# Patient Record
Sex: Male | Born: 1935
Health system: Southern US, Community
[De-identification: ages and names within clinical notes are randomized; demographics above are authoritative.]

## PROBLEM LIST (undated history)

## (undated) DIAGNOSIS — I519 Heart disease, unspecified: Secondary | ICD-10-CM

## (undated) DIAGNOSIS — R350 Frequency of micturition: Secondary | ICD-10-CM

## (undated) DIAGNOSIS — R251 Tremor, unspecified: Secondary | ICD-10-CM

## (undated) DIAGNOSIS — C801 Malignant (primary) neoplasm, unspecified: Secondary | ICD-10-CM

## (undated) DIAGNOSIS — M19011 Primary osteoarthritis, right shoulder: Secondary | ICD-10-CM

## (undated) DIAGNOSIS — N401 Enlarged prostate with lower urinary tract symptoms: Secondary | ICD-10-CM

## (undated) DIAGNOSIS — M25422 Effusion, left elbow: Secondary | ICD-10-CM

## (undated) DIAGNOSIS — I10 Essential (primary) hypertension: Secondary | ICD-10-CM

## (undated) DIAGNOSIS — K219 Gastro-esophageal reflux disease without esophagitis: Secondary | ICD-10-CM

## (undated) DIAGNOSIS — N400 Enlarged prostate without lower urinary tract symptoms: Secondary | ICD-10-CM

## (undated) DIAGNOSIS — E119 Type 2 diabetes mellitus without complications: Secondary | ICD-10-CM

## (undated) DIAGNOSIS — R338 Other retention of urine: Secondary | ICD-10-CM

## (undated) DIAGNOSIS — H269 Unspecified cataract: Secondary | ICD-10-CM

## (undated) DIAGNOSIS — I219 Acute myocardial infarction, unspecified: Secondary | ICD-10-CM

## (undated) HISTORY — PX: HERNIA REPAIR: SHX51

## (undated) HISTORY — DX: Effusion, left elbow: M25.422

## (undated) HISTORY — PX: MOHS SURGERY: SUR867

## (undated) HISTORY — PX: CARDIAC CATHETERIZATION: SHX172

## (undated) HISTORY — PX: OTHER SURGICAL HISTORY: SHX169

## (undated) HISTORY — PX: PROSTATE BIOPSY: SHX241

## (undated) HISTORY — DX: Heart disease, unspecified: I51.9

## (undated) HISTORY — DX: Acute myocardial infarction, unspecified: I21.9

## (undated) HISTORY — DX: Type 2 diabetes mellitus without complications: E11.9

## (undated) HISTORY — DX: Unspecified cataract: H26.9

---

## 1999-07-05 ENCOUNTER — Other Ambulatory Visit: Admission: RE | Admit: 1999-07-05 | Discharge: 1999-07-05 | Payer: Self-pay | Admitting: Podiatry

## 2001-10-19 ENCOUNTER — Ambulatory Visit (HOSPITAL_COMMUNITY): Admission: RE | Admit: 2001-10-19 | Discharge: 2001-10-19 | Payer: Self-pay | Admitting: Gastroenterology

## 2001-10-19 ENCOUNTER — Encounter (INDEPENDENT_AMBULATORY_CARE_PROVIDER_SITE_OTHER): Payer: Self-pay | Admitting: *Deleted

## 2005-10-17 ENCOUNTER — Encounter: Admission: RE | Admit: 2005-10-17 | Discharge: 2005-10-17 | Payer: Self-pay | Admitting: Cardiology

## 2005-10-18 ENCOUNTER — Encounter: Admission: RE | Admit: 2005-10-18 | Discharge: 2005-10-18 | Payer: Self-pay | Admitting: Cardiology

## 2005-10-24 ENCOUNTER — Ambulatory Visit (HOSPITAL_COMMUNITY): Admission: RE | Admit: 2005-10-24 | Discharge: 2005-10-24 | Payer: Self-pay | Admitting: Cardiology

## 2007-10-12 ENCOUNTER — Ambulatory Visit (HOSPITAL_COMMUNITY): Admission: RE | Admit: 2007-10-12 | Discharge: 2007-10-12 | Payer: Self-pay | Admitting: Cardiology

## 2008-08-16 ENCOUNTER — Encounter: Admission: RE | Admit: 2008-08-16 | Discharge: 2008-08-16 | Payer: Self-pay | Admitting: Internal Medicine

## 2011-04-02 NOTE — Cardiovascular Report (Signed)
NAME:  Barnett, George NO.:  [id number]   MEDICAL RECORD NO.:  [id number]          PATIENT TYPE:  OIB   LOCATION:  2852                         FACILITY:  [facility]   PHYSICIAN:  Ezzard Standing, M.D.DATE OF BIRTH:  01/12/1933   DATE OF PROCEDURE:  10/12/2007  DATE OF DISCHARGE:                            CARDIAC CATHETERIZATION   HISTORY:  The patient is a 77 year old diabetic male who has a previous  history of a previous anterior infarction following treadmill testing.  He had acute stent in 1999.  He presents with discomfort in his chest  similar to the previous discomfort that he had at the time of his  infarction.  It occurred in a crescendo fashion.  EKG was unremarkable.   PROCEDURE:  Left heart catheterization with coronary angiograms and left  ventriculogram.   COMMENTS ABOUT THE PROCEDURE:  The patient tolerated the procedure well  without complications.  Following the procedure, he was taken to the  holding area for sheath removal.  The right femoral artery was entered  using a single anterior needle wall stick.   HEMODYNAMIC DATA:  Review of data on pullback showed aortic pressure of  120/50.  The left ventricle was 129/3-9.   ANGIOGRAPHIC DATA:  Left ventriculogram:  Performed in the 30 degrees  RAO projection.  The aortic valve is normal.  The mitral valve is  normal.  The left ventricle appears hyperdynamic.  Estimated ejection  fraction is 70%.  Coronary arteries arise and distribute normally.  There is calcification noted involving the left coronary system.  The  left main coronary artery appears normal.  The left anterior descending  has an eccentric 40% proximal stenosis with an area of inferior  calcification.  This is followed by an area of calcification, and then  another area of 40% stenosis prior to the previously placed stent.  There is mild diffuse narrowing within the stent.  Two diagonal branches  arise.  The circumflex coronary  artery has a single marginal artery and  is free of disease.  The right coronary artery is a dominant vessel  supplying a posterior descending and posterolateral branch and contains  only scattered irregularities.   IMPRESSION:  1. Widely patent stent to the LAD and moderate proximal disease      involving the LAD which is not thought to be severely obstructive.      No significant disease identified in other vessels.  2. Hyperdynamic left ventricular function.  3. Very mild gradient across left ventricular outflow tract and aortic      valve   RECOMMENDATIONS:  Other causes of pain will be assessed.  He will also  have an echocardiogram to assess the LV function and aortic valve.      Ezzard Standing, M.D.  Electronically Signed     WST/MEDQ  D:  10/12/2007  T:  10/12/2007  Job:  [id number]   cc:   Arlyss Repress, MD

## 2011-04-05 NOTE — Procedures (Signed)
George Barnett. [facility]  Patient:    MOSHEH, George Barnett Visit Number: [id number] MRN: [id number]          Service Type: END Location: ENDO Attending Physician:  Orvis Brill Dictated by:   Jeryl Columbia, M.D. Proc. Date: 10/19/01 Admit Date:  10/19/2001   CC:         Theodoro Parma. Francina Ames., M.D.   Procedure Report  PROCEDURE:  Colonoscopy with biopsy.  INDICATION:  Patient with a history of colon polyps, although all hyperplastic.  Due for repeat screening.  Consent was signed after risks, benefits, methods, and options thoroughly discussed in the past on multiple occasions.  MEDICATIONS:  Demerol 75 mg, Versed 7.5 mg.  DESCRIPTION OF PROCEDURE:  Rectal inspection was pertinent for external hemorrhoids.  Digital exam was negative.  Video colonoscope was inserted, easily advanced around the colon to the cecum.  This did not require any abdominal pressure or any position changes.  The cecum was identified by the appendiceal orifice and the ileocecal valve.  The scope was slowly withdrawn. The cecum, ascending, and transverse were normal.  Then the scope was withdrawn down the left side of the colon.  There were some rare early left-sided diverticula and two tiny questionable polyps, one in the distal sigmoid, one in the mid-descending were seen and each cold biopsied x 1 or 2 and put in the same container.  No other abnormalities were seen as we slowly withdrew back to the rectum.  Once back in the rectum the scope was retroflexed, pertinent for some small internal hemorrhoids.  The scope was straightened and readvanced a short way up the sigmoid.  Air was suctioned and scope removed.  The patient tolerated the procedure well.  There was no obvious immediate complication.  ENDOSCOPIC DIAGNOSES: 1. Internal-external small hemorrhoids. 2. Early left-sided occasional diverticula. 3. Two tiny left-side questionable polyps. 4. Otherwise within  normal limits to the cecum.  PLAN:  Await pathology but probably recheck colon screening in five years. Happy to see back p.r.n.  Otherwise return care to Dr. Conley Canal for the customary health care maintenance, to include yearly rectals and guaiacs. Dictated by:   Jeryl Columbia, M.D. Attending Physician:  Orvis Brill DD:  10/19/01 TD:  10/19/01 Job: [id number]

## 2011-04-05 NOTE — Cardiovascular Report (Signed)
NAME:  George Barnett, George Barnett NO.:  [id number]   MEDICAL RECORD NO.:  [id number]          PATIENT TYPE:  OIB   LOCATION:  2899                         FACILITY:  [facility]   PHYSICIAN:  Ezzard Standing, M.D.DATE OF BIRTH:  06-May-1933   DATE OF PROCEDURE:  10/24/2005  DATE OF DISCHARGE:                              CARDIAC CATHETERIZATION   HISTORY OF PRESENT ILLNESS:  This 77 year old male with a known history of  coronary artery disease with previous stent of the LAD. In 1999 he presented  with some chest discomfort as well as a near syncopal episode and has an  abnormal Cardiolite stress test.   PROCEDURE:  Left heart catheterization with coronary angiograms and left  ventriculogram,.   DESCRIPTION OF PROCEDURE:  The right femoral artery was entered using a  single anterior needle wall stick.  The procedure was done using 6-French  catheters without complications.  Following the procedure he was taken to  the holding area for manual compression of the artery.  Hemodynamic data:  Aorta post contrast 120/52, LV postcontrast 120/ 2-11. Angiographic data:  Left ventriculogram:  Performed in the 30 degree RAO projection.  The aortic  valve was normal.  The mitral valve was normal.  The left ventricle appears  normal in size.  The estimated ejection fraction is 65%.  Coronary arteries  rise and distribute normally.  There is calcification noted involving the  proximal LAD.  The left main coronary artery appears normal.  There is mild  calcification noted.  The left anterior descending is calcified proximally  with an eccentric 30-40% proximal stenosis, a 30-40% stenosis prior to the  stent, mild diffuse in-stent restenosis that is nonobstructive.  There is no  severe focal obstructive stenoses noted.  Circumflex coronary artery:  Single marginal artery that has some branching portions to it distally  contains 30-40% irregularity but no significant focal obstructive stenoses  were noted.  Right coronary artery:  Dominant vessel.  There is mild to  moderate irregularity in the mid portion of vessel.  No significant focal  obstructive stenoses were noted.  The vessel is a dominant vessel supplying  several smaller posterior descending and posterolateral branches.   IMPRESSION:  1.  Widely patent stent involving the mid LAD.  2. Residual atherosclerosis      involving the LAD with calcification and moderate disease proximally,      minimal disease in other vessels.  2. Normal LV function.   RECOMMENDATIONS:  Reassurance at this time.  Continued medical therapy and  evaluation for other causes of symptoms.      Ezzard Standing, M.D.  Electronically Signed     WST/MEDQ  D:  10/24/2005  T:  10/24/2005  Job:  [id number]   cc:   Theodoro Parma. Francina Ames., M.D.  Fax: [phone]

## 2011-10-09 LAB — PSA: PSA: 1.09

## 2012-01-30 DIAGNOSIS — E7800 Pure hypercholesterolemia, unspecified: Secondary | ICD-10-CM | POA: Diagnosis not present

## 2012-01-30 DIAGNOSIS — I10 Essential (primary) hypertension: Secondary | ICD-10-CM | POA: Diagnosis not present

## 2012-01-30 DIAGNOSIS — E119 Type 2 diabetes mellitus without complications: Secondary | ICD-10-CM | POA: Diagnosis not present

## 2012-02-04 DIAGNOSIS — E785 Hyperlipidemia, unspecified: Secondary | ICD-10-CM | POA: Diagnosis not present

## 2012-02-04 DIAGNOSIS — R259 Unspecified abnormal involuntary movements: Secondary | ICD-10-CM | POA: Diagnosis not present

## 2012-02-04 DIAGNOSIS — R609 Edema, unspecified: Secondary | ICD-10-CM | POA: Diagnosis not present

## 2012-02-04 DIAGNOSIS — E119 Type 2 diabetes mellitus without complications: Secondary | ICD-10-CM | POA: Diagnosis not present

## 2012-02-04 DIAGNOSIS — I10 Essential (primary) hypertension: Secondary | ICD-10-CM | POA: Diagnosis not present

## 2012-03-09 DIAGNOSIS — L219 Seborrheic dermatitis, unspecified: Secondary | ICD-10-CM | POA: Diagnosis not present

## 2012-03-09 DIAGNOSIS — L820 Inflamed seborrheic keratosis: Secondary | ICD-10-CM | POA: Diagnosis not present

## 2012-03-09 DIAGNOSIS — L570 Actinic keratosis: Secondary | ICD-10-CM | POA: Diagnosis not present

## 2012-04-22 DIAGNOSIS — J329 Chronic sinusitis, unspecified: Secondary | ICD-10-CM | POA: Diagnosis not present

## 2012-05-12 DIAGNOSIS — I2510 Atherosclerotic heart disease of native coronary artery without angina pectoris: Secondary | ICD-10-CM | POA: Diagnosis not present

## 2012-05-14 ENCOUNTER — Other Ambulatory Visit: Payer: Self-pay | Admitting: Gastroenterology

## 2012-05-14 DIAGNOSIS — R109 Unspecified abdominal pain: Secondary | ICD-10-CM

## 2012-05-14 DIAGNOSIS — R634 Abnormal weight loss: Secondary | ICD-10-CM | POA: Diagnosis not present

## 2012-05-14 DIAGNOSIS — K5900 Constipation, unspecified: Secondary | ICD-10-CM | POA: Diagnosis not present

## 2012-05-14 DIAGNOSIS — R633 Feeding difficulties: Secondary | ICD-10-CM | POA: Diagnosis not present

## 2012-05-15 ENCOUNTER — Ambulatory Visit
Admission: RE | Admit: 2012-05-15 | Discharge: 2012-05-15 | Disposition: A | Discharge status: Home / Self Care | Payer: Medicare Other | Source: Ambulatory Visit | Attending: Gastroenterology | Admitting: Gastroenterology

## 2012-05-15 DIAGNOSIS — N281 Cyst of kidney, acquired: Secondary | ICD-10-CM | POA: Diagnosis not present

## 2012-05-15 DIAGNOSIS — I7090 Unspecified atherosclerosis: Secondary | ICD-10-CM | POA: Diagnosis not present

## 2012-05-15 DIAGNOSIS — R109 Unspecified abdominal pain: Secondary | ICD-10-CM

## 2012-05-15 DIAGNOSIS — K6389 Other specified diseases of intestine: Secondary | ICD-10-CM | POA: Diagnosis not present

## 2012-05-15 MED ORDER — IOHEXOL 300 MG/ML  SOLN
100.0000 mL | Freq: Once | INTRAMUSCULAR | Status: AC | PRN
  Administered 2012-05-15: 100 mL via INTRAVENOUS

## 2012-05-15 MED ORDER — IOHEXOL 300 MG/ML  SOLN: 75.0000 mL | Freq: Once | INTRAMUSCULAR | Status: DC | PRN

## 2012-06-01 DIAGNOSIS — R634 Abnormal weight loss: Secondary | ICD-10-CM | POA: Diagnosis not present

## 2012-06-01 DIAGNOSIS — Z125 Encounter for screening for malignant neoplasm of prostate: Secondary | ICD-10-CM | POA: Diagnosis not present

## 2012-06-03 DIAGNOSIS — H43819 Vitreous degeneration, unspecified eye: Secondary | ICD-10-CM | POA: Diagnosis not present

## 2012-06-03 DIAGNOSIS — H259 Unspecified age-related cataract: Secondary | ICD-10-CM | POA: Diagnosis not present

## 2012-06-03 DIAGNOSIS — H04219 Epiphora due to excess lacrimation, unspecified lacrimal gland: Secondary | ICD-10-CM | POA: Diagnosis not present

## 2012-06-03 DIAGNOSIS — E119 Type 2 diabetes mellitus without complications: Secondary | ICD-10-CM | POA: Diagnosis not present

## 2012-06-22 DIAGNOSIS — L905 Scar conditions and fibrosis of skin: Secondary | ICD-10-CM | POA: Diagnosis not present

## 2012-06-22 DIAGNOSIS — L851 Acquired keratosis [keratoderma] palmaris et plantaris: Secondary | ICD-10-CM | POA: Diagnosis not present

## 2012-06-22 DIAGNOSIS — L219 Seborrheic dermatitis, unspecified: Secondary | ICD-10-CM | POA: Diagnosis not present

## 2012-06-22 DIAGNOSIS — D485 Neoplasm of uncertain behavior of skin: Secondary | ICD-10-CM | POA: Diagnosis not present

## 2012-06-22 DIAGNOSIS — L738 Other specified follicular disorders: Secondary | ICD-10-CM | POA: Diagnosis not present

## 2012-06-22 DIAGNOSIS — L570 Actinic keratosis: Secondary | ICD-10-CM | POA: Diagnosis not present

## 2012-06-22 DIAGNOSIS — L821 Other seborrheic keratosis: Secondary | ICD-10-CM | POA: Diagnosis not present

## 2012-07-01 DIAGNOSIS — E119 Type 2 diabetes mellitus without complications: Secondary | ICD-10-CM | POA: Diagnosis not present

## 2012-07-01 DIAGNOSIS — R634 Abnormal weight loss: Secondary | ICD-10-CM | POA: Diagnosis not present

## 2012-08-05 DIAGNOSIS — L821 Other seborrheic keratosis: Secondary | ICD-10-CM | POA: Diagnosis not present

## 2012-08-05 DIAGNOSIS — L570 Actinic keratosis: Secondary | ICD-10-CM | POA: Diagnosis not present

## 2012-08-07 DIAGNOSIS — I10 Essential (primary) hypertension: Secondary | ICD-10-CM | POA: Diagnosis not present

## 2012-08-07 DIAGNOSIS — E119 Type 2 diabetes mellitus without complications: Secondary | ICD-10-CM | POA: Diagnosis not present

## 2012-08-11 DIAGNOSIS — R933 Abnormal findings on diagnostic imaging of other parts of digestive tract: Secondary | ICD-10-CM | POA: Diagnosis not present

## 2012-08-11 DIAGNOSIS — Z86010 Personal history of colonic polyps: Secondary | ICD-10-CM | POA: Diagnosis not present

## 2012-08-11 DIAGNOSIS — K5900 Constipation, unspecified: Secondary | ICD-10-CM | POA: Diagnosis not present

## 2012-08-11 DIAGNOSIS — R634 Abnormal weight loss: Secondary | ICD-10-CM | POA: Diagnosis not present

## 2012-08-13 DIAGNOSIS — I498 Other specified cardiac arrhythmias: Secondary | ICD-10-CM | POA: Diagnosis not present

## 2012-08-13 DIAGNOSIS — E119 Type 2 diabetes mellitus without complications: Secondary | ICD-10-CM | POA: Diagnosis not present

## 2012-08-13 DIAGNOSIS — E7800 Pure hypercholesterolemia, unspecified: Secondary | ICD-10-CM | POA: Diagnosis not present

## 2012-08-13 DIAGNOSIS — Z23 Encounter for immunization: Secondary | ICD-10-CM | POA: Diagnosis not present

## 2012-08-13 DIAGNOSIS — I10 Essential (primary) hypertension: Secondary | ICD-10-CM | POA: Diagnosis not present

## 2012-09-02 DIAGNOSIS — L259 Unspecified contact dermatitis, unspecified cause: Secondary | ICD-10-CM | POA: Diagnosis not present

## 2012-09-02 DIAGNOSIS — L570 Actinic keratosis: Secondary | ICD-10-CM | POA: Diagnosis not present

## 2012-09-09 DIAGNOSIS — R198 Other specified symptoms and signs involving the digestive system and abdomen: Secondary | ICD-10-CM | POA: Diagnosis not present

## 2012-09-09 DIAGNOSIS — K5730 Diverticulosis of large intestine without perforation or abscess without bleeding: Secondary | ICD-10-CM | POA: Diagnosis not present

## 2012-09-09 DIAGNOSIS — Z09 Encounter for follow-up examination after completed treatment for conditions other than malignant neoplasm: Secondary | ICD-10-CM | POA: Diagnosis not present

## 2012-09-09 DIAGNOSIS — R1084 Generalized abdominal pain: Secondary | ICD-10-CM | POA: Diagnosis not present

## 2012-09-09 DIAGNOSIS — Z86010 Personal history of colonic polyps: Secondary | ICD-10-CM | POA: Diagnosis not present

## 2012-10-28 DIAGNOSIS — N281 Cyst of kidney, acquired: Secondary | ICD-10-CM | POA: Diagnosis not present

## 2012-10-28 DIAGNOSIS — N400 Enlarged prostate without lower urinary tract symptoms: Secondary | ICD-10-CM | POA: Diagnosis not present

## 2012-10-28 DIAGNOSIS — N529 Male erectile dysfunction, unspecified: Secondary | ICD-10-CM | POA: Diagnosis not present

## 2012-11-30 DIAGNOSIS — L570 Actinic keratosis: Secondary | ICD-10-CM | POA: Diagnosis not present

## 2012-11-30 DIAGNOSIS — L218 Other seborrheic dermatitis: Secondary | ICD-10-CM | POA: Diagnosis not present

## 2013-02-11 DIAGNOSIS — Z0000 Encounter for general adult medical examination without abnormal findings: Secondary | ICD-10-CM | POA: Diagnosis not present

## 2013-02-11 DIAGNOSIS — I10 Essential (primary) hypertension: Secondary | ICD-10-CM | POA: Diagnosis not present

## 2013-02-11 DIAGNOSIS — Z125 Encounter for screening for malignant neoplasm of prostate: Secondary | ICD-10-CM | POA: Diagnosis not present

## 2013-02-11 DIAGNOSIS — E119 Type 2 diabetes mellitus without complications: Secondary | ICD-10-CM | POA: Diagnosis not present

## 2013-02-16 DIAGNOSIS — R259 Unspecified abnormal involuntary movements: Secondary | ICD-10-CM | POA: Diagnosis not present

## 2013-02-16 DIAGNOSIS — E119 Type 2 diabetes mellitus without complications: Secondary | ICD-10-CM | POA: Diagnosis not present

## 2013-02-16 DIAGNOSIS — Z23 Encounter for immunization: Secondary | ICD-10-CM | POA: Diagnosis not present

## 2013-02-16 DIAGNOSIS — I10 Essential (primary) hypertension: Secondary | ICD-10-CM | POA: Diagnosis not present

## 2013-02-16 DIAGNOSIS — E7800 Pure hypercholesterolemia, unspecified: Secondary | ICD-10-CM | POA: Diagnosis not present

## 2013-02-19 DIAGNOSIS — J310 Chronic rhinitis: Secondary | ICD-10-CM | POA: Diagnosis not present

## 2013-02-19 DIAGNOSIS — J342 Deviated nasal septum: Secondary | ICD-10-CM | POA: Diagnosis not present

## 2013-02-19 DIAGNOSIS — J330 Polyp of nasal cavity: Secondary | ICD-10-CM | POA: Diagnosis not present

## 2013-05-11 DIAGNOSIS — I2510 Atherosclerotic heart disease of native coronary artery without angina pectoris: Secondary | ICD-10-CM | POA: Diagnosis not present

## 2013-05-11 DIAGNOSIS — E119 Type 2 diabetes mellitus without complications: Secondary | ICD-10-CM | POA: Diagnosis not present

## 2013-05-11 DIAGNOSIS — E785 Hyperlipidemia, unspecified: Secondary | ICD-10-CM | POA: Diagnosis not present

## 2013-05-11 DIAGNOSIS — I252 Old myocardial infarction: Secondary | ICD-10-CM | POA: Diagnosis not present

## 2013-05-11 DIAGNOSIS — R0789 Other chest pain: Secondary | ICD-10-CM | POA: Diagnosis not present

## 2013-06-07 DIAGNOSIS — E119 Type 2 diabetes mellitus without complications: Secondary | ICD-10-CM | POA: Diagnosis not present

## 2013-06-07 DIAGNOSIS — H04129 Dry eye syndrome of unspecified lacrimal gland: Secondary | ICD-10-CM | POA: Diagnosis not present

## 2013-06-07 DIAGNOSIS — H259 Unspecified age-related cataract: Secondary | ICD-10-CM | POA: Diagnosis not present

## 2013-06-07 DIAGNOSIS — H16109 Unspecified superficial keratitis, unspecified eye: Secondary | ICD-10-CM | POA: Diagnosis not present

## 2013-06-11 DIAGNOSIS — I10 Essential (primary) hypertension: Secondary | ICD-10-CM | POA: Diagnosis not present

## 2013-06-11 DIAGNOSIS — E119 Type 2 diabetes mellitus without complications: Secondary | ICD-10-CM | POA: Diagnosis not present

## 2013-06-14 DIAGNOSIS — C44320 Squamous cell carcinoma of skin of unspecified parts of face: Secondary | ICD-10-CM | POA: Diagnosis not present

## 2013-06-14 DIAGNOSIS — L570 Actinic keratosis: Secondary | ICD-10-CM | POA: Diagnosis not present

## 2013-06-14 DIAGNOSIS — L821 Other seborrheic keratosis: Secondary | ICD-10-CM | POA: Diagnosis not present

## 2013-06-14 DIAGNOSIS — D485 Neoplasm of uncertain behavior of skin: Secondary | ICD-10-CM | POA: Diagnosis not present

## 2013-06-14 DIAGNOSIS — R238 Other skin changes: Secondary | ICD-10-CM | POA: Diagnosis not present

## 2013-06-18 DIAGNOSIS — I2510 Atherosclerotic heart disease of native coronary artery without angina pectoris: Secondary | ICD-10-CM | POA: Diagnosis not present

## 2013-06-18 DIAGNOSIS — I10 Essential (primary) hypertension: Secondary | ICD-10-CM | POA: Diagnosis not present

## 2013-06-18 DIAGNOSIS — E119 Type 2 diabetes mellitus without complications: Secondary | ICD-10-CM | POA: Diagnosis not present

## 2013-06-18 DIAGNOSIS — E7800 Pure hypercholesterolemia, unspecified: Secondary | ICD-10-CM | POA: Diagnosis not present

## 2013-07-02 DIAGNOSIS — C44320 Squamous cell carcinoma of skin of unspecified parts of face: Secondary | ICD-10-CM | POA: Diagnosis not present

## 2013-07-02 DIAGNOSIS — L570 Actinic keratosis: Secondary | ICD-10-CM | POA: Diagnosis not present

## 2013-07-20 DIAGNOSIS — L259 Unspecified contact dermatitis, unspecified cause: Secondary | ICD-10-CM | POA: Diagnosis not present

## 2013-08-04 DIAGNOSIS — Z85828 Personal history of other malignant neoplasm of skin: Secondary | ICD-10-CM | POA: Diagnosis not present

## 2013-08-04 DIAGNOSIS — Z23 Encounter for immunization: Secondary | ICD-10-CM | POA: Diagnosis not present

## 2013-08-11 DIAGNOSIS — N390 Urinary tract infection, site not specified: Secondary | ICD-10-CM | POA: Diagnosis not present

## 2013-08-18 DIAGNOSIS — N390 Urinary tract infection, site not specified: Secondary | ICD-10-CM | POA: Diagnosis not present

## 2013-09-20 DIAGNOSIS — E119 Type 2 diabetes mellitus without complications: Secondary | ICD-10-CM | POA: Diagnosis not present

## 2013-09-20 DIAGNOSIS — I10 Essential (primary) hypertension: Secondary | ICD-10-CM | POA: Diagnosis not present

## 2013-09-27 DIAGNOSIS — I10 Essential (primary) hypertension: Secondary | ICD-10-CM | POA: Diagnosis not present

## 2013-09-27 DIAGNOSIS — E7800 Pure hypercholesterolemia, unspecified: Secondary | ICD-10-CM | POA: Diagnosis not present

## 2013-09-27 DIAGNOSIS — R350 Frequency of micturition: Secondary | ICD-10-CM | POA: Diagnosis not present

## 2013-09-27 DIAGNOSIS — E119 Type 2 diabetes mellitus without complications: Secondary | ICD-10-CM | POA: Diagnosis not present

## 2013-10-29 DIAGNOSIS — R351 Nocturia: Secondary | ICD-10-CM | POA: Diagnosis not present

## 2013-10-29 DIAGNOSIS — N139 Obstructive and reflux uropathy, unspecified: Secondary | ICD-10-CM | POA: Diagnosis not present

## 2013-10-29 DIAGNOSIS — N401 Enlarged prostate with lower urinary tract symptoms: Secondary | ICD-10-CM | POA: Diagnosis not present

## 2013-10-29 DIAGNOSIS — Q6101 Congenital single renal cyst: Secondary | ICD-10-CM | POA: Diagnosis not present

## 2013-12-21 DIAGNOSIS — L570 Actinic keratosis: Secondary | ICD-10-CM | POA: Diagnosis not present

## 2013-12-21 DIAGNOSIS — D485 Neoplasm of uncertain behavior of skin: Secondary | ICD-10-CM | POA: Diagnosis not present

## 2013-12-31 DIAGNOSIS — E119 Type 2 diabetes mellitus without complications: Secondary | ICD-10-CM | POA: Diagnosis not present

## 2014-01-03 DIAGNOSIS — Z23 Encounter for immunization: Secondary | ICD-10-CM | POA: Diagnosis not present

## 2014-01-05 DIAGNOSIS — R351 Nocturia: Secondary | ICD-10-CM | POA: Diagnosis not present

## 2014-01-05 DIAGNOSIS — N401 Enlarged prostate with lower urinary tract symptoms: Secondary | ICD-10-CM | POA: Diagnosis not present

## 2014-01-05 DIAGNOSIS — Q6101 Congenital single renal cyst: Secondary | ICD-10-CM | POA: Diagnosis not present

## 2014-02-15 DIAGNOSIS — M25519 Pain in unspecified shoulder: Secondary | ICD-10-CM | POA: Diagnosis not present

## 2014-02-15 DIAGNOSIS — M19019 Primary osteoarthritis, unspecified shoulder: Secondary | ICD-10-CM | POA: Diagnosis not present

## 2014-03-07 ENCOUNTER — Ambulatory Visit (INDEPENDENT_AMBULATORY_CARE_PROVIDER_SITE_OTHER): Payer: Medicare Other | Admitting: Family Medicine

## 2014-03-07 VITALS — BP 100/62 | HR 63 | Temp 98.0°F | Resp 16 | Ht 71.0 in | Wt 168.4 lb

## 2014-03-07 DIAGNOSIS — R252 Cramp and spasm: Secondary | ICD-10-CM

## 2014-03-07 DIAGNOSIS — R110 Nausea: Secondary | ICD-10-CM

## 2014-03-07 LAB — POCT CBC
Granulocyte percent: 66.8 %G (ref 37–80)
HCT, POC: 38.1 % — AB (ref 43.5–53.7)
Hemoglobin: 12.2 g/dL — AB (ref 14.1–18.1)
Lymph, poc: 1.7 (ref 0.6–3.4)
MCH, POC: 33 pg — AB (ref 27–31.2)
MCHC: 32 g/dL (ref 31.8–35.4)
MCV: 102.9 fL — AB (ref 80–97)
MID (cbc): 0.5 (ref 0–0.9)
MPV: 11.8 fL (ref 0–99.8)
POC Granulocyte: 4.5 (ref 2–6.9)
POC LYMPH PERCENT: 25.2 %L (ref 10–50)
POC MID %: 8 %M (ref 0–12)
Platelet Count, POC: 186 10*3/uL (ref 142–424)
RBC: 3.7 M/uL — AB (ref 4.69–6.13)
RDW, POC: 12.9 %
WBC: 6.7 10*3/uL (ref 4.6–10.2)

## 2014-03-07 LAB — COMPREHENSIVE METABOLIC PANEL
ALT: 13 U/L (ref 0–53)
AST: 21 U/L (ref 0–37)
Albumin: 4 g/dL (ref 3.5–5.2)
Alkaline Phosphatase: 40 U/L (ref 39–117)
BUN: 23 mg/dL (ref 6–23)
CO2: 24 mEq/L (ref 19–32)
Calcium: 8.7 mg/dL (ref 8.4–10.5)
Chloride: 105 mEq/L (ref 96–112)
Creat: 1.23 mg/dL (ref 0.50–1.35)
Glucose, Bld: 94 mg/dL (ref 70–99)
Potassium: 4.1 mEq/L (ref 3.5–5.3)
Sodium: 140 mEq/L (ref 135–145)
Total Bilirubin: 0.3 mg/dL (ref 0.2–1.2)
Total Protein: 6.1 g/dL (ref 6.0–8.3)

## 2014-03-07 MED ORDER — ONDANSETRON 4 MG PO TBDP: 4.0000 mg | ORAL_TABLET | Freq: Three times a day (TID) | ORAL | Status: DC | PRN

## 2014-03-07 NOTE — Progress Notes (Addendum)
° °  Subjective:    Patient ID: George Barnett, male    DOB: 1933-04-22, 80 y.o.   MRN: [id number]  HPI This chart was scribed for Robyn Haber, MD by Ladene Artist, ED Scribe. The patient was seen in room 9. Patient's care was started at 2:43 PM.  HPI Comments: George Barnett is a 80 y.o. male who presents to the Emergency Department complaining of nausea. Pt reports a loss of appetite and decrease in activity onset 2 weeks ago. He also reports associated bloating, flatuence, fatigue and cramping in his extremeties. He denies diarrhea, emesis, abdominal pain and blood in stools.   Pt's Lasix dosages was increased from 20-40 mg a few weeks ago due to edema. He reports improvement in edema.   Pt has recently had a colonoscopy.   No past medical history on file. No past surgical history on file. Allergies  Allergen Reactions   Exenatide     nausea   Meperidine And Related    Nadolol     Numbness in fingers   Sitagliptin Phosphate [Sitagliptin]     Weight loss     Review of Systems  Constitutional: Positive for activity change, appetite change and fatigue.  Gastrointestinal: Positive for nausea. Negative for vomiting, abdominal pain, diarrhea and blood in stool.       Objective:   Physical Exam Is a thin elderly man in no acute distress who appears somewhat pale HEENT: Unremarkable Neck: Supple no adenopathy or thyromegaly, no JVD Chest: Clear to auscultation Heart: Regular no murmur Abdomen: Hyperactive bowel sounds with no HSM or tenderness, no guarding or rebound Skin: No jaundice, no rash or suspicious lesions Extremities: Trace edema in the right lower extremity  Results for orders placed in visit on 03/07/14  POCT CBC      Result Value Ref Range   WBC 6.7  4.6 - 10.2 K/uL   Lymph, poc 1.7  0.6 - 3.4   POC LYMPH PERCENT 25.2  10 - 50 %L   MID (cbc) 0.5  0 - 0.9   POC MID % 8.0  0 - 12 %M   POC Granulocyte 4.5  2 - 6.9   Granulocyte percent 66.8  37 - 80  %G   RBC 3.70 (*) 4.69 - 6.13 M/uL   Hemoglobin 12.2 (*) 14.1 - 18.1 g/dL   HCT, POC 38.1 (*) 43.5 - 53.7 %   MCV 102.9 (*) 80 - 97 fL   MCH, POC 33.0 (*) 27 - 31.2 pg   MCHC 32.0  31.8 - 35.4 g/dL   RDW, POC 12.9     Platelet Count, POC 186  142 - 424 K/uL   MPV 11.8  0 - 99.8 fL        Assessment & Plan:  I personally performed the services described in this documentation, which was scribed in my presence. The recorded information has been reviewed and is accurate.  I don't have a good explanation for this patient's nausea. Perhaps the increased dose of Lasix has resulted in some electrolyte imbalance which is causing the nausea.  I have asked the patient to decrease the Lasix to one half of what is currently taking and will obtain a compressive metabolic profile to followup on the potassium. Muscle cramps - Plan: Comprehensive metabolic panel, POCT CBC  Nausea alone - Plan: Comprehensive metabolic panel, POCT CBC, ondansetron (ZOFRAN ODT) 4 MG disintegrating tablet   Signed, Carola Frost.D.

## 2014-03-07 NOTE — Patient Instructions (Addendum)
I am worried about your being dehydrated which may be a function of too much Lasix.  So, we will check your electrolytes, kidney and liver function  Reduce lasix to one a day Hold the onlygza and metformin for next 24 hours.  We will call you with laboratory results tomorrow

## 2014-03-21 DIAGNOSIS — N400 Enlarged prostate without lower urinary tract symptoms: Secondary | ICD-10-CM | POA: Diagnosis not present

## 2014-03-21 DIAGNOSIS — I10 Essential (primary) hypertension: Secondary | ICD-10-CM | POA: Diagnosis not present

## 2014-03-21 DIAGNOSIS — E119 Type 2 diabetes mellitus without complications: Secondary | ICD-10-CM | POA: Diagnosis not present

## 2014-03-29 DIAGNOSIS — M19019 Primary osteoarthritis, unspecified shoulder: Secondary | ICD-10-CM | POA: Diagnosis not present

## 2014-04-05 DIAGNOSIS — M19019 Primary osteoarthritis, unspecified shoulder: Secondary | ICD-10-CM | POA: Diagnosis not present

## 2014-04-12 DIAGNOSIS — C44211 Basal cell carcinoma of skin of unspecified ear and external auricular canal: Secondary | ICD-10-CM | POA: Diagnosis not present

## 2014-04-12 DIAGNOSIS — L821 Other seborrheic keratosis: Secondary | ICD-10-CM | POA: Diagnosis not present

## 2014-04-12 DIAGNOSIS — D485 Neoplasm of uncertain behavior of skin: Secondary | ICD-10-CM | POA: Diagnosis not present

## 2014-04-15 DIAGNOSIS — I2510 Atherosclerotic heart disease of native coronary artery without angina pectoris: Secondary | ICD-10-CM | POA: Diagnosis not present

## 2014-04-15 DIAGNOSIS — E119 Type 2 diabetes mellitus without complications: Secondary | ICD-10-CM | POA: Diagnosis not present

## 2014-04-15 DIAGNOSIS — Z01810 Encounter for preprocedural cardiovascular examination: Secondary | ICD-10-CM | POA: Diagnosis not present

## 2014-04-15 DIAGNOSIS — I252 Old myocardial infarction: Secondary | ICD-10-CM | POA: Diagnosis not present

## 2014-04-15 DIAGNOSIS — E785 Hyperlipidemia, unspecified: Secondary | ICD-10-CM | POA: Diagnosis not present

## 2014-04-19 DIAGNOSIS — I10 Essential (primary) hypertension: Secondary | ICD-10-CM | POA: Diagnosis not present

## 2014-04-19 DIAGNOSIS — E7800 Pure hypercholesterolemia, unspecified: Secondary | ICD-10-CM | POA: Diagnosis not present

## 2014-04-19 DIAGNOSIS — R634 Abnormal weight loss: Secondary | ICD-10-CM | POA: Diagnosis not present

## 2014-04-19 DIAGNOSIS — E119 Type 2 diabetes mellitus without complications: Secondary | ICD-10-CM | POA: Diagnosis not present

## 2014-04-19 DIAGNOSIS — R259 Unspecified abnormal involuntary movements: Secondary | ICD-10-CM | POA: Diagnosis not present

## 2014-04-27 DIAGNOSIS — K219 Gastro-esophageal reflux disease without esophagitis: Secondary | ICD-10-CM | POA: Diagnosis not present

## 2014-04-27 DIAGNOSIS — R1310 Dysphagia, unspecified: Secondary | ICD-10-CM | POA: Diagnosis not present

## 2014-04-27 DIAGNOSIS — R634 Abnormal weight loss: Secondary | ICD-10-CM | POA: Diagnosis not present

## 2014-04-29 DIAGNOSIS — K449 Diaphragmatic hernia without obstruction or gangrene: Secondary | ICD-10-CM | POA: Diagnosis not present

## 2014-04-29 DIAGNOSIS — R634 Abnormal weight loss: Secondary | ICD-10-CM | POA: Diagnosis not present

## 2014-04-29 DIAGNOSIS — D131 Benign neoplasm of stomach: Secondary | ICD-10-CM | POA: Diagnosis not present

## 2014-04-29 DIAGNOSIS — R1310 Dysphagia, unspecified: Secondary | ICD-10-CM | POA: Diagnosis not present

## 2014-04-29 DIAGNOSIS — R110 Nausea: Secondary | ICD-10-CM | POA: Diagnosis not present

## 2014-04-29 DIAGNOSIS — R1084 Generalized abdominal pain: Secondary | ICD-10-CM | POA: Diagnosis not present

## 2014-05-17 DIAGNOSIS — C44211 Basal cell carcinoma of skin of unspecified ear and external auricular canal: Secondary | ICD-10-CM | POA: Diagnosis not present

## 2014-06-08 DIAGNOSIS — H259 Unspecified age-related cataract: Secondary | ICD-10-CM | POA: Diagnosis not present

## 2014-06-08 DIAGNOSIS — H04129 Dry eye syndrome of unspecified lacrimal gland: Secondary | ICD-10-CM | POA: Diagnosis not present

## 2014-06-08 DIAGNOSIS — H43819 Vitreous degeneration, unspecified eye: Secondary | ICD-10-CM | POA: Diagnosis not present

## 2014-06-08 DIAGNOSIS — E119 Type 2 diabetes mellitus without complications: Secondary | ICD-10-CM | POA: Diagnosis not present

## 2014-07-01 DIAGNOSIS — R141 Gas pain: Secondary | ICD-10-CM | POA: Diagnosis not present

## 2014-07-01 DIAGNOSIS — K5900 Constipation, unspecified: Secondary | ICD-10-CM | POA: Diagnosis not present

## 2014-07-01 DIAGNOSIS — R142 Eructation: Secondary | ICD-10-CM | POA: Diagnosis not present

## 2014-07-04 ENCOUNTER — Ambulatory Visit (INDEPENDENT_AMBULATORY_CARE_PROVIDER_SITE_OTHER): Payer: Medicare Other | Admitting: Family Medicine

## 2014-07-04 VITALS — BP 132/78 | HR 55 | Temp 97.6°F | Resp 18 | Ht 73.0 in | Wt 167.0 lb

## 2014-07-04 DIAGNOSIS — L259 Unspecified contact dermatitis, unspecified cause: Secondary | ICD-10-CM

## 2014-07-04 DIAGNOSIS — L299 Pruritus, unspecified: Secondary | ICD-10-CM

## 2014-07-04 MED ORDER — TRIAMCINOLONE ACETONIDE 0.1 % EX CREA: 1.0000 "application " | TOPICAL_CREAM | Freq: Two times a day (BID) | CUTANEOUS | Status: DC

## 2014-07-04 NOTE — Progress Notes (Signed)
[facility] [address] [place] [address] [phone]  Date:  07/04/2014   Name:  George Barnett   DOB:  Dec 16, 1932   MRN:  [id number]  PCP:  No primary provider on file.    Chief Complaint: Mass and removed tick   History of Present Illness:  George Barnett is a 80 y.o. very pleasant male patient who presents with the following:  He is here today with some itchy bumps on his skin which seemed to come up over the last couple of days. The bumps are present in his groin, on his back and a few on his abdomen and left arm. His wife applied some benadryl cream to help with the itching.  She also found what she thinks is a deer tick on his body and pulled it off. Otherwise Jaiyon is feeling well today.  He does not have a fever or other systemic sx.  He does have DM which is well controlled and followed by his PCP.  States that he checks his glucose daily and it is generally 110- 130.   No erythema migrans rash.   There are no active problems to display for this patient.   Past Medical History  Diagnosis Date  . Diabetes mellitus without complication   . Heart disease   . Heart attack   . Cataract     Past Surgical History  Procedure Laterality Date  . Hernia repair    . Prostate biopsy    . Cardio stint      History  Substance Use Topics  . Smoking status: Never Smoker   . Smokeless tobacco: Not on file  . Alcohol Use: Not on file    Family History  Problem Relation Age of Onset  . Hypertension Brother     Allergies  Allergen Reactions  . Exenatide     nausea  . Meperidine And Related   . Nadolol     Numbness in fingers  . Sitagliptin Phosphate [Sitagliptin]     Weight loss     Medication list has been reviewed and updated.  Current Outpatient Prescriptions on File Prior to Visit  Medication Sig Dispense Refill  . aspirin 81 MG tablet Take 81 mg by mouth daily.      Marland Kitchen atorvastatin (LIPITOR) 10 MG tablet Take 10 mg by mouth  daily.      . furosemide (LASIX) 20 MG tablet Take 20 mg by mouth.      Marland Kitchen glimepiride (AMARYL) 2 MG tablet Take 2 mg by mouth daily with breakfast.      . losartan (COZAAR) 50 MG tablet Take 50 mg by mouth daily.      . metoprolol (LOPRESSOR) 50 MG tablet Take 50 mg by mouth 2 (two) times daily.      . Multiple Vitamin (MULTIVITAMIN) tablet Take 1 tablet by mouth daily.      Marland Kitchen OVER THE COUNTER MEDICATION CoQ 10 100 mg capsule 1 po daily      . OVER THE COUNTER MEDICATION Cinnamon 500 mg cap 1 po daily      . OVER THE COUNTER MEDICATION Folic Acid Q000111Q mg tab 1 po daily      . OVER THE COUNTER MEDICATION Vitamin B 12 1000 mcg tab 1 po daily      . saxagliptin HCl (ONGLYZA) 2.5 MG TABS tablet Take 2.5 mg by mouth daily.      . ondansetron (ZOFRAN ODT) 4 MG disintegrating tablet Take 1  tablet (4 mg total) by mouth every 8 (eight) hours as needed for nausea or vomiting.  8 tablet  0  . tamsulosin (FLOMAX) 0.4 MG CAPS capsule Take 0.4 mg by mouth.       No current facility-administered medications on file prior to visit.    Review of Systems:  As per HPI- otherwise negative.   Physical Examination: Filed Vitals:   07/04/14 1044  BP: 132/78  Pulse: 55  Temp: 97.6 F (36.4 C)  Resp: 18   Filed Vitals:   07/04/14 1044  Height: 6\' 1"  (1.854 m)  Weight: 167 lb (75.751 kg)   Body mass index is 22.04 kg/(m^2). Ideal Body Weight: Weight in (lb) to have BMI = 25: 189.1  GEN: WDWN, NAD, Non-toxic, A & O x 3, slim build, looks well.  Here with his wife today HEENT: Atraumatic, Normocephalic. Neck supple. No masses, No LAD.  Marland Kitchenet Ears and Nose: No external deformity. CV: RRR, No M/G/R. No JVD. No thrill. No extra heart sounds. PULM: CTA B, no wheezes, crackles, rhonchi. No retractions. No resp. distress. No accessory muscle use. EXTR: No c/c/e NEURO Normal gait.  PSYCH: Normally interactive. Conversant. Not depressed or anxious appearing.  Calm demeanor.  There are small papules that  appear most consistent with tiny areas of contact derm or rhus dermatitis scattered on his left arm, back, and waistline, and a similar rash that is more excoriated in the groin area   Examined tick that they brought in- does appear to be a deer tick  Assessment and Plan: Itching - Plan: triamcinolone cream (KENALOG) 0.1 %  Contact dermatitis - Plan: triamcinolone cream (KENALOG) 0.1 %  Will treat for itching and contact derm with triamcinolone cream as above.  Try to avoid systemic steroids if possible due to his DM.  Cautioned regarding si/sx of RMSF and Lyme disease.   See patient instructions for more details.    Signed Lamar Blinks, MD

## 2014-07-04 NOTE — Patient Instructions (Signed)
Use the triamcinolone cream as needed for itching- it is also ok to use the benadryl cream.  We can consider oral steroids if you do not get better, but I would prefer to avoid these if possible as they will make your blood sugar go high.    Let me know if you are not feeling better in the next few days- Sooner if worse.

## 2014-07-15 ENCOUNTER — Telehealth: Payer: Self-pay

## 2014-07-15 DIAGNOSIS — I10 Essential (primary) hypertension: Secondary | ICD-10-CM | POA: Diagnosis not present

## 2014-07-15 DIAGNOSIS — E119 Type 2 diabetes mellitus without complications: Secondary | ICD-10-CM | POA: Diagnosis not present

## 2014-07-15 NOTE — Telephone Encounter (Signed)
Called him back but got his wife Blanch Media.  She states that he developed "30 plus bumps" over his body. They also noted a dark red area on his groin, but this now seems to be doing better.  He feels well overall.  They will come in if he gets worse or does not improve but now they are not overly concerned. He no longer has any itching

## 2014-07-15 NOTE — Telephone Encounter (Signed)
Pt of Dr. Lorelei Pont was told to call in if his rash did not get better, rash has gotten much worse. Please advise pt

## 2014-07-19 DIAGNOSIS — E7800 Pure hypercholesterolemia, unspecified: Secondary | ICD-10-CM | POA: Diagnosis not present

## 2014-07-19 DIAGNOSIS — R21 Rash and other nonspecific skin eruption: Secondary | ICD-10-CM | POA: Diagnosis not present

## 2014-07-19 DIAGNOSIS — E119 Type 2 diabetes mellitus without complications: Secondary | ICD-10-CM | POA: Diagnosis not present

## 2014-07-19 DIAGNOSIS — I10 Essential (primary) hypertension: Secondary | ICD-10-CM | POA: Diagnosis not present

## 2014-07-22 DIAGNOSIS — Z85828 Personal history of other malignant neoplasm of skin: Secondary | ICD-10-CM | POA: Diagnosis not present

## 2014-08-15 DIAGNOSIS — K5900 Constipation, unspecified: Secondary | ICD-10-CM | POA: Diagnosis not present

## 2014-08-18 DIAGNOSIS — L821 Other seborrheic keratosis: Secondary | ICD-10-CM | POA: Diagnosis not present

## 2014-08-18 DIAGNOSIS — Z85828 Personal history of other malignant neoplasm of skin: Secondary | ICD-10-CM | POA: Diagnosis not present

## 2014-08-18 DIAGNOSIS — L905 Scar conditions and fibrosis of skin: Secondary | ICD-10-CM | POA: Diagnosis not present

## 2014-08-18 DIAGNOSIS — L570 Actinic keratosis: Secondary | ICD-10-CM | POA: Diagnosis not present

## 2014-08-22 DIAGNOSIS — Z23 Encounter for immunization: Secondary | ICD-10-CM | POA: Diagnosis not present

## 2014-10-25 ENCOUNTER — Other Ambulatory Visit: Payer: Self-pay | Admitting: Orthopedic Surgery

## 2014-10-25 DIAGNOSIS — M19011 Primary osteoarthritis, right shoulder: Secondary | ICD-10-CM | POA: Diagnosis not present

## 2014-10-25 DIAGNOSIS — M25511 Pain in right shoulder: Secondary | ICD-10-CM

## 2014-11-01 ENCOUNTER — Ambulatory Visit
Admission: RE | Admit: 2014-11-01 | Discharge: 2014-11-01 | Disposition: A | Discharge status: Home / Self Care | Payer: Medicare Other | Source: Ambulatory Visit | Attending: Orthopedic Surgery | Admitting: Orthopedic Surgery

## 2014-11-01 DIAGNOSIS — M25511 Pain in right shoulder: Secondary | ICD-10-CM

## 2014-11-01 DIAGNOSIS — M19011 Primary osteoarthritis, right shoulder: Secondary | ICD-10-CM | POA: Diagnosis not present

## 2014-11-22 DIAGNOSIS — I10 Essential (primary) hypertension: Secondary | ICD-10-CM | POA: Insufficient documentation

## 2014-11-22 DIAGNOSIS — R609 Edema, unspecified: Secondary | ICD-10-CM | POA: Insufficient documentation

## 2014-11-22 DIAGNOSIS — E7800 Pure hypercholesterolemia, unspecified: Secondary | ICD-10-CM | POA: Insufficient documentation

## 2014-11-22 DIAGNOSIS — I2510 Atherosclerotic heart disease of native coronary artery without angina pectoris: Secondary | ICD-10-CM | POA: Diagnosis not present

## 2014-11-22 DIAGNOSIS — R600 Localized edema: Secondary | ICD-10-CM | POA: Insufficient documentation

## 2014-11-22 DIAGNOSIS — E119 Type 2 diabetes mellitus without complications: Secondary | ICD-10-CM | POA: Diagnosis not present

## 2014-11-22 HISTORY — DX: Localized edema: R60.0

## 2014-11-22 HISTORY — DX: Edema, unspecified: R60.9

## 2014-11-23 DIAGNOSIS — Z79899 Other long term (current) drug therapy: Secondary | ICD-10-CM | POA: Diagnosis not present

## 2014-11-23 DIAGNOSIS — E119 Type 2 diabetes mellitus without complications: Secondary | ICD-10-CM | POA: Diagnosis not present

## 2014-11-23 DIAGNOSIS — E780 Pure hypercholesterolemia: Secondary | ICD-10-CM | POA: Diagnosis not present

## 2014-12-19 ENCOUNTER — Other Ambulatory Visit: Payer: Self-pay | Admitting: Orthopedic Surgery

## 2015-01-06 DIAGNOSIS — R351 Nocturia: Secondary | ICD-10-CM | POA: Diagnosis not present

## 2015-01-06 DIAGNOSIS — N401 Enlarged prostate with lower urinary tract symptoms: Secondary | ICD-10-CM | POA: Diagnosis not present

## 2015-01-12 ENCOUNTER — Encounter (HOSPITAL_COMMUNITY)
Admission: RE | Admit: 2015-01-12 | Discharge: 2015-01-12 | Disposition: A | Discharge status: Home / Self Care | Payer: Medicare Other | Source: Ambulatory Visit | Attending: Orthopedic Surgery | Admitting: Orthopedic Surgery

## 2015-01-12 ENCOUNTER — Encounter (HOSPITAL_COMMUNITY): Payer: Self-pay

## 2015-01-12 DIAGNOSIS — I252 Old myocardial infarction: Secondary | ICD-10-CM | POA: Diagnosis not present

## 2015-01-12 DIAGNOSIS — Z7982 Long term (current) use of aspirin: Secondary | ICD-10-CM | POA: Insufficient documentation

## 2015-01-12 DIAGNOSIS — K219 Gastro-esophageal reflux disease without esophagitis: Secondary | ICD-10-CM | POA: Diagnosis not present

## 2015-01-12 DIAGNOSIS — Z79899 Other long term (current) drug therapy: Secondary | ICD-10-CM | POA: Diagnosis not present

## 2015-01-12 DIAGNOSIS — I10 Essential (primary) hypertension: Secondary | ICD-10-CM | POA: Insufficient documentation

## 2015-01-12 DIAGNOSIS — Z01818 Encounter for other preprocedural examination: Secondary | ICD-10-CM | POA: Insufficient documentation

## 2015-01-12 DIAGNOSIS — Z01812 Encounter for preprocedural laboratory examination: Secondary | ICD-10-CM | POA: Diagnosis not present

## 2015-01-12 DIAGNOSIS — E119 Type 2 diabetes mellitus without complications: Secondary | ICD-10-CM | POA: Diagnosis not present

## 2015-01-12 DIAGNOSIS — M19011 Primary osteoarthritis, right shoulder: Secondary | ICD-10-CM | POA: Diagnosis not present

## 2015-01-12 DIAGNOSIS — I2510 Atherosclerotic heart disease of native coronary artery without angina pectoris: Secondary | ICD-10-CM | POA: Diagnosis not present

## 2015-01-12 HISTORY — DX: Essential (primary) hypertension: I10

## 2015-01-12 HISTORY — DX: Malignant (primary) neoplasm, unspecified: C80.1

## 2015-01-12 HISTORY — DX: Tremor, unspecified: R25.1

## 2015-01-12 HISTORY — DX: Benign prostatic hyperplasia without lower urinary tract symptoms: N40.0

## 2015-01-12 HISTORY — DX: Gastro-esophageal reflux disease without esophagitis: K21.9

## 2015-01-12 HISTORY — DX: Frequency of micturition: R35.0

## 2015-01-12 LAB — CBC
HCT: 36.6 % — ABNORMAL LOW (ref 39.0–52.0)
Hemoglobin: 12.8 g/dL — ABNORMAL LOW (ref 13.0–17.0)
MCH: 34.7 pg — AB (ref 26.0–34.0)
MCHC: 35 g/dL (ref 30.0–36.0)
MCV: 99.2 fL (ref 78.0–100.0)
PLATELETS: 163 10*3/uL (ref 150–400)
RBC: 3.69 MIL/uL — AB (ref 4.22–5.81)
RDW: 12 % (ref 11.5–15.5)
WBC: 7.2 10*3/uL (ref 4.0–10.5)

## 2015-01-12 LAB — BASIC METABOLIC PANEL
Anion gap: 3 — ABNORMAL LOW (ref 5–15)
BUN: 33 mg/dL — AB (ref 6–23)
CALCIUM: 9 mg/dL (ref 8.4–10.5)
CO2: 29 mmol/L (ref 19–32)
Chloride: 105 mmol/L (ref 96–112)
Creatinine, Ser: 1.46 mg/dL — ABNORMAL HIGH (ref 0.50–1.35)
GFR calc Af Amer: 51 mL/min — ABNORMAL LOW (ref >90)
GFR, EST NON AFRICAN AMERICAN: 44 mL/min — AB (ref >90)
Glucose, Bld: 146 mg/dL — ABNORMAL HIGH (ref 70–99)
Potassium: 4.6 mmol/L (ref 3.5–5.1)
SODIUM: 137 mmol/L (ref 135–145)

## 2015-01-12 LAB — SURGICAL PCR SCREEN
MRSA, PCR: NEGATIVE
STAPHYLOCOCCUS AUREUS: NEGATIVE

## 2015-01-12 NOTE — Progress Notes (Signed)
   01/12/15 1010  OBSTRUCTIVE SLEEP APNEA  Have you ever been diagnosed with sleep apnea through a sleep study? No  Do you snore loudly (loud enough to be heard through closed doors)?  1  Do you often feel tired, fatigued, or sleepy during the daytime? 1  Has anyone observed you stop breathing during your sleep? 1  Do you have, or are you being treated for high blood pressure? 1  BMI more than 35 kg/m2? 0  Age over 79 years old? 1  Neck circumference greater than 40 cm/16 inches? 0  Gender: 1

## 2015-01-12 NOTE — Progress Notes (Signed)
REQUESTED CARDIAC CLEARANCE, STRESS TEST, EKG, ECHO IF DONE FROM DR. TILLEY'S OFFICE.

## 2015-01-13 NOTE — Progress Notes (Signed)
Anesthesia Chart Review:  Patient is a 81 year old male scheduled for right total shoulder arthroplasty on 01/24/15 by Dr. Mardelle Matte.  History includes non-smoker, DM2, CAD/anterior MI s/p LAD stent '99, HTN, BPH, GERD, tremors, skin cancer. PCP is listed as Dr. Derinda Late, last visit 11/22/14 (see Care Everywhere). (His previous PCP was Dr. Maudie Mercury.)  Meds include ASA, Lipitor, Amaryl, HCTZ, Cozaar, metformin, Lopressor, Zantac.  Cardiologist is Dr. Tollie Eth, last visit 04/15/14.  At that time, he felt patient was acceptable to proceed with shoulder surgery without further cardiac testing.   04/15/14 EKG (Dr. Wynonia Lawman): NSR.  12/24/12 One day stress Myoview (Dr. Wynonia Lawman): Normal stress Cardiolite study with no evidence of ischemia or infarction. EF 65% with normal wall motion and normal wall thickening. No evidence of ischemia with exercise testing and is low risk test.   His last cardiac cath was on 10/12/07 (Notes tab) and last echo was on 05/09/10 (Dr. Wynonia Lawman). Echo not received from Dr. Thurman Coyer office--since results were > 3 years, I did not re-request it.   Preoperative labs noted. Cr 1.46, previously 1.23 on 03/07/14 and 1.20 on 11/23/14 (Care Everywhere). Glucose 146 with A1C 6.2 on 11/23/14 (Care Everywhere). H/H 12.8/36.6.  If no acute changes then I anticipate that he can proceed as planned.  Brenn Hugh [facility] Short Stay Center/Anesthesiology Phone [phone] 01/13/2015 12:16 PM

## 2015-01-23 MED ORDER — CEFAZOLIN SODIUM-DEXTROSE 2-3 GM-% IV SOLR
2.0000 g | INTRAVENOUS | Status: AC
  Administered 2015-01-24: 2 g via INTRAVENOUS
  Filled 2015-01-23: qty 50

## 2015-01-23 NOTE — Anesthesia Preprocedure Evaluation (Addendum)
Anesthesia Evaluation  Patient identified by MRN, date of birth, ID band Patient awake    Reviewed: Allergy & Precautions, NPO status , Patient's Chart, lab work & pertinent test results  Airway        Dental   Pulmonary neg pulmonary ROS,          Cardiovascular hypertension, Pt. on medications  STENT 2009, STRESS 2014 normal, EF 65%, being followed by cardio, cleared   Neuro/Psych negative neurological ROS  negative psych ROS   GI/Hepatic Neg liver ROS, GERD-  Medicated,  Endo/Other  diabetes, Type 2, Oral Hypoglycemic Agents  Renal/GU negative Renal ROSCreat 1.46     Musculoskeletal   Abdominal   Peds  Hematology 13/37   Anesthesia Other Findings   Reproductive/Obstetrics                            Anesthesia Physical Anesthesia Plan  ASA: III  Anesthesia Plan: General   Post-op Pain Management: MAC Combined w/ Regional for Post-op pain   Induction: Intravenous  Airway Management Planned: Oral ETT  Additional Equipment:   Intra-op Plan:   Post-operative Plan: Extubation in OR  Informed Consent: I have reviewed the patients History and Physical, chart, labs and discussed the procedure including the risks, benefits and alternatives for the proposed anesthesia with the patient or authorized representative who has indicated his/her understanding and acceptance.     Plan Discussed with:   Anesthesia Plan Comments:         Anesthesia Quick Evaluation

## 2015-01-24 ENCOUNTER — Inpatient Hospital Stay (HOSPITAL_COMMUNITY): Payer: Medicare Other

## 2015-01-24 ENCOUNTER — Encounter (HOSPITAL_COMMUNITY)
Admission: RE | Disposition: A | Discharge status: Home / Self Care | Payer: Self-pay | Source: Ambulatory Visit | Attending: Orthopedic Surgery

## 2015-01-24 ENCOUNTER — Inpatient Hospital Stay (HOSPITAL_COMMUNITY)
Admission: RE | Admit: 2015-01-24 | Discharge: 2015-01-25 | DRG: 483 | Disposition: A | Discharge status: Home / Self Care | Payer: Medicare Other | Source: Ambulatory Visit | Attending: Orthopedic Surgery | Admitting: Orthopedic Surgery

## 2015-01-24 ENCOUNTER — Inpatient Hospital Stay (HOSPITAL_COMMUNITY): Payer: Medicare Other | Admitting: Vascular Surgery

## 2015-01-24 ENCOUNTER — Encounter (HOSPITAL_COMMUNITY): Payer: Self-pay | Admitting: Anesthesiology

## 2015-01-24 ENCOUNTER — Inpatient Hospital Stay (HOSPITAL_COMMUNITY): Payer: Medicare Other | Admitting: Anesthesiology

## 2015-01-24 DIAGNOSIS — Z885 Allergy status to narcotic agent status: Secondary | ICD-10-CM

## 2015-01-24 DIAGNOSIS — R251 Tremor, unspecified: Secondary | ICD-10-CM | POA: Diagnosis present

## 2015-01-24 DIAGNOSIS — R338 Other retention of urine: Secondary | ICD-10-CM | POA: Diagnosis not present

## 2015-01-24 DIAGNOSIS — Z7982 Long term (current) use of aspirin: Secondary | ICD-10-CM

## 2015-01-24 DIAGNOSIS — Z888 Allergy status to other drugs, medicaments and biological substances status: Secondary | ICD-10-CM | POA: Diagnosis not present

## 2015-01-24 DIAGNOSIS — Z85828 Personal history of other malignant neoplasm of skin: Secondary | ICD-10-CM | POA: Diagnosis not present

## 2015-01-24 DIAGNOSIS — M19011 Primary osteoarthritis, right shoulder: Principal | ICD-10-CM | POA: Diagnosis present

## 2015-01-24 DIAGNOSIS — E119 Type 2 diabetes mellitus without complications: Secondary | ICD-10-CM | POA: Diagnosis present

## 2015-01-24 DIAGNOSIS — I10 Essential (primary) hypertension: Secondary | ICD-10-CM | POA: Diagnosis present

## 2015-01-24 DIAGNOSIS — K219 Gastro-esophageal reflux disease without esophagitis: Secondary | ICD-10-CM | POA: Diagnosis present

## 2015-01-24 DIAGNOSIS — Z471 Aftercare following joint replacement surgery: Secondary | ICD-10-CM | POA: Diagnosis not present

## 2015-01-24 DIAGNOSIS — I252 Old myocardial infarction: Secondary | ICD-10-CM | POA: Diagnosis not present

## 2015-01-24 DIAGNOSIS — G8918 Other acute postprocedural pain: Secondary | ICD-10-CM | POA: Diagnosis not present

## 2015-01-24 DIAGNOSIS — Z79899 Other long term (current) drug therapy: Secondary | ICD-10-CM | POA: Diagnosis not present

## 2015-01-24 DIAGNOSIS — Z96611 Presence of right artificial shoulder joint: Secondary | ICD-10-CM | POA: Diagnosis not present

## 2015-01-24 DIAGNOSIS — I519 Heart disease, unspecified: Secondary | ICD-10-CM | POA: Diagnosis present

## 2015-01-24 DIAGNOSIS — R350 Frequency of micturition: Secondary | ICD-10-CM | POA: Diagnosis present

## 2015-01-24 DIAGNOSIS — N401 Enlarged prostate with lower urinary tract symptoms: Secondary | ICD-10-CM | POA: Diagnosis not present

## 2015-01-24 HISTORY — DX: Benign prostatic hyperplasia with lower urinary tract symptoms: N40.1

## 2015-01-24 HISTORY — DX: Other retention of urine: R33.8

## 2015-01-24 HISTORY — DX: Primary osteoarthritis, right shoulder: M19.011

## 2015-01-24 HISTORY — PX: TOTAL SHOULDER ARTHROPLASTY: SHX126

## 2015-01-24 LAB — GLUCOSE, CAPILLARY
Glucose-Capillary: 99 mg/dL (ref 70–99)
Glucose-Capillary: 106 mg/dL — ABNORMAL HIGH (ref 70–99)
Glucose-Capillary: 145 mg/dL — ABNORMAL HIGH (ref 70–99)
Glucose-Capillary: 249 mg/dL — ABNORMAL HIGH (ref 70–99)

## 2015-01-24 SURGERY — ARTHROPLASTY, SHOULDER, TOTAL
Anesthesia: Regional | Site: Shoulder | Laterality: Right

## 2015-01-24 MED ORDER — SUCCINYLCHOLINE CHLORIDE 20 MG/ML IJ SOLN
INTRAMUSCULAR | Status: AC
  Filled 2015-01-24: qty 1

## 2015-01-24 MED ORDER — ATROPINE SULFATE 0.1 MG/ML IJ SOLN
INTRAMUSCULAR | Status: AC
  Filled 2015-01-24: qty 10

## 2015-01-24 MED ORDER — ONDANSETRON HCL 4 MG/2ML IJ SOLN
INTRAMUSCULAR | Status: AC
  Filled 2015-01-24: qty 2

## 2015-01-24 MED ORDER — GLYCOPYRROLATE 0.2 MG/ML IJ SOLN
INTRAMUSCULAR | Status: DC | PRN
  Administered 2015-01-24: 0.2 mg via INTRAVENOUS
  Administered 2015-01-24: 0.4 mg via INTRAVENOUS

## 2015-01-24 MED ORDER — ONDANSETRON HCL 4 MG/2ML IJ SOLN: 4.0000 mg | Freq: Four times a day (QID) | INTRAMUSCULAR | Status: DC | PRN

## 2015-01-24 MED ORDER — HYDROCHLOROTHIAZIDE 25 MG PO TABS
25.0000 mg | ORAL_TABLET | Freq: Every day | ORAL | Status: DC
  Administered 2015-01-24 – 2015-01-25 (×2): 25 mg via ORAL
  Filled 2015-01-24 (×2): qty 1

## 2015-01-24 MED ORDER — ATORVASTATIN CALCIUM 10 MG PO TABS
10.0000 mg | ORAL_TABLET | ORAL | Status: DC
  Administered 2015-01-24: 10 mg via ORAL
  Filled 2015-01-24: qty 1

## 2015-01-24 MED ORDER — LACTATED RINGERS IV SOLN
INTRAVENOUS | Status: DC | PRN
  Administered 2015-01-24: 07:00:00 via INTRAVENOUS

## 2015-01-24 MED ORDER — EPHEDRINE SULFATE 50 MG/ML IJ SOLN
INTRAMUSCULAR | Status: DC | PRN
  Administered 2015-01-24 (×2): 10 mg via INTRAVENOUS
  Administered 2015-01-24 (×2): 5 mg via INTRAVENOUS
  Administered 2015-01-24: 10 mg via INTRAVENOUS
  Administered 2015-01-24: 5 mg via INTRAVENOUS

## 2015-01-24 MED ORDER — SENNA 8.6 MG PO TABS
1.0000 | ORAL_TABLET | Freq: Two times a day (BID) | ORAL | Status: DC
  Administered 2015-01-24 – 2015-01-25 (×3): 8.6 mg via ORAL
  Filled 2015-01-24 (×3): qty 1

## 2015-01-24 MED ORDER — LIDOCAINE HCL (CARDIAC) 20 MG/ML IV SOLN
INTRAVENOUS | Status: AC
  Filled 2015-01-24: qty 5

## 2015-01-24 MED ORDER — ATROPINE SULFATE 0.4 MG/ML IJ SOLN
INTRAMUSCULAR | Status: DC | PRN
  Administered 2015-01-24 (×2): .1 mg via INTRAVENOUS
  Administered 2015-01-24: 0.2 mg via INTRAVENOUS

## 2015-01-24 MED ORDER — PHENYLEPHRINE HCL 10 MG/ML IJ SOLN
10.0000 mg | INTRAMUSCULAR | Status: DC | PRN
  Administered 2015-01-24: 20 ug/min via INTRAVENOUS

## 2015-01-24 MED ORDER — STERILE WATER FOR INJECTION IJ SOLN
INTRAMUSCULAR | Status: AC
  Filled 2015-01-24: qty 10

## 2015-01-24 MED ORDER — FENTANYL CITRATE 0.05 MG/ML IJ SOLN
INTRAMUSCULAR | Status: DC | PRN
  Administered 2015-01-24 (×2): 50 ug via INTRAVENOUS

## 2015-01-24 MED ORDER — ONDANSETRON HCL 4 MG/2ML IJ SOLN
INTRAMUSCULAR | Status: DC | PRN
  Administered 2015-01-24: 4 mg via INTRAVENOUS

## 2015-01-24 MED ORDER — METOPROLOL TARTRATE 25 MG PO TABS
25.0000 mg | ORAL_TABLET | Freq: Two times a day (BID) | ORAL | Status: DC
  Administered 2015-01-25: 25 mg via ORAL
  Filled 2015-01-24: qty 1

## 2015-01-24 MED ORDER — METOCLOPRAMIDE HCL 10 MG PO TABS: 5.0000 mg | ORAL_TABLET | Freq: Three times a day (TID) | ORAL | Status: DC | PRN

## 2015-01-24 MED ORDER — METFORMIN HCL 500 MG PO TABS
1000.0000 mg | ORAL_TABLET | Freq: Every day | ORAL | Status: DC
  Administered 2015-01-24: 1000 mg via ORAL
  Filled 2015-01-24: qty 2

## 2015-01-24 MED ORDER — ACETAMINOPHEN 325 MG PO TABS: 650.0000 mg | ORAL_TABLET | Freq: Four times a day (QID) | ORAL | Status: DC | PRN

## 2015-01-24 MED ORDER — METFORMIN HCL 500 MG PO TABS
500.0000 mg | ORAL_TABLET | Freq: Every day | ORAL | Status: DC
  Administered 2015-01-25: 500 mg via ORAL
  Filled 2015-01-24: qty 1

## 2015-01-24 MED ORDER — LOSARTAN POTASSIUM 25 MG PO TABS
25.0000 mg | ORAL_TABLET | Freq: Every day | ORAL | Status: DC
  Administered 2015-01-24 – 2015-01-25 (×2): 25 mg via ORAL
  Filled 2015-01-24 (×2): qty 1

## 2015-01-24 MED ORDER — ONDANSETRON HCL 4 MG PO TABS: 4.0000 mg | ORAL_TABLET | Freq: Four times a day (QID) | ORAL | Status: DC | PRN

## 2015-01-24 MED ORDER — PHENOL 1.4 % MT LIQD: 1.0000 | OROMUCOSAL | Status: DC | PRN

## 2015-01-24 MED ORDER — SENNA-DOCUSATE SODIUM 8.6-50 MG PO TABS
2.0000 | ORAL_TABLET | Freq: Every day | ORAL | Status: DC
Start: 2015-01-24 — End: 2017-03-25

## 2015-01-24 MED ORDER — ARTIFICIAL TEARS OP OINT
TOPICAL_OINTMENT | OPHTHALMIC | Status: DC | PRN
  Administered 2015-01-24: 1 via OPHTHALMIC

## 2015-01-24 MED ORDER — PHENYLEPHRINE HCL 10 MG/ML IJ SOLN: INTRAMUSCULAR | Status: DC | PRN

## 2015-01-24 MED ORDER — PROPOFOL 10 MG/ML IV BOLUS
INTRAVENOUS | Status: DC | PRN
  Administered 2015-01-24: 140 mg via INTRAVENOUS

## 2015-01-24 MED ORDER — MAGNESIUM CITRATE PO SOLN: 1.0000 | Freq: Once | ORAL | Status: AC | PRN

## 2015-01-24 MED ORDER — POLYETHYLENE GLYCOL 3350 17 G PO PACK: 17.0000 g | PACK | Freq: Every day | ORAL | Status: DC | PRN

## 2015-01-24 MED ORDER — PROPOFOL 10 MG/ML IV BOLUS
INTRAVENOUS | Status: AC
  Filled 2015-01-24: qty 20

## 2015-01-24 MED ORDER — BUPIVACAINE-EPINEPHRINE (PF) 0.5% -1:200000 IJ SOLN
INTRAMUSCULAR | Status: DC | PRN
  Administered 2015-01-24: 20 mL via PERINEURAL

## 2015-01-24 MED ORDER — FENTANYL CITRATE 0.05 MG/ML IJ SOLN
INTRAMUSCULAR | Status: AC
Start: 2015-01-24 — End: 2015-01-24
  Filled 2015-01-24: qty 5

## 2015-01-24 MED ORDER — ATORVASTATIN CALCIUM 10 MG PO TABS: 10.0000 mg | ORAL_TABLET | Freq: Every day | ORAL | Status: DC

## 2015-01-24 MED ORDER — HYDROCODONE-ACETAMINOPHEN 10-325 MG PO TABS
1.0000 | ORAL_TABLET | ORAL | Status: DC | PRN
  Administered 2015-01-24: 1 via ORAL
  Administered 2015-01-25 (×3): 2 via ORAL
  Filled 2015-01-24: qty 2
  Filled 2015-01-24: qty 1
  Filled 2015-01-24 (×2): qty 2

## 2015-01-24 MED ORDER — PHENYLEPHRINE HCL 10 MG/ML IJ SOLN
INTRAMUSCULAR | Status: AC
  Filled 2015-01-24: qty 1

## 2015-01-24 MED ORDER — HYDROCODONE-ACETAMINOPHEN 10-325 MG PO TABS: 1.0000 | ORAL_TABLET | Freq: Four times a day (QID) | ORAL | Status: DC | PRN

## 2015-01-24 MED ORDER — EPHEDRINE SULFATE 50 MG/ML IJ SOLN
INTRAMUSCULAR | Status: AC
  Filled 2015-01-24: qty 1

## 2015-01-24 MED ORDER — METHOCARBAMOL 1000 MG/10ML IJ SOLN
500.0000 mg | Freq: Four times a day (QID) | INTRAMUSCULAR | Status: DC | PRN
  Filled 2015-01-24: qty 5

## 2015-01-24 MED ORDER — ONDANSETRON HCL 4 MG PO TABS: 4.0000 mg | ORAL_TABLET | Freq: Three times a day (TID) | ORAL | Status: DC | PRN

## 2015-01-24 MED ORDER — GLIMEPIRIDE 4 MG PO TABS
2.0000 mg | ORAL_TABLET | Freq: Every day | ORAL | Status: DC
  Administered 2015-01-25: 2 mg via ORAL
  Filled 2015-01-24: qty 1

## 2015-01-24 MED ORDER — ARTIFICIAL TEARS OP OINT
TOPICAL_OINTMENT | OPHTHALMIC | Status: AC
  Filled 2015-01-24: qty 3.5

## 2015-01-24 MED ORDER — NEOSTIGMINE METHYLSULFATE 10 MG/10ML IV SOLN
INTRAVENOUS | Status: AC
  Filled 2015-01-24: qty 1

## 2015-01-24 MED ORDER — LIDOCAINE HCL (CARDIAC) 20 MG/ML IV SOLN
INTRAVENOUS | Status: DC | PRN
  Administered 2015-01-24: 100 mg via INTRAVENOUS

## 2015-01-24 MED ORDER — FAMOTIDINE 20 MG PO TABS
20.0000 mg | ORAL_TABLET | Freq: Two times a day (BID) | ORAL | Status: DC
  Administered 2015-01-24 – 2015-01-25 (×2): 20 mg via ORAL
  Filled 2015-01-24 (×2): qty 1

## 2015-01-24 MED ORDER — DOCUSATE SODIUM 100 MG PO CAPS
100.0000 mg | ORAL_CAPSULE | Freq: Two times a day (BID) | ORAL | Status: DC
  Administered 2015-01-24 – 2015-01-25 (×3): 100 mg via ORAL
  Filled 2015-01-24 (×2): qty 1

## 2015-01-24 MED ORDER — SODIUM CHLORIDE 0.9 % IR SOLN
Status: DC | PRN
  Administered 2015-01-24 (×2): 1000 mL

## 2015-01-24 MED ORDER — METHOCARBAMOL 500 MG PO TABS
500.0000 mg | ORAL_TABLET | Freq: Four times a day (QID) | ORAL | Status: DC | PRN
  Administered 2015-01-25: 500 mg via ORAL
  Filled 2015-01-24: qty 1

## 2015-01-24 MED ORDER — ASPIRIN 81 MG PO CHEW
81.0000 mg | CHEWABLE_TABLET | Freq: Every day | ORAL | Status: DC
  Administered 2015-01-24 – 2015-01-25 (×2): 81 mg via ORAL
  Filled 2015-01-24 (×2): qty 1

## 2015-01-24 MED ORDER — CEFAZOLIN SODIUM 1-5 GM-% IV SOLN
1.0000 g | Freq: Four times a day (QID) | INTRAVENOUS | Status: AC
  Administered 2015-01-24 – 2015-01-25 (×3): 1 g via INTRAVENOUS
  Filled 2015-01-24 (×3): qty 50

## 2015-01-24 MED ORDER — NEOSTIGMINE METHYLSULFATE 10 MG/10ML IV SOLN
INTRAVENOUS | Status: DC | PRN
Start: 2015-01-24 — End: 2015-01-24
  Administered 2015-01-24: 3 mg via INTRAVENOUS

## 2015-01-24 MED ORDER — INSULIN ASPART 100 UNIT/ML ~~LOC~~ SOLN
0.0000 [IU] | Freq: Three times a day (TID) | SUBCUTANEOUS | Status: DC
  Administered 2015-01-25: 5 [IU] via SUBCUTANEOUS
  Administered 2015-01-25: 3 [IU] via SUBCUTANEOUS

## 2015-01-24 MED ORDER — BACLOFEN 10 MG PO TABS: 10.0000 mg | ORAL_TABLET | Freq: Three times a day (TID) | ORAL | Status: DC

## 2015-01-24 MED ORDER — ROCURONIUM BROMIDE 50 MG/5ML IV SOLN
INTRAVENOUS | Status: AC
  Filled 2015-01-24: qty 1

## 2015-01-24 MED ORDER — MENTHOL 3 MG MT LOZG: 1.0000 | LOZENGE | OROMUCOSAL | Status: DC | PRN

## 2015-01-24 MED ORDER — POTASSIUM CHLORIDE IN NACL 20-0.45 MEQ/L-% IV SOLN
INTRAVENOUS | Status: DC
Start: 2015-01-24 — End: 2015-01-25
  Administered 2015-01-24: 15:00:00 via INTRAVENOUS
  Filled 2015-01-24 (×3): qty 1000

## 2015-01-24 MED ORDER — ALUM & MAG HYDROXIDE-SIMETH 200-200-20 MG/5ML PO SUSP: 30.0000 mL | ORAL | Status: DC | PRN

## 2015-01-24 MED ORDER — METOCLOPRAMIDE HCL 5 MG/ML IJ SOLN: 5.0000 mg | Freq: Three times a day (TID) | INTRAMUSCULAR | Status: DC | PRN

## 2015-01-24 MED ORDER — BISACODYL 10 MG RE SUPP: 10.0000 mg | Freq: Every day | RECTAL | Status: DC | PRN

## 2015-01-24 MED ORDER — ZOLPIDEM TARTRATE 5 MG PO TABS: 5.0000 mg | ORAL_TABLET | Freq: Every evening | ORAL | Status: DC | PRN

## 2015-01-24 MED ORDER — ACETAMINOPHEN 650 MG RE SUPP: 650.0000 mg | Freq: Four times a day (QID) | RECTAL | Status: DC | PRN

## 2015-01-24 MED ORDER — DIPHENHYDRAMINE HCL 12.5 MG/5ML PO ELIX: 12.5000 mg | ORAL_SOLUTION | ORAL | Status: DC | PRN

## 2015-01-24 MED ORDER — ROCURONIUM BROMIDE 100 MG/10ML IV SOLN
INTRAVENOUS | Status: DC | PRN
  Administered 2015-01-24: 35 mg via INTRAVENOUS

## 2015-01-24 MED ORDER — PHENYLEPHRINE 40 MCG/ML (10ML) SYRINGE FOR IV PUSH (FOR BLOOD PRESSURE SUPPORT)
PREFILLED_SYRINGE | INTRAVENOUS | Status: AC
  Filled 2015-01-24: qty 10

## 2015-01-24 MED ORDER — MORPHINE SULFATE 2 MG/ML IJ SOLN
1.0000 mg | INTRAMUSCULAR | Status: DC | PRN
  Administered 2015-01-24 (×2): 1 mg via INTRAVENOUS
  Administered 2015-01-25 (×2): 2 mg via INTRAVENOUS
  Filled 2015-01-24 (×4): qty 1

## 2015-01-24 MED ORDER — ATORVASTATIN CALCIUM 10 MG PO TABS: 15.0000 mg | ORAL_TABLET | ORAL | Status: DC

## 2015-01-24 MED ORDER — PHENAZOPYRIDINE HCL 200 MG PO TABS
200.0000 mg | ORAL_TABLET | Freq: Three times a day (TID) | ORAL | Status: DC
  Administered 2015-01-24 – 2015-01-25 (×3): 200 mg via ORAL
  Filled 2015-01-24 (×5): qty 1

## 2015-01-24 MED ORDER — GLYCOPYRROLATE 0.2 MG/ML IJ SOLN
INTRAMUSCULAR | Status: AC
  Filled 2015-01-24: qty 4

## 2015-01-24 SURGICAL SUPPLY — 66 items
BENZOIN TINCTURE PRP APPL 2/3 (GAUZE/BANDAGES/DRESSINGS) ×2 IMPLANT
BIT DRILL 5/64X5 DISP (BIT) ×2 IMPLANT
BIT DRILL QUICK REL 1/8 2PK SL (DRILL) ×1 IMPLANT
BLADE SAW SGTL MED 73X18.5 STR (BLADE) ×2 IMPLANT
BOWL SMART MIX CTS (DISPOSABLE) IMPLANT
BRUSH FEMORAL CANAL (MISCELLANEOUS) IMPLANT
CAPT SHLDR TOTAL 2 ×2 IMPLANT
CEMENT BONE DEPUY (Cement) ×2 IMPLANT
CLSR STERI-STRIP ANTIMIC 1/2X4 (GAUZE/BANDAGES/DRESSINGS) ×2 IMPLANT
COVER SURGICAL LIGHT HANDLE (MISCELLANEOUS) ×2 IMPLANT
COVER TABLE BACK 60X90 (DRAPES) IMPLANT
DRAPE C-ARM 42X72 X-RAY (DRAPES) IMPLANT
DRAPE IMP U-DRAPE 54X76 (DRAPES) ×2 IMPLANT
DRAPE U-SHAPE 47X51 STRL (DRAPES) ×2 IMPLANT
DRILL QUICK RELEASE 1/8 INCH (DRILL) ×1
DRSG MEPILEX BORDER 4X8 (GAUZE/BANDAGES/DRESSINGS) ×2 IMPLANT
DURAPREP 26ML APPLICATOR (WOUND CARE) ×2 IMPLANT
ELECT REM PT RETURN 9FT ADLT (ELECTROSURGICAL) ×2
ELECTRODE REM PT RTRN 9FT ADLT (ELECTROSURGICAL) ×1 IMPLANT
EVACUATOR 1/8 PVC DRAIN (DRAIN) IMPLANT
FACESHIELD WRAPAROUND (MASK) ×2 IMPLANT
GAUZE SPONGE 4X4 12PLY STRL (GAUZE/BANDAGES/DRESSINGS) ×2 IMPLANT
GLOVE BIOGEL PI IND STRL 8 (GLOVE) ×1 IMPLANT
GLOVE BIOGEL PI INDICATOR 8 (GLOVE) ×1
GLOVE BIOGEL PI ORTHO PRO SZ8 (GLOVE) ×1
GLOVE ORTHO TXT STRL SZ7.5 (GLOVE) ×4 IMPLANT
GLOVE PI ORTHO PRO STRL SZ8 (GLOVE) ×1 IMPLANT
GLOVE SURG ORTHO 8.0 STRL STRW (GLOVE) ×4 IMPLANT
GOWN STRL REUS W/ TWL LRG LVL3 (GOWN DISPOSABLE) ×2 IMPLANT
GOWN STRL REUS W/ TWL XL LVL3 (GOWN DISPOSABLE) IMPLANT
GOWN STRL REUS W/TWL 2XL LVL3 (GOWN DISPOSABLE) ×2 IMPLANT
GOWN STRL REUS W/TWL LRG LVL3 (GOWN DISPOSABLE) ×2
GOWN STRL REUS W/TWL XL LVL3 (GOWN DISPOSABLE)
HANDPIECE INTERPULSE COAX TIP (DISPOSABLE) ×1
HOOD PEEL AWAY FACE SHEILD DIS (HOOD) ×2 IMPLANT
KIT BASIN OR (CUSTOM PROCEDURE TRAY) ×2 IMPLANT
KIT ROOM TURNOVER OR (KITS) ×2 IMPLANT
MANIFOLD NEPTUNE II (INSTRUMENTS) ×2 IMPLANT
NEEDLE 1/2 CIR CATGUT .05X1.09 (NEEDLE) IMPLANT
NEEDLE HYPO 25GX1X1/2 BEV (NEEDLE) IMPLANT
NS IRRIG 1000ML POUR BTL (IV SOLUTION) ×2 IMPLANT
PACK SHOULDER (CUSTOM PROCEDURE TRAY) ×2 IMPLANT
PACK UNIVERSAL I (CUSTOM PROCEDURE TRAY) ×2 IMPLANT
PAD ARMBOARD 7.5X6 YLW CONV (MISCELLANEOUS) ×4 IMPLANT
PIN HUMERAL STMN 3.2MMX9IN (INSTRUMENTS) ×2 IMPLANT
SET HNDPC FAN SPRY TIP SCT (DISPOSABLE) ×1 IMPLANT
SLING ARM IMMOBILIZER LRG (SOFTGOODS) IMPLANT
SLING ARM IMMOBILIZER MED (SOFTGOODS) IMPLANT
SMARTMIX MINI TOWER (MISCELLANEOUS)
SPONGE LAP 18X18 X RAY DECT (DISPOSABLE) ×2 IMPLANT
SUCTION FRAZIER TIP 10 FR DISP (SUCTIONS) ×2 IMPLANT
SUPPORT WRAP ARM LG (MISCELLANEOUS) ×2 IMPLANT
SUT FIBERWIRE #2 38 REV NDL BL (SUTURE) ×12
SUT MAXBRAID (SUTURE) IMPLANT
SUT MNCRL AB 4-0 PS2 18 (SUTURE) IMPLANT
SUT VIC AB 0 CT1 27 (SUTURE) ×1
SUT VIC AB 0 CT1 27XBRD ANBCTR (SUTURE) ×1 IMPLANT
SUT VIC AB 2-0 CT1 27 (SUTURE)
SUT VIC AB 2-0 CT1 TAPERPNT 27 (SUTURE) IMPLANT
SUT VIC AB 3-0 SH 8-18 (SUTURE) ×2 IMPLANT
SUTURE FIBERWR#2 38 REV NDL BL (SUTURE) ×6 IMPLANT
SYR CONTROL 10ML LL (SYRINGE) IMPLANT
TOWEL OR 17X24 6PK STRL BLUE (TOWEL DISPOSABLE) ×2 IMPLANT
TOWEL OR 17X26 10 PK STRL BLUE (TOWEL DISPOSABLE) ×2 IMPLANT
TOWER SMARTMIX MINI (MISCELLANEOUS) IMPLANT
WATER STERILE IRR 1000ML POUR (IV SOLUTION) ×2 IMPLANT

## 2015-01-24 NOTE — Anesthesia Procedure Notes (Addendum)
Anesthesia Regional Block:  Interscalene brachial plexus block  Pre-Anesthetic Checklist: ,, timeout performed, Correct Patient, Correct Site, Correct Laterality, Correct Procedure, Correct Position, site marked, Risks and benefits discussed,  Surgical consent,  Pre-op evaluation,  At surgeon's request and post-op pain management  Laterality: Right and Upper  Prep: Maximum Sterile Barrier Precautions used and chloraprep       Needles:  Injection technique: Single-shot  Needle Type: Echogenic Stimulator Needle     Needle Length: 10cm 10 cm Needle Gauge: 21 and 21 G    Additional Needles:  Procedures: ultrasound guided (picture in chart) and nerve stimulator Interscalene brachial plexus block  Nerve Stimulator or Paresthesia:  Response: 0.4 mA,   Additional Responses:   Narrative:  Injection made incrementally with aspirations every 5 mL.  Performed by: Personally  Anesthesiologist: Alexis Frock  Additional Notes: R IS block, 68ml .5% marcaine with epi, [place] and stim, multiple asp, talked to patient throughout, no complications   Procedure Name: Intubation Date/Time: 01/24/2015 7:39 AM Performed by: Scheryl Darter Pre-anesthesia Checklist: Patient identified, Emergency Drugs available, Suction available and Patient being monitored Patient Re-evaluated:Patient Re-evaluated prior to inductionOxygen Delivery Method: Circle system utilized Preoxygenation: Pre-oxygenation with 100% oxygen Intubation Type: IV induction Ventilation: Mask ventilation without difficulty Laryngoscope Size: Mac and 3 Grade View: Grade III Tube type: Oral Tube size: 8.0 mm Number of attempts: 2 Airway Equipment and Method: Stylet Placement Confirmation: ETT inserted through vocal cords under direct vision,  positive ETCO2 and breath sounds checked- equal and bilateral Secured at: 23 cm Tube secured with: Tape Dental Injury: Teeth and Oropharynx as per pre-operative assessment  Difficulty Due  To: Difficulty was anticipated, Difficult Airway- due to reduced neck mobility, Difficult Airway- due to anterior larynx and Difficult Airway- due to limited oral opening

## 2015-01-24 NOTE — Transfer of Care (Signed)
Immediate Anesthesia Transfer of Care Note  Patient: George Barnett  Procedure(s) Performed: Procedure(s): RIGHT TOTAL SHOULDER ARTHROPLASTY (Right)  Patient Location: PACU  Anesthesia Type:General and Regional  Level of Consciousness: awake, alert , oriented and sedated  Airway & Oxygen Therapy: Patient Spontanous Breathing and Patient connected to nasal cannula oxygen  Post-op Assessment: Report given to RN, Post -op Vital signs reviewed and stable and Patient moving all extremities  Post vital signs: Reviewed and stable  Last Vitals:  Filed Vitals:   01/24/15 0607  BP: 161/65  Pulse: 55  Temp: 36.5 C  Resp: 20    Complications: No apparent anesthesia complications

## 2015-01-24 NOTE — Op Note (Signed)
01/24/2015  10:09 AM  PATIENT:  George Barnett    PRE-OPERATIVE DIAGNOSIS:  DJD RIGHT SHOULDER  POST-OPERATIVE DIAGNOSIS:  Same  PROCEDURE:  RIGHT TOTAL SHOULDER ARTHROPLASTY  SURGEON:  Johnny Bridge, MD  PHYSICIAN ASSISTANT: Joya Gaskins, OPA-C, present and scrubbed throughout the case, critical for completion in a timely fashion, and for retraction, instrumentation, and closure.  ANESTHESIA:   General  PREOPERATIVE INDICATIONS:  George Barnett is a  81 y.o. male with a diagnosis of DJD RIGHT SHOULDER who failed conservative measures and elected for surgical management.    The risks benefits and alternatives were discussed with the patient preoperatively including but not limited to the risks of infection, bleeding, nerve injury, cardiopulmonary complications, the need for revision surgery, dislocation, loosening, incomplete relief of pain, among others, and the patient was willing to proceed.   OPERATIVE IMPLANTS: Biomet size 10 mini press-fit humeral stem, size 46+21 Versa-dial humeral head, set in the E position with increased coverage posteriorly, with a medium cemented glenoid polyethylene 3 peg implant with a central regenerex noncemented post.   OPERATIVE FINDINGS: Advanced glenohumeral osteoarthritis involving the glenoid and the humeral head with substantial osteophyte formation inferiorly. He had a biconcave glenoid, B2 configuration, with substantial osteophyte inferior humeral head.  Unique aspects of the procedure: There was a very large anterior osteophyte on the humeral neck, such that the subscapularis tenotomy was taken more off of bone, and therefore repaired through drill holes in the proximal humerus. The shoulder was extremely stiff before the operation, and after the operation I was able to get him to above 90, but even though I could put a finger between the glenoid and the humeral head, such that the soft tissue tension was overall relaxed, in the overhead  motion he still had a fair amount of stiffness. I did place the humeral stem in 20 of retroversion, rather than my normal 30, because he was articulating on the posterior third of the glenoid chronically, and I wanted to reorient the force to be more neutral, and prevent the posterior forces being applied to the polyethylene glenoid component.   OPERATIVE PROCEDURE: The patient was brought to the operating room and placed in the supine position. General anesthesia was administered. IV antibiotics were given.  The upper extremity was prepped and draped in usual sterile fashion. The patient was in a beachchair position with all bony prominences padded.   Time out was performed and a deltopectoral approach was carried out. The biceps tendon was tenodesed to the pectoralis tendon. The subscapularis was released, tagging it with a #2 Fiberwiere, but after the release, there was not a lot of tendon laterally, and so I planned for drill hole repair. There was a very large osteophyte anteriorly which I think was taking up some of the tension on the subscapularis.   The inferior osteophyte was removed, and release of the capsule off of the humeral side was completed. The head was dislocated, and I reamed sequentially. I placed the humeral cutting guide at 20 of retroversion, and then pinned this into place, and made my humeral neck cut. This was at the appropriate level.   I then placed deep retractors and exposed the glenoid. I excised the labrum circumferentially, taking care to protect the axillary nerve inferiorly. There was a very tight anterior band of the inferior glenohumeral ligament, which I released off of the glenoid.  I then placed a guidewire into the center position, controlling appropriate version and inclination. I then reamed over the  guidewire with the small reamer, and was satisfied with the preparation. I preserved the subchondral bone in order to maximize the strength and minimize the risk  for subsequent subsidence. I did take enough bone in order to flatten the biconcave surface.  I then drilled the central hole for the regenerex peg, and then placed the guide, and then drilled the 3 peripheral peg holes. I had excellent bony circumferential contact. All 3 pegs had a good endpoint, and the central hole was within the glenoid vault.  I then cleaned the glenoid, irrigated it copiously, and then dried it and cemented the prosthesis into place. Excellent seating was achieved. I had full exposure. The cement cured, and then I turned my attention to the humeral side.   I sequentially broached, up to the selected size, with the broach set at 20 of retroversion. I then placed the real stem. I trialed with multiple heads, and the above-named component was selected. Increased posterior coverage improved the coverage. The soft tissue tension was appropriate. I placed a total of 3 #2 FiberWire through the proximal humerus through drill holes before placing the real prosthesis.  I then impacted the real humeral head into place, reduced the head, and irrigated copiously. Excellent stability and range of motion was achieved. I repaired the subscapularis with a total of 5 #2 FiberWire, one through the rotator interval, and 1 through inferior tissue, and 3 central sutures through drill holes in the bone as indicated above.  I irrigated copiously once more. The subcutaneous tissue was closed with Vicryl including the deltopectoral fascia.   The skin was closed with Steri-Strips and sterile gauze was applied. He had a preoperative nerve block. He tolerated the procedure well and there were no complications.

## 2015-01-24 NOTE — Progress Notes (Signed)
Utilization review completed.  

## 2015-01-24 NOTE — Evaluation (Addendum)
Occupational Therapy Evaluation Patient Details Name: George Barnett MRN: [id number] DOB: Jan 06, 1933 Today's Date: 01/24/2015    History of Present Illness 81 y.o. s/p right TSA.   Clinical Impression   Pt s/p above. Pt independent with ADLs, PTA. Feel pt will benefit from acute OT to increase independence and address RUE prior to d/c. Plan to review ADL information and perform UE exercises tomorrow.    Follow Up Recommendations  No OT follow up;Supervision - Intermittent    Equipment Recommendations  None recommended by OT    Recommendations for Other Services       Precautions / Restrictions Precautions Precautions: Fall;Shoulder Type of Shoulder Precautions: no shoulder movement; elbow, wrist, hand AROM, no pushing/pulling/lifting with right UE (can use to hold light items) Shoulder Interventions: Shoulder sling/immobilizer;Off for dressing/bathing/exercises Precaution Booklet Issued: Yes (comment) Precaution Comments: educated on shoulder precautions Required Braces or Orthoses: Sling Restrictions Weight Bearing Restrictions: Yes RUE Weight Bearing: Non weight bearing      Mobility Bed Mobility Overal bed mobility: Needs Assistance Bed Mobility: Supine to Sit;Sit to Supine     Supine to sit: Min assist Sit to supine: Supervision   General bed mobility comments: assist to scoot HOB and [place] position used. Cues for technique. Suggested getting in/out of bed on left side to help maintain shoulder precautions. Assist with trunk when going from supine to sitting position.  Transfers Overall transfer level: Needs assistance   Transfers: Sit to/from Stand Sit to Stand: Min guard;Min assist         General transfer comment: cues for technique.    Balance  Min guard for ambulation. Min A to steady for first stand from bed.                                          ADL Overall ADL's : Needs assistance/impaired                         Toilet Transfer: Min guard;Minimal assistance;Ambulation (sit <> stand to/from bed; Min guard for ambulation)           Functional mobility during ADLs: Min guard General ADL Comments: Educated on information on shoulder handout. Explained benefit of ice and applied at end of session to right shoulder.     Vision     Perception     Praxis      Pertinent Vitals/Pain Pain Assessment: 0-10 Pain Score:  (4-5) Pain Location: Rt shoulder Pain Descriptors / Indicators: Burning (stinging) Pain Intervention(s): Monitored during session;Repositioned;Ice applied     Hand Dominance Right   Extremity/Trunk Assessment Upper Extremity Assessment Upper Extremity Assessment: RUE deficits/detail RUE Deficits / Details: Rt TSA; still numb from surgery RUE Sensation: decreased light touch   Lower Extremity Assessment Lower Extremity Assessment: Defer to PT evaluation       Communication Communication Communication: No difficulties   Cognition Arousal/Alertness: Awake/alert Behavior During Therapy: WFL for tasks assessed/performed Overall Cognitive Status: Within Functional Limits for tasks assessed                     General Comments       Exercises Exercises: Other exercises;Shoulder Other Exercises Other Exercises: educated/demonstrated retrograde massage on right digits.  Other Exercises: performed PROM of right digits and also right wrist flexion/extension-pt still numb. also talked about neck ROM. Explained importance of elbow,  wrist, hand ROM.   Shoulder Instructions Shoulder Instructions Donning/doffing shirt without moving shoulder:  (educated and wife able to verbalize technique) Method for sponge bathing under operated UE:  (educated and reviewed) Donning/doffing sling/immobilizer:  (educated and wife assisted) Correct positioning of sling/immobilizer: Supervision/safety (educated) ROM for elbow, wrist and digits of operated UE:  (pt unable to  perform AROM due to numbness) Proper positioning of operated UE when showering: Patient able to independently direct caregiver (pt able to verbalize) Positioning of UE while sleeping: Patient able to independently direct caregiver (pt able to verbalize)    Home Living Family/patient expects to be discharged to:: Private residence Living Arrangements: Spouse/significant other Available Help at Discharge: Family;Available 24 hours/day Type of Home: House Home Access: Stairs to enter [employer] of Steps: 1-2 Entrance Stairs-Rails: None Home Layout: One level     Bathroom Shower/Tub: Tub/shower unit;Walk-in shower         Home Equipment: Shower seat;Bedside commode;Cane - single point;Walker - 4 wheels          Prior Functioning/Environment Level of Independence: Independent             OT Diagnosis: Acute pain   OT Problem List: Decreased strength;Impaired sensation;Impaired UE functional use;Pain;Decreased knowledge of precautions;Decreased knowledge of use of DME or AE;Impaired balance (sitting and/or standing);Decreased range of motion;Increased edema   OT Treatment/Interventions: Self-care/ADL training;Therapeutic exercise;DME and/or AE instruction;Therapeutic activities;Patient/family education;Balance training    OT Goals(Current goals can be found in the care plan section) Acute Rehab OT Goals Patient Stated Goal: not stated OT Goal Formulation: With patient/family Time For Goal Achievement: 01/31/15 Potential to Achieve Goals: Good ADL Goals Additional ADL Goal #1: Pt will be independent with HEP (elbow, wrist, hand, and neck ROM). Additional ADL Goal #2: Pt/caregiver will be independent with ADLs while maintaining shoulder precautions.  OT Frequency: Min 2X/week   Barriers to D/C:            Co-evaluation              End of Session Equipment Utilized During Treatment: Gait belt;Other (comment) (sling) Nurse Communication: Mobility  status  Activity Tolerance: Patient tolerated treatment well Patient left: in bed;with call bell/phone within reach;with family/visitor present;with SCD's reapplied   Time: [id number] OT Time Calculation (min): 30 min Charges:  OT General Charges $OT Visit: 1 Procedure OT Evaluation $Initial OT Evaluation Tier I: 1 Procedure OT Treatments $Self Care/Home Management : 8-22 mins G-CodesBenito Barnett OTR/L [id number] 01/24/2015, 4:51 PM

## 2015-01-24 NOTE — Progress Notes (Signed)
Patient still unable to void. Was in and out cath once at 330pm. Complained of burning when attempting to void which per patient stops him from voiding. Request for pyridium for burning sensation. MD on call was notified and orders was given for medication and if patient was unable to void still insert indwelling catheter. Medication was given and patient still was unable to void. Bladder scan showed >563ml in bladder. Indwelling foley inserted at 2350. Clear yellow urine observed in catheter tubing. Will continue to monitor patient.

## 2015-01-24 NOTE — Anesthesia Postprocedure Evaluation (Signed)
  Anesthesia Post-op Note  Patient: George Barnett  Procedure(s) Performed: Procedure(s): RIGHT TOTAL SHOULDER ARTHROPLASTY (Right)  Patient Location: PACU  Anesthesia Type:General  Level of Consciousness: awake  Airway and Oxygen Therapy: Patient Spontanous Breathing and Patient connected to nasal cannula oxygen  Post-op Pain: mild  Post-op Assessment: Respiratory Function Stable and Patent Airway  Post-op Vital Signs: Reviewed and stable  Last Vitals:  Filed Vitals:   01/24/15 1200  BP: 111/60  Pulse: 59  Temp:   Resp: 19    Complications: No apparent anesthesia complications

## 2015-01-24 NOTE — Discharge Instructions (Signed)
Diet: As you were doing prior to hospitalization   Shower:  May shower but keep the wounds dry, use an occlusive plastic wrap, NO SOAKING IN TUB.  If the bandage gets wet, change with a clean dry gauze.  Dressing:  You may change your dressing 3-5 days after surgery.  Then change the dressing daily with sterile gauze dressing.    There are sticky tapes (steri-strips) on your wounds and all the stitches are absorbable.  Leave the steri-strips in place when changing your dressings, they will peel off with time, usually 2-3 weeks.  Activity:  Increase activity slowly as tolerated, but follow the weight bearing instructions below.  No lifting or driving for 6 weeks.  Weight Bearing:   Sling at all times..    To prevent constipation: you may use a stool softener such as -  Colace (over the counter) 100 mg by mouth twice a day  Drink plenty of fluids (prune juice may be helpful) and high fiber foods Miralax (over the counter) for constipation as needed.    Itching:  If you experience itching with your medications, try taking only a single pain pill, or even half a pain pill at a time.  You may take up to 10 pain pills per day, and you can also use benadryl over the counter for itching or also to help with sleep.   Precautions:  If you experience chest pain or shortness of breath - call 911 immediately for transfer to the hospital emergency department!!  If you develop a fever greater that 101 F, purulent drainage from wound, increased redness or drainage from wound, or calf pain -- Call the office at [phone]                                                Follow- Up Appointment:  Please call for an appointment to be seen in 2 weeks [place] - [phone]

## 2015-01-24 NOTE — Progress Notes (Signed)
Orthopedic Tech Progress Note Patient Details:  George Barnett 1933-09-30 [id number] Pt. unable to use OHF with trapeze due to shoulder injury. Patient ID: George Barnett, male   DOB: 07-16-1933, 81 y.o.   MRN: [id number]   [name] 01/24/2015, 1:55 PM

## 2015-01-24 NOTE — H&P (Signed)
PREOPERATIVE H&P  Chief Complaint: DJD RIGHT SHOULDER  HPI: George Barnett is a 81 y.o. male who presents for preoperative history and physical with a diagnosis of DJD RIGHT SHOULDER. Symptoms are rated as moderate to severe, and have been worsening.  This is significantly impairing activities of daily living.  He has elected for surgical management.   He has failed injections, activity modification, anti-inflammatories, and assistive devices. He could not take anti-inflammatories because of cardiac history.  Preoperative X-rays demonstrate end stage degenerative changes with osteophyte formation, loss of joint space, subchondral sclerosis.   Past Medical History  Diagnosis Date  . Diabetes mellitus without complication   . Heart disease   . Cataract   . Heart attack   . Hypertension   . Frequency of urination   . Prostate enlargement   . GERD (gastroesophageal reflux disease)   . Cancer     SKIN CANCER   . Tremors of nervous system    Past Surgical History  Procedure Laterality Date  . Prostate biopsy    . Diplomatic Services operational officer    . Cardiac catheterization      1999  . Mohs surgery      BILAT EARS   . Hernia repair      BILAT    History   Social History  . Marital Status: Married    Spouse Name: N/A  . Number of Children: N/A  . Years of Education: N/A   Social History Main Topics  . Smoking status: Never Smoker   . Smokeless tobacco: Not on file  . Alcohol Use: No  . Drug Use: No  . Sexual Activity: Not on file   Other Topics Concern  . None   Social History Narrative   Family History  Problem Relation Age of Onset  . Hypertension Brother    Allergies  Allergen Reactions  . Exenatide     nausea  . Meperidine And Related   . Nadolol     Numbness in fingers  . Sitagliptin Phosphate [Sitagliptin]     Weight loss    Prior to Admission medications   Medication Sig Start Date End Date Taking? Authorizing Provider  aspirin 81 MG tablet Take 81 mg by mouth  daily.   Yes Historical Provider, MD  atorvastatin (LIPITOR) 10 MG tablet Take 10-15 mg by mouth daily. Mon Wed Fri 15 mg Sun Tu Th Sa 10 mg   Yes Historical Provider, MD  glimepiride (AMARYL) 2 MG tablet Take 2 mg by mouth daily with breakfast.   Yes Historical Provider, MD  hydrochlorothiazide (HYDRODIURIL) 25 MG tablet Take 25 mg by mouth daily.   Yes Historical Provider, MD  losartan (COZAAR) 50 MG tablet Take 25 mg by mouth daily.    Yes Historical Provider, MD  metFORMIN (GLUCOPHAGE) 500 MG tablet Take 500-1,000 mg by mouth 2 (two) times daily with a meal. 1 tablet in the morning, 2 tablets in the evening   Yes Historical Provider, MD  metoprolol (LOPRESSOR) 50 MG tablet Take 25 mg by mouth 2 (two) times daily.    Yes Historical Provider, MD  Multiple Vitamin (MULTIVITAMIN) tablet Take 1 tablet by mouth daily.   Yes Historical Provider, MD  ondansetron (ZOFRAN ODT) 4 MG disintegrating tablet Take 1 tablet (4 mg total) by mouth every 8 (eight) hours as needed for nausea or vomiting. Patient not taking: Reported on 01/10/2015 03/07/14   Robyn Haber, MD  OVER THE COUNTER MEDICATION CoQ 10 100 mg capsule 1 po daily  Yes Historical Provider, MD  OVER THE COUNTER MEDICATION Folic Acid Q000111Q mg tab 1 po daily   Yes Historical Provider, MD  OVER THE COUNTER MEDICATION Vitamin B 12 1000 mcg tab 1 po daily   Yes Historical Provider, MD  Polyethyl Glycol-Propyl Glycol (SYSTANE OP) Apply 1 drop to eye 3 (three) times daily as needed (Dry eyes).   Yes Historical Provider, MD  Probiotic Product (ALIGN) 4 MG CAPS Take 1 capsule by mouth daily.   Yes Historical Provider, MD  ranitidine (ZANTAC) 150 MG capsule Take 150 mg by mouth daily.   Yes Historical Provider, MD  triamcinolone cream (KENALOG) 0.1 % Apply 1 application topically 2 (two) times daily. Patient not taking: Reported on 01/10/2015 07/04/14   Darreld Mclean, MD     Positive ROS: All other systems have been reviewed and were otherwise  negative with the exception of those mentioned in the HPI and as above.  Physical Exam: General: Alert, no acute distress Cardiovascular: No pedal edema Respiratory: No cyanosis, no use of accessory musculature GI: No organomegaly, abdomen is soft and non-tender Skin: No lesions in the area of chief complaint Neurologic: Sensation intact distally Psychiatric: Patient is competent for consent with normal mood and affect Lymphatic: No axillary or cervical lymphadenopathy  MUSCULOSKELETAL: Right shoulder active motion is 0-90 with external rotation to neutral with positive crepitance and intact rotator cuff strength.  Assessment: DJD RIGHT SHOULDER  Plan: Plan for Procedure(s): RIGHT TOTAL SHOULDER ARTHROPLASTY  The risks benefits and alternatives were discussed with the patient including but not limited to the risks of nonoperative treatment, versus surgical intervention including infection, bleeding, nerve injury,  blood clots, cardiopulmonary complications, morbidity, mortality, among others, and they were willing to proceed.   Johnny Bridge, MD Cell [phone]   01/24/2015 7:16 AM

## 2015-01-24 NOTE — Plan of Care (Signed)
Problem: Consults Goal: Diagnosis - Shoulder Surgery Total Shoulder Arthroplasty: Right

## 2015-01-25 ENCOUNTER — Encounter (HOSPITAL_COMMUNITY): Payer: Self-pay | Admitting: Orthopedic Surgery

## 2015-01-25 DIAGNOSIS — R338 Other retention of urine: Secondary | ICD-10-CM

## 2015-01-25 DIAGNOSIS — N401 Enlarged prostate with lower urinary tract symptoms: Secondary | ICD-10-CM

## 2015-01-25 HISTORY — DX: Benign prostatic hyperplasia with lower urinary tract symptoms: N40.1

## 2015-01-25 LAB — URINALYSIS, ROUTINE W REFLEX MICROSCOPIC
Bilirubin Urine: NEGATIVE
Glucose, UA: 500 mg/dL — AB
Hgb urine dipstick: NEGATIVE
Ketones, ur: 15 mg/dL — AB
Leukocytes, UA: NEGATIVE
Nitrite: NEGATIVE
PH: 5.5 (ref 5.0–8.0)
Protein, ur: NEGATIVE mg/dL
Specific Gravity, Urine: 1.017 (ref 1.005–1.030)
Urobilinogen, UA: 0.2 mg/dL (ref 0.0–1.0)

## 2015-01-25 LAB — CBC
HCT: 29.7 % — ABNORMAL LOW (ref 39.0–52.0)
Hemoglobin: 10.1 g/dL — ABNORMAL LOW (ref 13.0–17.0)
MCH: 33.8 pg (ref 26.0–34.0)
MCHC: 34 g/dL (ref 30.0–36.0)
MCV: 99.3 fL (ref 78.0–100.0)
Platelets: 147 10*3/uL — ABNORMAL LOW (ref 150–400)
RBC: 2.99 MIL/uL — AB (ref 4.22–5.81)
RDW: 12.1 % (ref 11.5–15.5)
WBC: 7.5 10*3/uL (ref 4.0–10.5)

## 2015-01-25 LAB — BASIC METABOLIC PANEL
ANION GAP: 6 (ref 5–15)
BUN: 27 mg/dL — AB (ref 6–23)
CALCIUM: 8.2 mg/dL — AB (ref 8.4–10.5)
CHLORIDE: 100 mmol/L (ref 96–112)
CO2: 28 mmol/L (ref 19–32)
Creatinine, Ser: 1.36 mg/dL — ABNORMAL HIGH (ref 0.50–1.35)
GFR calc Af Amer: 55 mL/min — ABNORMAL LOW (ref >90)
GFR calc non Af Amer: 48 mL/min — ABNORMAL LOW (ref >90)
GLUCOSE: 182 mg/dL — AB (ref 70–99)
Potassium: 4.4 mmol/L (ref 3.5–5.1)
Sodium: 134 mmol/L — ABNORMAL LOW (ref 135–145)

## 2015-01-25 LAB — GLUCOSE, CAPILLARY
Glucose-Capillary: 164 mg/dL — ABNORMAL HIGH (ref 70–99)
Glucose-Capillary: 239 mg/dL — ABNORMAL HIGH (ref 70–99)

## 2015-01-25 NOTE — Progress Notes (Signed)
Patient provided with discharge instructions and follow up information. He is going home with no further follow up needed. Foley in place and follow up scheduled with urologist. Leg strap provided. Patient going home at this time with wife and daughter.

## 2015-01-25 NOTE — Progress Notes (Signed)
Patient ID: George Barnett, male   DOB: Mar 16, 1933, 81 y.o.   MRN: [id number]     Subjective:  Patient reports pain as mild to moderate.  Patient had trouble with pain and voiding last night.    Objective:   VITALS:   Filed Vitals:   01/24/15 1245 01/24/15 1508 01/24/15 2104 01/25/15 0452  BP: 128/76 125/55 126/58 124/59  Pulse: 72 71 68 65  Temp: 98.2 F (36.8 C) 97.9 F (36.6 C) 98 F (36.7 C) 98.4 F (36.9 C)  TempSrc:      Resp: 20 16 16 16   Height:      Weight:      SpO2: 98% 100% 99% 95%    ABD soft Sensation intact distally Dorsiflexion/Plantar flexion intact Incision: dressing C/D/I and no drainage Good hand and wrist motion sensation intact to the fingers  Lab Results  Component Value Date   WBC 7.5 01/25/2015   HGB 10.1* 01/25/2015   HCT 29.7* 01/25/2015   MCV 99.3 01/25/2015   PLT 147* 01/25/2015   BMET    Component Value Date/Time   NA 137 01/12/2015 1000   K 4.6 01/12/2015 1000   CL 105 01/12/2015 1000   CO2 29 01/12/2015 1000   GLUCOSE 146* 01/12/2015 1000   BUN 33* 01/12/2015 1000   CREATININE 1.46* 01/12/2015 1000   CREATININE 1.23 03/07/2014 1454   CALCIUM 9.0 01/12/2015 1000   GFRNONAA 44* 01/12/2015 1000   GFRAA 51* 01/12/2015 1000     Assessment/Plan: 1 Day Post-Op   Principal Problem:   Primary osteoarthritis of right shoulder   Advance diet Up with therapy Plan for discharge tomorrow Sling at all times NWB right upper ext Dry dressing PRN   DOUGLAS PARRY, BRANDON 01/25/2015, 7:26 AM  Seen and agree.  DC home.  F/u with Dr. Mar Daring in 1 week for foley removal.  Dc home with foley.  Marchia Bond, MD Cell [phone]

## 2015-01-25 NOTE — Progress Notes (Signed)
Occupational Therapy Treatment Patient Details Name: George Barnett MRN: [id number] DOB: 1933-04-26 Today's Date: 01/25/2015    History of present illness 81 y.o. s/p right TSA.   OT comments  Reviewed shoulder information with pt and spouse and spouse practiced in assisting pt with ADLs. Feel pt is safe to d/c home, with wife available to assist.   Follow Up Recommendations  No OT follow up;Supervision - Intermittent    Equipment Recommendations  None recommended by OT    Recommendations for Other Services      Precautions / Restrictions Precautions Precautions: Fall;Shoulder Type of Shoulder Precautions: no shoulder movement; elbow, wrist, hand AROM, no pushing/pulling/lifting with right UE (can use to hold light items) Shoulder Interventions: Shoulder sling/immobilizer;Off for dressing/bathing/exercises Precaution Booklet Issued:  (gave to pt in session yesterday) Precaution Comments: educated/reviewed shoulder precautions Required Braces or Orthoses: Sling Restrictions Weight Bearing Restrictions: Yes RUE Weight Bearing: Non weight bearing       Mobility Bed Mobility Overal bed mobility: Needs Assistance Bed Mobility: Supine to Sit;Sit to Supine     Supine to sit: Min assist Sit to supine: Supervision   General bed mobility comments: educated wife how to assist pt out of bed.  Transfers Overall transfer level: Needs assistance   Transfers: Sit to/from Stand Sit to Stand: Min guard         General transfer comment: cues for technique. Min guard for safety.    Balance  Min guard for ambulation.                                 ADL Overall ADL's : Needs assistance/impaired                 Upper Body Dressing :  (see shoulder section below)   Lower Body Dressing: Sit to/from stand (wife donned pt's pants/underwear and OT assisted with threading catheter)   Toilet Transfer: Min guard (chair)           Functional mobility during  ADLs: Min guard General ADL Comments: Recommended spouse be with him for shower transfer and educated on safety in session such as sitting for LB bathing. Discussed use of ice with spouse.         Vision                     Perception     Praxis      Cognition  Awake/Alert Behavior During Therapy: WFL for tasks assessed/performed Overall Cognitive Status: Within Functional Limits for tasks assessed                       Extremity/Trunk Assessment               Exercises Other Exercises Other Exercises: Pt performed approximately 10 reps each of right AROM elbow flexion/extension, wrist flexion/extension, and digit composite flexion/extension and explained important to do these. Also, reminded him of neck ROM. Donning/doffing shirt without moving shoulder: Supervision/safety Method for sponge bathing under operated UE:  (pt able to verbalize) Donning/doffing sling/immobilizer: Minimal assistance (spouse practiced) Correct positioning of sling/immobilizer: Minimal assistance (spouse practiced) ROM for elbow, wrist and digits of operated UE: Min-guard Sling wearing schedule (on at all times/off for ADL's): Caregiver independent with task (spouse able to verbalize) Proper positioning of operated UE when showering: Caregiver independent with task (spouse able to verbalize) Positioning of UE while sleeping: Patient able to independently  direct caregiver (pt able to verbalize)   Shoulder Instructions Shoulder Instructions Donning/doffing shirt without moving shoulder: Supervision/safety Method for sponge bathing under operated UE:  (pt able to verbalize) Donning/doffing sling/immobilizer: Minimal assistance (spouse practiced) Correct positioning of sling/immobilizer: Minimal assistance (spouse practiced) ROM for elbow, wrist and digits of operated UE: Min-guard Sling wearing schedule (on at all times/off for ADL's): Caregiver independent with task (spouse able to  verbalize) Proper positioning of operated UE when showering: Caregiver independent with task (spouse able to verbalize) Positioning of UE while sleeping: Patient able to independently direct caregiver (pt able to verbalize)     General Comments      Pertinent Vitals/ Pain       Pain Assessment: 0-10 Pain Score: 6  Pain Location: RUE Pain Intervention(s): Monitored during session  Home Living                                          Prior Functioning/Environment              Frequency Min 2X/week     Progress Toward Goals  OT Goals(current goals can now be found in the care plan section)  Progress towards OT goals: Progressing toward goals  Acute Rehab OT Goals Patient Stated Goal: not stated OT Goal Formulation: With patient/family Time For Goal Achievement: 01/31/15 Potential to Achieve Goals: Good ADL Goals Additional ADL Goal #1: Pt will be independent with HEP (elbow, wrist, hand, and neck ROM). Additional ADL Goal #2: Pt/caregiver will be independent with ADLs while maintaining shoulder precautions.  Plan Discharge plan remains appropriate    Co-evaluation                 End of Session Equipment Utilized During Treatment: Other (comment) (sling)   Activity Tolerance Patient tolerated treatment well   Patient Left in chair;with call bell/phone within reach;with family/visitor present   Nurse Communication Mobility status        Time: [id number] OT Time Calculation (min): 31 min  Charges: OT General Charges $OT Visit: 1 Procedure OT Treatments $Self Care/Home Management : 23-37 mins  Benito Mccreedy OTR/L [id number] 01/25/2015, 10:24 AM

## 2015-01-25 NOTE — Progress Notes (Signed)
PT Cancellation Note  Patient Details Name: Muzammil Hettich MRN: [id number] DOB: 12/22/1932   Cancelled Treatment:    Reason Eval/Treat Not Completed: PT screened, no needs identified, will sign off. Spoke with OT, no acute PT needs warranted at this time.    Elie Confer Cando, [place] 01/25/2015, 11:08 AM

## 2015-01-25 NOTE — Progress Notes (Signed)
CARE MANAGEMENT NOTE 01/25/2015  Patient:  George Barnett,George Barnett   Account Number:  [id number]  Date Initiated:  01/25/2015  Documentation initiated by:  [facility]  Subjective/Objective Assessment:   s/p rt TSA     Action/Plan:   PT eval-no discharge needs identified   Anticipated DC Date:  01/25/2015   Anticipated DC Plan:  [place]  CM consult               Status of service:  Completed, signed off  Discharge Disposition:  HOME/SELF CARE  Per UR Regulation:  Reviewed for med. necessity/level of care/duration of stay

## 2015-01-25 NOTE — Discharge Summary (Signed)
Physician Discharge Summary  Patient ID: George Barnett MRN: [id number] DOB/AGE: [birth year] 79 y.o.  Admit date: 01/24/2015 Discharge date: 01/25/2015  Admission Diagnoses:  Primary osteoarthritis of right shoulder  Discharge Diagnoses:  Principal Problem:   Primary osteoarthritis of right shoulder postop urinary retention, required foley placement with 933ml out, left in place to f/u with Dr,. Dalstead in 1 week for foley removal.  Past Medical History  Diagnosis Date  . Diabetes mellitus without complication   . Heart disease   . Cataract   . Heart attack   . Hypertension   . Frequency of urination   . Prostate enlargement   . GERD (gastroesophageal reflux disease)   . Cancer     SKIN CANCER   . Tremors of nervous system   . Primary osteoarthritis of right shoulder 01/24/2015    Surgeries: Procedure(s): RIGHT TOTAL SHOULDER ARTHROPLASTY on 01/24/2015   Consultants (if any):    Discharged Condition: Improved  Hospital Course: George Barnett is an 81 y.o. male who was admitted 01/24/2015 with a diagnosis of Primary osteoarthritis of right shoulder and went to the operating room on 01/24/2015 and underwent the above named procedures.    He was given perioperative antibiotics:  Anti-infectives    Start     Dose/Rate Route Frequency Ordered Stop   01/24/15 1400  ceFAZolin (ANCEF) IVPB 1 g/50 mL premix     1 g 100 mL/hr over 30 Minutes Intravenous Every 6 hours 01/24/15 1322 01/25/15 0151   01/24/15 0600  ceFAZolin (ANCEF) IVPB 2 g/50 mL premix     2 g 100 mL/hr over 30 Minutes Intravenous On call to O.R. 01/23/15 1324 01/24/15 0752    .  He was given sequential compression devices, early ambulation for DVT prophylaxis.  He benefited maximally from the hospital stay and there were no complications.    Recent vital signs:  Filed Vitals:   01/25/15 0452  BP: 124/59  Pulse: 65  Temp: 98.4 F (36.9 C)  Resp: 16    Recent laboratory studies:  Lab Results  Component  Value Date   HGB 10.1* 01/25/2015   HGB 12.8* 01/12/2015   HGB 12.2* 03/07/2014   Lab Results  Component Value Date   WBC 7.5 01/25/2015   PLT 147* 01/25/2015   No results found for: INR Lab Results  Component Value Date   NA 137 01/12/2015   K 4.6 01/12/2015   CL 105 01/12/2015   CO2 29 01/12/2015   BUN 33* 01/12/2015   CREATININE 1.46* 01/12/2015   GLUCOSE 146* 01/12/2015    Discharge Medications:     Medication List    TAKE these medications        ALIGN 4 MG Caps  Take 1 capsule by mouth daily.     aspirin 81 MG tablet  Take 81 mg by mouth daily.     atorvastatin 10 MG tablet  Commonly known as:  LIPITOR  - Take 10-15 mg by mouth daily. Mon Wed Fri 15 mg  - Sun Tu Th Sa 10 mg     baclofen 10 MG tablet  Commonly known as:  LIORESAL  Take 1 tablet (10 mg total) by mouth 3 (three) times daily. As needed for muscle spasm     glimepiride 2 MG tablet  Commonly known as:  AMARYL  Take 2 mg by mouth daily with breakfast.     hydrochlorothiazide 25 MG tablet  Commonly known as:  HYDRODIURIL  Take 25 mg by mouth daily.  HYDROcodone-acetaminophen 10-325 MG per tablet  Commonly known as:  NORCO  Take 1-2 tablets by mouth every 6 (six) hours as needed.     losartan 50 MG tablet  Commonly known as:  COZAAR  Take 25 mg by mouth daily.     metFORMIN 500 MG tablet  Commonly known as:  GLUCOPHAGE  Take 500-1,000 mg by mouth 2 (two) times daily with a meal. 1 tablet in the morning, 2 tablets in the evening     metoprolol 50 MG tablet  Commonly known as:  LOPRESSOR  Take 25 mg by mouth 2 (two) times daily.     multivitamin tablet  Take 1 tablet by mouth daily.     ondansetron 4 MG disintegrating tablet  Commonly known as:  ZOFRAN ODT  Take 1 tablet (4 mg total) by mouth every 8 (eight) hours as needed for nausea or vomiting.     ondansetron 4 MG tablet  Commonly known as:  ZOFRAN  Take 1 tablet (4 mg total) by mouth every 8 (eight) hours as needed for  nausea or vomiting.     OVER THE COUNTER MEDICATION  CoQ 10 100 mg capsule 1 po daily     OVER THE COUNTER MEDICATION  Folic Acid Q000111Q mg tab 1 po daily     OVER THE COUNTER MEDICATION  Vitamin B 12 1000 mcg tab 1 po daily     ranitidine 150 MG capsule  Commonly known as:  ZANTAC  Take 150 mg by mouth daily.     sennosides-docusate sodium 8.6-50 MG tablet  Commonly known as:  SENOKOT-S  Take 2 tablets by mouth daily.     SYSTANE OP  Apply 1 drop to eye 3 (three) times daily as needed (Dry eyes).     triamcinolone cream 0.1 %  Commonly known as:  KENALOG  Apply 1 application topically 2 (two) times daily.        Diagnostic Studies: Dg Shoulder Right Port  02-15-2015   CLINICAL DATA:  Patient status post reverse total shoulder arthroplasty.  EXAM: PORTABLE RIGHT SHOULDER - 2+ VIEW  COMPARISON:  CT 11/01/2014  FINDINGS: Patient status post reverse right shoulder arthroplasty. Soft tissue gas compatible with postoperative state. Single portable view demonstrates no evidence for acute osseous abnormality.  IMPRESSION: Postoperative changes compatible with reverse right shoulder arthroplasty.   Electronically Signed   By: Lovey Newcomer M.D.   On: February 15, 2015 12:42    Disposition:         Follow-up Information    Follow up with Johnny Bridge, MD. Schedule an appointment as soon as possible for a visit in 2 weeks.   Specialty:  Orthopedic Surgery   Contact information:   [place] [address] [phone]        Signed: Johnny Bridge 01/25/2015, 8:00 AM

## 2015-01-26 ENCOUNTER — Encounter (HOSPITAL_COMMUNITY): Payer: Self-pay | Admitting: Orthopedic Surgery

## 2015-01-27 DIAGNOSIS — N4889 Other specified disorders of penis: Secondary | ICD-10-CM | POA: Diagnosis not present

## 2015-02-01 DIAGNOSIS — R339 Retention of urine, unspecified: Secondary | ICD-10-CM | POA: Diagnosis not present

## 2015-02-02 DIAGNOSIS — R339 Retention of urine, unspecified: Secondary | ICD-10-CM | POA: Diagnosis not present

## 2015-02-08 DIAGNOSIS — Z96611 Presence of right artificial shoulder joint: Secondary | ICD-10-CM | POA: Diagnosis not present

## 2015-02-13 DIAGNOSIS — E780 Pure hypercholesterolemia: Secondary | ICD-10-CM | POA: Diagnosis not present

## 2015-02-13 DIAGNOSIS — Z79899 Other long term (current) drug therapy: Secondary | ICD-10-CM | POA: Diagnosis not present

## 2015-02-13 DIAGNOSIS — E119 Type 2 diabetes mellitus without complications: Secondary | ICD-10-CM | POA: Diagnosis not present

## 2015-02-20 DIAGNOSIS — I2510 Atherosclerotic heart disease of native coronary artery without angina pectoris: Secondary | ICD-10-CM | POA: Diagnosis not present

## 2015-02-20 DIAGNOSIS — E780 Pure hypercholesterolemia: Secondary | ICD-10-CM | POA: Diagnosis not present

## 2015-02-20 DIAGNOSIS — E119 Type 2 diabetes mellitus without complications: Secondary | ICD-10-CM | POA: Diagnosis not present

## 2015-02-20 DIAGNOSIS — I10 Essential (primary) hypertension: Secondary | ICD-10-CM | POA: Diagnosis not present

## 2015-02-24 DIAGNOSIS — L570 Actinic keratosis: Secondary | ICD-10-CM | POA: Diagnosis not present

## 2015-02-24 DIAGNOSIS — Z85828 Personal history of other malignant neoplasm of skin: Secondary | ICD-10-CM | POA: Diagnosis not present

## 2015-02-24 DIAGNOSIS — L821 Other seborrheic keratosis: Secondary | ICD-10-CM | POA: Diagnosis not present

## 2015-02-27 DIAGNOSIS — H01001 Unspecified blepharitis right upper eyelid: Secondary | ICD-10-CM | POA: Diagnosis not present

## 2015-02-27 DIAGNOSIS — H26103 Unspecified traumatic cataract, bilateral: Secondary | ICD-10-CM | POA: Diagnosis not present

## 2015-02-27 DIAGNOSIS — H04123 Dry eye syndrome of bilateral lacrimal glands: Secondary | ICD-10-CM | POA: Diagnosis not present

## 2015-02-27 DIAGNOSIS — E119 Type 2 diabetes mellitus without complications: Secondary | ICD-10-CM | POA: Diagnosis not present

## 2015-03-13 DIAGNOSIS — Z96611 Presence of right artificial shoulder joint: Secondary | ICD-10-CM | POA: Diagnosis not present

## 2015-03-14 DIAGNOSIS — M6281 Muscle weakness (generalized): Secondary | ICD-10-CM | POA: Diagnosis not present

## 2015-03-14 DIAGNOSIS — M25611 Stiffness of right shoulder, not elsewhere classified: Secondary | ICD-10-CM | POA: Diagnosis not present

## 2015-03-14 DIAGNOSIS — M25511 Pain in right shoulder: Secondary | ICD-10-CM | POA: Diagnosis not present

## 2015-03-14 DIAGNOSIS — Z96611 Presence of right artificial shoulder joint: Secondary | ICD-10-CM | POA: Diagnosis not present

## 2015-03-15 DIAGNOSIS — M6281 Muscle weakness (generalized): Secondary | ICD-10-CM | POA: Diagnosis not present

## 2015-03-15 DIAGNOSIS — M25511 Pain in right shoulder: Secondary | ICD-10-CM | POA: Diagnosis not present

## 2015-03-15 DIAGNOSIS — Z96611 Presence of right artificial shoulder joint: Secondary | ICD-10-CM | POA: Diagnosis not present

## 2015-03-15 DIAGNOSIS — M25611 Stiffness of right shoulder, not elsewhere classified: Secondary | ICD-10-CM | POA: Diagnosis not present

## 2015-03-17 DIAGNOSIS — M6281 Muscle weakness (generalized): Secondary | ICD-10-CM | POA: Diagnosis not present

## 2015-03-17 DIAGNOSIS — M25611 Stiffness of right shoulder, not elsewhere classified: Secondary | ICD-10-CM | POA: Diagnosis not present

## 2015-03-17 DIAGNOSIS — M25511 Pain in right shoulder: Secondary | ICD-10-CM | POA: Diagnosis not present

## 2015-03-17 DIAGNOSIS — Z96611 Presence of right artificial shoulder joint: Secondary | ICD-10-CM | POA: Diagnosis not present

## 2015-03-20 DIAGNOSIS — M6281 Muscle weakness (generalized): Secondary | ICD-10-CM | POA: Diagnosis not present

## 2015-03-20 DIAGNOSIS — M25611 Stiffness of right shoulder, not elsewhere classified: Secondary | ICD-10-CM | POA: Diagnosis not present

## 2015-03-20 DIAGNOSIS — Z96611 Presence of right artificial shoulder joint: Secondary | ICD-10-CM | POA: Diagnosis not present

## 2015-03-20 DIAGNOSIS — M25511 Pain in right shoulder: Secondary | ICD-10-CM | POA: Diagnosis not present

## 2015-03-20 DIAGNOSIS — R262 Difficulty in walking, not elsewhere classified: Secondary | ICD-10-CM | POA: Diagnosis not present

## 2015-03-22 DIAGNOSIS — Z96611 Presence of right artificial shoulder joint: Secondary | ICD-10-CM | POA: Diagnosis not present

## 2015-03-22 DIAGNOSIS — R262 Difficulty in walking, not elsewhere classified: Secondary | ICD-10-CM | POA: Diagnosis not present

## 2015-03-22 DIAGNOSIS — M6281 Muscle weakness (generalized): Secondary | ICD-10-CM | POA: Diagnosis not present

## 2015-03-22 DIAGNOSIS — M25611 Stiffness of right shoulder, not elsewhere classified: Secondary | ICD-10-CM | POA: Diagnosis not present

## 2015-03-22 DIAGNOSIS — M25511 Pain in right shoulder: Secondary | ICD-10-CM | POA: Diagnosis not present

## 2015-03-24 DIAGNOSIS — M6281 Muscle weakness (generalized): Secondary | ICD-10-CM | POA: Diagnosis not present

## 2015-03-24 DIAGNOSIS — M25611 Stiffness of right shoulder, not elsewhere classified: Secondary | ICD-10-CM | POA: Diagnosis not present

## 2015-03-24 DIAGNOSIS — Z96611 Presence of right artificial shoulder joint: Secondary | ICD-10-CM | POA: Diagnosis not present

## 2015-03-24 DIAGNOSIS — R262 Difficulty in walking, not elsewhere classified: Secondary | ICD-10-CM | POA: Diagnosis not present

## 2015-03-24 DIAGNOSIS — M25511 Pain in right shoulder: Secondary | ICD-10-CM | POA: Diagnosis not present

## 2015-03-28 DIAGNOSIS — M25611 Stiffness of right shoulder, not elsewhere classified: Secondary | ICD-10-CM | POA: Diagnosis not present

## 2015-03-28 DIAGNOSIS — M25511 Pain in right shoulder: Secondary | ICD-10-CM | POA: Diagnosis not present

## 2015-03-28 DIAGNOSIS — R262 Difficulty in walking, not elsewhere classified: Secondary | ICD-10-CM | POA: Diagnosis not present

## 2015-03-28 DIAGNOSIS — M6281 Muscle weakness (generalized): Secondary | ICD-10-CM | POA: Diagnosis not present

## 2015-03-28 DIAGNOSIS — Z96611 Presence of right artificial shoulder joint: Secondary | ICD-10-CM | POA: Diagnosis not present

## 2015-03-30 DIAGNOSIS — Z96611 Presence of right artificial shoulder joint: Secondary | ICD-10-CM | POA: Diagnosis not present

## 2015-03-30 DIAGNOSIS — M6281 Muscle weakness (generalized): Secondary | ICD-10-CM | POA: Diagnosis not present

## 2015-03-30 DIAGNOSIS — M25611 Stiffness of right shoulder, not elsewhere classified: Secondary | ICD-10-CM | POA: Diagnosis not present

## 2015-03-30 DIAGNOSIS — R262 Difficulty in walking, not elsewhere classified: Secondary | ICD-10-CM | POA: Diagnosis not present

## 2015-03-30 DIAGNOSIS — M25511 Pain in right shoulder: Secondary | ICD-10-CM | POA: Diagnosis not present

## 2015-03-31 DIAGNOSIS — Z96611 Presence of right artificial shoulder joint: Secondary | ICD-10-CM | POA: Diagnosis not present

## 2015-03-31 DIAGNOSIS — M25611 Stiffness of right shoulder, not elsewhere classified: Secondary | ICD-10-CM | POA: Diagnosis not present

## 2015-03-31 DIAGNOSIS — M25511 Pain in right shoulder: Secondary | ICD-10-CM | POA: Diagnosis not present

## 2015-03-31 DIAGNOSIS — R262 Difficulty in walking, not elsewhere classified: Secondary | ICD-10-CM | POA: Diagnosis not present

## 2015-03-31 DIAGNOSIS — M6281 Muscle weakness (generalized): Secondary | ICD-10-CM | POA: Diagnosis not present

## 2015-04-03 DIAGNOSIS — M25511 Pain in right shoulder: Secondary | ICD-10-CM | POA: Diagnosis not present

## 2015-04-03 DIAGNOSIS — R262 Difficulty in walking, not elsewhere classified: Secondary | ICD-10-CM | POA: Diagnosis not present

## 2015-04-03 DIAGNOSIS — Z96611 Presence of right artificial shoulder joint: Secondary | ICD-10-CM | POA: Diagnosis not present

## 2015-04-03 DIAGNOSIS — M25611 Stiffness of right shoulder, not elsewhere classified: Secondary | ICD-10-CM | POA: Diagnosis not present

## 2015-04-03 DIAGNOSIS — M6281 Muscle weakness (generalized): Secondary | ICD-10-CM | POA: Diagnosis not present

## 2015-04-05 DIAGNOSIS — M6281 Muscle weakness (generalized): Secondary | ICD-10-CM | POA: Diagnosis not present

## 2015-04-05 DIAGNOSIS — Z96611 Presence of right artificial shoulder joint: Secondary | ICD-10-CM | POA: Diagnosis not present

## 2015-04-05 DIAGNOSIS — M25511 Pain in right shoulder: Secondary | ICD-10-CM | POA: Diagnosis not present

## 2015-04-05 DIAGNOSIS — M25611 Stiffness of right shoulder, not elsewhere classified: Secondary | ICD-10-CM | POA: Diagnosis not present

## 2015-04-07 DIAGNOSIS — M6281 Muscle weakness (generalized): Secondary | ICD-10-CM | POA: Diagnosis not present

## 2015-04-07 DIAGNOSIS — M25511 Pain in right shoulder: Secondary | ICD-10-CM | POA: Diagnosis not present

## 2015-04-07 DIAGNOSIS — M25611 Stiffness of right shoulder, not elsewhere classified: Secondary | ICD-10-CM | POA: Diagnosis not present

## 2015-04-07 DIAGNOSIS — Z96611 Presence of right artificial shoulder joint: Secondary | ICD-10-CM | POA: Diagnosis not present

## 2015-04-10 DIAGNOSIS — Z96611 Presence of right artificial shoulder joint: Secondary | ICD-10-CM | POA: Diagnosis not present

## 2015-04-11 DIAGNOSIS — M25611 Stiffness of right shoulder, not elsewhere classified: Secondary | ICD-10-CM | POA: Diagnosis not present

## 2015-04-11 DIAGNOSIS — Z96611 Presence of right artificial shoulder joint: Secondary | ICD-10-CM | POA: Diagnosis not present

## 2015-04-11 DIAGNOSIS — M25511 Pain in right shoulder: Secondary | ICD-10-CM | POA: Diagnosis not present

## 2015-04-11 DIAGNOSIS — M6281 Muscle weakness (generalized): Secondary | ICD-10-CM | POA: Diagnosis not present

## 2015-04-13 DIAGNOSIS — M25511 Pain in right shoulder: Secondary | ICD-10-CM | POA: Diagnosis not present

## 2015-04-13 DIAGNOSIS — Z96611 Presence of right artificial shoulder joint: Secondary | ICD-10-CM | POA: Diagnosis not present

## 2015-04-13 DIAGNOSIS — M6281 Muscle weakness (generalized): Secondary | ICD-10-CM | POA: Diagnosis not present

## 2015-04-13 DIAGNOSIS — M25611 Stiffness of right shoulder, not elsewhere classified: Secondary | ICD-10-CM | POA: Diagnosis not present

## 2015-04-14 DIAGNOSIS — L570 Actinic keratosis: Secondary | ICD-10-CM | POA: Diagnosis not present

## 2015-04-14 DIAGNOSIS — L821 Other seborrheic keratosis: Secondary | ICD-10-CM | POA: Diagnosis not present

## 2015-04-14 DIAGNOSIS — Z85828 Personal history of other malignant neoplasm of skin: Secondary | ICD-10-CM | POA: Diagnosis not present

## 2015-04-18 DIAGNOSIS — M6281 Muscle weakness (generalized): Secondary | ICD-10-CM | POA: Diagnosis not present

## 2015-04-18 DIAGNOSIS — Z96611 Presence of right artificial shoulder joint: Secondary | ICD-10-CM | POA: Diagnosis not present

## 2015-04-18 DIAGNOSIS — M25511 Pain in right shoulder: Secondary | ICD-10-CM | POA: Diagnosis not present

## 2015-04-18 DIAGNOSIS — M25611 Stiffness of right shoulder, not elsewhere classified: Secondary | ICD-10-CM | POA: Diagnosis not present

## 2015-04-20 DIAGNOSIS — M25511 Pain in right shoulder: Secondary | ICD-10-CM | POA: Diagnosis not present

## 2015-04-20 DIAGNOSIS — Z96611 Presence of right artificial shoulder joint: Secondary | ICD-10-CM | POA: Diagnosis not present

## 2015-04-20 DIAGNOSIS — M6281 Muscle weakness (generalized): Secondary | ICD-10-CM | POA: Diagnosis not present

## 2015-04-20 DIAGNOSIS — M25611 Stiffness of right shoulder, not elsewhere classified: Secondary | ICD-10-CM | POA: Diagnosis not present

## 2015-04-21 DIAGNOSIS — I252 Old myocardial infarction: Secondary | ICD-10-CM | POA: Diagnosis not present

## 2015-04-21 DIAGNOSIS — I2510 Atherosclerotic heart disease of native coronary artery without angina pectoris: Secondary | ICD-10-CM | POA: Diagnosis not present

## 2015-04-21 DIAGNOSIS — E119 Type 2 diabetes mellitus without complications: Secondary | ICD-10-CM | POA: Diagnosis not present

## 2015-04-21 DIAGNOSIS — Z01810 Encounter for preprocedural cardiovascular examination: Secondary | ICD-10-CM | POA: Diagnosis not present

## 2015-04-21 DIAGNOSIS — E785 Hyperlipidemia, unspecified: Secondary | ICD-10-CM | POA: Diagnosis not present

## 2015-04-24 DIAGNOSIS — M25511 Pain in right shoulder: Secondary | ICD-10-CM | POA: Diagnosis not present

## 2015-04-24 DIAGNOSIS — M25611 Stiffness of right shoulder, not elsewhere classified: Secondary | ICD-10-CM | POA: Diagnosis not present

## 2015-04-24 DIAGNOSIS — Z96611 Presence of right artificial shoulder joint: Secondary | ICD-10-CM | POA: Diagnosis not present

## 2015-04-24 DIAGNOSIS — M6281 Muscle weakness (generalized): Secondary | ICD-10-CM | POA: Diagnosis not present

## 2015-04-28 DIAGNOSIS — M6281 Muscle weakness (generalized): Secondary | ICD-10-CM | POA: Diagnosis not present

## 2015-04-28 DIAGNOSIS — M25611 Stiffness of right shoulder, not elsewhere classified: Secondary | ICD-10-CM | POA: Diagnosis not present

## 2015-04-28 DIAGNOSIS — Z96611 Presence of right artificial shoulder joint: Secondary | ICD-10-CM | POA: Diagnosis not present

## 2015-04-28 DIAGNOSIS — M25511 Pain in right shoulder: Secondary | ICD-10-CM | POA: Diagnosis not present

## 2015-05-01 DIAGNOSIS — M25511 Pain in right shoulder: Secondary | ICD-10-CM | POA: Diagnosis not present

## 2015-05-01 DIAGNOSIS — M6281 Muscle weakness (generalized): Secondary | ICD-10-CM | POA: Diagnosis not present

## 2015-05-01 DIAGNOSIS — M25611 Stiffness of right shoulder, not elsewhere classified: Secondary | ICD-10-CM | POA: Diagnosis not present

## 2015-05-01 DIAGNOSIS — Z96611 Presence of right artificial shoulder joint: Secondary | ICD-10-CM | POA: Diagnosis not present

## 2015-05-04 DIAGNOSIS — M6281 Muscle weakness (generalized): Secondary | ICD-10-CM | POA: Diagnosis not present

## 2015-05-04 DIAGNOSIS — Z96611 Presence of right artificial shoulder joint: Secondary | ICD-10-CM | POA: Diagnosis not present

## 2015-05-04 DIAGNOSIS — M25611 Stiffness of right shoulder, not elsewhere classified: Secondary | ICD-10-CM | POA: Diagnosis not present

## 2015-05-04 DIAGNOSIS — M25511 Pain in right shoulder: Secondary | ICD-10-CM | POA: Diagnosis not present

## 2015-05-09 DIAGNOSIS — Z96611 Presence of right artificial shoulder joint: Secondary | ICD-10-CM | POA: Diagnosis not present

## 2015-06-02 DIAGNOSIS — L720 Epidermal cyst: Secondary | ICD-10-CM | POA: Diagnosis not present

## 2015-06-02 DIAGNOSIS — Z85828 Personal history of other malignant neoplasm of skin: Secondary | ICD-10-CM | POA: Diagnosis not present

## 2015-06-20 DIAGNOSIS — Z96611 Presence of right artificial shoulder joint: Secondary | ICD-10-CM | POA: Diagnosis not present

## 2015-07-13 ENCOUNTER — Ambulatory Visit
Admission: RE | Admit: 2015-07-13 | Discharge: 2015-07-13 | Disposition: A | Discharge status: Home / Self Care | Payer: Medicare Other | Source: Ambulatory Visit | Attending: Gastroenterology | Admitting: Gastroenterology

## 2015-07-13 ENCOUNTER — Other Ambulatory Visit: Payer: Self-pay | Admitting: Gastroenterology

## 2015-07-13 DIAGNOSIS — K5909 Other constipation: Secondary | ICD-10-CM

## 2015-07-13 DIAGNOSIS — R112 Nausea with vomiting, unspecified: Secondary | ICD-10-CM

## 2015-07-13 DIAGNOSIS — R195 Other fecal abnormalities: Secondary | ICD-10-CM | POA: Diagnosis not present

## 2015-07-13 DIAGNOSIS — R110 Nausea: Secondary | ICD-10-CM | POA: Diagnosis not present

## 2015-07-31 DIAGNOSIS — L723 Sebaceous cyst: Secondary | ICD-10-CM | POA: Diagnosis not present

## 2015-07-31 DIAGNOSIS — L570 Actinic keratosis: Secondary | ICD-10-CM | POA: Diagnosis not present

## 2015-07-31 DIAGNOSIS — L821 Other seborrheic keratosis: Secondary | ICD-10-CM | POA: Diagnosis not present

## 2015-07-31 DIAGNOSIS — Z85828 Personal history of other malignant neoplasm of skin: Secondary | ICD-10-CM | POA: Diagnosis not present

## 2015-08-10 DIAGNOSIS — L720 Epidermal cyst: Secondary | ICD-10-CM | POA: Diagnosis not present

## 2015-08-10 DIAGNOSIS — Z85828 Personal history of other malignant neoplasm of skin: Secondary | ICD-10-CM | POA: Diagnosis not present

## 2015-08-15 DIAGNOSIS — Z79899 Other long term (current) drug therapy: Secondary | ICD-10-CM | POA: Diagnosis not present

## 2015-08-15 DIAGNOSIS — E119 Type 2 diabetes mellitus without complications: Secondary | ICD-10-CM | POA: Diagnosis not present

## 2015-08-15 DIAGNOSIS — E780 Pure hypercholesterolemia: Secondary | ICD-10-CM | POA: Diagnosis not present

## 2015-08-16 DIAGNOSIS — Z4802 Encounter for removal of sutures: Secondary | ICD-10-CM | POA: Diagnosis not present

## 2015-08-22 DIAGNOSIS — Z0000 Encounter for general adult medical examination without abnormal findings: Secondary | ICD-10-CM | POA: Diagnosis not present

## 2015-08-22 DIAGNOSIS — E119 Type 2 diabetes mellitus without complications: Secondary | ICD-10-CM | POA: Diagnosis not present

## 2015-08-22 DIAGNOSIS — E7800 Pure hypercholesterolemia, unspecified: Secondary | ICD-10-CM | POA: Diagnosis not present

## 2015-08-22 DIAGNOSIS — Z79899 Other long term (current) drug therapy: Secondary | ICD-10-CM | POA: Diagnosis not present

## 2015-08-29 DIAGNOSIS — M79609 Pain in unspecified limb: Secondary | ICD-10-CM | POA: Diagnosis not present

## 2015-08-29 DIAGNOSIS — L03031 Cellulitis of right toe: Secondary | ICD-10-CM | POA: Diagnosis not present

## 2015-09-08 ENCOUNTER — Other Ambulatory Visit: Payer: Self-pay | Admitting: Gastroenterology

## 2015-09-08 ENCOUNTER — Ambulatory Visit
Admission: RE | Admit: 2015-09-08 | Discharge: 2015-09-08 | Disposition: A | Discharge status: Home / Self Care | Payer: Medicare Other | Source: Ambulatory Visit | Attending: Gastroenterology | Admitting: Gastroenterology

## 2015-09-08 DIAGNOSIS — K5900 Constipation, unspecified: Secondary | ICD-10-CM | POA: Diagnosis not present

## 2015-09-11 ENCOUNTER — Ambulatory Visit
Admission: RE | Admit: 2015-09-11 | Discharge: 2015-09-11 | Disposition: A | Discharge status: Home / Self Care | Payer: Medicare Other | Source: Ambulatory Visit | Attending: Gastroenterology | Admitting: Gastroenterology

## 2015-09-11 ENCOUNTER — Other Ambulatory Visit: Payer: Self-pay | Admitting: Gastroenterology

## 2015-09-11 DIAGNOSIS — K5900 Constipation, unspecified: Secondary | ICD-10-CM

## 2015-09-12 DIAGNOSIS — T783XXA Angioneurotic edema, initial encounter: Secondary | ICD-10-CM | POA: Diagnosis not present

## 2015-09-13 ENCOUNTER — Other Ambulatory Visit: Payer: Self-pay | Admitting: Gastroenterology

## 2015-09-13 ENCOUNTER — Ambulatory Visit
Admission: RE | Admit: 2015-09-13 | Discharge: 2015-09-13 | Disposition: A | Discharge status: Home / Self Care | Payer: Medicare Other | Source: Ambulatory Visit | Attending: Gastroenterology | Admitting: Gastroenterology

## 2015-09-13 DIAGNOSIS — K5900 Constipation, unspecified: Secondary | ICD-10-CM

## 2015-09-13 DIAGNOSIS — R198 Other specified symptoms and signs involving the digestive system and abdomen: Secondary | ICD-10-CM | POA: Diagnosis not present

## 2015-09-17 DIAGNOSIS — Z23 Encounter for immunization: Secondary | ICD-10-CM | POA: Diagnosis not present

## 2015-09-20 DIAGNOSIS — Z96611 Presence of right artificial shoulder joint: Secondary | ICD-10-CM | POA: Diagnosis not present

## 2015-10-05 DIAGNOSIS — R140 Abdominal distension (gaseous): Secondary | ICD-10-CM | POA: Diagnosis not present

## 2015-10-05 DIAGNOSIS — R112 Nausea with vomiting, unspecified: Secondary | ICD-10-CM | POA: Diagnosis not present

## 2015-10-05 DIAGNOSIS — R630 Anorexia: Secondary | ICD-10-CM | POA: Diagnosis not present

## 2015-10-05 DIAGNOSIS — R197 Diarrhea, unspecified: Secondary | ICD-10-CM | POA: Diagnosis not present

## 2015-12-14 DIAGNOSIS — L820 Inflamed seborrheic keratosis: Secondary | ICD-10-CM | POA: Diagnosis not present

## 2015-12-14 DIAGNOSIS — Z85828 Personal history of other malignant neoplasm of skin: Secondary | ICD-10-CM | POA: Diagnosis not present

## 2015-12-14 DIAGNOSIS — L821 Other seborrheic keratosis: Secondary | ICD-10-CM | POA: Diagnosis not present

## 2015-12-14 DIAGNOSIS — D485 Neoplasm of uncertain behavior of skin: Secondary | ICD-10-CM | POA: Diagnosis not present

## 2015-12-14 DIAGNOSIS — D1801 Hemangioma of skin and subcutaneous tissue: Secondary | ICD-10-CM | POA: Diagnosis not present

## 2015-12-14 DIAGNOSIS — C44622 Squamous cell carcinoma of skin of right upper limb, including shoulder: Secondary | ICD-10-CM | POA: Diagnosis not present

## 2015-12-14 DIAGNOSIS — L570 Actinic keratosis: Secondary | ICD-10-CM | POA: Diagnosis not present

## 2015-12-21 DIAGNOSIS — Z96611 Presence of right artificial shoulder joint: Secondary | ICD-10-CM | POA: Diagnosis not present

## 2016-01-01 DIAGNOSIS — K5900 Constipation, unspecified: Secondary | ICD-10-CM | POA: Diagnosis not present

## 2016-01-04 DIAGNOSIS — H25813 Combined forms of age-related cataract, bilateral: Secondary | ICD-10-CM | POA: Diagnosis not present

## 2016-01-04 DIAGNOSIS — H52203 Unspecified astigmatism, bilateral: Secondary | ICD-10-CM | POA: Diagnosis not present

## 2016-01-04 DIAGNOSIS — H04123 Dry eye syndrome of bilateral lacrimal glands: Secondary | ICD-10-CM | POA: Diagnosis not present

## 2016-01-04 DIAGNOSIS — E119 Type 2 diabetes mellitus without complications: Secondary | ICD-10-CM | POA: Diagnosis not present

## 2016-01-10 DIAGNOSIS — N138 Other obstructive and reflux uropathy: Secondary | ICD-10-CM | POA: Diagnosis not present

## 2016-01-10 DIAGNOSIS — Z0000 Encounter for general adult medical examination without abnormal findings: Secondary | ICD-10-CM | POA: Diagnosis not present

## 2016-01-10 DIAGNOSIS — Q6101 Congenital single renal cyst: Secondary | ICD-10-CM | POA: Diagnosis not present

## 2016-01-10 DIAGNOSIS — N5201 Erectile dysfunction due to arterial insufficiency: Secondary | ICD-10-CM | POA: Diagnosis not present

## 2016-01-10 DIAGNOSIS — N401 Enlarged prostate with lower urinary tract symptoms: Secondary | ICD-10-CM | POA: Diagnosis not present

## 2016-02-02 DIAGNOSIS — E7800 Pure hypercholesterolemia, unspecified: Secondary | ICD-10-CM | POA: Diagnosis not present

## 2016-02-02 DIAGNOSIS — E119 Type 2 diabetes mellitus without complications: Secondary | ICD-10-CM | POA: Diagnosis not present

## 2016-02-02 DIAGNOSIS — Z79899 Other long term (current) drug therapy: Secondary | ICD-10-CM | POA: Diagnosis not present

## 2016-02-06 DIAGNOSIS — H2181 Floppy iris syndrome: Secondary | ICD-10-CM | POA: Diagnosis not present

## 2016-02-06 DIAGNOSIS — H25811 Combined forms of age-related cataract, right eye: Secondary | ICD-10-CM | POA: Diagnosis not present

## 2016-02-06 DIAGNOSIS — H2511 Age-related nuclear cataract, right eye: Secondary | ICD-10-CM | POA: Diagnosis not present

## 2016-02-06 DIAGNOSIS — H25041 Posterior subcapsular polar age-related cataract, right eye: Secondary | ICD-10-CM | POA: Diagnosis not present

## 2016-02-06 DIAGNOSIS — H25011 Cortical age-related cataract, right eye: Secondary | ICD-10-CM | POA: Diagnosis not present

## 2016-02-20 DIAGNOSIS — Z79899 Other long term (current) drug therapy: Secondary | ICD-10-CM | POA: Diagnosis not present

## 2016-02-20 DIAGNOSIS — I2510 Atherosclerotic heart disease of native coronary artery without angina pectoris: Secondary | ICD-10-CM | POA: Diagnosis not present

## 2016-02-20 DIAGNOSIS — I10 Essential (primary) hypertension: Secondary | ICD-10-CM | POA: Diagnosis not present

## 2016-02-20 DIAGNOSIS — E119 Type 2 diabetes mellitus without complications: Secondary | ICD-10-CM | POA: Diagnosis not present

## 2016-02-20 DIAGNOSIS — E7800 Pure hypercholesterolemia, unspecified: Secondary | ICD-10-CM | POA: Diagnosis not present

## 2016-02-27 DIAGNOSIS — H2512 Age-related nuclear cataract, left eye: Secondary | ICD-10-CM | POA: Diagnosis not present

## 2016-02-27 DIAGNOSIS — H25812 Combined forms of age-related cataract, left eye: Secondary | ICD-10-CM | POA: Diagnosis not present

## 2016-02-27 DIAGNOSIS — H2181 Floppy iris syndrome: Secondary | ICD-10-CM | POA: Diagnosis not present

## 2016-02-27 DIAGNOSIS — H25012 Cortical age-related cataract, left eye: Secondary | ICD-10-CM | POA: Diagnosis not present

## 2016-03-12 DIAGNOSIS — T783XXA Angioneurotic edema, initial encounter: Secondary | ICD-10-CM | POA: Diagnosis not present

## 2016-03-16 IMAGING — CR DG ABDOMEN 1V
1 series · 1 of 1 positions shown · non-contrast
Comparison: None.

CLINICAL DATA: Sitz markers

EXAM:
ABDOMEN - 1 VIEW

[t abdomen supine]
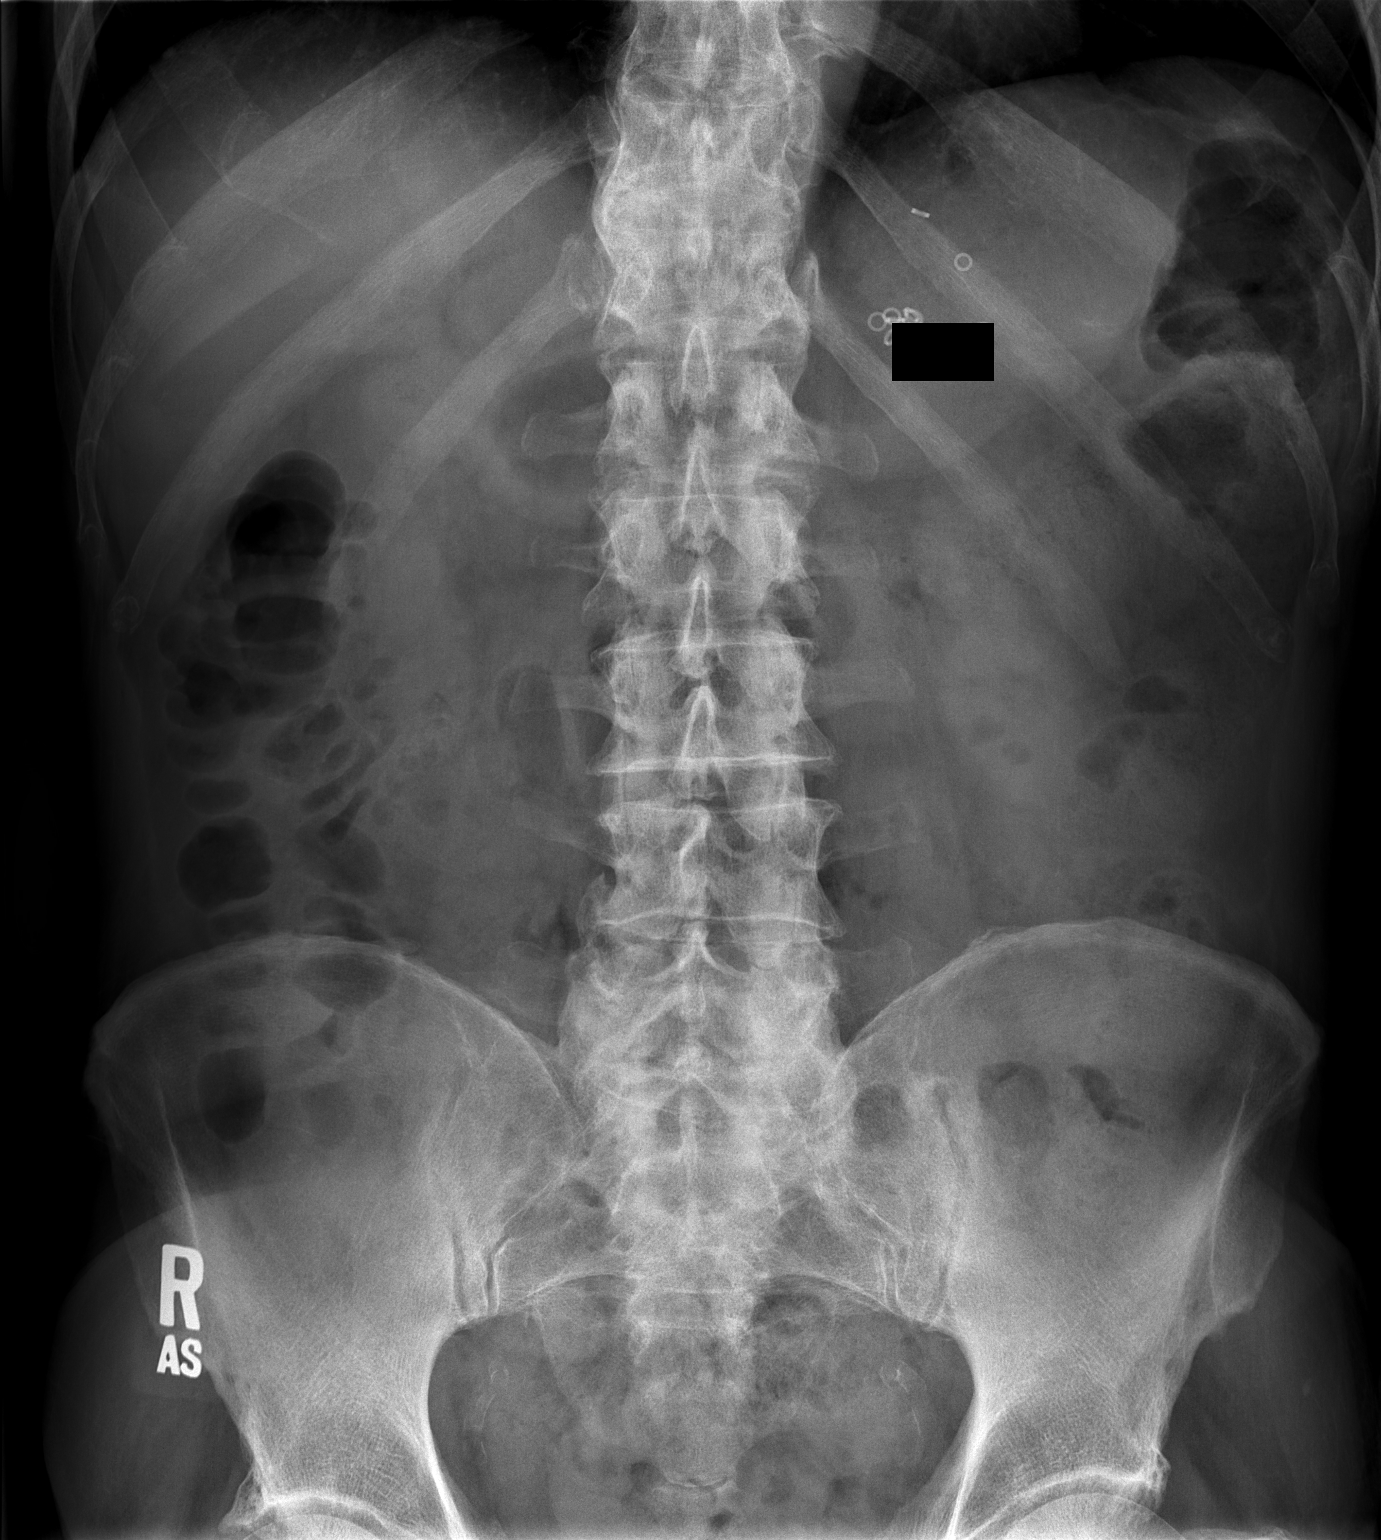

[1 of 1 positions shown; findings below may reference images not displayed]

FINDINGS: Multiple markers project over the left upper quadrant likely in the
stomach. No disproportionate dilatation of bowel. Vascular
calcifications are noted.
IMPRESSION: Sitz markers are in the stomach.

## 2016-03-19 IMAGING — CR DG ABDOMEN 1V
1 series · 1 of 1 positions shown · non-contrast
Comparison: 09/08/2015

CLINICAL DATA: Sitz marker study.  Day 3.

EXAM:
ABDOMEN - 1 VIEW

[t abdomen supine]
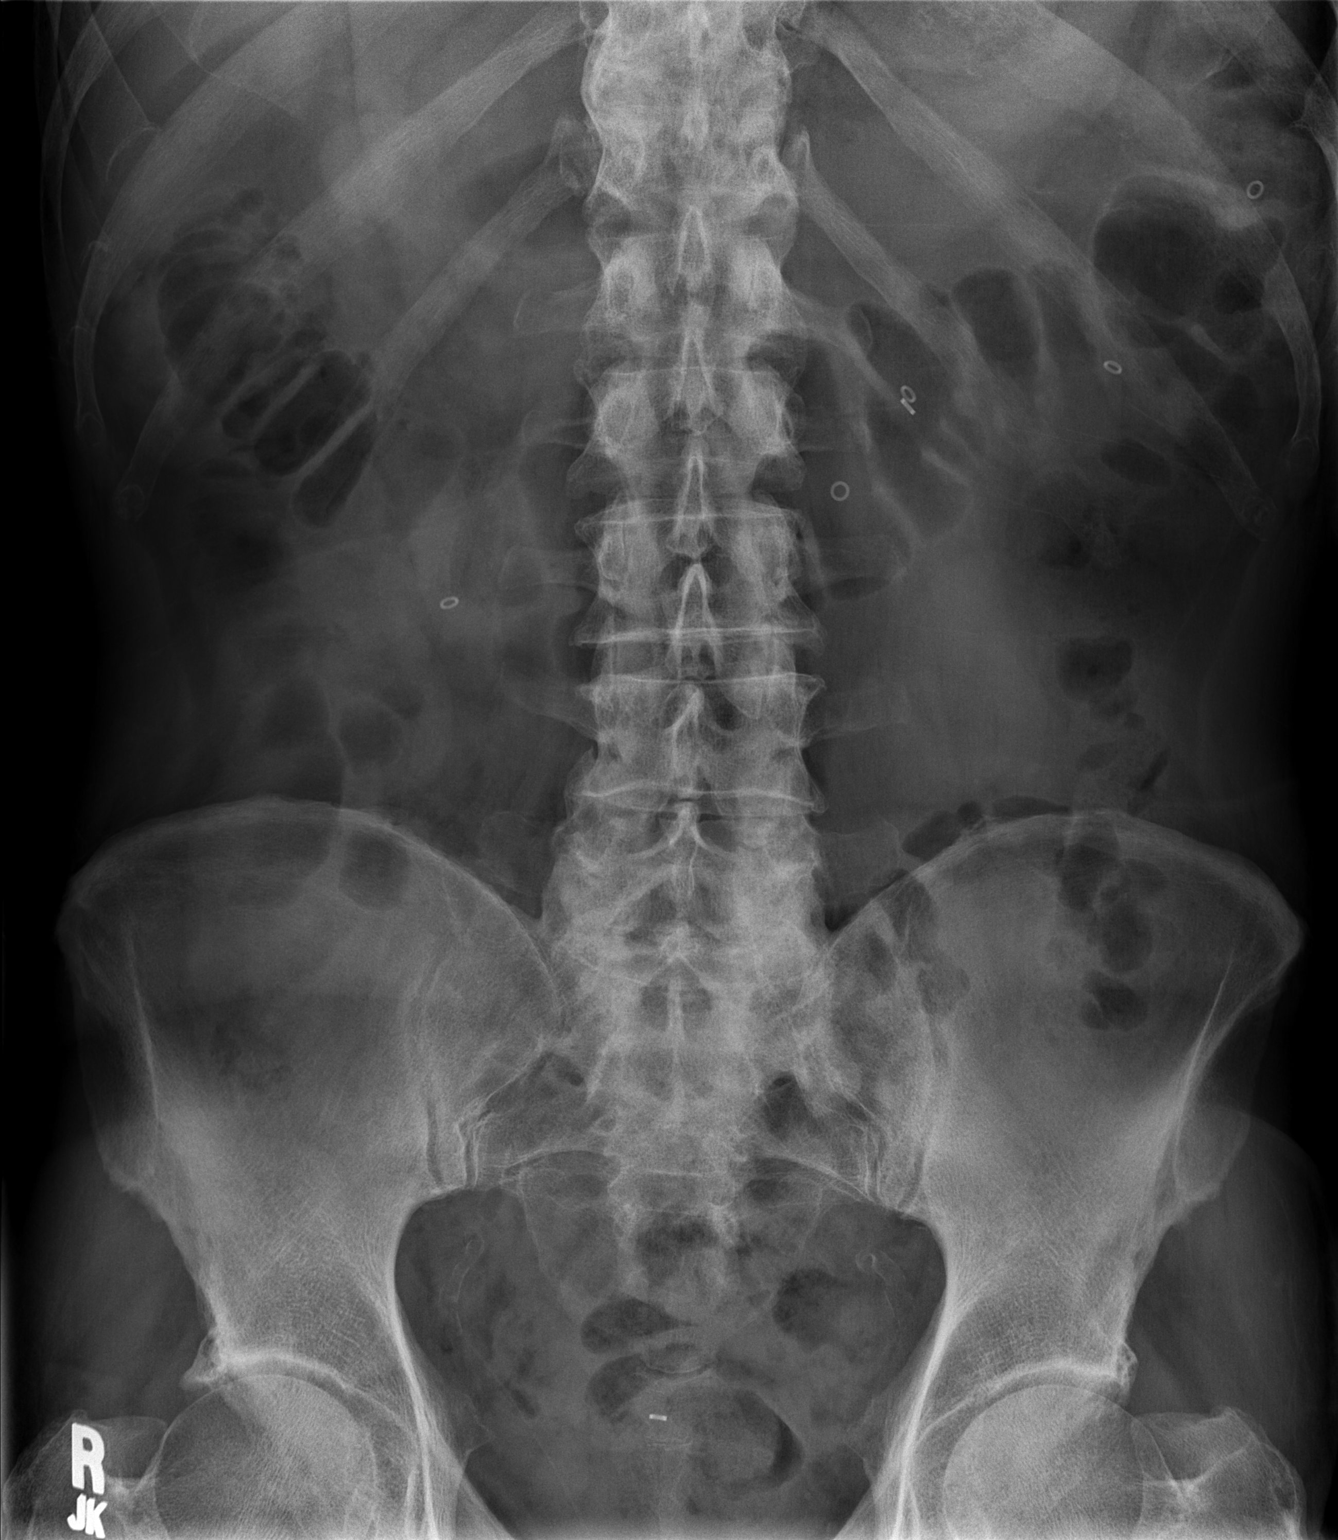

[1 of 1 positions shown; findings below may reference images not displayed]

FINDINGS: There are 6 markers identified in the transverse colon. One marker
is identified in the rectum. The bowel gas pattern is unremarkable.
IMPRESSION: 1. A total of 7 Sitz markers remain.

## 2016-03-21 IMAGING — CR DG ABDOMEN 1V
1 series · 1 of 1 positions shown · non-contrast
Comparison: None.

CLINICAL DATA: Followup Sitz marker day 5

EXAM:
ABDOMEN - 1 VIEW

[view not recorded]
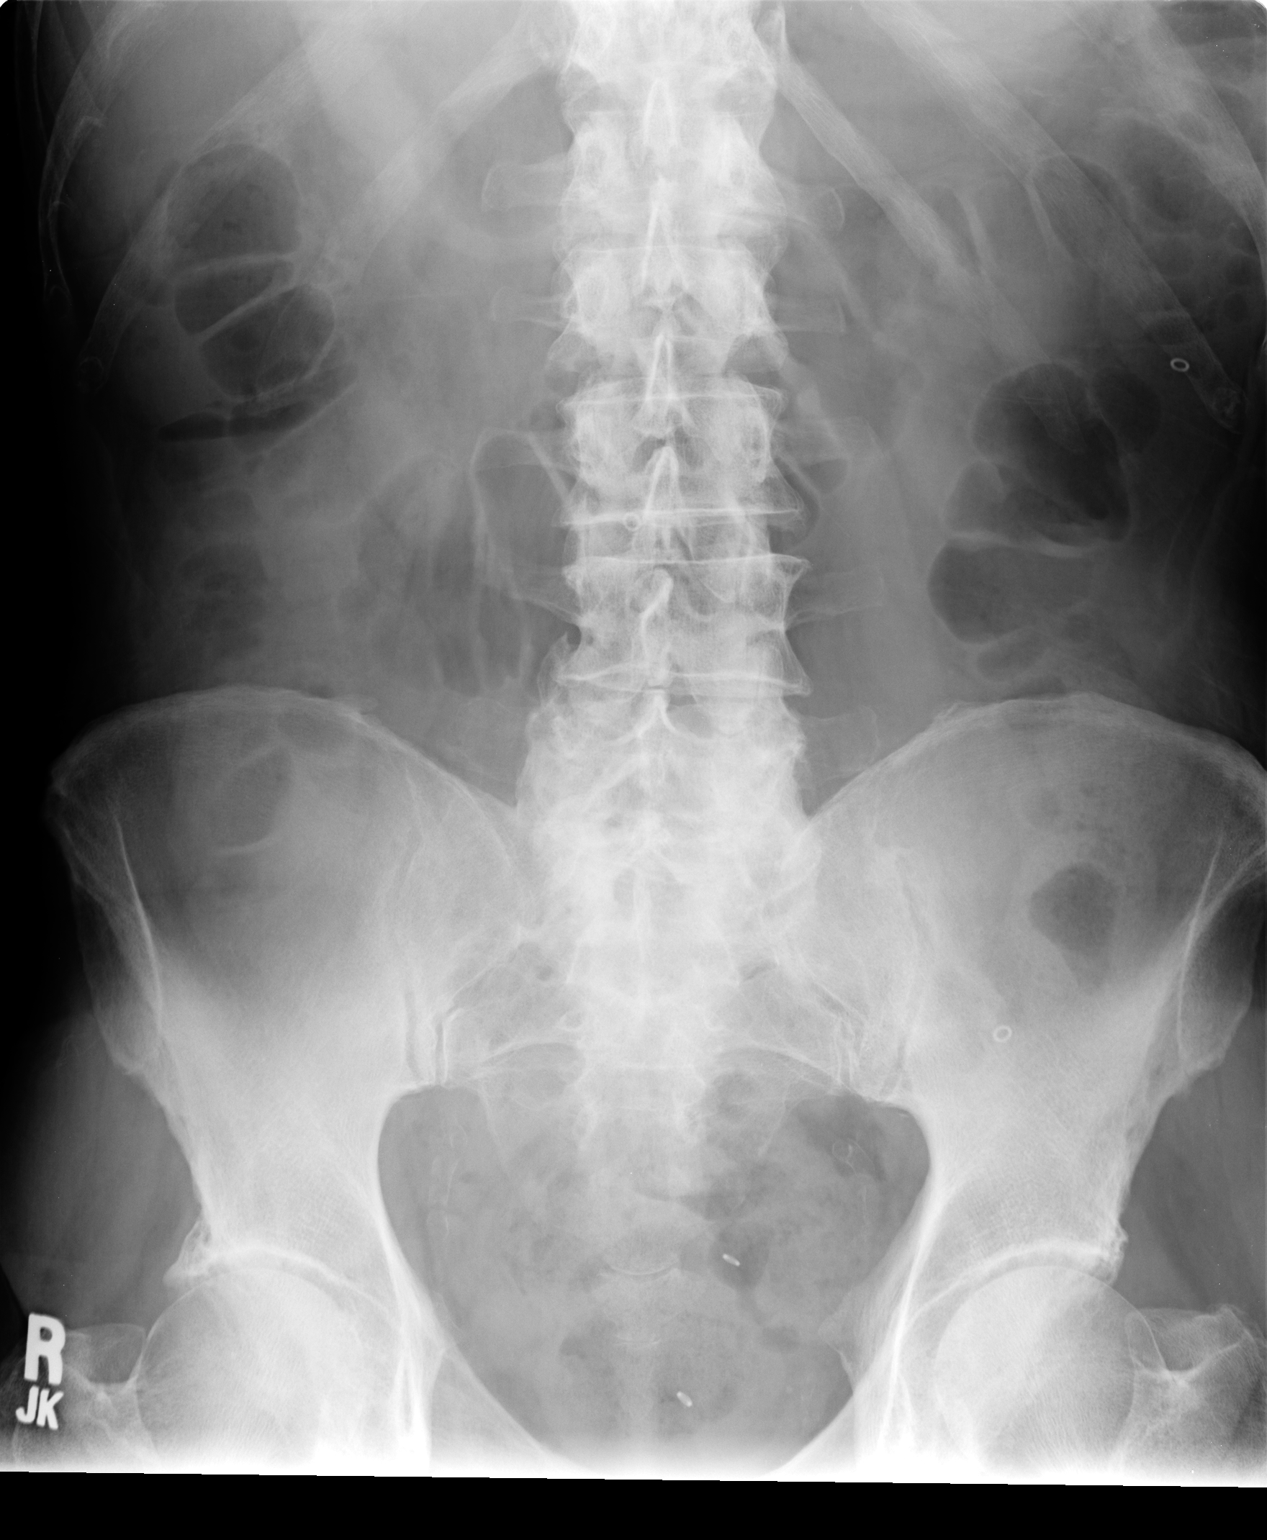

[1 of 1 positions shown; findings below may reference images not displayed]

FINDINGS: There is normal bowel gas pattern. Some colonic gas is noted. Two
Sitz markers are noted in descending colon. Two Sitz markers are
noted in distal sigmoid colon.
IMPRESSION: Some colonic gas is noted. Two Sitz markers are noted in descending
colon. Two Sitz markers are noted in distal sigmoid colon.

## 2016-03-28 DIAGNOSIS — J305 Allergic rhinitis due to food: Secondary | ICD-10-CM | POA: Diagnosis not present

## 2016-03-28 DIAGNOSIS — T783XXA Angioneurotic edema, initial encounter: Secondary | ICD-10-CM | POA: Diagnosis not present

## 2016-04-26 DIAGNOSIS — E119 Type 2 diabetes mellitus without complications: Secondary | ICD-10-CM | POA: Diagnosis not present

## 2016-04-26 DIAGNOSIS — E785 Hyperlipidemia, unspecified: Secondary | ICD-10-CM | POA: Diagnosis not present

## 2016-04-26 DIAGNOSIS — I2510 Atherosclerotic heart disease of native coronary artery without angina pectoris: Secondary | ICD-10-CM | POA: Diagnosis not present

## 2016-04-26 DIAGNOSIS — I252 Old myocardial infarction: Secondary | ICD-10-CM | POA: Diagnosis not present

## 2016-05-31 DIAGNOSIS — K146 Glossodynia: Secondary | ICD-10-CM | POA: Diagnosis not present

## 2016-06-13 DIAGNOSIS — Z85828 Personal history of other malignant neoplasm of skin: Secondary | ICD-10-CM | POA: Diagnosis not present

## 2016-06-13 DIAGNOSIS — L218 Other seborrheic dermatitis: Secondary | ICD-10-CM | POA: Diagnosis not present

## 2016-06-13 DIAGNOSIS — L821 Other seborrheic keratosis: Secondary | ICD-10-CM | POA: Diagnosis not present

## 2016-06-13 DIAGNOSIS — L570 Actinic keratosis: Secondary | ICD-10-CM | POA: Diagnosis not present

## 2016-07-12 ENCOUNTER — Other Ambulatory Visit: Payer: Self-pay

## 2016-07-19 DIAGNOSIS — D101 Benign neoplasm of tongue: Secondary | ICD-10-CM | POA: Diagnosis not present

## 2016-08-16 DIAGNOSIS — K1329 Other disturbances of oral epithelium, including tongue: Secondary | ICD-10-CM | POA: Diagnosis not present

## 2016-08-16 DIAGNOSIS — J301 Allergic rhinitis due to pollen: Secondary | ICD-10-CM | POA: Diagnosis not present

## 2016-08-16 DIAGNOSIS — D101 Benign neoplasm of tongue: Secondary | ICD-10-CM | POA: Diagnosis not present

## 2016-08-19 DIAGNOSIS — E7800 Pure hypercholesterolemia, unspecified: Secondary | ICD-10-CM | POA: Diagnosis not present

## 2016-08-19 DIAGNOSIS — E119 Type 2 diabetes mellitus without complications: Secondary | ICD-10-CM | POA: Diagnosis not present

## 2016-08-19 DIAGNOSIS — Z79899 Other long term (current) drug therapy: Secondary | ICD-10-CM | POA: Diagnosis not present

## 2016-08-26 DIAGNOSIS — Z23 Encounter for immunization: Secondary | ICD-10-CM | POA: Diagnosis not present

## 2016-08-26 DIAGNOSIS — Z79899 Other long term (current) drug therapy: Secondary | ICD-10-CM | POA: Diagnosis not present

## 2016-08-26 DIAGNOSIS — E7800 Pure hypercholesterolemia, unspecified: Secondary | ICD-10-CM | POA: Diagnosis not present

## 2016-08-26 DIAGNOSIS — Z0000 Encounter for general adult medical examination without abnormal findings: Secondary | ICD-10-CM | POA: Diagnosis not present

## 2016-08-26 DIAGNOSIS — E1122 Type 2 diabetes mellitus with diabetic chronic kidney disease: Secondary | ICD-10-CM | POA: Diagnosis not present

## 2016-08-26 DIAGNOSIS — N183 Chronic kidney disease, stage 3 (moderate): Secondary | ICD-10-CM | POA: Diagnosis not present

## 2016-10-08 DIAGNOSIS — Z85828 Personal history of other malignant neoplasm of skin: Secondary | ICD-10-CM | POA: Diagnosis not present

## 2016-10-08 DIAGNOSIS — D485 Neoplasm of uncertain behavior of skin: Secondary | ICD-10-CM | POA: Diagnosis not present

## 2016-10-08 DIAGNOSIS — L570 Actinic keratosis: Secondary | ICD-10-CM | POA: Diagnosis not present

## 2016-10-08 DIAGNOSIS — L820 Inflamed seborrheic keratosis: Secondary | ICD-10-CM | POA: Diagnosis not present

## 2016-10-08 DIAGNOSIS — L821 Other seborrheic keratosis: Secondary | ICD-10-CM | POA: Diagnosis not present

## 2016-10-08 DIAGNOSIS — C44622 Squamous cell carcinoma of skin of right upper limb, including shoulder: Secondary | ICD-10-CM | POA: Diagnosis not present

## 2016-10-25 DIAGNOSIS — K146 Glossodynia: Secondary | ICD-10-CM | POA: Diagnosis not present

## 2016-11-19 DIAGNOSIS — Z85828 Personal history of other malignant neoplasm of skin: Secondary | ICD-10-CM | POA: Diagnosis not present

## 2016-11-19 DIAGNOSIS — L821 Other seborrheic keratosis: Secondary | ICD-10-CM | POA: Diagnosis not present

## 2016-11-19 DIAGNOSIS — D0439 Carcinoma in situ of skin of other parts of face: Secondary | ICD-10-CM | POA: Diagnosis not present

## 2016-11-19 DIAGNOSIS — L570 Actinic keratosis: Secondary | ICD-10-CM | POA: Diagnosis not present

## 2016-11-19 DIAGNOSIS — D485 Neoplasm of uncertain behavior of skin: Secondary | ICD-10-CM | POA: Diagnosis not present

## 2016-11-22 DIAGNOSIS — K146 Glossodynia: Secondary | ICD-10-CM | POA: Diagnosis not present

## 2017-02-18 DIAGNOSIS — N183 Chronic kidney disease, stage 3 (moderate): Secondary | ICD-10-CM | POA: Diagnosis not present

## 2017-02-18 DIAGNOSIS — Z79899 Other long term (current) drug therapy: Secondary | ICD-10-CM | POA: Diagnosis not present

## 2017-02-18 DIAGNOSIS — E1122 Type 2 diabetes mellitus with diabetic chronic kidney disease: Secondary | ICD-10-CM | POA: Diagnosis not present

## 2017-02-18 DIAGNOSIS — E7800 Pure hypercholesterolemia, unspecified: Secondary | ICD-10-CM | POA: Diagnosis not present

## 2017-02-18 LAB — HEMOGLOBIN A1C: Hemoglobin A1C: 7.1

## 2017-02-18 LAB — HEPATIC FUNCTION PANEL
ALK PHOS: 49 U/L (ref 25–125)
ALT: 9 U/L — AB (ref 10–40)
AST: 18 U/L (ref 14–40)
Bilirubin, Total: 0.6 mg/dL

## 2017-02-18 LAB — LIPID PANEL
Cholesterol: 101 mg/dL (ref 0–200)
HDL: 47 mg/dL (ref 35–70)
LDL Cholesterol: 42 mg/dL
Triglycerides: 63 mg/dL (ref 40–160)

## 2017-02-18 LAB — BASIC METABOLIC PANEL
BUN: 31 mg/dL — AB (ref 4–21)
Creatinine: 1.3 mg/dL (ref <=1.3)
Glucose: 133 mg/dL
Potassium: 4.6 mmol/L (ref 3.4–5.3)
Sodium: 142 mmol/L (ref 137–147)

## 2017-02-18 LAB — CBC AND DIFFERENTIAL
HCT: 38 % — AB (ref 41–53)
HEMOGLOBIN: 13.2 g/dL — AB (ref 13.5–17.5)
Platelets: 142 10*3/uL — AB (ref 150–399)
WBC: 8.6 10*3/mL

## 2017-02-24 DIAGNOSIS — E7800 Pure hypercholesterolemia, unspecified: Secondary | ICD-10-CM | POA: Diagnosis not present

## 2017-02-24 DIAGNOSIS — E1122 Type 2 diabetes mellitus with diabetic chronic kidney disease: Secondary | ICD-10-CM | POA: Diagnosis not present

## 2017-02-24 DIAGNOSIS — I2510 Atherosclerotic heart disease of native coronary artery without angina pectoris: Secondary | ICD-10-CM | POA: Diagnosis not present

## 2017-02-24 DIAGNOSIS — N183 Chronic kidney disease, stage 3 (moderate): Secondary | ICD-10-CM | POA: Diagnosis not present

## 2017-02-24 DIAGNOSIS — I10 Essential (primary) hypertension: Secondary | ICD-10-CM | POA: Diagnosis not present

## 2017-02-24 DIAGNOSIS — Z79899 Other long term (current) drug therapy: Secondary | ICD-10-CM | POA: Diagnosis not present

## 2017-02-25 DIAGNOSIS — D485 Neoplasm of uncertain behavior of skin: Secondary | ICD-10-CM | POA: Diagnosis not present

## 2017-02-25 DIAGNOSIS — C4441 Basal cell carcinoma of skin of scalp and neck: Secondary | ICD-10-CM | POA: Diagnosis not present

## 2017-02-25 DIAGNOSIS — C44219 Basal cell carcinoma of skin of left ear and external auricular canal: Secondary | ICD-10-CM | POA: Diagnosis not present

## 2017-02-25 DIAGNOSIS — L570 Actinic keratosis: Secondary | ICD-10-CM | POA: Diagnosis not present

## 2017-02-25 DIAGNOSIS — Z85828 Personal history of other malignant neoplasm of skin: Secondary | ICD-10-CM | POA: Diagnosis not present

## 2017-03-03 ENCOUNTER — Other Ambulatory Visit: Payer: Self-pay | Admitting: Cardiology

## 2017-03-03 ENCOUNTER — Ambulatory Visit
Admission: RE | Admit: 2017-03-03 | Discharge: 2017-03-03 | Disposition: A | Discharge status: Home / Self Care | Payer: Medicare Other | Source: Ambulatory Visit | Attending: Cardiology | Admitting: Cardiology

## 2017-03-03 DIAGNOSIS — R0789 Other chest pain: Secondary | ICD-10-CM | POA: Diagnosis not present

## 2017-03-03 DIAGNOSIS — E119 Type 2 diabetes mellitus without complications: Secondary | ICD-10-CM | POA: Diagnosis not present

## 2017-03-03 DIAGNOSIS — R079 Chest pain, unspecified: Secondary | ICD-10-CM | POA: Diagnosis not present

## 2017-03-03 DIAGNOSIS — I2510 Atherosclerotic heart disease of native coronary artery without angina pectoris: Secondary | ICD-10-CM | POA: Diagnosis not present

## 2017-03-03 DIAGNOSIS — E785 Hyperlipidemia, unspecified: Secondary | ICD-10-CM | POA: Diagnosis not present

## 2017-03-03 DIAGNOSIS — I252 Old myocardial infarction: Secondary | ICD-10-CM | POA: Diagnosis not present

## 2017-03-06 ENCOUNTER — Other Ambulatory Visit: Payer: Self-pay | Admitting: Cardiology

## 2017-03-06 DIAGNOSIS — R079 Chest pain, unspecified: Secondary | ICD-10-CM

## 2017-03-07 ENCOUNTER — Ambulatory Visit
Admission: RE | Admit: 2017-03-07 | Discharge: 2017-03-07 | Disposition: A | Discharge status: Home / Self Care | Payer: Medicare Other | Source: Ambulatory Visit | Attending: Cardiology | Admitting: Cardiology

## 2017-03-07 DIAGNOSIS — R079 Chest pain, unspecified: Secondary | ICD-10-CM

## 2017-03-07 MED ORDER — IOPAMIDOL (ISOVUE-370) INJECTION 76%
80.0000 mL | Freq: Once | INTRAVENOUS | Status: AC | PRN
  Administered 2017-03-07: 80 mL via INTRAVENOUS

## 2017-03-14 DIAGNOSIS — N401 Enlarged prostate with lower urinary tract symptoms: Secondary | ICD-10-CM | POA: Diagnosis not present

## 2017-03-14 DIAGNOSIS — R351 Nocturia: Secondary | ICD-10-CM | POA: Diagnosis not present

## 2017-03-14 DIAGNOSIS — N5201 Erectile dysfunction due to arterial insufficiency: Secondary | ICD-10-CM | POA: Diagnosis not present

## 2017-03-14 DIAGNOSIS — N281 Cyst of kidney, acquired: Secondary | ICD-10-CM | POA: Diagnosis not present

## 2017-03-17 DIAGNOSIS — R079 Chest pain, unspecified: Secondary | ICD-10-CM | POA: Diagnosis not present

## 2017-03-18 DIAGNOSIS — R079 Chest pain, unspecified: Secondary | ICD-10-CM | POA: Diagnosis not present

## 2017-03-25 ENCOUNTER — Ambulatory Visit (INDEPENDENT_AMBULATORY_CARE_PROVIDER_SITE_OTHER): Payer: Medicare Other | Admitting: Adult Health

## 2017-03-25 ENCOUNTER — Encounter: Payer: Self-pay | Admitting: Adult Health

## 2017-03-25 ENCOUNTER — Other Ambulatory Visit: Payer: Self-pay | Admitting: Adult Health

## 2017-03-25 VITALS — BP 169/82 | HR 50 | Temp 97.4°F | Ht 73.0 in | Wt 168.2 lb

## 2017-03-25 DIAGNOSIS — R5383 Other fatigue: Secondary | ICD-10-CM | POA: Diagnosis not present

## 2017-03-25 DIAGNOSIS — R1011 Right upper quadrant pain: Secondary | ICD-10-CM | POA: Diagnosis not present

## 2017-03-25 DIAGNOSIS — N183 Chronic kidney disease, stage 3 unspecified: Secondary | ICD-10-CM

## 2017-03-25 DIAGNOSIS — I10 Essential (primary) hypertension: Secondary | ICD-10-CM | POA: Diagnosis not present

## 2017-03-25 DIAGNOSIS — I2510 Atherosclerotic heart disease of native coronary artery without angina pectoris: Secondary | ICD-10-CM | POA: Insufficient documentation

## 2017-03-25 DIAGNOSIS — R0683 Snoring: Secondary | ICD-10-CM

## 2017-03-25 DIAGNOSIS — Z23 Encounter for immunization: Secondary | ICD-10-CM

## 2017-03-25 DIAGNOSIS — E119 Type 2 diabetes mellitus without complications: Secondary | ICD-10-CM

## 2017-03-25 DIAGNOSIS — R109 Unspecified abdominal pain: Secondary | ICD-10-CM | POA: Insufficient documentation

## 2017-03-25 HISTORY — DX: Unspecified abdominal pain: R10.9

## 2017-03-25 HISTORY — DX: Atherosclerotic heart disease of native coronary artery without angina pectoris: I25.10

## 2017-03-25 HISTORY — DX: Snoring: R06.83

## 2017-03-25 MED ORDER — GLIMEPIRIDE 1 MG PO TABS: 1.0000 mg | ORAL_TABLET | Freq: Every day | ORAL | 0 refills | Status: DC

## 2017-03-25 NOTE — Assessment & Plan Note (Signed)
Loud snoring > 20 years. Sleep Study referral placed.

## 2017-03-25 NOTE — Assessment & Plan Note (Addendum)
RUQ pain developed one month ago, no obvious trauma. Pain increased with increased thoracic pressure (deep breathing, bending over). Had extensive cardiac work -up Apr 2018.   April 2018 CT Scan IMPRESSION: 1. No pulmonary embolus. 2. No acute or focal lesion to explain right-sided chest pain. 3. Coronary artery disease. 4. DISH Will monitor.

## 2017-03-25 NOTE — Assessment & Plan Note (Signed)
Continue HCTZ 25mg , Losartan 50mg , Toprol 50mg . Extensive cardiac work-up last month-negative for acute disorder. BP elevated today-169/82

## 2017-03-25 NOTE — Assessment & Plan Note (Signed)
1999 MI- one stent placed. 81 mg ASA and atorvastatin 10 daily Followed by Cards

## 2017-03-25 NOTE — Progress Notes (Addendum)
Subjective:    Patient ID: George Barnett, male    DOB: 1933-04-13, 83 y.o.   MRN: [id number]  HPI:  George Barnett presents to establish as a new pt. He is a pleasant 83 year old male.  PMH:  CAD,CKD, MI (1999 with one stent), OA, T2D, HL, RUQ abdominal pain, snoring, and fatigue  RUQ pain developed 1 month ago, denies acute trauma/injury to onset of pain.  Pain rated 8/10 that is constant and worsens when leaning forward and deep breathing/coughing.  He had full cardiac work-up end of April-all negative findings.  He denies eating affecting pain.  He denies this ever occurring before.   Wife reports that he snores significantly and has am fatigue (wife is retired Therapist, sports). Diabetes well controlled,  02/18/17 A1c - 7.1 Lipid panel tot 101, TG 63, HDL 47, LDL 42 Reports medication compliance, denies SE.  Patient Care Team    Relationship Specialty Notifications Start End  Esaw Grandchild, NP PCP - General Family Medicine  03/25/17     Patient Active Problem List   Diagnosis Date Noted  . CKD (chronic kidney disease) 04/22/2017  . Abdominal pain 03/25/2017  . Hypertension 03/25/2017  . CAD (coronary artery disease) 03/25/2017  . Diabetes mellitus without complication ([place]) 63/11/6008  . Snoring 03/25/2017  . Urinary retention due to benign prostatic hyperplasia 01/25/2015  . Primary osteoarthritis of right shoulder 01/24/2015     Past Medical History:  Diagnosis Date  . Cancer [facility])    SKIN CANCER   . Cataract   . Diabetes mellitus without complication ([place])   . Frequency of urination   . GERD (gastroesophageal reflux disease)   . Heart attack ([place])   . Heart disease   . Hypertension   . Primary osteoarthritis of right shoulder 01/24/2015  . Prostate enlargement   . Tremors of nervous system   . Urinary retention due to benign prostatic hyperplasia 01/25/2015     Past Surgical History:  Procedure Laterality Date  . CARDIAC CATHETERIZATION     1999  . cardio stint    . HERNIA  REPAIR     BILAT   . MOHS SURGERY     BILAT EARS   . PROSTATE BIOPSY    . TOTAL SHOULDER ARTHROPLASTY Right 01/24/2015   Procedure: RIGHT TOTAL SHOULDER ARTHROPLASTY;  Surgeon: Marchia Bond, MD;  Location: [facility];  Service: Orthopedics;  Laterality: Right;     Family History  Problem Relation Age of Onset  . Hypertension Brother      History  Drug Use No     History  Alcohol Use No     History  Smoking Status  . Never Smoker  Smokeless Tobacco  . Never Used     Outpatient Encounter Prescriptions as of 03/25/2017  Medication Sig  . aspirin 81 MG tablet Take 81 mg by mouth daily.  Marland Kitchen atorvastatin (LIPITOR) 10 MG tablet Take 10-15 mg by mouth daily. Mon Wed Fri 15 mg Sun Tu Th Sa 10 mg  . hydrochlorothiazide (HYDRODIURIL) 25 MG tablet Take 25 mg by mouth daily.  . metFORMIN (GLUCOPHAGE) 500 MG tablet Take 500-1,000 mg by mouth 2 (two) times daily with a meal. 1 tablet in the morning, 2 tablets in the evening  . metoprolol (LOPRESSOR) 50 MG tablet Take 25 mg by mouth 2 (two) times daily.   Marland Kitchen OVER THE COUNTER MEDICATION Folic Acid 932 mg tab 1 po daily  . OVER THE COUNTER MEDICATION Vitamin B 12 1000  mcg tab 1 po daily  . ranitidine (ZANTAC) 150 MG capsule Take 150 mg by mouth daily.  . [DISCONTINUED] glimepiride (AMARYL) 2 MG tablet Take 2 mg by mouth daily with breakfast.  . losartan (COZAAR) 50 MG tablet Take 25 mg by mouth daily.   Vladimir Faster Glycol-Propyl Glycol (SYSTANE OP) Apply 1 drop to eye 3 (three) times daily as needed (Dry eyes).  . Probiotic Product (ALIGN) 4 MG CAPS Take 1 capsule by mouth daily.  . [DISCONTINUED] baclofen (LIORESAL) 10 MG tablet Take 1 tablet (10 mg total) by mouth 3 (three) times daily. As needed for muscle spasm  . [DISCONTINUED] HYDROcodone-acetaminophen (NORCO) 10-325 MG per tablet Take 1-2 tablets by mouth every 6 (six) hours as needed.  . [DISCONTINUED] Multiple Vitamin (MULTIVITAMIN) tablet Take 1 tablet by mouth daily.  .  [DISCONTINUED] ondansetron (ZOFRAN ODT) 4 MG disintegrating tablet Take 1 tablet (4 mg total) by mouth every 8 (eight) hours as needed for nausea or vomiting. (Patient not taking: Reported on 01/10/2015)  . [DISCONTINUED] ondansetron (ZOFRAN) 4 MG tablet Take 1 tablet (4 mg total) by mouth every 8 (eight) hours as needed for nausea or vomiting.  . [DISCONTINUED] OVER THE COUNTER MEDICATION CoQ 10 100 mg capsule 1 po daily  . [DISCONTINUED] sennosides-docusate sodium (SENOKOT-S) 8.6-50 MG tablet Take 2 tablets by mouth daily.  . [DISCONTINUED] triamcinolone cream (KENALOG) 0.1 % Apply 1 application topically 2 (two) times daily. (Patient not taking: Reported on 01/10/2015)  . [DISCONTINUED] vitamin B-12 (CYANOCOBALAMIN) 1000 MCG tablet Take by mouth.   No facility-administered encounter medications on file as of 03/25/2017.     Allergies: Exenatide; Meperidine and related; Nadolol; and Sitagliptin phosphate [sitagliptin]  Body mass index is 22.19 kg/m.  Blood pressure (!) 169/82, pulse (!) 50, temperature 97.4 F (36.3 C), height 6\' 1"  (1.854 m), weight 168 lb 3.2 oz (76.3 kg), SpO2 97 %.    Review of Systems  Constitutional: Positive for fatigue. Negative for activity change, appetite change, chills, diaphoresis, fever and unexpected weight change.  Eyes: Negative for visual disturbance.  Respiratory: Negative for cough, chest tightness, shortness of breath and stridor.   Cardiovascular: Negative for chest pain, palpitations and leg swelling.  Gastrointestinal: Positive for abdominal pain. Negative for abdominal distention, blood in stool, constipation, diarrhea, nausea and vomiting.  Endocrine: Negative for cold intolerance, heat intolerance, polydipsia, polyphagia and polyuria.  Genitourinary: Negative for difficulty urinating, flank pain and hematuria.  Musculoskeletal: Positive for neck stiffness. Negative for arthralgias, back pain, gait problem, joint swelling and myalgias.  Skin:  Negative for color change, pallor, rash and wound.  Allergic/Immunologic: Negative for immunocompromised state.  Neurological: Positive for tremors. Negative for dizziness, speech difficulty, weakness, light-headedness and headaches.       Bil upper ext tremor that began > 10 years ago. Has been to neurologist, findings: "familial idiopathic tremor". Has difficulty writing and eating soup (R hand dominant).  Hematological: Does not bruise/bleed easily.  Psychiatric/Behavioral: Positive for sleep disturbance. Negative for agitation, behavioral problems, confusion, decreased concentration, dysphoric mood, hallucinations, self-injury and suicidal ideas. The patient is not nervous/anxious and is not hyperactive.        Objective:   Physical Exam  Constitutional: He is oriented to person, place, and time. He appears well-developed and well-nourished. No distress.  HENT:  Head: Normocephalic and atraumatic.  Right Ear: Tympanic membrane, external ear and ear canal normal. Decreased hearing is noted.  Left Ear: Tympanic membrane, external ear and ear canal normal. Decreased hearing is  noted.  Nose: Nose normal. Right sinus exhibits no maxillary sinus tenderness and no frontal sinus tenderness. Left sinus exhibits no maxillary sinus tenderness and no frontal sinus tenderness.  Mouth/Throat: Uvula is midline.  Eyes: Conjunctivae are normal. Pupils are equal, round, and reactive to light.  Neck: Normal range of motion. Neck supple.  Cardiovascular: Normal rate, regular rhythm, normal heart sounds and intact distal pulses.   No murmur heard. Pulmonary/Chest: Effort normal. No respiratory distress. He has no wheezes. He has no rales. He exhibits no tenderness.  Deep breathing increases RUQ pain.  Abdominal: Soft. Bowel sounds are normal. He exhibits no distension and no mass. There is no hepatosplenomegaly. There is no tenderness. There is no rebound, no guarding and no CVA tenderness. No hernia.     Abdominal pain begins at RUQ and radiates to mid chest. Palpation does not increase pain.  Musculoskeletal: Normal range of motion. He exhibits edema.       Right hand: He exhibits swelling.       Left hand: He exhibits swelling.  All fingers reddened, swollen.  Nails clubbed.  Lymphadenopathy:    He has no cervical adenopathy.  Neurological: He is alert and oriented to person, place, and time. He displays tremor. Coordination abnormal.  Tremor noted in upper extremities, R> L  Skin: Skin is warm and dry. No rash noted. He is not diaphoretic. There is erythema. No pallor. Nails show clubbing.  Erythema of all fingers.  Psychiatric: He has a normal mood and affect. His behavior is normal. Judgment and thought content normal.  Nursing note and vitals reviewed.         Assessment & Plan:   1. Snoring   2. Fatigue, unspecified type   3. Right upper quadrant abdominal pain   4. Essential hypertension   5. Coronary artery disease involving native heart without angina pectoris, unspecified vessel or lesion type   6. Diabetes mellitus without complication ([place])   7. Need for vaccination with 13-polyvalent pneumococcal conjugate vaccine   8. Stage 3 chronic kidney disease     Abdominal pain RUQ pain developed one month ago, no obvious trauma. Pain increased with increased thoracic pressure (deep breathing, bending over). Had extensive cardiac work -up Apr 2018.   April 2018 CT Scan IMPRESSION: 1. No pulmonary embolus. 2. No acute or focal lesion to explain right-sided chest pain. 3. Coronary artery disease. 4. DISH Will monitor.   Hypertension Continue HCTZ 25mg , Losartan 50mg , Toprol 50mg . Extensive cardiac work-up last month-negative for acute disorder. BP elevated today-169/82  CAD (coronary artery disease) 1999 MI- one stent placed. 81 mg ASA and atorvastatin 10 daily Followed by [facility]   Diabetes mellitus without complication ([place]) 0/12/6376 A1c- 7.1 Compliant  on meds, denies hypoglycemia.  Snoring Loud snoring > 20 years. Sleep Study referral placed.  CKD (chronic kidney disease) Stable On Toprol 50mg , Losartan 50mg , HCTZ 25mg     Ref Range & Units 103mo ago (02/18/17) 39yr ago (01/25/15) 61yr ago (01/12/15) 68yr ago (03/07/14)   Glucose mg/dL 133  182R   146R   94R    BUN 4 - 21 mg/dL 31   27R   33R   23R    Creatinine 0.6 - 1.3 mg/dL 1.3  1.36R   1.46R   1.23R    Potassium 3.4 - 5.3 mmol/L 4.6  4.4R  4.6R  4.1R    Sodium 137 - 147 mmol/L 142  134R   137R  140R  FOLLOW-UP:  Return in about 6 months (around 09/25/2017) for Regular Follow Up.

## 2017-03-25 NOTE — Patient Instructions (Signed)
Sleep Apnea Sleep apnea is a condition in which breathing pauses or becomes shallow during sleep. Episodes of sleep apnea usually last 10 seconds or longer, and they may occur as many as 20 times an hour. Sleep apnea disrupts your sleep and keeps your body from getting the rest that it needs. This condition can increase your risk of certain health problems, including:  Heart attack.  Stroke.  Obesity.  Diabetes.  Heart failure.  Irregular heartbeat. There are three kinds of sleep apnea:  Obstructive sleep apnea. This kind is caused by a blocked or collapsed airway.  Central sleep apnea. This kind happens when the part of the brain that controls breathing does not send the correct signals to the muscles that control breathing.  Mixed sleep apnea. This is a combination of obstructive and central sleep apnea. What are the causes? The most common cause of this condition is a collapsed or blocked airway. An airway can collapse or become blocked if:  Your throat muscles are abnormally relaxed.  Your tongue and tonsils are larger than normal.  You are overweight.  Your airway is smaller than normal. What increases the risk? This condition is more likely to develop in people who:  Are overweight.  Smoke.  Have a smaller than normal airway.  Are elderly.  Are male.  Drink alcohol.  Take sedatives or tranquilizers.  Have a family history of sleep apnea. What are the signs or symptoms? Symptoms of this condition include:  Trouble staying asleep.  Daytime sleepiness and tiredness.  Irritability.  Loud snoring.  Morning headaches.  Trouble concentrating.  Forgetfulness.  Decreased interest in sex.  Unexplained sleepiness.  Mood swings.  Personality changes.  Feelings of depression.  Waking up often during the night to urinate.  Dry mouth.  Sore throat. How is this diagnosed? This condition may be diagnosed with:  A medical history.  A physical  exam.  A series of tests that are done while you are sleeping (sleep study). These tests are usually done in a sleep lab, but they may also be done at home. How is this treated? Treatment for this condition aims to restore normal breathing and to ease symptoms during sleep. It may involve managing health issues that can affect breathing, such as high blood pressure or obesity. Treatment may include:  Sleeping on your side.  Using a decongestant if you have nasal congestion.  Avoiding the use of depressants, including alcohol, sedatives, and narcotics.  Losing weight if you are overweight.  Making changes to your diet.  Quitting smoking.  Using a device to open your airway while you sleep, such as:  An oral appliance. This is a custom-made mouthpiece that shifts your lower jaw forward.  A continuous positive airway pressure (CPAP) device. This device delivers oxygen to your airway through a mask.  A nasal expiratory positive airway pressure (EPAP) device. This device has valves that you put into each nostril.  A bi-level positive airway pressure (BPAP) device. This device delivers oxygen to your airway through a mask.  Surgery if other treatments do not work. During surgery, excess tissue is removed to create a wider airway. It is important to get treatment for sleep apnea. Without treatment, this condition can lead to:  High blood pressure.  Coronary artery disease.  (Men) An inability to achieve or maintain an erection (impotence).  Reduced thinking abilities. Follow these instructions at home:  Make any lifestyle changes that your health care provider recommends.  Eat a healthy, well-balanced   diet.  Take over-the-counter and prescription medicines only as told by your health care provider.  Avoid using depressants, including alcohol, sedatives, and narcotics.  Take steps to lose weight if you are overweight.  If you were given a device to open your airway while you  sleep, use it only as told by your health care provider.  Do not use any tobacco products, such as cigarettes, chewing tobacco, and e-cigarettes. If you need help quitting, ask your health care provider.  Keep all follow-up visits as told by your health care provider. This is important. Contact a health care provider if:  The device that you received to open your airway during sleep is uncomfortable or does not seem to be working.  Your symptoms do not improve.  Your symptoms get worse. Get help right away if:  You develop chest pain.  You develop shortness of breath.  You develop discomfort in your back, arms, or stomach.  You have trouble speaking.  You have weakness on one side of your body.  You have drooping in your face. These symptoms may represent a serious problem that is an emergency. Do not wait to see if the symptoms will go away. Get medical help right away. Call your local emergency services (911 in the [place] Do not drive yourself to the hospital. This information is not intended to replace advice given to you by your health care provider. Make sure you discuss any questions you have with your health care provider. Document Released: 10/25/2002 Document Revised: 06/30/2016 Document Reviewed: 08/14/2015 Elsevier Interactive Patient Education  2017 [employer] Inc[employer]   Abdominal Pain, Adult Abdominal pain can be caused by many things. Often, abdominal pain is not serious and it gets better with no treatment or by being treated at home. However, sometimes abdominal pain is serious. Your health care provider will do a medical history and a physical exam to try to determine the cause of your abdominal pain. Follow these instructions at home:  Take over-the-counter and prescription medicines only as told by your health care provider. Do not take a laxative unless told by your health care provider.  Drink enough fluid to keep your urine clear or pale yellow.  Watch your  condition for any changes.  Keep all follow-up visits as told by your health care provider. This is important. Contact a health care provider if:  Your abdominal pain changes or gets worse.  You are not hungry or you lose weight without trying.  You are constipated or have diarrhea for more than 2-3 days.  You have pain when you urinate or have a bowel movement.  Your abdominal pain wakes you up at night.  Your pain gets worse with meals, after eating, or with certain foods.  You are throwing up and cannot keep anything down.  You have a fever. Get help right away if:  Your pain does not go away as soon as your health care provider told you to expect.  You cannot stop throwing up.  Your pain is only in areas of the abdomen, such as the right side or the left lower portion of the abdomen.  You have bloody or black stools, or stools that look like tar.  You have severe pain, cramping, or bloating in your abdomen.  You have signs of dehydration, such as:  Dark urine, very little urine, or no urine.  Cracked lips.  Dry mouth.  Sunken eyes.  Sleepiness.  Weakness. This information is not intended to replace advice  given to you by your health care provider. Make sure you discuss any questions you have with your health care provider. Document Released: 08/14/2005 Document Revised: 05/24/2016 Document Reviewed: 04/17/2016 Elsevier Interactive Patient Education  2017 [employer].  Please continue all medications as directed. Sleep Study referral placed. If abdominal pain worsens please call clinic. Follow-up every 6 months, sooner if needed.

## 2017-03-25 NOTE — Assessment & Plan Note (Signed)
02/18/2017 A1c- 7.1 Compliant on meds, denies hypoglycemia.

## 2017-04-03 ENCOUNTER — Telehealth: Payer: Self-pay | Admitting: Adult Health

## 2017-04-03 ENCOUNTER — Other Ambulatory Visit: Payer: Self-pay

## 2017-04-03 NOTE — Telephone Encounter (Signed)
We have not prescribed these medications for the patient previously.  Please review and refill if appropriate.  T. Nelson, CMA  

## 2017-04-03 NOTE — Telephone Encounter (Signed)
Per Katy's last OV note per is followed by [facility] and will need to get CV meds from them- esp since recently seen and had "a cardiac w-up" per [facility] notes.

## 2017-04-03 NOTE — Telephone Encounter (Signed)
Pt came by with request for Rx refill for Metoprolol Tartrate 50 mg (taken 25 mg 2 xs daily. Please call into [facility] mail order. --glh

## 2017-04-03 NOTE — Telephone Encounter (Signed)
Already addressed and responded to via another method to [place] earlier today.

## 2017-04-04 NOTE — Telephone Encounter (Signed)
MyChart message sent to pt informing him of this information.  Charyl Bigger, CMA

## 2017-04-10 DIAGNOSIS — H524 Presbyopia: Secondary | ICD-10-CM | POA: Diagnosis not present

## 2017-04-10 DIAGNOSIS — H04123 Dry eye syndrome of bilateral lacrimal glands: Secondary | ICD-10-CM | POA: Diagnosis not present

## 2017-04-10 DIAGNOSIS — E119 Type 2 diabetes mellitus without complications: Secondary | ICD-10-CM | POA: Diagnosis not present

## 2017-04-10 DIAGNOSIS — H16103 Unspecified superficial keratitis, bilateral: Secondary | ICD-10-CM | POA: Diagnosis not present

## 2017-04-10 LAB — HM DIABETES EYE EXAM

## 2017-04-16 ENCOUNTER — Other Ambulatory Visit: Payer: Self-pay | Admitting: Gastroenterology

## 2017-04-16 DIAGNOSIS — K5900 Constipation, unspecified: Secondary | ICD-10-CM | POA: Diagnosis not present

## 2017-04-16 DIAGNOSIS — R109 Unspecified abdominal pain: Secondary | ICD-10-CM | POA: Diagnosis not present

## 2017-04-16 DIAGNOSIS — R1011 Right upper quadrant pain: Secondary | ICD-10-CM

## 2017-04-16 LAB — BASIC METABOLIC PANEL
BUN: 26 mg/dL — AB (ref 4–21)
CREATININE: 1.3 mg/dL (ref <=1.3)
Glucose: 151 mg/dL
Potassium: 4.4 mmol/L (ref 3.4–5.3)
Sodium: 140 mmol/L (ref 137–147)

## 2017-04-16 LAB — CBC AND DIFFERENTIAL
HCT: 40 % — AB (ref 41–53)
Hemoglobin: 13.6 g/dL (ref 13.5–17.5)
PLATELETS: 167 10*3/uL (ref 150–399)
WBC: 7.6 10*3/mL

## 2017-04-16 LAB — HEPATIC FUNCTION PANEL
ALT: 12 U/L (ref 10–40)
AST: 18 U/L (ref 14–40)
Alkaline Phosphatase: 49 U/L (ref 25–125)
Bilirubin, Total: 0.4 mg/dL

## 2017-04-22 ENCOUNTER — Ambulatory Visit
Admission: RE | Admit: 2017-04-22 | Discharge: 2017-04-22 | Disposition: A | Discharge status: Home / Self Care | Payer: Medicare Other | Source: Ambulatory Visit | Attending: Gastroenterology | Admitting: Gastroenterology

## 2017-04-22 DIAGNOSIS — R1011 Right upper quadrant pain: Secondary | ICD-10-CM

## 2017-04-22 DIAGNOSIS — N183 Chronic kidney disease, stage 3 unspecified: Secondary | ICD-10-CM | POA: Insufficient documentation

## 2017-04-22 HISTORY — DX: Chronic kidney disease, stage 3 unspecified: N18.30

## 2017-04-22 NOTE — Assessment & Plan Note (Signed)
Stable On Toprol 50mg , Losartan 50mg , HCTZ 25mg     Ref Range & Units 19mo ago (02/18/17) 42yr ago (01/25/15) 69yr ago (01/12/15) 39yr ago (03/07/14)   Glucose mg/dL 133  182R   146R   94R    BUN 4 - 21 mg/dL 31   27R   33R   23R    Creatinine 0.6 - 1.3 mg/dL 1.3  1.36R   1.46R   1.23R    Potassium 3.4 - 5.3 mmol/L 4.6  4.4R  4.6R  4.1R    Sodium 137 - 147 mmol/L 142  134R   137R  140R

## 2017-04-23 ENCOUNTER — Other Ambulatory Visit: Payer: Self-pay | Admitting: Gastroenterology

## 2017-04-23 DIAGNOSIS — R109 Unspecified abdominal pain: Secondary | ICD-10-CM

## 2017-04-23 DIAGNOSIS — K869 Disease of pancreas, unspecified: Secondary | ICD-10-CM

## 2017-04-24 ENCOUNTER — Ambulatory Visit
Admission: RE | Admit: 2017-04-24 | Discharge: 2017-04-24 | Disposition: A | Discharge status: Home / Self Care | Payer: Medicare Other | Source: Ambulatory Visit | Attending: Gastroenterology | Admitting: Gastroenterology

## 2017-04-24 DIAGNOSIS — R109 Unspecified abdominal pain: Secondary | ICD-10-CM

## 2017-04-24 DIAGNOSIS — K869 Disease of pancreas, unspecified: Secondary | ICD-10-CM

## 2017-04-24 MED ORDER — IOPAMIDOL (ISOVUE-300) INJECTION 61%
100.0000 mL | Freq: Once | INTRAVENOUS | Status: AC | PRN
  Administered 2017-04-24: 100 mL via INTRAVENOUS

## 2017-04-25 ENCOUNTER — Other Ambulatory Visit: Payer: Self-pay

## 2017-04-25 NOTE — Telephone Encounter (Signed)
We have not prescribed this medication for the patient previously.  Please review and refill if appropriate.  T. Deshayla Empson, CMA  

## 2017-04-26 ENCOUNTER — Encounter: Payer: Self-pay | Admitting: Adult Health

## 2017-04-28 MED ORDER — HYDROCHLOROTHIAZIDE 25 MG PO TABS: 25.0000 mg | ORAL_TABLET | Freq: Every day | ORAL | 2 refills | Status: DC

## 2017-05-02 DIAGNOSIS — R1010 Upper abdominal pain, unspecified: Secondary | ICD-10-CM | POA: Diagnosis not present

## 2017-05-02 DIAGNOSIS — R634 Abnormal weight loss: Secondary | ICD-10-CM | POA: Diagnosis not present

## 2017-05-05 ENCOUNTER — Encounter: Payer: Self-pay | Admitting: Family Medicine

## 2017-05-05 ENCOUNTER — Ambulatory Visit (INDEPENDENT_AMBULATORY_CARE_PROVIDER_SITE_OTHER): Payer: Medicare Other | Admitting: Family Medicine

## 2017-05-05 VITALS — BP 115/77 | HR 52 | Ht 72.0 in | Wt 165.4 lb

## 2017-05-05 DIAGNOSIS — K146 Glossodynia: Secondary | ICD-10-CM

## 2017-05-05 DIAGNOSIS — R1011 Right upper quadrant pain: Secondary | ICD-10-CM | POA: Diagnosis not present

## 2017-05-05 DIAGNOSIS — M940 Chondrocostal junction syndrome [Tietze]: Secondary | ICD-10-CM

## 2017-05-05 DIAGNOSIS — N183 Chronic kidney disease, stage 3 unspecified: Secondary | ICD-10-CM | POA: Insufficient documentation

## 2017-05-05 DIAGNOSIS — E1122 Type 2 diabetes mellitus with diabetic chronic kidney disease: Secondary | ICD-10-CM | POA: Insufficient documentation

## 2017-05-05 DIAGNOSIS — K219 Gastro-esophageal reflux disease without esophagitis: Secondary | ICD-10-CM | POA: Insufficient documentation

## 2017-05-05 DIAGNOSIS — I2510 Atherosclerotic heart disease of native coronary artery without angina pectoris: Secondary | ICD-10-CM | POA: Diagnosis not present

## 2017-05-05 HISTORY — DX: Chronic kidney disease, stage 3 unspecified: N18.30

## 2017-05-05 HISTORY — DX: Type 2 diabetes mellitus with diabetic chronic kidney disease: E11.22

## 2017-05-05 HISTORY — DX: Glossodynia: K14.6

## 2017-05-05 HISTORY — DX: Chondrocostal junction syndrome (tietze): M94.0

## 2017-05-05 NOTE — Patient Instructions (Addendum)
Information from [facility] Costochondritis: What You Need to Know     Am Fam Physician. 2009 Sep 15;75(10):1.   What is costochondritis? Costochondritis (koss-toe-con-DRY-tiss) is an inflammation in your rib cage where cartilage connects the ribs to the breastbone (sternum). It is also called chest wall pain or costosternal (koss-toe-STIR-null) syndrome. The main symptom is pain in the chest wall. The pain is often sharp, aching, or pressure-like. The pain gets worse with movement, deep breathing, or exercise. Pressing on the affected area of the rib can also cause pain.  What causes it? It usually has no known cause, but it can happen after a severe coughing episode or during physical activity that involves the upper body.  When should I see a doctor? Any patient with chest pain should see a doctor. Evaluation is especially important for persons older than 35 years and those with risk factors for heart disease, such as high cholesterol, high blood pressure, or diabetes.  How is it treated? Costochondritis is not life threatening and will not cause any other conditions. Medicine can help with the pain. These include acetaminophen (one brand: Tylenol), nonsteroidal anti-inflammatory drugs (such as aspirin or ibuprofen [one brand: Advil]), or other pain relievers, as appropriate. Use of a heating pad may also help. Consider decreasing any activities that make the pain worse.  How long does it take to get better? The pain can last from weeks to months, but it will heal on its own.  Where can I get more information? Your family doctor  American Academy of Family Physicians  Web site: https://familydoctor.org     Costochondritis Costochondritis is swelling and irritation (inflammation) of the tissue (cartilage) that connects your ribs to your breastbone (sternum). This causes pain in the front of your chest. The pain usually starts gradually and involves more than one rib. What  are the causes? The exact cause of this condition is not always known. It results from stress on the cartilage where your ribs attach to your sternum. The cause of this stress could be:  Chest injury (trauma).  Exercise or activity, such as lifting.  Severe coughing.  What increases the risk? You may be at higher risk for this condition if you:  Are male.  Are 42?81 years old.  Recently started a new exercise or work activity.  Have low levels of vitamin D.  Have a condition that makes you cough frequently.  What are the signs or symptoms? The main symptom of this condition is chest pain. The pain:  Usually starts gradually and can be sharp or dull.  Gets worse with deep breathing, coughing, or exercise.  Gets better with rest.  May be worse when you press on the sternum-rib connection (tenderness).  How is this diagnosed? This condition is diagnosed based on your symptoms, medical history, and a physical exam. Your health care provider will check for tenderness when pressing on your sternum. This is the most important finding. You may also have tests to rule out other causes of chest pain. These may include:  A chest X-ray to check for lung problems.  An electrocardiogram (ECG) to see if you have a heart problem that could be causing the pain.  An imaging scan to rule out a chest or rib fracture.  How is this treated? This condition usually goes away on its own over time. Your health care provider may prescribe an NSAID to reduce pain and inflammation. Your health care provider may also suggest that you:  Rest  and avoid activities that make pain worse.  Apply heat or cold to the area to reduce pain and inflammation.  Do exercises to stretch your chest muscles.  If these treatments do not help, your health care provider may inject a numbing medicine at the sternum-rib connection to help relieve the pain. Follow these instructions at home:  Avoid activities that  make pain worse. This includes any activities that use chest, abdominal, and side muscles.  If directed, put ice on the painful area: ? Put ice in a plastic bag. ? Place a towel between your skin and the bag. ? Leave the ice on for 20 minutes, 2-3 times a day.  If directed, apply heat to the affected area as often as told by your health care provider. Use the heat source that your health care provider recommends, such as a moist heat pack or a heating pad. ? Place a towel between your skin and the heat source. ? Leave the heat on for 20-30 minutes. ? Remove the heat if your skin turns bright red. This is especially important if you are unable to feel pain, heat, or cold. You may have a greater risk of getting burned.  Take over-the-counter and prescription medicines only as told by your health care provider.  Return to your normal activities as told by your health care provider. Ask your health care provider what activities are safe for you.  Keep all follow-up visits as told by your health care provider. This is important. Contact a health care provider if:  You have chills or a fever.  Your pain does not go away or it gets worse.  You have a cough that does not go away (is persistent). Get help right away if:  You have shortness of breath. This information is not intended to replace advice given to you by your health care provider. Make sure you discuss any questions you have with your health care provider. Document Released: 08/14/2005 Document Revised: 05/24/2016 Document Reviewed: 02/28/2016 Elsevier Interactive Patient Education  [employer].

## 2017-05-05 NOTE — Assessment & Plan Note (Signed)
I see no evidence of right upper quadrant abdominal pain today on history or physical

## 2017-05-05 NOTE — Progress Notes (Signed)
Pt here for an acute care OV today   Impression and Recommendations:    1. Costochondritis   2. Right upper quadrant abdominal pain-  now resolved      Costochondritis Extensive counseling done to patient and his wife in the office today.    Educational handouts provided  We discussed red flag symptoms which would be indicative of a cardiac etiology or gastrointestinal etiology for his pain- which he does not have.  - Counseled patient on pathophysiology of disease and discussed various treatment options, in addition to discussing the risks and benefits of various medications.  - Anticipatory guidance given.  Advised use of acetaminophen when necessary pain.     - Patient declined neuropathic pain modulating medications today and strongly desires to go for an interventional pain procedure to help alleviate his pain  - Encouraged to return to clinic or call the office with any further questions or concerns before scheduled follow-up office visit as needed.  Abdominal pain I see no evidence of right upper quadrant abdominal pain today on history or physical  Pt was in the office today for 40+ minutes, with over 50% time spent in face to face counseling of patients various medical conditions, treatment plans of those medical conditions including medicine management and lifestyle modification, strategies to improve health and well being; and in coordination of care. SEE ABOVE FOR DETAILS  The patient was counseled, risk factors were discussed, anticipatory guidance given.  Orders Placed This Encounter  Procedures  . Ambulatory referral to Pain Clinic    Gross side effects, risk and benefits, and alternatives of medications and treatment plan in general discussed with patient.  Patient is aware that all medications have potential side effects and we are unable to predict every side effect or drug-drug interaction that may occur.   Patient will call with any questions prior to  using medication if they have concerns.  Expresses verbal understanding and consents to current therapy and treatment regimen.  No barriers to understanding were identified.  Red flag symptoms and signs discussed in detail.  Patient expressed understanding regarding what to do in case of emergency\urgent symptoms  Please see AVS handed out to patient at the end of our visit for further patient instructions/ counseling done pertaining to today's office visit.   Return for  follow up with your PCP, in 2-3 mo, or as Ms George Barnett last indicated.     Note: This document was prepared using Dragon voice recognition software and may include unintentional dictation errors.  Mellody Dance 6:58 PM --------------------------------------------------------------------------------------------------------------------------------------------------------------------------------------------------------------------------------------------    Subjective:    CC:   HPI: George Barnett is a 83 y.o. male who presents to [place] [facility] today for issues as discussed below.   Patient is new to me today.  He is a patient of George Barnett's came in today to further discuss his chronic pain in his right anterior thorax region.  I'm the fifth provider he is seen for this condition in the past couple of months.  Including his former PCP Dr George Barnett, his current PCP George Barnett, his cardiologist, his gastroenterologist and then me.   He tells me today he wants another opinion.  **  Pain for 2 months- stays in same place.  Really no radiation now but used to radiate a little to the sternum.    -In R anterior inferior aspect chest wall/ costochondral jts,  -worse with mvmnt, cough, sneezing and deep inhalation.   -  Better with-nothing.  Patient does not like to take any medicines that he doesn't absolutely need to. -Pain is about 8-9/10 with mvmnt/ dep breaths- lasts 1-2 seconds- stabbing pain.    Intensive review of patient's chart was done today while in the room with him.  He had many questions about the overall workup of what was done for this pain thus far.   Saw prior PCP Dr George Barnett, current PCP George Books, NP, as well as cards for this Dr George Barnett and GI- Dr George Barnett.    Within the past couple of months Dr. Wynonia Barnett had obtained negative chest x-ray and a negative CT chest for PE.     Then saw Dr. Paulita Barnett ad had a ultrasound abdomen which showed hypoechogenic abnormality of the head of his pancreas.  A follow-up CT abdomen and pelvis was done April 24, 2017.  It showed stable cystic lesions of the head of the pancreas which were unchanged from [phone].   Also patient's most current lab results were reviewed and at the bottom  -->  Patient also recently saw a otolaryngologist for tongue pain, Dr. Philis Barnett at [facility] George Barnett in January 2018.    Wt Readings from Last 3 Encounters:  05/05/17 165 lb 6.4 oz (75 kg)  03/25/17 168 lb 3.2 oz (76.3 kg)  01/24/15 170 lb (77.1 kg)   BP Readings from Last 3 Encounters:  05/05/17 115/77  03/25/17 (!) 169/82  01/25/15 (!) 124/59   BMI Readings from Last 3 Encounters:  05/05/17 22.43 kg/m  03/25/17 22.19 kg/m  01/24/15 23.06 kg/m     Patient Care Team    Relationship Specialty Notifications Start End  George Grandchild, NP PCP - General Family Medicine  03/25/17      Patient Active Problem List   Diagnosis Date Noted  . GERD (gastroesophageal reflux disease) 05/05/2017  . Glossodynia 05/05/2017  . Costochondritis 05/05/2017  . Type 2 diabetes mellitus with stage 3 chronic kidney disease, without long-term current use of insulin ([place]) 05/05/2017  . CKD (chronic kidney disease) 04/22/2017  . Abdominal pain 03/25/2017  . Hypertension 03/25/2017  . CAD (coronary artery disease) 03/25/2017  . Diabetes mellitus without complication ([place]) 73/53/2992  . Snoring 03/25/2017  . Urinary retention due to benign  prostatic hyperplasia 01/25/2015  . Primary osteoarthritis of right shoulder 01/24/2015    Past Medical history, Surgical history, Family history, Social history, Allergies and Medications have been entered into the medical record, reviewed and changed as needed.    Current Meds  Medication Sig  . aspirin 81 MG tablet Take 81 mg by mouth daily.  Marland Kitchen atorvastatin (LIPITOR) 10 MG tablet Take 10-15 mg by mouth daily. Mon Wed Fri 15 mg Sun Tu Th Sa 10 mg  . glimepiride (AMARYL) 1 MG tablet Take 1 tablet (1 mg total) by mouth daily with breakfast.  . hydrochlorothiazide (HYDRODIURIL) 25 MG tablet Take 1 tablet (25 mg total) by mouth daily.  Marland Kitchen losartan (COZAAR) 50 MG tablet Take 25 mg by mouth daily.   . metFORMIN (GLUCOPHAGE) 500 MG tablet Take 500-1,000 mg by mouth 2 (two) times daily with a meal. 1 tablet in the morning, 2 tablets in the evening  . metoprolol (LOPRESSOR) 50 MG tablet Take 25 mg by mouth 2 (two) times daily.   Marland Kitchen OVER THE COUNTER MEDICATION Folic Acid 426 mg tab 1 po daily  . OVER THE COUNTER MEDICATION Vitamin B 12 1000 mcg tab 1 po daily  . Polyethyl Glycol-Propyl  Glycol (SYSTANE OP) Apply 1 drop to eye 3 (three) times daily as needed (Dry eyes).  . Probiotic Product (ALIGN) 4 MG CAPS Take 1 capsule by mouth daily.  . ranitidine (ZANTAC) 150 MG capsule Take 150 mg by mouth daily.    Allergies:  Allergies  Allergen Reactions  . Exenatide     nausea  . Meperidine And Related   . Nadolol     Numbness in fingers  . Sitagliptin Phosphate [Sitagliptin]     Weight loss      Review of Systems: General:   Denies fever, chills, unexplained weight loss.  Optho/Auditory:   Denies visual changes, blurred vision/LOV Respiratory:   Denies wheeze, DOE more than baseline levels.  Cardiovascular:   Denies chest pain, palpitations, new onset peripheral edema  Gastrointestinal:   Denies nausea, vomiting, diarrhea, abd pain.  Genitourinary: Denies dysuria, freq/ urgency, flank  pain or discharge from genitals.  Endocrine:     Denies hot or cold intolerance, polyuria, polydipsia. Musculoskeletal:   Denies unexplained myalgias, joint swelling, unexplained arthralgias, gait problems.  Skin:  Denies new onset rash, suspicious lesions Neurological:     Denies dizziness, unexplained weakness, numbness  Psychiatric/Behavioral:   Denies mood changes, suicidal or homicidal ideations, hallucinations    Objective:   Blood pressure 115/77, pulse (!) 52, height 6' (1.829 m), weight 165 lb 6.4 oz (75 kg). Body mass index is 22.43 kg/m. General:  Well Developed, well nourished, appropriate for stated age.  Neuro:  Alert and oriented * 3,  extra-ocular muscles intact  HEENT:  Normocephalic, atraumatic, neck supple Skin:  no gross rash, warm, pink. Cardiac:  RRR, S1 S2 Respiratory:  ECTA B/L and A/P, Not using accessory muscles, speaking in full sentences- unlabored. Cardio-Thoracic: Patient is point-tender over the costochondral joints of presumably the 8th and 9th ribs on R anteriorly, no swelling, ecchymosis, rash, erythema otherwise noted. Abdomen:  No guarding, rigidity or rebound, positive bowel sounds 4Q,  Scars present from past surgery, no organomegaly, no tenderness to deep palpation Vascular:  Ext warm, no cyanosis apprec.; cap RF less 2 sec. Psych:  No HI/SI, judgement and insight fair, Affect- anxious.  Recent Results (from the past 2160 hour(s))  CBC and differential     Status: Abnormal   Collection Time: 02/18/17 12:00 AM  Result Value Ref Range   Hemoglobin 13.2 (A) 13.5 - 17.5 g/dL   HCT 38 (A) 41 - 53 %   Platelets 142 (A) 150 - 399 K/L   WBC 8.6 63^8/GY  Basic metabolic panel     Status: Abnormal   Collection Time: 02/18/17 12:00 AM  Result Value Ref Range   Glucose 133 mg/dL   BUN 31 (A) 4 - 21 mg/dL   Creatinine 1.3 0.6 - 1.3 mg/dL   Potassium 4.6 3.4 - 5.3 mmol/L   Sodium 142 137 - 147 mmol/L  Lipid panel     Status: None   Collection Time:  02/18/17 12:00 AM  Result Value Ref Range   Triglycerides 63 40 - 160 mg/dL   Cholesterol 101 0 - 200 mg/dL   HDL 47 35 - 70 mg/dL   LDL Cholesterol 42 mg/dL  Hepatic function panel     Status: Abnormal   Collection Time: 02/18/17 12:00 AM  Result Value Ref Range   Alkaline Phosphatase 49 25 - 125 U/L   ALT 9 (A) 10 - 40 U/L   AST 18 14 - 40 U/L   Bilirubin, Total 0.6 mg/dL  Hemoglobin  A1c     Status: None   Collection Time: 02/18/17 12:00 AM  Result Value Ref Range   Hemoglobin A1C 7.1   HM DIABETES EYE EXAM     Status: None   Collection Time: 04/10/17 12:00 AM  Result Value Ref Range   HM Diabetic Eye Exam No Retinopathy No Retinopathy    Comment: [facility]  CBC and differential     Status: Abnormal   Collection Time: 04/16/17 12:00 AM  Result Value Ref Range   Hemoglobin 13.6 13.5 - 17.5 g/dL   HCT 40 (A) 41 - 53 %   Platelets 167 150 - 399 K/L   WBC 7.6 12^8/NO  Basic metabolic panel     Status: Abnormal   Collection Time: 04/16/17 12:00 AM  Result Value Ref Range   Glucose 151 mg/dL   BUN 26 (A) 4 - 21 mg/dL   Creatinine 1.3 0.6 - 1.3 mg/dL   Potassium 4.4 3.4 - 5.3 mmol/L   Sodium 140 137 - 147 mmol/L  Hepatic function panel     Status: None   Collection Time: 04/16/17 12:00 AM  Result Value Ref Range   Alkaline Phosphatase 49 25 - 125 U/L   ALT 12 10 - 40 U/L   AST 18 14 - 40 U/L   Bilirubin, Total 0.4 mg/dL

## 2017-05-05 NOTE — Assessment & Plan Note (Addendum)
Extensive counseling done to patient and his wife in the office today.    Educational handouts provided  We discussed red flag symptoms which would be indicative of a cardiac etiology or gastrointestinal etiology for his pain- which he does not have.  - Counseled patient on pathophysiology of disease and discussed various treatment options, in addition to discussing the risks and benefits of various medications.  - Anticipatory guidance given.  Advised use of acetaminophen when necessary pain.     - Patient declined neuropathic pain modulating medications today and strongly desires to go for an interventional pain procedure to help alleviate his pain  - Encouraged to return to clinic or call the office with any further questions or concerns before scheduled follow-up office visit as needed.

## 2017-05-09 DIAGNOSIS — E119 Type 2 diabetes mellitus without complications: Secondary | ICD-10-CM | POA: Diagnosis not present

## 2017-05-09 DIAGNOSIS — I252 Old myocardial infarction: Secondary | ICD-10-CM | POA: Diagnosis not present

## 2017-05-09 DIAGNOSIS — I2510 Atherosclerotic heart disease of native coronary artery without angina pectoris: Secondary | ICD-10-CM | POA: Diagnosis not present

## 2017-05-09 DIAGNOSIS — R079 Chest pain, unspecified: Secondary | ICD-10-CM | POA: Diagnosis not present

## 2017-05-09 DIAGNOSIS — E785 Hyperlipidemia, unspecified: Secondary | ICD-10-CM | POA: Diagnosis not present

## 2017-05-09 DIAGNOSIS — R0789 Other chest pain: Secondary | ICD-10-CM | POA: Diagnosis not present

## 2017-05-16 ENCOUNTER — Encounter: Payer: Self-pay | Admitting: Physical Medicine & Rehabilitation

## 2017-05-26 DIAGNOSIS — L570 Actinic keratosis: Secondary | ICD-10-CM | POA: Diagnosis not present

## 2017-05-26 DIAGNOSIS — L218 Other seborrheic dermatitis: Secondary | ICD-10-CM | POA: Diagnosis not present

## 2017-05-26 DIAGNOSIS — Z85828 Personal history of other malignant neoplasm of skin: Secondary | ICD-10-CM | POA: Diagnosis not present

## 2017-05-29 ENCOUNTER — Ambulatory Visit (HOSPITAL_BASED_OUTPATIENT_CLINIC_OR_DEPARTMENT_OTHER): Payer: Medicare Other | Attending: Adult Health | Admitting: Internal Medicine

## 2017-05-29 DIAGNOSIS — H26491 Other secondary cataract, right eye: Secondary | ICD-10-CM | POA: Diagnosis not present

## 2017-05-29 DIAGNOSIS — R0683 Snoring: Secondary | ICD-10-CM | POA: Insufficient documentation

## 2017-05-29 DIAGNOSIS — R5383 Other fatigue: Secondary | ICD-10-CM | POA: Insufficient documentation

## 2017-05-29 DIAGNOSIS — G4761 Periodic limb movement disorder: Secondary | ICD-10-CM | POA: Diagnosis not present

## 2017-05-31 DIAGNOSIS — R0683 Snoring: Secondary | ICD-10-CM

## 2017-05-31 NOTE — Procedures (Signed)
  Patient Name: George, Barnett Date: 05/29/2017 Gender: Male D.O.B: [birth year] Age (years): 51 Referring Provider: Odella Aquas NP Height (inches): 71 Interpreting Physician: Baird Lyons MD, ABSM Weight (lbs): 165 RPSGT: Baxter Flattery BMI: 23 MRN: [id number] Neck Size: 14.00 CLINICAL INFORMATION Sleep Study Type: NPSG   Indication for sleep study: Diabetes, Fatigue, Snoring, Witnesses Apnea / Gasping During Sleep  Epworth Sleepiness Score: 4/24  SLEEP STUDY TECHNIQUE As per the AASM Manual for the Scoring of Sleep and Associated Events v2.3 (April 2016) with a hypopnea requiring 4% desaturations.  The channels recorded and monitored were frontal, central and occipital EEG, electrooculogram (EOG), submentalis EMG (chin), nasal and oral airflow, thoracic and abdominal wall motion, anterior tibialis EMG, snore microphone, electrocardiogram, and pulse oximetry.  MEDICATIONS Medications self-administered by patient taken the night of the study : none reported  SLEEP ARCHITECTURE The study was initiated at 10:05:18 PM and ended at 4:58:25 AM.  Sleep onset time was 43.3 minutes and the sleep efficiency was 58.9%. The total sleep time was 243.5 minutes.  Stage REM latency was 191.0 minutes.  The patient spent 4.93% of the night in stage N1 sleep, 83.16% in stage N2 sleep, 0.00% in stage N3 and 11.91% in REM.  Alpha intrusion was absent.  Supine sleep was 44.04%.  RESPIRATORY PARAMETERS The overall apnea/hypopnea index (AHI) was 3.7 per hour. There were 15 total apneas, including 15 obstructive, 0 central and 0 mixed apneas. There were 0 hypopneas and 0 RERAs.  The AHI during Stage REM sleep was 0.0 per hour.  AHI while supine was 3.9 per hour.  The mean oxygen saturation was 94.93%. The minimum SpO2 during sleep was 91.00%.  Moderate snoring was noted during this study.  CARDIAC DATA The 2 lead EKG demonstrated sinus rhythm. The mean heart rate was 50.81 beats  per minute. Other EKG findings include: PVCs.  LEG MOVEMENT DATA The total PLMS were 427 with a resulting PLMS index of 105.22. Associated arousal with leg movement index was 1.2 .  IMPRESSIONS - No significant obstructive sleep apnea occurred during this study (AHI = 3.7/h). - No significant central sleep apnea occurred during this study (CAI = 0.0/h). - Insignificant oxygen desaturation was noted during this study (Min O2 = 91.00%). - The patient snored with Moderate snoring volume. - EKG findings include PVCs. - Severe periodic limb movements of sleep occurred during the study.Few associated arousals. - Difficulty initiating and maintaining sleep.  DIAGNOSIS - Periodic Limb Movement Syndrome (327.51 [G47.61 ICD-10]) - Primary Snoring (786.09 [R06.83 ICD-10])  RECOMMENDATIONS - Consider trial of specific therapy for limb movement disturbance, such as Requip or Mirapex. - Sleep hygiene should be reviewed to assess factors that may improve sleep quality. - Weight management and regular exercise should be initiated or continued if appropriate.  [Electronically signed] 05/31/2017 04:01 PM  Baird Lyons MD, [place], American Board of Sleep Medicine   NPI: [id number] [place], [place] of Sleep Medicine  ELECTRONICALLY SIGNED ON:  05/31/2017, 3:55 PM [place] PH: [phone]   FX: [phone] [place]

## 2017-06-04 ENCOUNTER — Other Ambulatory Visit: Payer: Self-pay | Admitting: Adult Health

## 2017-06-04 ENCOUNTER — Telehealth: Payer: Self-pay | Admitting: Adult Health

## 2017-06-04 ENCOUNTER — Telehealth: Payer: Self-pay

## 2017-06-04 NOTE — Telephone Encounter (Signed)
Patient called back during lunch to return Shannon's call. He said that you can call him at home when you become available.

## 2017-06-04 NOTE — Telephone Encounter (Signed)
Left message for patient to call office regarding sleep study and recommendations.

## 2017-06-04 NOTE — Telephone Encounter (Signed)
Spoke with patient regarding sleep study results.  Appointment made for patient to discuss results and recommendations.

## 2017-06-04 NOTE — Telephone Encounter (Signed)
-----   Message from Esaw Grandchild, NP sent at 06/04/2017  8:25 AM EDT ----- Good Morning, Can you please call Mr. Blancett and share that his Sleep Study did not reveal OSA.  However significant limb movements did occur during the study with arousals-which is producing poor sleep. Please have him make an appt to discuss medication to help reduce these sx's. Thanks! Valetta Fuller

## 2017-06-05 DIAGNOSIS — M25522 Pain in left elbow: Secondary | ICD-10-CM | POA: Diagnosis not present

## 2017-06-05 DIAGNOSIS — M71122 Other infective bursitis, left elbow: Secondary | ICD-10-CM | POA: Diagnosis not present

## 2017-06-12 DIAGNOSIS — H26492 Other secondary cataract, left eye: Secondary | ICD-10-CM | POA: Diagnosis not present

## 2017-06-13 ENCOUNTER — Encounter: Payer: Self-pay | Admitting: Physical Medicine & Rehabilitation

## 2017-06-13 ENCOUNTER — Encounter: Payer: Medicare Other | Attending: Physical Medicine & Rehabilitation | Admitting: Physical Medicine & Rehabilitation

## 2017-06-13 VITALS — BP 143/60 | HR 57

## 2017-06-13 DIAGNOSIS — E119 Type 2 diabetes mellitus without complications: Secondary | ICD-10-CM | POA: Insufficient documentation

## 2017-06-13 DIAGNOSIS — I252 Old myocardial infarction: Secondary | ICD-10-CM | POA: Insufficient documentation

## 2017-06-13 DIAGNOSIS — K219 Gastro-esophageal reflux disease without esophagitis: Secondary | ICD-10-CM | POA: Diagnosis not present

## 2017-06-13 DIAGNOSIS — I2510 Atherosclerotic heart disease of native coronary artery without angina pectoris: Secondary | ICD-10-CM | POA: Diagnosis not present

## 2017-06-13 DIAGNOSIS — R109 Unspecified abdominal pain: Secondary | ICD-10-CM | POA: Diagnosis not present

## 2017-06-13 DIAGNOSIS — R0781 Pleurodynia: Secondary | ICD-10-CM | POA: Diagnosis not present

## 2017-06-13 DIAGNOSIS — I10 Essential (primary) hypertension: Secondary | ICD-10-CM | POA: Diagnosis not present

## 2017-06-13 DIAGNOSIS — M19011 Primary osteoarthritis, right shoulder: Secondary | ICD-10-CM | POA: Insufficient documentation

## 2017-06-13 DIAGNOSIS — M940 Chondrocostal junction syndrome [Tietze]: Secondary | ICD-10-CM | POA: Diagnosis not present

## 2017-06-13 MED ORDER — DICLOFENAC SODIUM 1 % TD GEL: 2.0000 g | Freq: Four times a day (QID) | TRANSDERMAL | 1 refills | Status: DC

## 2017-06-13 NOTE — Progress Notes (Signed)
Subjective:    Patient ID: George Barnett, male    DOB: 1933-07-13, 83 y.o.   MRN: [id number]  HPI  83 y/o male with pmh of right shoulder OA, HTN, GERD, DM, right total shoulder arthroplasty2016 present for right rib pain.  Started 02/2017.  Denies inciting event.  Stable.  Rest improves the pain. Deep inhalation, cough, bending exacerbates the pain. Sharp.  Non-radiating.  Intermittent with positional changes.  Denies associated numbness/tingling.  He saw GI and Cards and was cleared. Ibu without benefit.  Denies falls.  Pain limits certain movement and activities.   Pain Inventory Average Pain 8 Pain Right Now 0 My pain is sharp and stabbing  In the last 24 hours, has pain interfered with the following? General activity 1 Relation with others 0 Enjoyment of life 4 What TIME of day is your pain at its worst? daytime Sleep (in general) Good  Pain is worse with: bending, some activites and with certain types of movement Pain improves with: rest Relief from Meds: 0  Mobility walk without assistance ability to climb steps?  yes do you drive?  yes  Function retired  Neuro/Psych No problems in this area  Prior Studies Any changes since last visit?  no  Physicians involved in your care Primary care Lina Sayre GI MD, Wynonia Lawman MD Cardiology, [place] [facility]   Family History  Problem Relation Age of Onset  . Hypertension Brother    Social History   Social History  . Marital status: Married    Spouse name: N/A  . Number of children: N/A  . Years of education: N/A   Social History Main Topics  . Smoking status: Never Smoker  . Smokeless tobacco: Never Used  . Alcohol use No  . Drug use: No  . Sexual activity: Not Asked   Other Topics Concern  . None   Social History Narrative  . None   Past Surgical History:  Procedure Laterality Date  . CARDIAC CATHETERIZATION     1999  . cardio stint    . HERNIA REPAIR     BILAT   . MOHS SURGERY     BILAT EARS   . PROSTATE BIOPSY    . TOTAL SHOULDER ARTHROPLASTY Right 01/24/2015   Procedure: RIGHT TOTAL SHOULDER ARTHROPLASTY;  Surgeon: Marchia Bond, MD;  Location: [place];  Service: Orthopedics;  Laterality: Right;   Past Medical History:  Diagnosis Date  . Cancer [facility])    SKIN CANCER   . Cataract   . Diabetes mellitus without complication ([place])   . Frequency of urination   . GERD (gastroesophageal reflux disease)   . Heart attack ([place])   . Heart disease   . Hypertension   . Primary osteoarthritis of right shoulder 01/24/2015  . Prostate enlargement   . Tremors of nervous system   . Urinary retention due to benign prostatic hyperplasia 01/25/2015   BP (!) 143/60   Pulse (!) 57   SpO2 98%   Opioid Risk Score:   Fall Risk Score:  `1  Depression screen PHQ 2/9  Depression screen [facility] 2/9 06/13/2017 05/05/2017 03/25/2017  Decreased Interest 0 0 0  Down, Depressed, Hopeless 0 0 0  PHQ - 2 Score 0 0 0  Altered sleeping 1 - -  Tired, decreased energy 0 - -  Change in appetite 2 - -  Feeling bad or failure about yourself  0 - -  Trouble concentrating 0 - -  Moving slowly or fidgety/restless 0 - -  Suicidal thoughts 0 - -  PHQ-9 Score 3 - -    Review of Systems  Constitutional: Positive for appetite change.       Poor  HENT: Negative.   Eyes: Negative.   Respiratory: Negative.   Cardiovascular: Positive for leg swelling.  Gastrointestinal: Positive for constipation.  Endocrine: Negative.   Genitourinary: Negative.   Musculoskeletal:       Rib pain  Skin: Negative.   Allergic/Immunologic: Negative.   Neurological: Negative.   Hematological: Negative.   Psychiatric/Behavioral: Negative.   All other systems reviewed and are negative.      Objective:   Physical Exam Gen: NAD. Vital signs reviewed HENT: Normocephalic, Atraumatic Eyes: EOMI. No discharge.  Cardio: RRR. No JVD. Pulm: B/l clear to auscultation.  Effort normal Abd: Soft, BS+. Birth deformity MSK:  Gait  WNL.   No TTP.   Neuro:  Sensation intact to light touch in all UE dermatomes  Strength  4+/5 in all UE myotomes  SLR neg Skin: Warm and Dry. Intact    Assessment & Plan:  83 y/o male with pmh of right shoulder OA, HTN, GERD, DM, right total shoulder arthroplasty2016 present for right rib pain.   1. Chronic right costochondral rib pain  Films reviewed, neg for PE  Labs reviewed  Referral information reviewed  Cont Heat/Cold  Will order Voltaren gel  Will consider Lidoderm patch  Will consider Cymbalta  Will consider TENS unit  Will consider PT in future  Will consider injection in future

## 2017-06-17 ENCOUNTER — Encounter: Payer: Self-pay | Admitting: Adult Health

## 2017-06-17 ENCOUNTER — Ambulatory Visit (INDEPENDENT_AMBULATORY_CARE_PROVIDER_SITE_OTHER): Payer: Medicare Other | Admitting: Adult Health

## 2017-06-17 DIAGNOSIS — N183 Chronic kidney disease, stage 3 unspecified: Secondary | ICD-10-CM

## 2017-06-17 DIAGNOSIS — I2510 Atherosclerotic heart disease of native coronary artery without angina pectoris: Secondary | ICD-10-CM | POA: Diagnosis not present

## 2017-06-17 DIAGNOSIS — I10 Essential (primary) hypertension: Secondary | ICD-10-CM

## 2017-06-17 DIAGNOSIS — G2581 Restless legs syndrome: Secondary | ICD-10-CM

## 2017-06-17 DIAGNOSIS — E119 Type 2 diabetes mellitus without complications: Secondary | ICD-10-CM | POA: Diagnosis not present

## 2017-06-17 HISTORY — DX: Restless legs syndrome: G25.81

## 2017-06-17 MED ORDER — ROPINIROLE HCL 0.25 MG PO TABS: 0.2500 mg | ORAL_TABLET | Freq: Every day | ORAL | 0 refills | Status: DC

## 2017-06-17 NOTE — Assessment & Plan Note (Signed)
A1c 03/13/17 7.1 Currently taking Metformin 500mg  BID and glimepiride 1mg  each am. He denies episodes of hypoglycemia. Will re-check A1c 08/18

## 2017-06-17 NOTE — Patient Instructions (Signed)
Restless Legs Syndrome Restless legs syndrome is a condition that causes uncomfortable feelings or sensations in the legs, especially while sitting or lying down. The sensations usually cause an overwhelming urge to move the legs. The arms can also sometimes be affected. The condition can range from mild to severe. The symptoms often interfere with a person's ability to sleep. What are the causes? The cause of this condition is not known. What increases the risk? This condition is more likely to develop in:  People who are older than age 51.  Pregnant women. In general, restless legs syndrome is more common in women than in men.  People who have a family history of the condition.  People who have certain medical conditions, such as iron deficiency, kidney disease, Parkinson disease, or nerve damage.  People who take certain medicines, such as medicines for high blood pressure, nausea, colds, allergies, depression, and some heart conditions.  What are the signs or symptoms? The main symptom of this condition is uncomfortable sensations in the legs. These sensations may be:  Described as pulling, tingling, prickling, throbbing, crawling, or burning.  Worse while you are sitting or lying down.  Worse during periods of rest or inactivity.  Worse at night, often interfering with your sleep.  Accompanied by a very strong urge to move your legs.  Temporarily relieved by movement of your legs.  The sensations usually affect both sides of the body. The arms can also be affected, but this is rare. People who have this condition often have tiredness during the day because of their lack of sleep at night. How is this diagnosed? This condition may be diagnosed based on your description of the symptoms. You may also have tests, including blood tests, to check for other conditions that may lead to your symptoms. In some cases, you may be asked to spend some time in a sleep lab so your sleeping  can be monitored. How is this treated? Treatment for this condition is focused on managing the symptoms. Treatment may include:  Self-help and lifestyle changes.  Medicines.  Follow these instructions at home:  Take medicines only as directed by your health care provider.  Try these methods to get temporary relief from the uncomfortable sensations: ? Massage your legs. ? Walk or stretch. ? Take a cold or hot bath.  Practice good sleep habits. For example, go to bed and get up at the same time every day.  Exercise regularly.  Practice ways of relaxing, such as yoga or meditation.  Avoid caffeine and alcohol.  Do not use any tobacco products, including cigarettes, chewing tobacco, or electronic cigarettes. If you need help quitting, ask your health care provider.  Keep all follow-up visits as directed by your health care provider. This is important. Contact a health care provider if: Your symptoms do not improve with treatment, or they get worse. This information is not intended to replace advice given to you by your health care provider. Make sure you discuss any questions you have with your health care provider. Document Released: 10/25/2002 Document Revised: 04/11/2016 Document Reviewed: 10/31/2014 Elsevier Interactive Patient Education  2018 [employer].  Increase water intake. Please start Requip 0.25mg  at bedtime. Follow-up in 4 weeks.

## 2017-06-17 NOTE — Assessment & Plan Note (Addendum)
Reviewed Sleep Study results- No sig OSA, no sig central sleep apnea during study. Severe periodic limb movements during the study.  Increase water intake Start Ropinirole 0.25mg  [facility] Practice Sleep Hygiene F/u in 4 weeks to evaluate medication effectiveness.

## 2017-06-17 NOTE — Progress Notes (Signed)
Subjective:    Patient ID: George Barnett, male    DOB: 12-25-1932, 83 y.o.   MRN: [id number]  HPI:  George Barnett presents to review Sleep Study Results- Reviewed Sleep Study results- No sig OSA, no sig central sleep apnea during study. Severe periodic limb movements during the study. He continues to feel fatigued in mornings.  He denies CP/dyspnea/HA/dizziness/palpitations.   Wife at [facility] during [place].  Patient Care Team    Relationship Specialty Notifications Start End  Esaw Grandchild, NP PCP - General Family Medicine  03/25/17     Patient Active Problem List   Diagnosis Date Noted  . RLS (restless legs syndrome) 06/17/2017  . GERD (gastroesophageal reflux disease) 05/05/2017  . Glossodynia 05/05/2017  . Costochondritis 05/05/2017  . Type 2 diabetes mellitus with stage 3 chronic kidney disease, without long-term current use of insulin ([place]) 05/05/2017  . CKD (chronic kidney disease) 04/22/2017  . Abdominal pain 03/25/2017  . Hypertension 03/25/2017  . CAD (coronary artery disease) 03/25/2017  . Diabetes mellitus without complication ([place]) 62/83/1517  . Snoring 03/25/2017  . Urinary retention due to benign prostatic hyperplasia 01/25/2015  . Primary osteoarthritis of right shoulder 01/24/2015     Past Medical History:  Diagnosis Date  . Cancer [facility])    SKIN CANCER   . Cataract   . Diabetes mellitus without complication ([place])   . Elbow effusion, left   . Frequency of urination   . GERD (gastroesophageal reflux disease)   . Heart attack ([place])   . Heart disease   . Hypertension   . Primary osteoarthritis of right shoulder 01/24/2015  . Prostate enlargement   . Tremors of nervous system   . Urinary retention due to benign prostatic hyperplasia 01/25/2015     Past Surgical History:  Procedure Laterality Date  . CARDIAC CATHETERIZATION     1999  . cardio stint    . HERNIA REPAIR     BILAT   . MOHS SURGERY     BILAT EARS   . PROSTATE BIOPSY    . TOTAL SHOULDER  ARTHROPLASTY Right 01/24/2015   Procedure: RIGHT TOTAL SHOULDER ARTHROPLASTY;  Surgeon: Marchia Bond, MD;  Location: [place];  Service: Orthopedics;  Laterality: Right;     Family History  Problem Relation Age of Onset  . Hypertension Brother      History  Drug Use No     History  Alcohol Use No     History  Smoking Status  . Never Smoker  Smokeless Tobacco  . Never Used     Outpatient Encounter Prescriptions as of 06/17/2017  Medication Sig  . aspirin 81 MG tablet Take 81 mg by mouth daily.  Marland Kitchen atorvastatin (LIPITOR) 10 MG tablet Take 10-15 mg by mouth daily. Mon Wed Fri 15 mg Sun Tu Th Sa 10 mg  . glimepiride (AMARYL) 1 MG tablet Take 1 tablet (1 mg total) by mouth daily with breakfast.  . hydrochlorothiazide (HYDRODIURIL) 25 MG tablet Take 1 tablet (25 mg total) by mouth daily.  Marland Kitchen losartan (COZAAR) 50 MG tablet Take 25 mg by mouth daily.   . metFORMIN (GLUCOPHAGE) 500 MG tablet Take 500-1,000 mg by mouth 2 (two) times daily with a meal. 1 tablet in the morning, 2 tablets in the evening  . metoprolol (LOPRESSOR) 50 MG tablet Take 25 mg by mouth 2 (two) times daily.   Marland Kitchen OVER THE COUNTER MEDICATION Folic Acid 616 mg tab 1 po daily  . OVER THE COUNTER MEDICATION Vitamin  B 12 1000 mcg tab 1 po daily  . Polyethyl Glycol-Propyl Glycol (SYSTANE OP) Apply 1 drop to eye 3 (three) times daily as needed (Dry eyes).  . sucralfate (CARAFATE) 1 g tablet Take 1 tablet by mouth 2 (two) times daily.  . diclofenac sodium (VOLTAREN) 1 % GEL Apply 2 g topically 4 (four) times daily. (Patient not taking: Reported on 06/17/2017)  . rOPINIRole (REQUIP) 0.25 MG tablet Take 1 tablet (0.25 mg total) by mouth at bedtime.  . [DISCONTINUED] ranitidine (ZANTAC) 150 MG capsule Take 150 mg by mouth daily.   No facility-administered encounter medications on file as of 06/17/2017.     Allergies: Exenatide; Meperidine and related; Nadolol; and Sitagliptin phosphate [sitagliptin]  Body mass index is  21.84 kg/m.  Blood pressure 132/71, pulse (!) 54, height 6' (1.829 m), weight 161 lb (73 kg).      Review of Systems  Constitutional: Positive for fatigue. Negative for activity change, appetite change, chills, diaphoresis, fever and unexpected weight change.  Eyes: Negative for visual disturbance.  Respiratory: Negative for cough, chest tightness, shortness of breath and stridor.   Cardiovascular: Negative for chest pain, palpitations and leg swelling.  Gastrointestinal: Negative for abdominal distention, abdominal pain, blood in stool, constipation, diarrhea, nausea and vomiting.  Endocrine: Negative for cold intolerance, heat intolerance, polydipsia, polyphagia and polyuria.  Genitourinary: Negative for difficulty urinating.  Musculoskeletal: Positive for arthralgias, back pain, joint swelling and myalgias. Negative for gait problem.  Neurological: Negative for dizziness, tremors, weakness and headaches.  Hematological: Does not bruise/bleed easily.  Psychiatric/Behavioral: Positive for sleep disturbance. Negative for agitation, behavioral problems, confusion, decreased concentration, dysphoric mood, self-injury and suicidal ideas. The patient is not nervous/anxious and is not hyperactive.        Objective:   Physical Exam  Constitutional: He is oriented to person, place, and time. He appears well-developed and well-nourished. No distress.  HENT:  Head: Normocephalic and atraumatic.  Right Ear: External ear normal.  Left Ear: External ear normal.  Eyes: Pupils are equal, round, and reactive to light. Conjunctivae are normal.  Cardiovascular: Normal rate, regular rhythm, normal heart sounds and intact distal pulses.   No murmur heard. Pulmonary/Chest: Effort normal and breath sounds normal. No respiratory distress. He has no wheezes. He has no rales. He exhibits no tenderness.  Musculoskeletal: He exhibits edema and tenderness.       Left elbow: He exhibits swelling. Tenderness  found.  Neurological: He is alert and oriented to person, place, and time.  Skin: Skin is warm and dry. No rash noted. He is not diaphoretic. No erythema. No pallor.  Psychiatric: He has a normal mood and affect. His behavior is normal. Judgment and thought content normal.  Nursing note and vitals reviewed.         Assessment & Plan:   1. RLS (restless legs syndrome)   2. Diabetes mellitus without complication ([place])   3. Essential hypertension   4. Stage 3 chronic kidney disease     RLS (restless legs syndrome) Reviewed Sleep Study results- No sig OSA, no sig central sleep apnea during study. Severe periodic limb movements during the study.  Increase water intake Start Ropinirole 0.25mg  [facility] Practice Sleep Hygiene F/u in 4 weeks to evaluate medication effectiveness.  Diabetes mellitus without complication ([place]) V4U 9/81/19 7.1 Currently taking Metformin 500mg  BID and glimepiride 1mg  each am. He denies episodes of hypoglycemia. Will re-check A1c 08/18   Hypertension BP at goal 132/71 Continue Toprol 50mg  daily  CKD (chronic kidney  disease) Has f/u with Nephrologist early 2019.     FOLLOW-UP:  Return in about 4 weeks (around 07/15/2017) for RLS.

## 2017-06-17 NOTE — Assessment & Plan Note (Signed)
BP at goal 132/71 Continue Toprol 50mg  daily

## 2017-06-17 NOTE — Assessment & Plan Note (Signed)
Has f/u with Nephrologist early 2019.

## 2017-06-19 DIAGNOSIS — M71122 Other infective bursitis, left elbow: Secondary | ICD-10-CM | POA: Diagnosis not present

## 2017-07-03 DIAGNOSIS — M71122 Other infective bursitis, left elbow: Secondary | ICD-10-CM | POA: Diagnosis not present

## 2017-07-04 ENCOUNTER — Encounter: Payer: Medicare Other | Attending: Physical Medicine & Rehabilitation | Admitting: Physical Medicine & Rehabilitation

## 2017-07-04 ENCOUNTER — Telehealth: Payer: Self-pay

## 2017-07-04 ENCOUNTER — Encounter: Payer: Self-pay | Admitting: Physical Medicine & Rehabilitation

## 2017-07-04 VITALS — BP 182/78 | HR 54 | Resp 16

## 2017-07-04 DIAGNOSIS — I2510 Atherosclerotic heart disease of native coronary artery without angina pectoris: Secondary | ICD-10-CM

## 2017-07-04 DIAGNOSIS — I169 Hypertensive crisis, unspecified: Secondary | ICD-10-CM | POA: Diagnosis not present

## 2017-07-04 DIAGNOSIS — M19011 Primary osteoarthritis, right shoulder: Secondary | ICD-10-CM | POA: Insufficient documentation

## 2017-07-04 DIAGNOSIS — E119 Type 2 diabetes mellitus without complications: Secondary | ICD-10-CM | POA: Insufficient documentation

## 2017-07-04 DIAGNOSIS — I252 Old myocardial infarction: Secondary | ICD-10-CM | POA: Diagnosis not present

## 2017-07-04 DIAGNOSIS — M940 Chondrocostal junction syndrome [Tietze]: Secondary | ICD-10-CM | POA: Diagnosis not present

## 2017-07-04 DIAGNOSIS — K219 Gastro-esophageal reflux disease without esophagitis: Secondary | ICD-10-CM | POA: Insufficient documentation

## 2017-07-04 DIAGNOSIS — R0781 Pleurodynia: Secondary | ICD-10-CM | POA: Diagnosis not present

## 2017-07-04 DIAGNOSIS — I10 Essential (primary) hypertension: Secondary | ICD-10-CM | POA: Diagnosis not present

## 2017-07-04 MED ORDER — LIDOCAINE 5 % EX PTCH: 1.0000 | MEDICATED_PATCH | CUTANEOUS | 0 refills | Status: DC

## 2017-07-04 NOTE — Patient Instructions (Signed)
Please follow up with PCP regarding blood pressure: [phone]

## 2017-07-04 NOTE — Progress Notes (Signed)
Subjective:    Patient ID: George Barnett, male    DOB: 1933/04/01, 83 y.o.   MRN: [id number]  HPI  83 y/o male with pmh of right shoulder OA, HTN, GERD, DM, right total shoulder arthroplasty2016 present for right rib pain.   Initially stated: Started 02/2017.  Denies inciting event.  Stable.  Rest improves the pain. Deep inhalation, cough, bending exacerbates the pain. Sharp.  Non-radiating.  Intermittent with positional changes.  Denies associated numbness/tingling.  He saw GI and Cards and was cleared. Ibu without benefit.  Denies falls.  Pain limits certain movement and activities.   Last clinic visit 06/13/17.  Since that time, he has used Voltaren gel without benefit.  His BP is severely elevated, slightly improved on recheck.     Pain Inventory Average Pain 5 Pain Right Now 1 My pain is intermittent and sharp  In the last 24 hours, has pain interfered with the following? General activity 2 Relation with others 0 Enjoyment of life 3 What TIME of day is your pain at its worst? daytime Sleep (in general) Good  Pain is worse with: bending, some activites and with certain types of movement Pain improves with: rest Relief from Meds: 0  Mobility walk without assistance ability to climb steps?  yes do you drive?  yes  Function retired  Neuro/Psych tremor  Prior Studies Any changes since last visit?  no  Physicians involved in your care Primary care Lina Sayre GI MD, Wynonia Lawman MD Cardiology, [place] [facility]   Family History  Problem Relation Age of Onset  . Hypertension Brother    Social History   Social History  . Marital status: Married    Spouse name: N/A  . Number of children: N/A  . Years of education: N/A   Social History Main Topics  . Smoking status: Never Smoker  . Smokeless tobacco: Never Used  . Alcohol use No  . Drug use: No  . Sexual activity: Not on file   Other Topics Concern  . Not on file   Social History Narrative  .  No narrative on file   Past Surgical History:  Procedure Laterality Date  . CARDIAC CATHETERIZATION     1999  . cardio stint    . HERNIA REPAIR     BILAT   . MOHS SURGERY     BILAT EARS   . PROSTATE BIOPSY    . TOTAL SHOULDER ARTHROPLASTY Right 01/24/2015   Procedure: RIGHT TOTAL SHOULDER ARTHROPLASTY;  Surgeon: Marchia Bond, MD;  Location: [place];  Service: Orthopedics;  Laterality: Right;   Past Medical History:  Diagnosis Date  . Cancer [facility])    SKIN CANCER   . Cataract   . Diabetes mellitus without complication ([place])   . Elbow effusion, left   . Frequency of urination   . GERD (gastroesophageal reflux disease)   . Heart attack ([place])   . Heart disease   . Hypertension   . Primary osteoarthritis of right shoulder 01/24/2015  . Prostate enlargement   . Tremors of nervous system   . Urinary retention due to benign prostatic hyperplasia 01/25/2015   There were no vitals taken for this visit.  Opioid Risk Score:   Fall Risk Score:  `1  Depression screen PHQ 2/9  Depression screen [facility] 2/9 06/13/2017 05/05/2017 03/25/2017  Decreased Interest 0 0 0  Down, Depressed, Hopeless 0 0 0  PHQ - 2 Score 0 0 0  Altered sleeping 1 - -  Tired, decreased  energy 0 - -  Change in appetite 2 - -  Feeling bad or failure about yourself  0 - -  Trouble concentrating 0 - -  Moving slowly or fidgety/restless 0 - -  Suicidal thoughts 0 - -  PHQ-9 Score 3 - -    Review of Systems  Constitutional: Positive for appetite change.       Poor  HENT: Negative.   Eyes: Negative.   Respiratory: Negative.   Cardiovascular: Positive for leg swelling.  Gastrointestinal: Positive for constipation.  Endocrine: Negative.   Genitourinary: Negative.   Musculoskeletal:       Rib pain  Skin: Negative.   Allergic/Immunologic: Negative.   Neurological: Positive for tremors.  Hematological: Negative.   Psychiatric/Behavioral: Negative.   All other systems reviewed and are negative.     Objective:    Physical Exam Gen: NAD. Vital signs reviewed HENT: Normocephalic, Atraumatic Eyes: EOMI. No discharge.  Cardio: RRR. No JVD. Pulm: B/l clear to auscultation.  Effort normal Abd: Soft, BS+. Birth deformity MSK:  Gait WNL.   No TTP. Neuro:    Strength  4+/5 in all UE myotomes  Intention tremor  No cognwheel rigidits Skin: Warm and Dry. Intact    Assessment & Plan:  83 y/o male with pmh of right shoulder OA, HTN, GERD, DM, right total shoulder arthroplasty2016 present for right rib pain.   1. Chronic right costochondral rib pain  Films reviewed, neg for PE  Cont Heat/Cold  Voltaren gel ineffective  Will order Lidoderm patch  Will consider Cymbalta  Will consider TENS unit  Will consider PT in future  Will consider injection in future  2. Hypertensive crisis  SBP >210, improved to >180  Encouraged to call PCP ASAP and if unavoidable to go to urgent care

## 2017-07-04 NOTE — Telephone Encounter (Signed)
Patient was seen at Dr. Abigail Miyamoto office today and blood pressure was elevated - 187/78 patient was advised to contact his PCP.  Providers at our location where not in the office. Patient had left Dr Serita Grit office and went home. I advised the patient to wait 20 minutes and take blood pressure at home with his monitor. Patient denies any chest pains, headaches, dizziness or any other symptoms. I called the patient back and spoke with him, he states that the top number is still elevated and was >190.  I advised the patient to go to the Urgent Care or the ER and if he states to have any symptoms; chest pains, headache, or dizziness that the ER would be the best option.  I told the patient that I will call him Monday morning to check in with him to see if blood pressure has regulated and to make him an appointment to follow up or to work him in on Monday if need.  Patient expressed understanding and states that he is going to go to the ER today.  MPulliam, CMA/RT(R)

## 2017-07-07 ENCOUNTER — Telehealth: Payer: Self-pay

## 2017-07-07 DIAGNOSIS — I2510 Atherosclerotic heart disease of native coronary artery without angina pectoris: Secondary | ICD-10-CM | POA: Diagnosis not present

## 2017-07-07 DIAGNOSIS — E785 Hyperlipidemia, unspecified: Secondary | ICD-10-CM | POA: Diagnosis not present

## 2017-07-07 DIAGNOSIS — E119 Type 2 diabetes mellitus without complications: Secondary | ICD-10-CM | POA: Diagnosis not present

## 2017-07-07 DIAGNOSIS — I119 Hypertensive heart disease without heart failure: Secondary | ICD-10-CM | POA: Diagnosis not present

## 2017-07-07 DIAGNOSIS — I252 Old myocardial infarction: Secondary | ICD-10-CM | POA: Diagnosis not present

## 2017-07-07 DIAGNOSIS — R0789 Other chest pain: Secondary | ICD-10-CM | POA: Diagnosis not present

## 2017-07-07 NOTE — Telephone Encounter (Signed)
Called the patient today to follow on discussion with the patient on Friday 07/03/2017 in regards to elevated blood pressure reading.  Patient states that blood pressure readings have went down to 130s/70s.  Patient was seen by his Cardiologist this morning.  Patient was recently given a steroid injection in the shoulder and per patient was told by Cardiologist that this may have increased the blood pressure on Friday.  Patient will follow up if needed.  MPulliam, CMA/RT(R)

## 2017-07-15 ENCOUNTER — Ambulatory Visit (INDEPENDENT_AMBULATORY_CARE_PROVIDER_SITE_OTHER): Payer: Medicare Other | Admitting: Adult Health

## 2017-07-15 ENCOUNTER — Encounter: Payer: Self-pay | Admitting: Adult Health

## 2017-07-15 VITALS — BP 181/84 | HR 57 | Ht 72.0 in | Wt 161.0 lb

## 2017-07-15 DIAGNOSIS — I10 Essential (primary) hypertension: Secondary | ICD-10-CM

## 2017-07-15 DIAGNOSIS — N183 Chronic kidney disease, stage 3 unspecified: Secondary | ICD-10-CM

## 2017-07-15 DIAGNOSIS — E1122 Type 2 diabetes mellitus with diabetic chronic kidney disease: Secondary | ICD-10-CM

## 2017-07-15 DIAGNOSIS — G2581 Restless legs syndrome: Secondary | ICD-10-CM

## 2017-07-15 DIAGNOSIS — I2510 Atherosclerotic heart disease of native coronary artery without angina pectoris: Secondary | ICD-10-CM | POA: Diagnosis not present

## 2017-07-15 LAB — POCT GLYCOSYLATED HEMOGLOBIN (HGB A1C): Hemoglobin A1C: 6.7

## 2017-07-15 LAB — POCT UA - MICROALBUMIN
Creatinine, POC: 50 mg/dL
Microalbumin Ur, POC: 10 mg/L

## 2017-07-15 MED ORDER — LOSARTAN POTASSIUM 25 MG PO TABS: 50.0000 mg | ORAL_TABLET | Freq: Every day | ORAL | 0 refills | Status: DC

## 2017-07-15 NOTE — Assessment & Plan Note (Signed)
He never started ropinirole 0.25mg , he asked that it be d/c'd-done. We will address at a later time.

## 2017-07-15 NOTE — Progress Notes (Signed)
Subjective:    Patient ID: George Barnett, male    DOB: [birth year], 83 y.o.   MRN: [id number]  HPI:  George Barnett is here fore f/u: RLS and started on Ropinirole 0.25mg .  He states that th RLS does not bother him much and he didn't start medication.  He isn't interested in using medication and asks that it be d/c'd-done. He was seen 07/04/17 at [facility] and hus BP was elevated- 182/78.  The provider instructed him to be seen by either his PCP or Cards.  He was seen by Dr. Emmie Niemann on 07/07/17-BP was 130/80 per pt/pt's wife and only medication change was increase in HCTZ from 12.5mg  to 25mg  daily. His wife has been recording his pressure/HR: SBP 120-180s, mean 150s  DBP: 60-80s, mean 70s HR 50-70 He denies current cardiac sx's, but is very concerned that his BP remains elevated. Wife at [facility] during [name].  Patient Care Team    Relationship Specialty Notifications Start End  Esaw Grandchild, NP PCP - General Family Medicine  03/25/17     Patient Active Problem List   Diagnosis Date Noted  . RLS (restless legs syndrome) 06/17/2017  . GERD (gastroesophageal reflux disease) 05/05/2017  . Glossodynia 05/05/2017  . Costochondritis 05/05/2017  . Type 2 diabetes mellitus with stage 3 chronic kidney disease, without long-term current use of insulin ([place]) 05/05/2017  . CKD (chronic kidney disease) 04/22/2017  . Abdominal pain 03/25/2017  . Hypertension 03/25/2017  . CAD (coronary artery disease) 03/25/2017  . Diabetes mellitus without complication ([place]) 98/33/8250  . Snoring 03/25/2017  . Urinary retention due to benign prostatic hyperplasia 01/25/2015  . Primary osteoarthritis of right shoulder 01/24/2015     Past Medical History:  Diagnosis Date  . Cancer [facility])    SKIN CANCER   . Cataract   . Diabetes mellitus without complication ([place])   . Elbow effusion, left   . Frequency of urination   . GERD (gastroesophageal reflux disease)   . Heart attack ([place])   . Heart disease   .  Hypertension   . Primary osteoarthritis of right shoulder 01/24/2015  . Prostate enlargement   . Tremors of nervous system   . Urinary retention due to benign prostatic hyperplasia 01/25/2015     Past Surgical History:  Procedure Laterality Date  . CARDIAC CATHETERIZATION     1999  . cardio stint    . HERNIA REPAIR     BILAT   . MOHS SURGERY     BILAT EARS   . PROSTATE BIOPSY    . TOTAL SHOULDER ARTHROPLASTY Right 01/24/2015   Procedure: RIGHT TOTAL SHOULDER ARTHROPLASTY;  Surgeon: Marchia Bond, MD;  Location: [facility];  Service: Orthopedics;  Laterality: Right;     Family History  Problem Relation Age of Onset  . Hypertension Brother      History  Drug Use No     History  Alcohol Use No     History  Smoking Status  . Never Smoker  Smokeless Tobacco  . Never Used     Outpatient Encounter Prescriptions as of 07/15/2017  Medication Sig  . aspirin 81 MG tablet Take 81 mg by mouth daily.  Marland Kitchen atorvastatin (LIPITOR) 10 MG tablet Take 10-15 mg by mouth daily. Mon Wed Fri 15 mg Sun Tu Th Sa 10 mg  . glimepiride (AMARYL) 1 MG tablet Take 1 tablet (1 mg total) by mouth daily with breakfast.  . hydrochlorothiazide (HYDRODIURIL) 25 MG tablet Take 1 tablet (25 mg  total) by mouth daily.  Marland Kitchen lidocaine (LIDODERM) 5 % Place 1 patch onto the skin daily. Remove & Discard patch within 12 hours or as directed by MD  . metFORMIN (GLUCOPHAGE) 500 MG tablet Take 500-1,000 mg by mouth 2 (two) times daily with a meal. 1 tablet in the morning, 2 tablets in the evening  . metoprolol (LOPRESSOR) 50 MG tablet Take 25 mg by mouth 2 (two) times daily.   Marland Kitchen OVER THE COUNTER MEDICATION Folic Acid 016 mg tab 1 po daily  . OVER THE COUNTER MEDICATION Vitamin B 12 1000 mcg tab 1 po daily  . Polyethyl Glycol-Propyl Glycol (SYSTANE OP) Apply 1 drop to eye 3 (three) times daily as needed (Dry eyes).  . polyethylene glycol (MIRALAX / GLYCOLAX) packet Take 17 g by mouth daily as needed.  . sucralfate  (CARAFATE) 1 g tablet Take 1 tablet by mouth 2 (two) times daily.  . [DISCONTINUED] diclofenac sodium (VOLTAREN) 1 % GEL Apply 2 g topically 4 (four) times daily.  Marland Kitchen losartan (COZAAR) 25 MG tablet Take 2 tablets (50 mg total) by mouth daily.  . [DISCONTINUED] rOPINIRole (REQUIP) 0.25 MG tablet Take 1 tablet (0.25 mg total) by mouth at bedtime. (Patient not taking: Reported on 07/15/2017)   No facility-administered encounter medications on file as of 07/15/2017.     Allergies: Exenatide; Meperidine and related; Nadolol; and Sitagliptin phosphate [sitagliptin]  Body mass index is 21.84 kg/m.  Blood pressure (!) 181/84, pulse (!) 57, height 6' (1.829 m), weight 161 lb (73 kg).  BP re-checked after laying on L side for >10 mins-remained well above goal.    Review of Systems  Constitutional: Positive for fatigue. Negative for activity change, appetite change, chills, diaphoresis, fever and unexpected weight change.  HENT: Negative for congestion.   Eyes: Negative for visual disturbance.  Respiratory: Negative for cough, chest tightness, shortness of breath, wheezing and stridor.   Cardiovascular: Negative for chest pain, palpitations and leg swelling.  Neurological: Negative for dizziness and light-headedness.       Objective:   Physical Exam  Constitutional: He is oriented to person, place, and time. He appears well-developed and well-nourished. No distress.  Cardiovascular: Normal rate, regular rhythm, normal heart sounds and intact distal pulses.   Pulmonary/Chest: Effort normal and breath sounds normal. No respiratory distress. He has no wheezes. He has no rales. He exhibits no tenderness.  Neurological: He is alert and oriented to person, place, and time.  Skin: Skin is warm and dry. No rash noted. He is not diaphoretic. No erythema. No pallor.  Psychiatric: He has a normal mood and affect. His behavior is normal. Judgment and thought content normal. Cognition and memory are normal.   Not anxious, however appeared worried/concerned.          Assessment & Plan:   1. Type 2 diabetes mellitus with stage 3 chronic kidney disease, without long-term current use of insulin ([place])   2. Essential hypertension   3. RLS (restless legs syndrome)   4. Stage 3 chronic kidney disease     Hypertension BP consistently above goal since 07/04/17, his phys med provider advised him to seek care with either PCP or cards. He was seen 07/07/17 by Cards/Dr. Wynonia Lawman, who increased HCTZ from 12.5mg  to 25mg  (despite med rec showing him always taking 25mg )- per pt. When he established care in May with this practice- his med rec listed his anti-hypertensives as: HCTZ 25mg , Losartan 50mg , Toprol 50mg . His last OV on 06/17/17 with our practice,  reflects Losartan listed as an active medication. OV on 8/171/8 at another practice reflects that the losartan 50mg  was d/c'd. He, nor hie wife can explain what happened to the medication. If he has been off his ARB then that may be cause of elevated BP.  CMP drawn today and he was re-started on Losartan 50mg  daily. Advised to call us on Thursday with BP/HR numbers and to f/u with [place] in 4 weeks-sooner if needed.    RLS (restless legs syndrome) He never started ropinirole 0.25mg , he asked that it be d/c'd-done. We will address at a later time.   CKD (chronic kidney disease) Pt denies ever being told that he has CKD. Referral for Nephrology placed.     FOLLOW-UP:  Return in about 4 weeks (around 08/12/2017) for HTN.

## 2017-07-15 NOTE — Assessment & Plan Note (Signed)
Pt denies ever being told that he has CKD. Referral for Nephrology placed.

## 2017-07-15 NOTE — Assessment & Plan Note (Addendum)
BP consistently above goal since 07/04/17, his phys med provider advised him to seek care with either PCP or cards. He was seen 07/07/17 by [facility]/Dr. Wynonia Lawman, who increased HCTZ from 12.5mg  to 25mg  (despite med rec showing him always taking 25mg )- per pt. When he established care in May with this practice- his med rec listed his anti-hypertensives as: HCTZ 25mg , Losartan 50mg , Toprol 50mg . His last OV on 06/17/17 with our practice, reflects Losartan listed as an active medication. OV on 8/171/8 at another practice reflects that the losartan 50mg  was d/c'd. He, nor hie wife can explain what happened to the medication. If he has been off his ARB then that may be cause of elevated BP.  CMP drawn today and he was re-started on Losartan 50mg  daily. Advised to call us on Thursday with BP/HR numbers and to f/u with [place] in 4 weeks-sooner if needed.

## 2017-07-15 NOTE — Patient Instructions (Addendum)

## 2017-07-16 LAB — COMPREHENSIVE METABOLIC PANEL
ALBUMIN: 4.3 g/dL (ref 3.5–4.7)
ALT: 11 IU/L (ref 0–44)
AST: 20 IU/L (ref 0–40)
Albumin/Globulin Ratio: 2 (ref 1.2–2.2)
Alkaline Phosphatase: 58 IU/L (ref 39–117)
BILIRUBIN TOTAL: 0.4 mg/dL (ref 0.0–1.2)
BUN / CREAT RATIO: 19 (ref 10–24)
BUN: 26 mg/dL (ref 8–27)
CALCIUM: 9.3 mg/dL (ref 8.6–10.2)
CHLORIDE: 95 mmol/L — AB (ref 96–106)
CO2: 20 mmol/L (ref 20–29)
CREATININE: 1.34 mg/dL — AB (ref 0.76–1.27)
GFR, EST AFRICAN AMERICAN: 57 mL/min/{1.73_m2} — AB (ref >59)
GFR, EST NON AFRICAN AMERICAN: 49 mL/min/{1.73_m2} — AB (ref >59)
GLUCOSE: 151 mg/dL — AB (ref 65–99)
Globulin, Total: 2.2 g/dL (ref 1.5–4.5)
POTASSIUM: 4.2 mmol/L (ref 3.5–5.2)
Sodium: 137 mmol/L (ref 134–144)
TOTAL PROTEIN: 6.5 g/dL (ref 6.0–8.5)

## 2017-07-23 ENCOUNTER — Encounter: Payer: Self-pay | Admitting: Adult Health

## 2017-07-23 ENCOUNTER — Other Ambulatory Visit: Payer: Self-pay | Admitting: Adult Health

## 2017-07-23 ENCOUNTER — Other Ambulatory Visit: Payer: Self-pay

## 2017-07-23 DIAGNOSIS — I10 Essential (primary) hypertension: Secondary | ICD-10-CM

## 2017-07-23 MED ORDER — LOSARTAN POTASSIUM 25 MG PO TABS: 50.0000 mg | ORAL_TABLET | Freq: Every day | ORAL | 1 refills | Status: DC

## 2017-07-23 NOTE — Telephone Encounter (Signed)
RX for losartan sent earlier to [facility] was sent incorrectly.  RX was supposed to be sent to [facility].  RX resent to correct pharmacy and [facility] contacted to cancel RX.  Charyl Bigger, CMA

## 2017-07-25 DIAGNOSIS — Z85828 Personal history of other malignant neoplasm of skin: Secondary | ICD-10-CM | POA: Diagnosis not present

## 2017-07-25 DIAGNOSIS — L821 Other seborrheic keratosis: Secondary | ICD-10-CM | POA: Diagnosis not present

## 2017-07-25 DIAGNOSIS — L309 Dermatitis, unspecified: Secondary | ICD-10-CM | POA: Diagnosis not present

## 2017-07-25 DIAGNOSIS — D485 Neoplasm of uncertain behavior of skin: Secondary | ICD-10-CM | POA: Diagnosis not present

## 2017-07-25 DIAGNOSIS — L570 Actinic keratosis: Secondary | ICD-10-CM | POA: Diagnosis not present

## 2017-07-31 ENCOUNTER — Other Ambulatory Visit: Payer: Self-pay | Admitting: Adult Health

## 2017-07-31 DIAGNOSIS — E119 Type 2 diabetes mellitus without complications: Secondary | ICD-10-CM

## 2017-07-31 MED ORDER — METFORMIN HCL 500 MG PO TABS: ORAL_TABLET | ORAL | 1 refills | Status: DC

## 2017-08-01 ENCOUNTER — Encounter: Payer: Medicare Other | Admitting: Physical Medicine & Rehabilitation

## 2017-08-05 ENCOUNTER — Encounter: Payer: Self-pay | Admitting: Family Medicine

## 2017-08-05 ENCOUNTER — Ambulatory Visit (INDEPENDENT_AMBULATORY_CARE_PROVIDER_SITE_OTHER): Payer: Medicare Other | Admitting: Family Medicine

## 2017-08-05 VITALS — BP 154/81 | HR 56 | Ht 72.0 in | Wt 162.6 lb

## 2017-08-05 DIAGNOSIS — I152 Hypertension secondary to endocrine disorders: Secondary | ICD-10-CM

## 2017-08-05 DIAGNOSIS — N183 Chronic kidney disease, stage 3 unspecified: Secondary | ICD-10-CM

## 2017-08-05 DIAGNOSIS — E782 Mixed hyperlipidemia: Secondary | ICD-10-CM

## 2017-08-05 DIAGNOSIS — E1159 Type 2 diabetes mellitus with other circulatory complications: Secondary | ICD-10-CM

## 2017-08-05 DIAGNOSIS — E1169 Type 2 diabetes mellitus with other specified complication: Secondary | ICD-10-CM

## 2017-08-05 DIAGNOSIS — I10 Essential (primary) hypertension: Secondary | ICD-10-CM

## 2017-08-05 DIAGNOSIS — R809 Proteinuria, unspecified: Secondary | ICD-10-CM

## 2017-08-05 DIAGNOSIS — E1129 Type 2 diabetes mellitus with other diabetic kidney complication: Secondary | ICD-10-CM | POA: Insufficient documentation

## 2017-08-05 DIAGNOSIS — E1122 Type 2 diabetes mellitus with diabetic chronic kidney disease: Secondary | ICD-10-CM

## 2017-08-05 HISTORY — DX: Type 2 diabetes mellitus with other specified complication: E11.69

## 2017-08-05 HISTORY — DX: Type 2 diabetes mellitus with other circulatory complications: E11.59

## 2017-08-05 HISTORY — DX: Type 2 diabetes mellitus with other diabetic kidney complication: E11.29

## 2017-08-05 HISTORY — DX: Hypertension secondary to endocrine disorders: I15.2

## 2017-08-05 MED ORDER — LOSARTAN POTASSIUM 25 MG PO TABS: 25.0000 mg | ORAL_TABLET | Freq: Every day | ORAL | 1 refills | Status: DC

## 2017-08-05 MED ORDER — METOPROLOL SUCCINATE ER 25 MG PO TB24: 25.0000 mg | ORAL_TABLET | Freq: Every day | ORAL | 0 refills | Status: DC

## 2017-08-05 NOTE — Patient Instructions (Addendum)
When you follow up with your pain doctor please see if he is comfortable performing an intercostal block on you for your costochondritis pain.  If he is not, just asked him to send you to someone else.    - We are decreasing losartan to one half tablet daily or from 50 mg per day down to 25 mg per day.  - Continue to monitor blood pressure at home as well as heart rate.- Keep your log and bring in next office visit like he did today.  - We also went from 25mg  twice a day metoprolol  (12 hour formula) to the 24 hour formula 25mg  tab- in hopes it will inc HR and Bp slightly and cause less dizziness.   - Please try to drink at least 4 bottles of water per day.  This will help the swelling in her legs.  Also try to walk more as this will improve circulation as well.    Dehydration Dehydration is a condition in which there is not enough fluid or water in the body. This happens when you lose more fluids than you take in. Important organs, such as the kidneys, brain, and heart, cannot function without a proper amount of fluids. Any loss of fluids from the body can lead to dehydration. People age 38 or older have a higher risk of dehydration than younger adults because in older age, the body:  Is less able to conserve water.  Does not respond to temperature changes as well.  Does not get thirsty as easily or quickly.  Dehydration can range from mild to severe. This condition should be treated right away to prevent it from becoming severe. What are the causes? Dehydration may be caused by:  Vomiting.  Diarrhea.  Excessive sweating, such as from heat exposure or exercise.  Not drinking enough fluid, especially: ? When ill. ? While doing activity that requires a lot of energy.  Excessive urination.  Fever.  Infection.  Certain medicines, such as medicines that cause the body to lose excess fluid (diuretics).  Inability to access safe drinking water.  Poorly controlled blood  sugars.  Reduced physical ability to get adequate water and food.  What increases the risk? This condition is more likely to develop in people who:  Have a long-term (chronic) illness, such as: ? An illness that may increase urination, such as diabetes. ? Kidney, heart, or lung disease. ? Neurological or psychological disorders, such as dementia.  Are age 59 or older.  Are disabled.  Live in a place with high altitude.  What are the signs or symptoms? Symptoms of mild dehydration may include:  Thirst.  Dry lips.  Slightly dry mouth.  Dry, warm skin.  Dizziness. Symptoms of moderate dehydration may include:  Very dry mouth.  Muscle cramps.  Dark urine. Urine may be the color of tea.  Decreased urine production.  Decreased tear production.  Heartbeat that is irregular or faster than normal (palpitations).  Headache.  Light-headedness, especially when you stand up from a sitting position.  Fainting (syncope). Symptoms of severe dehydration may include:  Changes in skin, such as: ? Cold and clammy skin. ? Blotchy (mottled) or pale skin. ? Skin that does not quickly return to normal after being lightly pinched and released (poor skin turgor).  Changes in body fluids, such as: ? Extreme thirst. ? No tear production. ? Inability to sweat when body temperature is high, such as in hot weather. ? Very little urine production.  Changes in  vital signs, such as: ? Weak pulse. ? Pulse that is more than 100 beats a minute when sitting still. ? Rapid breathing. ? Low blood pressure.  Other changes, such as: ? Sunken eyes. ? Cold hands and feet. ? Confusion. ? Lack of energy (lethargy). ? Difficulty waking up from sleep. ? Short-term weight loss. ? Unconsciousness. How is this diagnosed? This condition is diagnosed based on your symptoms and a physical exam. Blood and urine tests may be done to help confirm the diagnosis. How is this treated? Treatment  for this condition depends on the severity. Mild or moderate dehydration can often be treated at home. Treatment should be started right away. Do not wait until dehydration becomes severe. Severe dehydration is an emergency and it needs to be treated in a hospital. Treatment for mild dehydration may include:  Drinking more fluids.  Replacing salts and minerals in your blood (electrolytes). Treatment for moderate dehydration may include:  Drinking an oral rehydration solution (ORS). This is a drink that helps you replace fluids and electrolytes (rehydrate). It is found at pharmacies and retail stores. Treatment for severe dehydration may include:  Receiving fluids through an IV tube.  Receiving an electrolyte solution through a tube passed through your nose and into your stomach (nasogastric tube or NG tube).  Correcting abnormalities in electrolytes.  Treating the underlying cause of dehydration. Follow these instructions at home:  If directed by your health care provider, drink an ORS: ? Make an ORS by following instructions on the package. ? Start by drinking small amounts, about  cup (120 mL) every 5-10 minutes. ? Slowly increase how much you drink until you have taken the amount recommended by your health care provider.  Drink enough clear fluid to keep your urine clear or pale yellow. If you were told to drink an ORS, finish the ORS first, then start slowly drinking other clear fluids. Drink fluids such as: ? Water. Do not drink only water. Doing that can lead to having too little salt (sodium) in the body (hyponatremia). ? Ice chips. ? Fruit juice that you have added water to (diluted fruit juice). ? Low-calorie sports drinks.  Avoid: ? Alcohol. ? Drinks that contain a lot of sugar. These include high-calorie sports drinks, fruit juice that is not diluted, and soda. ? Caffeine. ? Foods that are greasy or contain a lot of fat or sugar.  Take over-the-counter and  prescription medicines only as told by your health care provider.  Do not take sodium tablets. This can lead to having too much sodium in the body (hypernatremia).  Eat foods that contain a healthy balance of electrolytes, such as bananas, oranges, potatoes, tomatoes, and spinach.  Keep all follow-up visits as told by your health care provider. This is important. Contact a health care provider if:  You have abdominal pain that: ? Gets worse. ? Stays in one area (localizes).  You have a rash.  You have a stiff neck.  You are sleepier, more irritable, or more difficult to wake up than usual.  You feel weak, dizzy, or very thirsty. Get help right away if:  You have: ? Symptoms of severe dehydration. ? A fever. ? A severe headache. ? Vomiting or diarrhea that gets worse or does not go away. ? Diarrhea for more than 24 hours. ? Blood or green matter (bile) in your vomit. ? Blood in your stool. This may cause stool to look black and tarry. ? Trouble breathing.  You cannot drink fluids  without vomiting.  Your symptoms get worse with treatment.  You have not urinated in 6-8 hours.  You have urinated only a small amount of very dark urine over 6-8 hours.  You faint.  Your heart rate while sitting still is over 100 beats a minute. This information is not intended to replace advice given to you by your health care provider. Make sure you discuss any questions you have with your health care provider. Document Released: 01/25/2004 Document Revised: 05/24/2016 Document Reviewed: 12/29/2015 Elsevier Interactive Patient Education  2017 [employer].

## 2017-08-05 NOTE — Progress Notes (Signed)
Impression and Recommendations:    1. Type 2 diabetes mellitus with stage 3 chronic kidney disease, without long-term current use of insulin ([place])   2. Hypertension associated with diabetes ([place])   3. Mixed diabetic hyperlipidemia associated with type 2 diabetes mellitus ([place])   4. Microalbuminuria due to type 2 diabetes mellitus (HCC)    - We are decreasing losartan to one half tablet or from 50 mg per day down to 25 mg per day. - Continue to monitor blood pressure at home as well as heart rate. - We also went from 25mg   twice a day metoprolol  (12 hour formula) to the 24 hour formula 25mg  tab- in hopes it will inc HR and Bp slightly and cause less dizziness - If we do not see significant improvement in blood pressure and heart rate as well as his dizziness resolving some, we will then cut the Toprol XL from 25 mg  Next OV down to 12.5 next ov.  - A1c well controlled and under 7 when checked end of August - Chronic kidney disease stage III-serum creatinine has been stable especially since over 2 years ago his serum creatinine was higher than it is today.  Encouraged patient to drink more water as he has a very elevated BUN and doesn't really drink anything all day. - Encouraged to ambulate to help with any fluid retention in his bilateral lower extremities as well as help control blood sugar - Continue other medicines as prescribed - Follow-up 4 weeks for recheck blood pressure   Type 2 diabetes mellitus with stage 3 chronic kidney disease, without long-term current use of insulin ([place]) - Plan: HM Diabetes Foot Exam, losartan (COZAAR) 25 MG tablet  Hypertension associated with diabetes ([place]) - Plan: losartan (COZAAR) 25 MG tablet, metoprolol succinate (TOPROL XL) 25 MG 24 hr tablet  Mixed diabetic hyperlipidemia associated with type 2 diabetes mellitus ([place])  Microalbuminuria due to type 2 diabetes mellitus ([place]) - Plan: losartan (COZAAR) 25 MG tablet  No problem-specific  Assessment & Plan notes found for this encounter.    Education and routine counseling performed. Handouts provided.   New Prescriptions   METOPROLOL SUCCINATE (TOPROL XL) 25 MG 24 HR TABLET    Take 1 tablet (25 mg total) by mouth daily.    Discontinued Medications   METOPROLOL (LOPRESSOR) 50 MG TABLET    Take 25 mg by mouth 2 (two) times daily.     Modified Medications   Modified Medication Previous Medication   LOSARTAN (COZAAR) 25 MG TABLET losartan (COZAAR) 25 MG tablet      Take 1 tablet (25 mg total) by mouth daily.    Take 2 tablets (50 mg total) by mouth daily.    Orders Placed This Encounter  Procedures  . HM Diabetes Foot Exam     Return for Hypertension follow up 4 wks- bring log.  Decrease losartan from 50 to 25.  The patient was counseled, risk factors were discussed, anticipatory guidance given.  Gross side effects, risk and benefits, and alternatives of medications discussed with patient.  Patient is aware that all medications have potential side effects and we are unable to predict every side effect or drug-drug interaction that may occur.  Expresses verbal understanding and consents to current therapy plan and treatment regimen.  Please see AVS handed out to patient at the end of our visit for further patient instructions/ counseling done pertaining to today's office visit.    Note: This document was prepared  using Systems analyst and may include unintentional dictation errors.     Subjective:    Chief Complaint  Patient presents with  . Follow-up    HPI: George Barnett is a 83 y.o. male who presents to [place] [facility] today for follow up for HTN.     Problem  Type 2 Diabetes Mellitus With Stage 3 Chronic Kidney Disease, Without Long-Term Current Use of Insulin (Hcc)  Hypertension Associated With Diabetes (Hcc)  Mixed Diabetic Hyperlipidemia Associated With Type 2 Diabetes Mellitus (Hcc)    Microalbuminuria Due to Type 2 Diabetes Mellitus (Hcc)   -  This is second time I've seen patient.  The first time was for an acute office visit for costochondritis in which we send to pain management.  He has been on Voltaren gel which did not help much with the pain and then subsequently Lidoderm patches.  This is helping minimally.  He did not have an intercostal block yet.  Per patient, his pain is essentially just as bad now as it was prior.  - Patient has seen my colleague Mina Marble 2 times prior to this office visit in which blood pressure medicines were slightly increased.  This was in response to patient's elevated blood pressure when he was in Dr. Serita Grit office, his pain management doctors office.  They added back his Cozaar which had been discontinued a couple months prior.  Patient not sure why it was.    - Per patient since this medication change last office visit, he's been having some increased dizziness with getting up too quickly.  His blood pressures at home run 131/65, 119/62, 110/64, 101/61, 102/60, 113/59, 111/66, 117/59, 120/71, 131/67,.  His heart rates have ranged from 49-63. -  he denies any chest pain, heart palpitations, difficulty in breathing, increased swelling in his legs and shortness of breath.  Besides orthostatic hypotension resulting in some dizziness, he has no other complaints.  HTN:  -  His blood pressure has been controlled at home.    - Patient reports good compliance with blood pressure medications  - Denies medication S-E   - Smoking Status noted   - He denies new onset of: chest pain, exercise intolerance, shortness of breath, dizziness, visual changes, headache, lower extremity swelling or claudication.   Today their BP is BP: (!) 154/81   Last 3 blood pressure readings in our office are as follows: BP Readings from Last 3 Encounters:  08/05/17 (!) 154/81  07/15/17 (!) 181/84  07/04/17 (!) 182/78    Pulse Readings from Last 3  Encounters:  08/05/17 (!) 56  07/15/17 (!) 57  07/04/17 (!) 54    Filed Weights   08/05/17 1322  Weight: 162 lb 9.6 oz (73.8 kg)      Patient Care Team    Relationship Specialty Notifications Start End  Mina Marble D, NP PCP - General Family Medicine  03/25/17      Lab Results  Component Value Date   CREATININE 1.34 (H) 07/15/2017   BUN 26 07/15/2017   NA 137 07/15/2017   K 4.2 07/15/2017   CL 95 (L) 07/15/2017   CO2 20 07/15/2017    Lab Results  Component Value Date   CHOL 101 02/18/2017    Lab Results  Component Value Date   HDL 47 02/18/2017    Lab Results  Component Value Date   LDLCALC 42 02/18/2017    Lab Results  Component Value Date   TRIG 63  02/18/2017    No results found for: CHOLHDL  No results found for: LDLDIRECT ===================================================================   Patient Active Problem List   Diagnosis Date Noted  . Type 2 diabetes mellitus with stage 3 chronic kidney disease, without long-term current use of insulin ([place]) 05/05/2017    Priority: High  . Hypertension associated with diabetes ([place]) 08/05/2017  . Mixed diabetic hyperlipidemia associated with type 2 diabetes mellitus ([place]) 08/05/2017  . Microalbuminuria due to type 2 diabetes mellitus ([place]) 08/05/2017  . RLS (restless legs syndrome) 06/17/2017  . GERD (gastroesophageal reflux disease) 05/05/2017  . Glossodynia 05/05/2017  . Costochondritis 05/05/2017  . CKD (chronic kidney disease) 04/22/2017  . Abdominal pain 03/25/2017  . Hypertension 03/25/2017  . CAD (coronary artery disease) 03/25/2017  . Diabetes mellitus without complication ([place]) 72/53/6644  . Snoring 03/25/2017  . Urinary retention due to benign prostatic hyperplasia 01/25/2015  . Primary osteoarthritis of right shoulder 01/24/2015  . Edema, peripheral 11/22/2014  . Benign essential hypertension 11/22/2014  . Pure hypercholesterolemia 11/22/2014     Past Medical History:  Diagnosis  Date  . Cancer [facility])    SKIN CANCER   . Cataract   . Diabetes mellitus without complication ([place])   . Elbow effusion, left   . Frequency of urination   . GERD (gastroesophageal reflux disease)   . Heart attack ([place])   . Heart disease   . Hypertension   . Primary osteoarthritis of right shoulder 01/24/2015  . Prostate enlargement   . Tremors of nervous system   . Urinary retention due to benign prostatic hyperplasia 01/25/2015     Past Surgical History:  Procedure Laterality Date  . CARDIAC CATHETERIZATION     1999  . cardio stint    . HERNIA REPAIR     BILAT   . MOHS SURGERY     BILAT EARS   . PROSTATE BIOPSY    . TOTAL SHOULDER ARTHROPLASTY Right 01/24/2015   Procedure: RIGHT TOTAL SHOULDER ARTHROPLASTY;  Surgeon: Marchia Bond, MD;  Location: [place];  Service: Orthopedics;  Laterality: Right;     Family History  Problem Relation Age of Onset  . Hypertension Brother      History  Drug Use No  ,  History  Alcohol Use No  ,  History  Smoking Status  . Never Smoker  Smokeless Tobacco  . Never Used  ,    Current Outpatient Prescriptions on File Prior to Visit  Medication Sig Dispense Refill  . aspirin 81 MG tablet Take 81 mg by mouth daily.    Marland Kitchen atorvastatin (LIPITOR) 10 MG tablet Take 10-15 mg by mouth daily. Mon Wed Fri 15 mg Sun Tu Th Sa 10 mg    . glimepiride (AMARYL) 1 MG tablet Take 1 tablet (1 mg total) by mouth daily with breakfast. 90 tablet 0  . hydrochlorothiazide (HYDRODIURIL) 25 MG tablet Take 1 tablet (25 mg total) by mouth daily. 90 tablet 2  . lidocaine (LIDODERM) 5 % Place 1 patch onto the skin daily. Remove & Discard patch within 12 hours or as directed by MD 30 patch 0  . metFORMIN (GLUCOPHAGE) 500 MG tablet 1 tablet in the morning, 1 tablet in the evening 180 tablet 1  . OVER THE COUNTER MEDICATION Folic Acid 034 mg tab 1 po daily    . OVER THE COUNTER MEDICATION Vitamin B 12 1000 mcg tab 1 po daily    . Polyethyl Glycol-Propyl Glycol (SYSTANE  OP) Apply 1 drop to eye 3 (three)  times daily as needed (Dry eyes).    . polyethylene glycol (MIRALAX / GLYCOLAX) packet Take 17 g by mouth daily as needed.    . sucralfate (CARAFATE) 1 g tablet Take 1 tablet by mouth 2 (two) times daily.  2   No current facility-administered medications on file prior to visit.      Allergies  Allergen Reactions  . Exenatide     nausea  . Meperidine And Related   . Nadolol     Numbness in fingers  . Sitagliptin Phosphate [Sitagliptin]     Weight loss      Review of Systems:   General:  Denies fever, chills Optho/Auditory:   Denies visual changes, blurred vision Respiratory:   Denies SOB, cough, wheeze, DIB  Cardiovascular:   Denies chest pain, palpitations, painful respirations Gastrointestinal:   Denies nausea, vomiting, diarrhea.  Endocrine:     Denies new hot or cold intolerance Musculoskeletal:  Denies joint swelling, gait issues, or new unexplained myalgias/ arthralgias Skin:  Denies rash, suspicious lesions  Neurological:    Denies dizziness, unexplained weakness, numbness  Psychiatric/Behavioral:   Denies mood changes  Objective:    Blood pressure (!) 154/81, pulse (!) 56, height 6' (1.829 m), weight 162 lb 9.6 oz (73.8 kg).  Body mass index is 22.05 kg/m.  General: Well Developed, well nourished, and in no acute distress.  HEENT: Normocephalic, atraumatic, pupils equal round reactive to light, neck supple, No carotid bruits, no JVD Skin: Warm and dry, cap RF less 2 sec Cardiac: Regular rate and rhythm, S1, S2 WNL's, no murmurs rubs or gallops Respiratory: ECTA B/L, Not using accessory muscles, speaking in full sentences. NeuroM-Sk: Ambulates w/o assistance, moves ext * 4 w/o difficulty, sensation grossly intact.  Ext: scant edema b/l lower ext Psych: No HI/SI, judgement and insight good, Euthymic mood. Full Affect.

## 2017-08-12 DIAGNOSIS — K5900 Constipation, unspecified: Secondary | ICD-10-CM | POA: Diagnosis not present

## 2017-08-12 DIAGNOSIS — R109 Unspecified abdominal pain: Secondary | ICD-10-CM | POA: Diagnosis not present

## 2017-08-17 ENCOUNTER — Other Ambulatory Visit: Payer: Self-pay | Admitting: Adult Health

## 2017-08-18 MED ORDER — GLIMEPIRIDE 1 MG PO TABS: 1.0000 mg | ORAL_TABLET | Freq: Every day | ORAL | 0 refills | Status: DC

## 2017-08-22 ENCOUNTER — Encounter: Payer: Medicare Other | Attending: Physical Medicine & Rehabilitation | Admitting: Physical Medicine & Rehabilitation

## 2017-08-22 ENCOUNTER — Encounter: Payer: Self-pay | Admitting: Physical Medicine & Rehabilitation

## 2017-08-22 DIAGNOSIS — E119 Type 2 diabetes mellitus without complications: Secondary | ICD-10-CM | POA: Diagnosis not present

## 2017-08-22 DIAGNOSIS — M940 Chondrocostal junction syndrome [Tietze]: Secondary | ICD-10-CM

## 2017-08-22 DIAGNOSIS — I252 Old myocardial infarction: Secondary | ICD-10-CM | POA: Diagnosis not present

## 2017-08-22 DIAGNOSIS — I10 Essential (primary) hypertension: Secondary | ICD-10-CM | POA: Diagnosis not present

## 2017-08-22 DIAGNOSIS — K219 Gastro-esophageal reflux disease without esophagitis: Secondary | ICD-10-CM | POA: Diagnosis not present

## 2017-08-22 DIAGNOSIS — I2510 Atherosclerotic heart disease of native coronary artery without angina pectoris: Secondary | ICD-10-CM | POA: Diagnosis not present

## 2017-08-22 DIAGNOSIS — R0781 Pleurodynia: Secondary | ICD-10-CM | POA: Insufficient documentation

## 2017-08-22 DIAGNOSIS — M19011 Primary osteoarthritis, right shoulder: Secondary | ICD-10-CM | POA: Insufficient documentation

## 2017-08-22 MED ORDER — LIDOCAINE 5 % EX PTCH: 1.0000 | MEDICATED_PATCH | CUTANEOUS | 1 refills | Status: DC

## 2017-08-22 NOTE — Progress Notes (Signed)
Subjective:    Patient ID: George Barnett, male    DOB: 12/05/1932, 83 y.o.   MRN: [id number]  HPI  83 y/o male with pmh of right shoulder OA, HTN, GERD, DM, right total shoulder arthroplasty2016 present for right rib pain.   Initially stated: Started 02/2017.  Denies inciting event.  Stable.  Rest improves the pain. Deep inhalation, cough, bending exacerbates the pain. Sharp.  Non-radiating.  Intermittent with positional changes.  Denies associated numbness/tingling.  He saw GI and Cards and was cleared. Ibu without benefit.  Denies falls.  Pain limits certain movement and activities.   Last clinic visit 07/04/17. At that time, his BP was was extremely elevated.  It is still elevated today, but he states he takes it 2/day at home and SBP is around 130.  He believes the Lidoderm patches are helping.  He notes significantly relief for the past 2 days.   Pain Inventory Average Pain 6 Pain Right Now 1 My pain is intermittent and sharp  In the last 24 hours, has pain interfered with the following? General activity 0 Relation with others 0 Enjoyment of life 0 What TIME of day is your pain at its worst? daytime Sleep (in general) Good  Pain is worse with: bending and some activites Pain improves with: rest Relief from Meds: 0  Mobility walk without assistance ability to climb steps?  yes do you drive?  yes Do you have any goals in this area?  no  Function retired Do you have any goals in this area?  no  Neuro/Psych No problems in this area tremor  Prior Studies Any changes since last visit?  no  Physicians involved in your care Any changes since last visit?  no   Family History  Problem Relation Age of Onset  . Hypertension Brother    Social History   Social History  . Marital status: Married    Spouse name: N/A  . Number of children: N/A  . Years of education: N/A   Social History Main Topics  . Smoking status: Never Smoker  . Smokeless tobacco: Never Used  .  Alcohol use No  . Drug use: No  . Sexual activity: Not Asked   Other Topics Concern  . None   Social History Narrative  . None   Past Surgical History:  Procedure Laterality Date  . CARDIAC CATHETERIZATION     1999  . cardio stint    . HERNIA REPAIR     BILAT   . MOHS SURGERY     BILAT EARS   . PROSTATE BIOPSY    . TOTAL SHOULDER ARTHROPLASTY Right 01/24/2015   Procedure: RIGHT TOTAL SHOULDER ARTHROPLASTY;  Surgeon: Marchia Bond, MD;  Location: [place];  Service: Orthopedics;  Laterality: Right;   Past Medical History:  Diagnosis Date  . Cancer [facility])    SKIN CANCER   . Cataract   . Diabetes mellitus without complication ([place])   . Elbow effusion, left   . Frequency of urination   . GERD (gastroesophageal reflux disease)   . Heart attack ([place])   . Heart disease   . Hypertension   . Primary osteoarthritis of right shoulder 01/24/2015  . Prostate enlargement   . Tremors of nervous system   . Urinary retention due to benign prostatic hyperplasia 01/25/2015   There were no vitals taken for this visit.  Opioid Risk Score:   Fall Risk Score:  `1  Depression screen PHQ 2/9  Depression screen [facility] 2/9  06/13/2017 05/05/2017 03/25/2017  Decreased Interest 0 0 0  Down, Depressed, Hopeless 0 0 0  PHQ - 2 Score 0 0 0  Altered sleeping 1 - -  Tired, decreased energy 0 - -  Change in appetite 2 - -  Feeling bad or failure about yourself  0 - -  Trouble concentrating 0 - -  Moving slowly or fidgety/restless 0 - -  Suicidal thoughts 0 - -  PHQ-9 Score 3 - -    Review of Systems  Constitutional: Negative.        Poor  HENT: Negative.   Eyes: Negative.   Respiratory: Negative.   Cardiovascular: Negative.   Gastrointestinal: Negative.   Endocrine: Negative.   Genitourinary: Negative.   Musculoskeletal:       Rib pain  Skin: Negative.   Allergic/Immunologic: Negative.   Neurological: Negative.   Hematological: Negative.   Psychiatric/Behavioral: Negative.   All other  systems reviewed and are negative.     Objective:   Physical Exam Gen: NAD. Vital signs reviewed HENT: Normocephalic, Atraumatic Eyes: EOMI. No discharge.  Cardio: RRR. No JVD. Pulm: B/l clear to auscultation.  Effort normal Abd: Soft, BS+. Birth deformity MSK:  Gait WNL.   No TTP. Neuro:    Strength  4+/5 in all UE myotomes  Intention tremor Skin: Warm and Dry. Intact    Assessment & Plan:  83 y/o male with pmh of right shoulder OA, HTN, GERD, DM, right total shoulder arthroplasty 2016 present for right rib pain.   1. Chronic right costochondral rib pain  Films reviewed, neg for PE  Cont Heat/Cold  Voltaren gel ineffective  Cont Lidoderm patch, ?improvement  Will consider Cymbalta  Will consider TENS unit  Will consider PT in future  Will consider injection in future, will likely to reserve intercostal block as a last resort  2. HTN  Elevated today, however pt states WNL at home  Cont ambulatory monitoring

## 2017-08-26 DIAGNOSIS — Z85828 Personal history of other malignant neoplasm of skin: Secondary | ICD-10-CM | POA: Diagnosis not present

## 2017-08-26 DIAGNOSIS — L570 Actinic keratosis: Secondary | ICD-10-CM | POA: Diagnosis not present

## 2017-08-26 DIAGNOSIS — M71122 Other infective bursitis, left elbow: Secondary | ICD-10-CM | POA: Diagnosis not present

## 2017-08-28 DIAGNOSIS — Z23 Encounter for immunization: Secondary | ICD-10-CM | POA: Diagnosis not present

## 2017-09-01 ENCOUNTER — Telehealth: Payer: Self-pay | Admitting: Adult Health

## 2017-09-01 NOTE — Telephone Encounter (Signed)
PCP rcvd pre-op clearance forms, from  [facility]-- PCP request patient have OV prior to completing Clearance -- Left pt VM regarding OV requirement--glh

## 2017-09-03 ENCOUNTER — Encounter: Payer: Self-pay | Admitting: Family Medicine

## 2017-09-03 ENCOUNTER — Ambulatory Visit (INDEPENDENT_AMBULATORY_CARE_PROVIDER_SITE_OTHER): Payer: Medicare Other | Admitting: Family Medicine

## 2017-09-03 VITALS — BP 128/76 | HR 60 | Ht 72.0 in | Wt 163.7 lb

## 2017-09-03 DIAGNOSIS — E1159 Type 2 diabetes mellitus with other circulatory complications: Secondary | ICD-10-CM | POA: Diagnosis not present

## 2017-09-03 DIAGNOSIS — R809 Proteinuria, unspecified: Secondary | ICD-10-CM

## 2017-09-03 DIAGNOSIS — N183 Chronic kidney disease, stage 3 (moderate): Secondary | ICD-10-CM

## 2017-09-03 DIAGNOSIS — E1122 Type 2 diabetes mellitus with diabetic chronic kidney disease: Secondary | ICD-10-CM

## 2017-09-03 DIAGNOSIS — I10 Essential (primary) hypertension: Secondary | ICD-10-CM | POA: Diagnosis not present

## 2017-09-03 DIAGNOSIS — E1129 Type 2 diabetes mellitus with other diabetic kidney complication: Secondary | ICD-10-CM | POA: Diagnosis not present

## 2017-09-03 DIAGNOSIS — E1169 Type 2 diabetes mellitus with other specified complication: Secondary | ICD-10-CM | POA: Diagnosis not present

## 2017-09-03 DIAGNOSIS — E782 Mixed hyperlipidemia: Secondary | ICD-10-CM

## 2017-09-03 DIAGNOSIS — I2510 Atherosclerotic heart disease of native coronary artery without angina pectoris: Secondary | ICD-10-CM | POA: Diagnosis not present
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
## 2017-09-03 NOTE — Patient Instructions (Addendum)
Please continue to monitor your blood pressure and BS as you are doing.  If it starts to swing higher or lower, please let me know.    - If you change your mind and would like to get your heart looked at ( echo), or carotid ultrasounds looked at etc., than let me know  - Or if you change your mind and would like to go for vestibular rehabilitation and dizziness rehabilitation to learn exercises to help with your dizziness, please let me know.    How to Increase Your Level of Physical Activity  Getting regular physical activity is important for your overall health and well-being. Most people do not get enough exercise. There are easy ways to increase your level of physical activity, even if you have not been very active in the past or you are just starting out. Why is physical activity important? Physical activity has many short-term and long-term health benefits. Regular exercise can:  Help you lose weight or maintain a healthy weight.  Strengthen your muscles and bones.  Boost your mood and improve self-esteem.  Reduce your risk of certain long-term (chronic) diseases, like heart disease, cancer, and diabetes.  Help you stay capable of walking and moving around (mobile) as you age.  Prevent accidents, such as falls, as you age.  Increase life expectancy.  What are the benefits of being physically active on a regular basis? In addition to improving your physical health, being physically active on most days of the week can help you in ways that you may not expect. Benefits of regular physical activity may include:  Feeling good about your body.  Being able to move around more easily and for longer periods of time without getting tired (increased stamina).  Finding new sources of fun and enjoyment.  Meeting new people who share a common interest.  Being able to fight off illness better (enhanced immunity).  Being able to sleep better.  What can happen if I am not physically  active on a regular basis? Not getting enough physical activity can lead to an unhealthy lifestyle and future health problems. This can increase your chances of:  Becoming overweight or obese.  Becoming sick.  Developing chronic illnesses, like heart disease or diabetes.  Having mental health problems, like depression or anxiety.  Having sleep problems.  Having trouble walking or getting yourself around (reduced mobility).  Injuring yourself in a fall as you get older.  What steps can I take to be more physically active?  Check with your health care provider about how to get started. Ask your health care provider what activities are safe for you.  Start out slowly. Walking or doing some simple chair exercises is a good place to start, especially if you have not been active before or for a long time.  Try to find activities that you enjoy. You are more likely to commit to an exercise routine if it does not feel like a chore.  If you have bone or joint problems, choose low-impact exercises, like walking or swimming.  Include physical activity in your everyday routine.  Invite friends or family members to exercise with you. This also will help you commit to your workout plan.  Set goals that you can work toward.  Aim for at least 150 minutes of moderate-intensity exercise each week. Examples of moderate-intensity exercise include walking or riding a bike. Where to find more information:  Centers for Disease Control and Prevention: BowlingG[employer].is  President's Council on [employer], Sports &  Nutrition www.http://villegas.org/  ChooseMyPlate: WirelessM[employer].dk Contact a health care provider if:  You have headaches, muscle aches, or joint pain.  You feel dizzy or light-headed while exercising.  You faint.  You have chest pain while exercising. Summary  Exercise benefits your mind and body at any age, even if you are just  starting out.  If you have a chronic illness or have not been active for a while, check with your health care provider before increasing your physical activity.  Choose activities that are safe and enjoyable for you.Ask your health care provider what activities are safe for you.  Start slowly. Tell your health care provider if you have problems as you start to increase your activity level. This information is not intended to replace advice given to you by your health care provider. Make sure you discuss any questions you have with your health care provider. Document Released: 10/24/2016 Document Revised: 10/24/2016 Document Reviewed: 10/24/2016 Elsevier Interactive Patient Education  [employer].

## 2017-09-12 ENCOUNTER — Encounter: Payer: Self-pay | Admitting: Adult Health

## 2017-09-19 DIAGNOSIS — N3000 Acute cystitis without hematuria: Secondary | ICD-10-CM | POA: Diagnosis not present

## 2017-09-19 DIAGNOSIS — B356 Tinea cruris: Secondary | ICD-10-CM | POA: Diagnosis not present

## 2017-09-22 ENCOUNTER — Other Ambulatory Visit: Payer: Self-pay

## 2017-09-22 MED ORDER — METFORMIN HCL 500 MG PO TABS: ORAL_TABLET | ORAL | 1 refills | Status: DC

## 2017-09-22 NOTE — Telephone Encounter (Signed)
Received fax from [facility] requesting refill for metformin.  Previous RX was sent to [facility].  Refills to [facility] authorized d/t change in pharmacy.  Charyl Bigger, CMA

## 2017-09-25 ENCOUNTER — Ambulatory Visit: Payer: Medicare Other | Admitting: Family Medicine

## 2017-10-03 DIAGNOSIS — L304 Erythema intertrigo: Secondary | ICD-10-CM | POA: Diagnosis not present

## 2017-10-03 DIAGNOSIS — Z85828 Personal history of other malignant neoplasm of skin: Secondary | ICD-10-CM | POA: Diagnosis not present

## 2017-10-14 ENCOUNTER — Other Ambulatory Visit: Payer: Self-pay

## 2017-10-14 DIAGNOSIS — I152 Hypertension secondary to endocrine disorders: Secondary | ICD-10-CM

## 2017-10-14 DIAGNOSIS — R809 Proteinuria, unspecified: Secondary | ICD-10-CM

## 2017-10-14 DIAGNOSIS — E1129 Type 2 diabetes mellitus with other diabetic kidney complication: Secondary | ICD-10-CM

## 2017-10-14 DIAGNOSIS — E1159 Type 2 diabetes mellitus with other circulatory complications: Secondary | ICD-10-CM

## 2017-10-14 DIAGNOSIS — I10 Essential (primary) hypertension: Secondary | ICD-10-CM

## 2017-10-14 DIAGNOSIS — N183 Chronic kidney disease, stage 3 (moderate): Principal | ICD-10-CM

## 2017-10-14 DIAGNOSIS — E1122 Type 2 diabetes mellitus with diabetic chronic kidney disease: Secondary | ICD-10-CM

## 2017-10-14 MED ORDER — METOPROLOL SUCCINATE ER 25 MG PO TB24: 25.0000 mg | ORAL_TABLET | Freq: Every day | ORAL | 1 refills | Status: DC

## 2017-10-14 MED ORDER — LOSARTAN POTASSIUM 25 MG PO TABS: 25.0000 mg | ORAL_TABLET | Freq: Every day | ORAL | 1 refills | Status: DC

## 2017-10-14 NOTE — Telephone Encounter (Signed)
Pharmacy sent refill request for Metoprolol and Losartan, sent refills into the pharmacy. MPulliam, CMA/RT(R)

## 2017-10-22 ENCOUNTER — Encounter: Payer: Medicare Other | Attending: Physical Medicine & Rehabilitation | Admitting: Physical Medicine & Rehabilitation

## 2017-10-22 ENCOUNTER — Encounter: Payer: Self-pay | Admitting: Physical Medicine & Rehabilitation

## 2017-10-22 VITALS — BP 172/82 | HR 54

## 2017-10-22 DIAGNOSIS — R0781 Pleurodynia: Secondary | ICD-10-CM | POA: Insufficient documentation

## 2017-10-22 DIAGNOSIS — M940 Chondrocostal junction syndrome [Tietze]: Secondary | ICD-10-CM

## 2017-10-22 DIAGNOSIS — I2510 Atherosclerotic heart disease of native coronary artery without angina pectoris: Secondary | ICD-10-CM | POA: Diagnosis not present

## 2017-10-22 DIAGNOSIS — M19011 Primary osteoarthritis, right shoulder: Secondary | ICD-10-CM | POA: Insufficient documentation

## 2017-10-22 DIAGNOSIS — K219 Gastro-esophageal reflux disease without esophagitis: Secondary | ICD-10-CM | POA: Diagnosis not present

## 2017-10-22 DIAGNOSIS — I252 Old myocardial infarction: Secondary | ICD-10-CM | POA: Diagnosis not present

## 2017-10-22 DIAGNOSIS — I10 Essential (primary) hypertension: Secondary | ICD-10-CM | POA: Diagnosis not present

## 2017-10-22 DIAGNOSIS — E119 Type 2 diabetes mellitus without complications: Secondary | ICD-10-CM | POA: Insufficient documentation

## 2017-10-22 MED ORDER — LIDOCAINE 5 % EX PTCH: 1.0000 | MEDICATED_PATCH | CUTANEOUS | 2 refills | Status: DC

## 2017-10-22 NOTE — Progress Notes (Signed)
Subjective:    Patient ID: George Barnett, male    DOB: [birth year], 83 y.o.   MRN: [id number]  HPI  83 y/o male with pmh of right shoulder OA, HTN, GERD, DM, right total shoulder arthroplasty2016 present for follow up for right rib pain.   Initially stated: Started 02/2017.  Denies inciting event.  Stable.  Rest improves the pain. Deep inhalation, cough, bending exacerbates the pain. Sharp.  Non-radiating.  Intermittent with positional changes.  Denies associated numbness/tingling.  He saw GI and Cards and was cleared. Ibu without benefit.  Denies falls.  Pain limits certain movement and activities.   Last clinic visit 08/22/17. Since last visit, pt states his pain has significantly improved.  He had 1 day of exacerbation, but it has been controlled for the most part.  He continues lidoderm patches. His BP remains elevated, but states it is WNL at home.    Pain Inventory Average Pain 1 Pain Right Now 0 My pain is intermittent and sharp  In the last 24 hours, has pain interfered with the following? General activity 0 Relation with others 0 Enjoyment of life 0 What TIME of day is your pain at its worst? daytime Sleep (in general) Good  Pain is worse with: bending and some activites Pain improves with: rest Relief from Meds: 0  Mobility walk without assistance ability to climb steps?  yes do you drive?  yes Do you have any goals in this area?  no  Function retired Do you have any goals in this area?  no  Neuro/Psych No problems in this area tremor  Prior Studies Any changes since last visit?  no  Physicians involved in your care Any changes since last visit?  no   Family History  Problem Relation Age of Onset  . Hypertension Brother    Social History   Socioeconomic History  . Marital status: Married    Spouse name: Not on file  . Number of children: Not on file  . Years of education: Not on file  . Highest education level: Not on file  Social Needs  .  Financial resource strain: Not on file  . Food insecurity - worry: Not on file  . Food insecurity - inability: Not on file  . Transportation needs - medical: Not on file  . Transportation needs - non-medical: Not on file  Occupational History  . Not on file  Tobacco Use  . Smoking status: Never Smoker  . Smokeless tobacco: Never Used  Substance and Sexual Activity  . Alcohol use: No  . Drug use: No  . Sexual activity: Not on file  Other Topics Concern  . Not on file  Social History Narrative  . Not on file   Past Surgical History:  Procedure Laterality Date  . CARDIAC CATHETERIZATION     1999  . cardio stint    . HERNIA REPAIR     BILAT   . MOHS SURGERY     BILAT EARS   . PROSTATE BIOPSY    . TOTAL SHOULDER ARTHROPLASTY Right 01/24/2015   Procedure: RIGHT TOTAL SHOULDER ARTHROPLASTY;  Surgeon: Marchia Bond, MD;  Location: [facility];  Service: Orthopedics;  Laterality: Right;   Past Medical History:  Diagnosis Date  . Cancer [facility])    SKIN CANCER   . Cataract   . Diabetes mellitus without complication ([place])   . Elbow effusion, left   . Frequency of urination   . GERD (gastroesophageal reflux disease)   . Heart  attack ([place])   . Heart disease   . Hypertension   . Primary osteoarthritis of right shoulder 01/24/2015  . Prostate enlargement   . Tremors of nervous system   . Urinary retention due to benign prostatic hyperplasia 01/25/2015   BP (!) 172/82   Pulse (!) 54   SpO2 95%   Opioid Risk Score:   Fall Risk Score:  `1  Depression screen PHQ 2/9  Depression screen [facility] 2/9 09/03/2017 06/13/2017 05/05/2017 03/25/2017  Decreased Interest 0 0 0 0  Down, Depressed, Hopeless 0 0 0 0  PHQ - 2 Score 0 0 0 0  Altered sleeping 0 1 - -  Tired, decreased energy 0 0 - -  Change in appetite 0 2 - -  Feeling bad or failure about yourself  0 0 - -  Trouble concentrating 0 0 - -  Moving slowly or fidgety/restless 0 0 - -  Suicidal thoughts 0 0 - -  PHQ-9 Score 0 3 - -  Difficult  doing work/chores Not difficult at all - - -    Review of Systems  Constitutional: Negative.        Poor  HENT: Negative.   Eyes: Negative.   Respiratory: Negative.   Cardiovascular: Negative.   Gastrointestinal: Negative.   Endocrine: Negative.   Genitourinary: Negative.   Musculoskeletal: Negative.   Skin: Negative.   Allergic/Immunologic: Negative.   Neurological: Negative.   Hematological: Negative.   Psychiatric/Behavioral: Negative.   All other systems reviewed and are negative.     Objective:   Physical Exam Gen: NAD. Vital signs reviewed HENT: Normocephalic, Atraumatic Eyes: EOMI. No discharge.  Cardio: RRR. No JVD. Pulm: B/l clear to auscultation.  Effort normal  Abd: Soft, BS+. Birth deformity MSK:  Gait WNL.   No TTP. Neuro:    Strength  4+/5 in all UE myotomes  Intention tremor Skin: Warm and Dry. Intact    Assessment & Plan:  83 y/o male with pmh of right shoulder OA, HTN, GERD, DM, right total shoulder arthroplasty 2016 present for follow up for right rib pain.   1. Chronic right costochondral rib pain  Films reviewed, neg for PE  Cont Heat/Cold  Voltaren gel ineffective  Cont Lidoderm patch, with improvement, will consider weaning on next visit if pain remains controlled  Will consider Cymbalta  Will consider TENS unit  Will consider PT in future  Will consider injection in future, would like to reserve intercostal block as a last resort  2. HTN  Elevated today, however pt states WNL at home  Cont ambulatory monitoring

## 2017-10-22 NOTE — Addendum Note (Signed)
Addended by: Delice Lesch A on: 10/22/2017 12:11 PM   Modules accepted: Orders

## 2017-11-06 ENCOUNTER — Ambulatory Visit: Payer: Medicare Other | Admitting: Family Medicine

## 2017-11-16 ENCOUNTER — Other Ambulatory Visit: Payer: Self-pay | Admitting: Adult Health

## 2017-11-17 ENCOUNTER — Encounter: Payer: Self-pay | Admitting: Family Medicine

## 2017-11-17 ENCOUNTER — Ambulatory Visit (INDEPENDENT_AMBULATORY_CARE_PROVIDER_SITE_OTHER): Payer: Medicare Other | Admitting: Family Medicine

## 2017-11-17 VITALS — BP 128/73 | HR 58 | Ht 72.0 in | Wt 164.9 lb

## 2017-11-17 DIAGNOSIS — E1122 Type 2 diabetes mellitus with diabetic chronic kidney disease: Secondary | ICD-10-CM

## 2017-11-17 DIAGNOSIS — R809 Proteinuria, unspecified: Secondary | ICD-10-CM | POA: Diagnosis not present

## 2017-11-17 DIAGNOSIS — E1159 Type 2 diabetes mellitus with other circulatory complications: Secondary | ICD-10-CM

## 2017-11-17 DIAGNOSIS — E1129 Type 2 diabetes mellitus with other diabetic kidney complication: Secondary | ICD-10-CM | POA: Diagnosis not present

## 2017-11-17 DIAGNOSIS — R600 Localized edema: Secondary | ICD-10-CM | POA: Diagnosis not present

## 2017-11-17 DIAGNOSIS — I10 Essential (primary) hypertension: Secondary | ICD-10-CM | POA: Diagnosis not present

## 2017-11-17 DIAGNOSIS — I152 Hypertension secondary to endocrine disorders: Secondary | ICD-10-CM

## 2017-11-17 DIAGNOSIS — N183 Chronic kidney disease, stage 3 unspecified: Secondary | ICD-10-CM

## 2017-11-17 DIAGNOSIS — I2510 Atherosclerotic heart disease of native coronary artery without angina pectoris: Secondary | ICD-10-CM

## 2017-11-17 HISTORY — DX: Localized edema: R60.0

## 2017-11-17 NOTE — Patient Instructions (Addendum)
Please try to use that wedge pillow or postoperative knee surgery type pillow to elevate your feet whenever you are laying down and watching TV or in your bed.  It is important you continue to drink enough water as well as try to get up and move around as much as possible to promote more blood return back to the heart.  Please try to minimize any time you just spends sitting in a chair with her feet downward.  -Discussed with patient to continue to monitor his blood pressure and blood sugar.  Will continue current medications. -Since relatively new on starting his losartan we will recheck CMP today and his A1c. -Discussed importance of elevating his feet for his bilateral lower extremity dependent edema.  Also discussed loss bands which can be wrapped around his feet by his wife to help promote blood return.  Encouraged him to get a pillow to elevate his feet whenever he is laying around the house watching TV etc.  Can use at night as well.   Peripheral Edema Peripheral edema is swelling that is caused by a buildup of fluid. Peripheral edema most often affects the lower legs, ankles, and feet. It can also develop in the arms, hands, and face. The area of the body that has peripheral edema will look swollen. It may also feel heavy or warm. Your clothes may start to feel tight. Pressing on the area may make a temporary dent in your skin. You may not be able to move your arm or leg as much as usual. There are many causes of peripheral edema. It can be a complication of other diseases, such as congestive heart failure, kidney disease, or a problem with your blood circulation. It also can be a side effect of certain medicines. It often happens to women during pregnancy. Sometimes, the cause is not known. Treating the underlying condition is often the only treatment for peripheral edema. Follow these instructions at home: Pay attention to any changes in your symptoms. Take these actions to help with your  discomfort:  Raise (elevate) your legs while you are sitting or lying down.  Move around often to prevent stiffness and to lessen swelling. Do not sit or stand for long periods of time.  Wear support stockings as told by your health care provider.  Follow instructions from your health care provider about limiting salt (sodium) in your diet. Sometimes eating less salt can reduce swelling.  Take over-the-counter and prescription medicines only as told by your health care provider. Your health care provider may prescribe medicine to help your body get rid of excess water (diuretic).  Keep all follow-up visits as told by your health care provider. This is important.  Contact a health care provider if:  You have a fever.  Your edema starts suddenly or is getting worse, especially if you are pregnant or have a medical condition.  You have swelling in only one leg.  You have increased swelling and pain in your legs. Get help right away if:  You develop shortness of breath, especially when you are lying down.  You have pain in your chest or abdomen.  You feel weak.  You faint. This information is not intended to replace advice given to you by your health care provider. Make sure you discuss any questions you have with your health care provider. Document Released: 12/12/2004 Document Revised: 04/08/2016 Document Reviewed: 05/17/2015 Elsevier Interactive Patient Education  [employer].
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
## 2017-11-18 LAB — COMPREHENSIVE METABOLIC PANEL
ALBUMIN: 4 g/dL (ref 3.5–4.7)
ALK PHOS: 61 IU/L (ref 39–117)
ALT: 11 IU/L (ref 0–44)
AST: 19 IU/L (ref 0–40)
Albumin/Globulin Ratio: 1.8 (ref 1.2–2.2)
BILIRUBIN TOTAL: 0.2 mg/dL (ref 0.0–1.2)
BUN / CREAT RATIO: 21 (ref 10–24)
BUN: 29 mg/dL — AB (ref 8–27)
CHLORIDE: 104 mmol/L (ref 96–106)
CO2: 25 mmol/L (ref 20–29)
Calcium: 9.1 mg/dL (ref 8.6–10.2)
Creatinine, Ser: 1.37 mg/dL — ABNORMAL HIGH (ref 0.76–1.27)
GFR calc Af Amer: 55 mL/min/{1.73_m2} — ABNORMAL LOW (ref >59)
GFR calc non Af Amer: 48 mL/min/{1.73_m2} — ABNORMAL LOW (ref >59)
GLUCOSE: 132 mg/dL — AB (ref 65–99)
Globulin, Total: 2.2 g/dL (ref 1.5–4.5)
Potassium: 5 mmol/L (ref 3.5–5.2)
SODIUM: 142 mmol/L (ref 134–144)
Total Protein: 6.2 g/dL (ref 6.0–8.5)

## 2017-11-18 LAB — HEMOGLOBIN A1C
Est. average glucose Bld gHb Est-mCnc: 148 mg/dL
Hgb A1c MFr Bld: 6.8 % — ABNORMAL HIGH (ref 4.8–5.6)

## 2017-11-24 DIAGNOSIS — Z85828 Personal history of other malignant neoplasm of skin: Secondary | ICD-10-CM | POA: Diagnosis not present

## 2017-11-24 DIAGNOSIS — C44622 Squamous cell carcinoma of skin of right upper limb, including shoulder: Secondary | ICD-10-CM | POA: Diagnosis not present

## 2017-11-24 DIAGNOSIS — L245 Irritant contact dermatitis due to other chemical products: Secondary | ICD-10-CM | POA: Diagnosis not present

## 2017-11-24 DIAGNOSIS — L570 Actinic keratosis: Secondary | ICD-10-CM | POA: Diagnosis not present

## 2017-11-24 DIAGNOSIS — L304 Erythema intertrigo: Secondary | ICD-10-CM | POA: Diagnosis not present

## 2017-12-17 DIAGNOSIS — D23122 Other benign neoplasm of skin of left lower eyelid, including canthus: Secondary | ICD-10-CM | POA: Diagnosis not present

## 2017-12-18 ENCOUNTER — Other Ambulatory Visit: Payer: Self-pay

## 2017-12-18 NOTE — Telephone Encounter (Signed)
Patient request refill on Lipitor, medication was last filled by pervious provider.  Sent request to Dr. Raliegh Scarlet to review. LOV 11/17/2018. MPulliam, CMA/RT(R)

## 2017-12-23 MED ORDER — ATORVASTATIN CALCIUM 10 MG PO TABS: 10.0000 mg | ORAL_TABLET | Freq: Every day | ORAL | 1 refills | Status: DC

## 2017-12-25 DIAGNOSIS — Z85828 Personal history of other malignant neoplasm of skin: Secondary | ICD-10-CM | POA: Diagnosis not present

## 2017-12-25 DIAGNOSIS — L0889 Other specified local infections of the skin and subcutaneous tissue: Secondary | ICD-10-CM | POA: Diagnosis not present

## 2017-12-25 DIAGNOSIS — L738 Other specified follicular disorders: Secondary | ICD-10-CM | POA: Diagnosis not present

## 2018-01-15 ENCOUNTER — Encounter: Payer: Self-pay | Admitting: Physical Medicine & Rehabilitation

## 2018-01-15 ENCOUNTER — Encounter: Payer: Medicare Other | Attending: Physical Medicine & Rehabilitation | Admitting: Physical Medicine & Rehabilitation

## 2018-01-15 VITALS — BP 142/72 | HR 54 | Resp 14

## 2018-01-15 DIAGNOSIS — K219 Gastro-esophageal reflux disease without esophagitis: Secondary | ICD-10-CM | POA: Insufficient documentation

## 2018-01-15 DIAGNOSIS — I10 Essential (primary) hypertension: Secondary | ICD-10-CM | POA: Diagnosis not present

## 2018-01-15 DIAGNOSIS — E119 Type 2 diabetes mellitus without complications: Secondary | ICD-10-CM | POA: Diagnosis not present

## 2018-01-15 DIAGNOSIS — R0781 Pleurodynia: Secondary | ICD-10-CM | POA: Insufficient documentation

## 2018-01-15 DIAGNOSIS — M19011 Primary osteoarthritis, right shoulder: Secondary | ICD-10-CM | POA: Diagnosis not present

## 2018-01-15 DIAGNOSIS — M940 Chondrocostal junction syndrome [Tietze]: Secondary | ICD-10-CM | POA: Diagnosis not present

## 2018-01-15 DIAGNOSIS — I252 Old myocardial infarction: Secondary | ICD-10-CM | POA: Diagnosis not present

## 2018-01-15 NOTE — Progress Notes (Signed)
Subjective:    Patient ID: George Barnett, male    DOB: 28-Jan-1933, 84 y.o.   MRN: [id number]  HPI  84 y/o male with pmh of right shoulder OA, HTN, GERD, DM, right total shoulder arthroplasty2016 present for follow up for right rib pain.   Initially stated: Started 02/2017.  Denies inciting event.  Stable.  Rest improves the pain. Deep inhalation, cough, bending exacerbates the pain. Sharp.  Non-radiating.  Intermittent with positional changes.  Denies associated numbness/tingling.  He saw GI and Cards and was cleared. Ibu without benefit.  Denies falls.  Pain limits certain movement and activities.   Last clinic visit 10/22/17. Since last visit, pt states that his pain has significantly improved.  He notes it has not completely resolved, but seems to last for seconds intermittently.  He continues to use lidoderm patch. BP is better controlled.    Pain Inventory Average Pain 0 Pain Right Now 0 My pain is intermittent  In the last 24 hours, has pain interfered with the following? General activity 0 Relation with others 0 Enjoyment of life 0 What TIME of day is your pain at its worst? daytime Sleep (in general) Good  Pain is worse with: unsure Pain improves with: no selection Relief from Meds: 0  Mobility walk without assistance ability to climb steps?  yes do you drive?  yes Do you have any goals in this area?  no  Function retired Do you have any goals in this area?  no  Neuro/Psych tremor  Prior Studies Any changes since last visit?  no  Physicians involved in your care Any changes since last visit?  no   Family History  Problem Relation Age of Onset  . Hypertension Brother    Social History   Socioeconomic History  . Marital status: Married    Spouse name: None  . Number of children: None  . Years of education: None  . Highest education level: None  Social Needs  . Financial resource strain: None  . Food insecurity - worry: None  . Food insecurity -  inability: None  . Transportation needs - medical: None  . Transportation needs - non-medical: None  Occupational History  . None  Tobacco Use  . Smoking status: Never Smoker  . Smokeless tobacco: Never Used  Substance and Sexual Activity  . Alcohol use: No  . Drug use: No  . Sexual activity: None  Other Topics Concern  . None  Social History Narrative  . None   Past Surgical History:  Procedure Laterality Date  . CARDIAC CATHETERIZATION     1999  . cardio stint    . HERNIA REPAIR     BILAT   . MOHS SURGERY     BILAT EARS   . PROSTATE BIOPSY    . TOTAL SHOULDER ARTHROPLASTY Right 01/24/2015   Procedure: RIGHT TOTAL SHOULDER ARTHROPLASTY;  Surgeon: Marchia Bond, MD;  Location: [facility];  Service: Orthopedics;  Laterality: Right;   Past Medical History:  Diagnosis Date  . Cancer [facility])    SKIN CANCER   . Cataract   . Diabetes mellitus without complication ([place])   . Elbow effusion, left   . Frequency of urination   . GERD (gastroesophageal reflux disease)   . Heart attack ([place])   . Heart disease   . Hypertension   . Primary osteoarthritis of right shoulder 01/24/2015  . Prostate enlargement   . Tremors of nervous system   . Urinary retention due to benign prostatic  hyperplasia 01/25/2015   BP (!) 142/72 (BP Location: Left Arm, Patient Position: Sitting, Cuff Size: Normal)   Pulse (!) 54   Resp 14   SpO2 98%   Opioid Risk Score:   Fall Risk Score:  `1  Depression screen PHQ 2/9  Depression screen [facility] 2/9 11/17/2017 09/03/2017 06/13/2017 05/05/2017 03/25/2017  Decreased Interest 0 0 0 0 0  Down, Depressed, Hopeless 0 0 0 0 0  PHQ - 2 Score 0 0 0 0 0  Altered sleeping 0 0 1 - -  Tired, decreased energy 0 0 0 - -  Change in appetite 0 0 2 - -  Feeling bad or failure about yourself  0 0 0 - -  Trouble concentrating 0 0 0 - -  Moving slowly or fidgety/restless 0 0 0 - -  Suicidal thoughts 0 0 0 - -  PHQ-9 Score 0 0 3 - -  Difficult doing work/chores - Not difficult at  all - - -    Review of Systems  Constitutional: Negative.        Poor  HENT: Negative.   Eyes: Negative.   Respiratory: Negative.   Cardiovascular: Negative.   Gastrointestinal: Negative.   Endocrine: Negative.   Genitourinary: Negative.   Musculoskeletal: Negative.   Skin: Negative.   Allergic/Immunologic: Negative.   Neurological: Positive for tremors.  Hematological: Negative.   Psychiatric/Behavioral: Negative.   All other systems reviewed and are negative.     Objective:   Physical Exam Gen: NAD. Vital signs reviewed HENT: Normocephalic, Atraumatic Eyes: EOMI. No discharge.  Cardio: RRR. No JVD. Pulm: B/l clear to auscultation.  Effort normal Abd: Soft, BS+. Birth deformity MSK:  Gait WNL.   No TTP. Neuro:    Strength  4+/5 in all UE myotomes  Intention tremor Skin: Warm and Dry. Intact    Assessment & Plan:  84 y/o male with pmh of right shoulder OA, HTN, GERD, DM, right total shoulder arthroplasty 2016 present for follow up for right rib pain.   1. Chronic right costochondral rib pain  Films reviewed, neg for PE  Cont Heat/Cold  Voltaren gel ineffective  Will wean Lidoderm patch, encouraged to resume daily use if pain returns.   Encouraged stretching excercises  Will consider Cymbalta  Will consider TENS unit  Will consider PT in future  Will consider injection in future, would like to reserve intercostal block as a last resort  2. HTN  Relatively controlled today

## 2018-01-16 ENCOUNTER — Ambulatory Visit: Payer: Medicare Other | Admitting: Physical Medicine & Rehabilitation

## 2018-01-25 ENCOUNTER — Other Ambulatory Visit: Payer: Self-pay | Admitting: Adult Health

## 2018-02-16 ENCOUNTER — Other Ambulatory Visit: Payer: Self-pay | Admitting: Family Medicine

## 2018-02-23 DIAGNOSIS — Z85828 Personal history of other malignant neoplasm of skin: Secondary | ICD-10-CM | POA: Diagnosis not present

## 2018-02-23 DIAGNOSIS — L570 Actinic keratosis: Secondary | ICD-10-CM | POA: Diagnosis not present

## 2018-02-23 DIAGNOSIS — L820 Inflamed seborrheic keratosis: Secondary | ICD-10-CM | POA: Diagnosis not present

## 2018-02-24 ENCOUNTER — Telehealth: Payer: Self-pay | Admitting: *Deleted

## 2018-02-24 NOTE — Telephone Encounter (Signed)
Prior authorization submitted and approved for lidocaine patches

## 2018-03-15 ENCOUNTER — Other Ambulatory Visit: Payer: Self-pay | Admitting: Family Medicine

## 2018-03-15 DIAGNOSIS — I10 Essential (primary) hypertension: Principal | ICD-10-CM

## 2018-03-15 DIAGNOSIS — I152 Hypertension secondary to endocrine disorders: Secondary | ICD-10-CM

## 2018-03-15 DIAGNOSIS — E1159 Type 2 diabetes mellitus with other circulatory complications: Secondary | ICD-10-CM

## 2018-03-16 ENCOUNTER — Ambulatory Visit (INDEPENDENT_AMBULATORY_CARE_PROVIDER_SITE_OTHER): Payer: Medicare Other | Admitting: Family Medicine

## 2018-03-16 ENCOUNTER — Encounter: Payer: Self-pay | Admitting: Family Medicine

## 2018-03-16 VITALS — BP 136/75 | HR 55 | Ht 72.0 in | Wt 162.0 lb

## 2018-03-16 DIAGNOSIS — I152 Hypertension secondary to endocrine disorders: Secondary | ICD-10-CM

## 2018-03-16 DIAGNOSIS — E1169 Type 2 diabetes mellitus with other specified complication: Secondary | ICD-10-CM | POA: Diagnosis not present

## 2018-03-16 DIAGNOSIS — R413 Other amnesia: Secondary | ICD-10-CM

## 2018-03-16 DIAGNOSIS — H9190 Unspecified hearing loss, unspecified ear: Secondary | ICD-10-CM | POA: Diagnosis not present

## 2018-03-16 DIAGNOSIS — I10 Essential (primary) hypertension: Secondary | ICD-10-CM

## 2018-03-16 DIAGNOSIS — E782 Mixed hyperlipidemia: Secondary | ICD-10-CM | POA: Diagnosis not present

## 2018-03-16 DIAGNOSIS — E1122 Type 2 diabetes mellitus with diabetic chronic kidney disease: Secondary | ICD-10-CM | POA: Diagnosis not present

## 2018-03-16 DIAGNOSIS — K149 Disease of tongue, unspecified: Secondary | ICD-10-CM | POA: Diagnosis not present

## 2018-03-16 DIAGNOSIS — J029 Acute pharyngitis, unspecified: Secondary | ICD-10-CM | POA: Diagnosis not present

## 2018-03-16 DIAGNOSIS — R5383 Other fatigue: Secondary | ICD-10-CM | POA: Diagnosis not present

## 2018-03-16 DIAGNOSIS — N183 Chronic kidney disease, stage 3 unspecified: Secondary | ICD-10-CM

## 2018-03-16 DIAGNOSIS — E1159 Type 2 diabetes mellitus with other circulatory complications: Secondary | ICD-10-CM

## 2018-03-16 LAB — POCT GLYCOSYLATED HEMOGLOBIN (HGB A1C): Hemoglobin A1C: 6.8

## 2018-03-16 NOTE — Progress Notes (Signed)
Impression and Recommendations:    1. Type 2 diabetes mellitus with stage 3 chronic kidney disease, without long-term current use of insulin ([facility])   2. Hypertension associated with diabetes ([facility])   3. Difficulty hearing, unspecified laterality   4. Mixed diabetic hyperlipidemia associated with type 2 diabetes mellitus (HCC)   5. Stage 3 chronic kidney disease ([facility])   6. Fatigue, unspecified type   7. Memory difficulties     1. Diabetes Mellitus - Last seen 11/17/2017.  A1c has been well controlled. - Last A1c was 6.8 11/17/2017.  6.8 today.  - No changes made today.  Blood sugar is stable at this time.  Well-controlled.  Continue medications as prescribed.  - Advised patient that as an 84 year old, his blood sugar should not drop too low.   - Counseled patient on pathophysiology of disease and discussed various treatment options, which often includes dietary and lifestyle modifications as first line.  Importance of low carb/ketogenic diet discussed with patient in addition to regular exercise.   - Continue checking FBS and 2 hours after the biggest meal of your day.  Keep log and bring in next OV for my review.   Also, if you ever feel poorly, please check your blood pressure and blood sugar, as one or the other could be the cause of your symptoms.  - Being a diabetic, you need yearly eye and foot exams. Make appt.for diabetic eye exam.   Renal Health - Reviewed that exercise, adequate hydration, and adequate control of his blood sugar help to preserve kidney health.  Discussed the critical importance of preserving his kidney health as he ages. -Serum creatinine has remained stable.  We will continue to monitor.   2. Blood Pressure -  No changes made today.  Stable and well-controlled.  - Advised patient to stand and allow his body to equilibrate before he starts to move, to avoid low BP events.  - Blood pressure well-controlled at this time.  Continue current  medication(s).  - Lifestyle changes such as engaging in a regular exercise program discussed with patient.   - Continued ambulatory BP monitoring encouraged. Keep log and bring in next OV  - Contact us prior with any Q's/ concerns.  3. Dermatology - Continue to see Dr. Jarome Matin to manage scalp concerns, and for yearly skin screening..  4. ENT Concerns - Various Providers - Continue to follow-up with ENT until area in mouth is evaluated. - Asked patient to request that ENT providers/ OMFS etc consulted should send notes to [place] here at the clinic after he is seen.  5. Ambulatory Referral to Audiology - Needs assessment of hearing and is likely candidate for hearing aids.  6. General Health Maintenance - Advised patient to continue working toward exercising to improve health.    - Patient may strive for 15 minutes of activity daily.  Recommended that the patient eventually strive for at least 150 minutes of cardio per week according to the [facility].   - Healthy dietary habits encouraged, including low-carb, and high amounts of lean protein in diet.   - Patient should also consume adequate amounts of water - half of body weight in oz of water per day.  7. Follow-Up - Patient will have several labs drawn in the near future at his convenience. - If patient wishes for follow-up appointment to discuss labs, he knows to call. - Return in 4 months for OV, diabetes and chronic concerns.   Education and routine  counseling performed. Handouts provided.   Orders Placed This Encounter  Procedures  . Lipid panel  . VITAMIN D 25 Hydroxy (Vit-D Deficiency, Fractures)  . TSH  . T4, free  . T3, free  . Comprehensive metabolic panel  . Vitamin B12  . Ambulatory referral to Audiology  . POCT glycosylated hemoglobin (Hb A1C)    Return for 62-month follow-up diabetes, hypertension, hyperlipidemia.   The patient was counseled, risk factors were discussed, anticipatory guidance given.  Gross  side effects, risk and benefits, and alternatives of medications discussed with patient.  Patient is aware that all medications have potential side effects and we are unable to predict every side effect or drug-drug interaction that may occur.  Expresses verbal understanding and consents to current therapy plan and treatment regimen.  Please see AVS handed out to patient at the end of our visit for further patient instructions/ counseling done pertaining to today's office visit.    Note: This document was prepared using Dragon voice recognition software and may include unintentional dictation errors.  This document serves as a record of services personally performed by Mellody Dance, DO. It was created on her behalf by Toni Amend, a trained medical scribe. The creation of this record is based on the scribe's personal observations and the provider's statements to them.   I have reviewed the above medical documentation for accuracy and completeness and I concur.  Mellody Dance 03/16/18 4:54 PM   Subjective:    Chief Complaint  Patient presents with  . Follow-up    [facility] is a 84 y.o. male who [facility] for [facility].    Patient notes that at times he feels fatigued, but denies aches in his joints.  Notes that his memory is not what it used to be, and he has trouble recalling names at times.  ENT Concerns Patient notes that overall, he's been good.  Has an appointment at 1 o'clock this afternoon with ENT.  Has a place on the base of his tongue that's bothering him.  Notes that he's seen two ENT's already, an oral surgeon, and the dentist.  "They all say they can't see anything and can't do anything, but it's still bothering me."   Activity Level Continues driving.  Doesn't have any concerns associated with driving.  Enjoy driving and denies feeling nervous or worried about it.  Has been working out in the  yard, keeping busy.  Patient has been having some issues communicating with his wife at home, especially since she speaks so softly, and was asked today if he would like a hearing screening.  Dermatology Has itchy places on his scalp that he is treating with hydrocortisone cream, under the supervision of Dr. Jarome Matin of [facility].  HTN HPI:  -  His blood pressure has been controlled at home.  Pt is checking it at home.  Notes that this morning it was 130-something.  - Patient reports good compliance with blood pressure medications  - Denies medication S-E   - Smoking Status noted   - He denies new onset of: chest pain, exercise intolerance, shortness of breath, dizziness, visual changes, headache, lower extremity swelling or claudication.   Last 3 blood pressure readings in our office are as follows: BP Readings from Last 3 Encounters:  03/16/18 136/75  01/15/18 (!) 142/72  11/17/17 128/73    Filed Weights   03/16/18 0943  Weight: 162 lb (73.5 kg)   DM  HPI: -  He has been working on diet and exercise for diabetes.  His diabetes is well controlled with stable A1c at 6.8.  Pt is currently maintained on the following medications for diabetes:   see med list today Medication compliance - patient is compliant with his medications.  Home glucose readings range:   Says that 130's is the highest, and it goes down into the 90's. He checks his blood sugar every day.  100's and 120's are his average.   Denies polyuria/polydipsia. Denies hypo/ hyperglycemia symptoms - He denies new onset of: chest pain, exercise intolerance, shortness of breath, dizziness, visual changes, headache, lower extremity swelling or claudication.   Last diabetic eye exam was  Lab Results  Component Value Date   HMDIABEYEEXA No Retinopathy 04/10/2017    Foot exam- UTD  Last A1C in the office was:  Lab Results  Component Value Date   HGBA1C 6.8 03/16/2018   HGBA1C 6.8 (H) 11/17/2017     HGBA1C 6.7 07/15/2017    Lab Results  Component Value Date   MICROALBUR 10 07/15/2017   LDLCALC 42 02/18/2017   CREATININE 1.37 (H) 11/17/2017    Last 3 blood pressure readings in our office are as follows: BP Readings from Last 3 Encounters:  03/16/18 136/75  01/15/18 (!) 142/72  11/17/17 128/73    BMI Readings from Last 3 Encounters:  03/16/18 21.97 kg/m  11/17/17 22.36 kg/m  09/03/17 22.20 kg/m     No problems updated.    Patient Care Team    Relationship Specialty Notifications Start End  Mellody Dance, DO PCP - General Family Medicine  09/01/17   Jerrell Belfast, MD Consulting Physician Otolaryngology  03/16/18   Jarome Matin, MD Consulting Physician Dermatology  03/16/18      Patient Active Problem List   Diagnosis Date Noted  . Type 2 diabetes mellitus with stage 3 chronic kidney disease, without long-term current use of insulin ([place]) 05/05/2017    Priority: High  . Bilateral lower extremity edema 11/17/2017  . Hypertension associated with diabetes ([place]) 08/05/2017  . Mixed diabetic hyperlipidemia associated with type 2 diabetes mellitus ([place]) 08/05/2017  . Microalbuminuria due to type 2 diabetes mellitus ([place]) 08/05/2017  . RLS (restless legs syndrome) 06/17/2017  . GERD (gastroesophageal reflux disease) 05/05/2017  . Glossodynia 05/05/2017  . Costochondritis 05/05/2017  . CKD (chronic kidney disease) 04/22/2017  . Abdominal pain 03/25/2017  . Hypertension 03/25/2017  . CAD (coronary artery disease) 03/25/2017  . Diabetes mellitus without complication ([place]) 68/09/5725  . Snoring 03/25/2017  . Urinary retention due to benign prostatic hyperplasia 01/25/2015  . Primary osteoarthritis of right shoulder 01/24/2015  . Edema, peripheral 11/22/2014  . Benign essential hypertension 11/22/2014  . Pure hypercholesterolemia 11/22/2014     Past Medical History:  Diagnosis Date  . [facility])    SKIN CANCER   . Cataract   . Diabetes mellitus  without complication ([place])   . Elbow effusion, left   . Frequency of urination   . GERD (gastroesophageal reflux disease)   . Heart attack ([place])   . Heart disease   . Hypertension   . Primary osteoarthritis of right shoulder 01/24/2015  . Prostate enlargement   . Tremors of nervous system   . Urinary retention due to benign prostatic hyperplasia 01/25/2015     Past Surgical History:  Procedure Laterality Date  . CARDIAC CATHETERIZATION     1999  . cardio stint    . HERNIA REPAIR  BILAT   . MOHS SURGERY     BILAT EARS   . PROSTATE BIOPSY    . TOTAL SHOULDER ARTHROPLASTY Right 01/24/2015   Procedure: RIGHT TOTAL SHOULDER ARTHROPLASTY;  Surgeon: Marchia Bond, MD;  Location: [facility];  Service: Orthopedics;  Laterality: Right;     Family History  Problem Relation Age of Onset  . Hypertension Brother      Social History   Substance and Sexual Activity  Drug Use No  ,  Social History   Substance and Sexual Activity  Alcohol Use No  ,  Social History   Tobacco Use  Smoking Status Never Smoker  Smokeless Tobacco Never Used  ,    Current Outpatient Medications on File Prior to Visit  Medication Sig Dispense Refill  . aspirin 81 MG tablet Take 81 mg by mouth daily.    Marland Kitchen atorvastatin (LIPITOR) 10 MG tablet Take 1-1.5 tablets (10-15 mg total) by mouth daily. Mon Wed Fri 15 mg Sun Tu Th Sa 10 mg 90 tablet 1  . Cobalamine Combinations (VITAMIN B12-FOLIC ACID) 213-086 MCG TABS Take 1 tablet by mouth daily.    Marland Kitchen glimepiride (AMARYL) 1 MG tablet TAKE 1 TABLET DAILY WITH BREAKFAST 90 tablet 0  . hydrochlorothiazide (HYDRODIURIL) 25 MG tablet TAKE 1 TABLET DAILY 90 tablet 0  . hydrocortisone 2.5 % ointment   1  . losartan (COZAAR) 25 MG tablet Take 1 tablet (25 mg total) by mouth daily. 90 tablet 1  . metFORMIN (GLUCOPHAGE) 500 MG tablet 1 tablet in the morning, 1 tablet in the evening 180 tablet 1  . metoprolol succinate (TOPROL-XL) 25 MG 24 hr tablet TAKE 1 TABLET DAILY 90  tablet 1  . Polyethyl Glycol-Propyl Glycol (SYSTANE OP) Apply 1 drop to eye 3 (three) times daily as needed (Dry eyes).    . polyethylene glycol (MIRALAX / GLYCOLAX) packet Take 17 g by mouth daily as needed.     No current facility-administered medications on file prior to visit.      Allergies  Allergen Reactions  . Exenatide     nausea  . Meperidine And Related   . Nadolol     Numbness in fingers  . Sitagliptin Phosphate [Sitagliptin]     Weight loss     Review of Systems:   General:  Denies fever, chills Optho/Auditory:   Denies visual changes, blurred vision Respiratory:   Denies SOB, cough, wheeze, DIB  Cardiovascular:   Denies chest pain, palpitations, painful respirations Gastrointestinal:   Denies nausea, vomiting, diarrhea.  Endocrine:     Denies new hot or cold intolerance Musculoskeletal:  Denies joint swelling, gait issues, or new unexplained myalgias/ arthralgias Skin:  Denies rash, suspicious lesions  Neurological:    Denies dizziness, unexplained weakness, numbness  Psychiatric/Behavioral:   Denies mood changes   Objective:     Blood pressure 136/75, pulse (!) 55, height 6' (1.829 m), weight 162 lb (73.5 kg), SpO2 97 %.  Body mass index is 21.97 kg/m.  General: Well Developed, well nourished, and in no acute distress.  HEENT: Normocephalic, atraumatic, pupils equal round reactive to light, neck supple, No carotid bruits, no JVD Skin: Warm and dry, cap RF less 2 sec Cardiac: Regular rate and rhythm, S1, S2 WNL's, no murmurs rubs or gallops Respiratory: ECTA B/L, Not using accessory muscles, speaking in full sentences. NeuroM-Sk: Ambulates w/o assistance, moves ext * 4 w/o difficulty, sensation grossly intact.  Ext: scant edema b/l lower ext Psych: No HI/SI, judgement and insight good,  Euthymic mood. Full Affect.

## 2018-03-16 NOTE — Patient Instructions (Signed)
Continue to drink adequate amounts of water which is roughly 1/2 of your weight in ounces of water per day and continue to control your blood sugar and blood pressures.  You are doing a great job monitoring so please keep it up.  -In the near future you will come in at your convenience and make an appointment for fasting blood work.  This will be a lab only visit.  If you would like you can make a follow-up appointment to discuss those labs with me directly or I can have my assistant Melissa just call you with the results which ever works best for you.  Otherwise I will see you in 4 months

## 2018-03-17 ENCOUNTER — Other Ambulatory Visit: Payer: Medicare Other

## 2018-03-17 DIAGNOSIS — E782 Mixed hyperlipidemia: Secondary | ICD-10-CM

## 2018-03-17 DIAGNOSIS — I152 Hypertension secondary to endocrine disorders: Secondary | ICD-10-CM

## 2018-03-17 DIAGNOSIS — R5383 Other fatigue: Secondary | ICD-10-CM | POA: Diagnosis not present

## 2018-03-17 DIAGNOSIS — E1122 Type 2 diabetes mellitus with diabetic chronic kidney disease: Secondary | ICD-10-CM | POA: Diagnosis not present

## 2018-03-17 DIAGNOSIS — N183 Chronic kidney disease, stage 3 unspecified: Secondary | ICD-10-CM

## 2018-03-17 DIAGNOSIS — E1159 Type 2 diabetes mellitus with other circulatory complications: Secondary | ICD-10-CM | POA: Diagnosis not present

## 2018-03-17 DIAGNOSIS — E1169 Type 2 diabetes mellitus with other specified complication: Secondary | ICD-10-CM | POA: Diagnosis not present

## 2018-03-17 DIAGNOSIS — R413 Other amnesia: Secondary | ICD-10-CM

## 2018-03-17 DIAGNOSIS — I10 Essential (primary) hypertension: Secondary | ICD-10-CM

## 2018-03-18 LAB — COMPREHENSIVE METABOLIC PANEL
A/G RATIO: 2.2 (ref 1.2–2.2)
ALK PHOS: 52 IU/L (ref 39–117)
ALT: 13 IU/L (ref 0–44)
AST: 22 IU/L (ref 0–40)
Albumin: 4.2 g/dL (ref 3.5–4.7)
BUN/Creatinine Ratio: 22 (ref 10–24)
BUN: 30 mg/dL — ABNORMAL HIGH (ref 8–27)
Bilirubin Total: 0.6 mg/dL (ref 0.0–1.2)
CALCIUM: 9.3 mg/dL (ref 8.6–10.2)
CO2: 26 mmol/L (ref 20–29)
Chloride: 103 mmol/L (ref 96–106)
Creatinine, Ser: 1.35 mg/dL — ABNORMAL HIGH (ref 0.76–1.27)
GFR calc Af Amer: 56 mL/min/{1.73_m2} — ABNORMAL LOW (ref >59)
GFR calc non Af Amer: 48 mL/min/{1.73_m2} — ABNORMAL LOW (ref >59)
Globulin, Total: 1.9 g/dL (ref 1.5–4.5)
Glucose: 126 mg/dL — ABNORMAL HIGH (ref 65–99)
POTASSIUM: 4.2 mmol/L (ref 3.5–5.2)
Sodium: 143 mmol/L (ref 134–144)
Total Protein: 6.1 g/dL (ref 6.0–8.5)

## 2018-03-18 LAB — LIPID PANEL
CHOL/HDL RATIO: 2.1 ratio (ref 0.0–5.0)
Cholesterol, Total: 105 mg/dL (ref 100–199)
HDL: 49 mg/dL (ref >39)
LDL Calculated: 46 mg/dL (ref 0–99)
Triglycerides: 52 mg/dL (ref 0–149)
VLDL Cholesterol Cal: 10 mg/dL (ref 5–40)

## 2018-03-18 LAB — VITAMIN B12: VITAMIN B 12: 1154 pg/mL (ref 232–1245)

## 2018-03-18 LAB — TSH: TSH: 1.79 u[IU]/mL (ref 0.450–4.500)

## 2018-03-18 LAB — VITAMIN D 25 HYDROXY (VIT D DEFICIENCY, FRACTURES): Vit D, 25-Hydroxy: 42.6 ng/mL (ref 30.0–100.0)

## 2018-03-18 LAB — T4, FREE: FREE T4: 1.17 ng/dL (ref 0.82–1.77)

## 2018-03-18 LAB — T3, FREE: T3 FREE: 2.7 pg/mL (ref 2.0–4.4)

## 2018-03-21 ENCOUNTER — Other Ambulatory Visit: Payer: Self-pay | Admitting: Family Medicine

## 2018-03-22 ENCOUNTER — Other Ambulatory Visit: Payer: Self-pay | Admitting: Family Medicine

## 2018-03-22 DIAGNOSIS — I10 Essential (primary) hypertension: Secondary | ICD-10-CM

## 2018-03-22 DIAGNOSIS — N183 Chronic kidney disease, stage 3 unspecified: Secondary | ICD-10-CM

## 2018-03-22 DIAGNOSIS — R809 Proteinuria, unspecified: Secondary | ICD-10-CM

## 2018-03-22 DIAGNOSIS — E1122 Type 2 diabetes mellitus with diabetic chronic kidney disease: Secondary | ICD-10-CM

## 2018-03-22 DIAGNOSIS — E1159 Type 2 diabetes mellitus with other circulatory complications: Secondary | ICD-10-CM

## 2018-03-22 DIAGNOSIS — E1129 Type 2 diabetes mellitus with other diabetic kidney complication: Secondary | ICD-10-CM

## 2018-03-24 ENCOUNTER — Other Ambulatory Visit: Payer: Self-pay | Admitting: Otolaryngology

## 2018-03-24 DIAGNOSIS — K149 Disease of tongue, unspecified: Secondary | ICD-10-CM

## 2018-03-24 DIAGNOSIS — J029 Acute pharyngitis, unspecified: Secondary | ICD-10-CM

## 2018-03-25 ENCOUNTER — Encounter: Payer: Self-pay | Admitting: Family Medicine

## 2018-03-25 DIAGNOSIS — H903 Sensorineural hearing loss, bilateral: Secondary | ICD-10-CM | POA: Diagnosis not present

## 2018-03-25 DIAGNOSIS — Z570 Occupational exposure to noise: Secondary | ICD-10-CM | POA: Diagnosis not present

## 2018-03-28 ENCOUNTER — Ambulatory Visit
Admission: RE | Admit: 2018-03-28 | Discharge: 2018-03-28 | Disposition: A | Discharge status: Home / Self Care | Payer: Medicare Other | Source: Ambulatory Visit | Attending: Otolaryngology | Admitting: Otolaryngology

## 2018-03-28 DIAGNOSIS — J029 Acute pharyngitis, unspecified: Secondary | ICD-10-CM

## 2018-03-28 DIAGNOSIS — K149 Disease of tongue, unspecified: Secondary | ICD-10-CM

## 2018-03-28 DIAGNOSIS — K146 Glossodynia: Secondary | ICD-10-CM | POA: Diagnosis not present

## 2018-03-28 MED ORDER — IOPAMIDOL (ISOVUE-300) INJECTION 61%
75.0000 mL | Freq: Once | INTRAVENOUS | Status: AC | PRN
  Administered 2018-03-28: 75 mL via INTRAVENOUS

## 2018-03-31 DIAGNOSIS — J029 Acute pharyngitis, unspecified: Secondary | ICD-10-CM | POA: Diagnosis not present

## 2018-03-31 DIAGNOSIS — K149 Disease of tongue, unspecified: Secondary | ICD-10-CM | POA: Diagnosis not present

## 2018-04-16 DIAGNOSIS — Z961 Presence of intraocular lens: Secondary | ICD-10-CM | POA: Diagnosis not present

## 2018-04-16 DIAGNOSIS — E119 Type 2 diabetes mellitus without complications: Secondary | ICD-10-CM | POA: Diagnosis not present

## 2018-04-16 DIAGNOSIS — H52203 Unspecified astigmatism, bilateral: Secondary | ICD-10-CM | POA: Diagnosis not present

## 2018-04-16 DIAGNOSIS — H04123 Dry eye syndrome of bilateral lacrimal glands: Secondary | ICD-10-CM | POA: Diagnosis not present

## 2018-04-16 LAB — HM DIABETES EYE EXAM

## 2018-04-17 DIAGNOSIS — L570 Actinic keratosis: Secondary | ICD-10-CM | POA: Diagnosis not present

## 2018-04-17 DIAGNOSIS — Z85828 Personal history of other malignant neoplasm of skin: Secondary | ICD-10-CM | POA: Diagnosis not present

## 2018-04-17 DIAGNOSIS — L821 Other seborrheic keratosis: Secondary | ICD-10-CM | POA: Diagnosis not present

## 2018-04-26 ENCOUNTER — Other Ambulatory Visit: Payer: Self-pay | Admitting: Family Medicine

## 2018-05-05 DIAGNOSIS — E785 Hyperlipidemia, unspecified: Secondary | ICD-10-CM | POA: Diagnosis not present

## 2018-05-05 DIAGNOSIS — I252 Old myocardial infarction: Secondary | ICD-10-CM | POA: Diagnosis not present

## 2018-05-05 DIAGNOSIS — R0789 Other chest pain: Secondary | ICD-10-CM | POA: Diagnosis not present

## 2018-05-05 DIAGNOSIS — E119 Type 2 diabetes mellitus without complications: Secondary | ICD-10-CM | POA: Diagnosis not present

## 2018-05-05 DIAGNOSIS — I2510 Atherosclerotic heart disease of native coronary artery without angina pectoris: Secondary | ICD-10-CM | POA: Diagnosis not present

## 2018-05-05 DIAGNOSIS — I119 Hypertensive heart disease without heart failure: Secondary | ICD-10-CM | POA: Diagnosis not present

## 2018-05-16 ENCOUNTER — Other Ambulatory Visit: Payer: Self-pay | Admitting: Family Medicine

## 2018-05-20 ENCOUNTER — Other Ambulatory Visit: Payer: Self-pay | Admitting: Family Medicine

## 2018-05-25 DIAGNOSIS — L239 Allergic contact dermatitis, unspecified cause: Secondary | ICD-10-CM | POA: Diagnosis not present

## 2018-05-25 DIAGNOSIS — Z85828 Personal history of other malignant neoplasm of skin: Secondary | ICD-10-CM | POA: Diagnosis not present

## 2018-05-25 DIAGNOSIS — D485 Neoplasm of uncertain behavior of skin: Secondary | ICD-10-CM | POA: Diagnosis not present

## 2018-05-25 DIAGNOSIS — L570 Actinic keratosis: Secondary | ICD-10-CM | POA: Diagnosis not present

## 2018-05-26 ENCOUNTER — Other Ambulatory Visit: Payer: Self-pay | Admitting: Gastroenterology

## 2018-05-26 DIAGNOSIS — K862 Cyst of pancreas: Secondary | ICD-10-CM

## 2018-07-09 ENCOUNTER — Telehealth: Payer: Self-pay | Admitting: Family Medicine

## 2018-07-09 ENCOUNTER — Other Ambulatory Visit: Payer: Self-pay

## 2018-07-09 DIAGNOSIS — E782 Mixed hyperlipidemia: Principal | ICD-10-CM

## 2018-07-09 DIAGNOSIS — L308 Other specified dermatitis: Secondary | ICD-10-CM | POA: Diagnosis not present

## 2018-07-09 DIAGNOSIS — E1169 Type 2 diabetes mellitus with other specified complication: Secondary | ICD-10-CM

## 2018-07-09 DIAGNOSIS — L570 Actinic keratosis: Secondary | ICD-10-CM | POA: Diagnosis not present

## 2018-07-09 DIAGNOSIS — Z85828 Personal history of other malignant neoplasm of skin: Secondary | ICD-10-CM | POA: Diagnosis not present

## 2018-07-09 MED ORDER — ATORVASTATIN CALCIUM 10 MG PO TABS: 10.0000 mg | ORAL_TABLET | Freq: Every day | ORAL | 1 refills | Status: DC

## 2018-07-09 NOTE — Telephone Encounter (Signed)
Patient called to request Rx refill on :  atorvastatin (LIPITOR) 10 MG tablet [[id number]]   Order Details  Dose: 10-15 mg Route: Oral Frequency: Daily  Dispense Quantity: 90 tablet Refills: 1 Fills remaining: --        Sig:    Take 1-1.5 tablets (10-15 mg total) by mouth daily. Mon Wed Fri 15 mg  Sun Tu Th Sa 10 mg       Please send refill order to:  [facility] [place] - [address] [phone] (Phone) [phone] (Fax)   --Forwarding request to medical assistant.  --Dion Body

## 2018-07-09 NOTE — Telephone Encounter (Signed)
Printed and faxed. MPulliam, CMA/RT(R)

## 2018-07-10 ENCOUNTER — Encounter: Payer: Medicare Other | Admitting: Physical Medicine & Rehabilitation

## 2018-07-23 ENCOUNTER — Ambulatory Visit: Payer: Medicare Other | Admitting: Family Medicine

## 2018-07-25 ENCOUNTER — Other Ambulatory Visit: Payer: Self-pay | Admitting: Family Medicine

## 2018-07-27 ENCOUNTER — Encounter: Payer: Self-pay | Admitting: Family Medicine

## 2018-07-27 ENCOUNTER — Ambulatory Visit (INDEPENDENT_AMBULATORY_CARE_PROVIDER_SITE_OTHER): Payer: Medicare Other | Admitting: Family Medicine

## 2018-07-27 VITALS — BP 123/73 | HR 54 | Ht 72.0 in | Wt 159.4 lb

## 2018-07-27 DIAGNOSIS — E1169 Type 2 diabetes mellitus with other specified complication: Secondary | ICD-10-CM | POA: Diagnosis not present

## 2018-07-27 DIAGNOSIS — E1122 Type 2 diabetes mellitus with diabetic chronic kidney disease: Secondary | ICD-10-CM | POA: Diagnosis not present

## 2018-07-27 DIAGNOSIS — N183 Chronic kidney disease, stage 3 unspecified: Secondary | ICD-10-CM

## 2018-07-27 DIAGNOSIS — L739 Follicular disorder, unspecified: Secondary | ICD-10-CM

## 2018-07-27 DIAGNOSIS — C801 Malignant (primary) neoplasm, unspecified: Secondary | ICD-10-CM | POA: Diagnosis not present

## 2018-07-27 DIAGNOSIS — I10 Essential (primary) hypertension: Secondary | ICD-10-CM | POA: Diagnosis not present

## 2018-07-27 DIAGNOSIS — E1129 Type 2 diabetes mellitus with other diabetic kidney complication: Secondary | ICD-10-CM

## 2018-07-27 DIAGNOSIS — R809 Proteinuria, unspecified: Secondary | ICD-10-CM

## 2018-07-27 DIAGNOSIS — I152 Hypertension secondary to endocrine disorders: Secondary | ICD-10-CM

## 2018-07-27 DIAGNOSIS — E1159 Type 2 diabetes mellitus with other circulatory complications: Secondary | ICD-10-CM

## 2018-07-27 DIAGNOSIS — E782 Mixed hyperlipidemia: Secondary | ICD-10-CM

## 2018-07-27 HISTORY — DX: Follicular disorder, unspecified: L73.9

## 2018-07-27 LAB — POCT GLYCOSYLATED HEMOGLOBIN (HGB A1C): Hemoglobin A1C: 7 % — AB (ref 4.0–5.6)

## 2018-07-27 MED ORDER — ATORVASTATIN CALCIUM 10 MG PO TABS: 10.0000 mg | ORAL_TABLET | Freq: Every day | ORAL | 1 refills | Status: DC

## 2018-07-27 NOTE — Progress Notes (Signed)
Assessment and Plan:  1. Type 2 diabetes mellitus with stage 3 chronic kidney disease, without long-term current use of insulin (HCC)   2. Stage 3 chronic kidney disease ([place])   3. Microalbuminuria due to type 2 diabetes mellitus ([place])   4. Hypertension associated with diabetes ([place])   5. Mixed diabetic hyperlipidemia associated with type 2 diabetes mellitus ([place])   6. Cancer ([place])   7. Folliculitis- scalp; txed by Derm- Dr Jarome Matin     Diabetes:    -Instructed to return for microalbumin testing in near future since he could not urinate today -A1C at goal and next testing in 4 month; cont meds -Encouraged pt to increase level of daily exercise and discussed its impact on minimallizing diabetes progression   Stage III CKD - Check microalbumin testing near future -Discussed the impact of blood sugar  and bld pressure on kidney function and progression of kidney disease   Hypertension:  -Discussed need for maintaining well controlled bp due to history of MI and CAD -discussed possible side effect of bp medications impacting dizziness upon standing- esp B-Bl but added by Cards due to his CAD/ AMI.  Sx are not bothersome nor to the impact his quality of life and ability to do things.  -Encouraged pt to increase his water intake to adequate amounts per day to help prevent dip in blood pressure with standing -Discussed half his weight in ounces for water/day intake to improve hydration -Discussed changed response to changing in position with age due to dec sensitivity of baroreceptors -Instructed pt to take caution when standing especially after sleep by bracing on solid surfaces etc -Instructed pt to contact us if dizziness begins to negatively impact lifstyle    Cholesterol:  -LDL at goal; continue medications as prescribed; tol well -Encouraged pt to increase exercise and its positive impacts on maintaining healthy cholesterol levels    Folliculitis -Encouraged pt to  consider wearing a wide brimmed SPF 100 hat for skin protection during outdoor activity -Discussed possibility of seeing a Derm Specialist for further care if this medication doesn't resolve folliculitis-  encouraged  patient to speak with his dermatologist about this  Education and routine counseling performed. Handouts provided.   Orders Placed This Encounter  Procedures  . Microalbumin / creatinine urine ratio  . POCT glycosylated hemoglobin (Hb A1C)    Meds ordered this encounter  Medications  . atorvastatin (LIPITOR) 10 MG tablet    Sig: Take 1-1.5 tablets (10-15 mg total) by mouth daily. Mon Wed Fri 15 mg Sun Tu Th Sa 10 mg    Dispense:  90 tablet    Refill:  1     Medications Discontinued During This Encounter  Medication Reason  . hydrocortisone 2.5 % ointment Change in therapy  . Cobalamine Combinations (VITAMIN B12-FOLIC ACID) 852-778 MCG TABS Patient Preference  . atorvastatin (LIPITOR) 10 MG tablet Reorder      -Gross side effects, risk and benefits, and alternatives of medications discussed with patient.  Patient is aware that all medications have potential side effects and we are unable to predict every side effect or drug-drug interaction that may occur.  Expresses verbal understanding and consents to current therapy plan and treatment regimen.    Follow Up:  Return for f/up 3-4 mo for DM, BP, chol etc.   Please see AVS handed out to patient at the end of our visit for further patient instructions/ counseling done pertaining to today's office visit.  Note:  This document was prepared using Dragon voice recognition software and may include unintentional dictation errors.  This document serves as a record of services personally performed by Mellody Dance, MD. It was created on her behalf by Georga Bora, a trained medical scribe. The creation of this record is based on the scribe's personal observations and the provider's statements to them.   I have  reviewed the above medical documentation for accuracy and completeness and I concur.  Mellody Dance 07/27/18 12:00 PM   ----------------------------------------------------------------------------------------------------------------    Subjective:  HPI: George Barnett 84 y.o. male  presents for 3 month follow up for multiple medical problems.   HPI:  George Barnett y.o. male presents for 3 month follow up for multiple medical problems.  Folliculitis -States he has been prescribed 30 days of doxycycline for folliculitis  -Has tried several creams including prednisone without success -Has some concerns about improvement of his folliculitis  Diabetes  Pt reports compliance with medications and/ or treatment plan Denies S-E   Home fasting glucose readings range -  Below 100, in the 90s since beginning doxycyline -States sugar is normally in the 110's -Has not been experiencing large highs or lows -States doxycycline has been decreasing his appetite so they are lower than normal  Denies polyuria/polydipsia. Denies hypoglycemia symptoms  A1C up to 7.0 today from 6.8 in April 2019  Last diabetic eye exam was  Lab Results  Component Value Date   HMDIABEYEEXA No Retinopathy 04/16/2018    Foot exam-  UTD  Lab Results  Component Value Date   HGBA1C 7.0 (A) 07/27/2018   HGBA1C 6.8 03/16/2018   HGBA1C 6.8 (H) 11/17/2017    Lab Results  Component Value Date   MICROALBUR 10 07/15/2017   LDLCALC 46 03/17/2018   CREATININE 1.35 (H) 03/17/2018    Kidney disease -Sees Dr. Guadalupe Maple for urology -Has concerns about frequent night urination with increasing water intake  -Ser/Creatinine high but stable; 1.35 in April 2019 and 1.37 in Dec 2018 -GFR rate high but stable at 48 in both April 2019 and Dec 2018    Hyperlipidemia  Pt states he has been taking his cholesterol medications every night Denies S-E  -Pt denies RUQ pain and muscle aches  Patient reports poor  compliance with low fat/low cholesterol diet and healthier lifestyle  modifications.   Last lipid panel as follows:  Lab Results  Component Value Date   CHOL 105 03/17/2018   HDL 49 03/17/2018   LDLCALC 46 03/17/2018   TRIG 52 03/17/2018   CHOLHDL 2.1 03/17/2018   Cardiology -Dr. Tollie Eth is his current cardiologist       Hypertension Pt reports compliance with medications and/ or treatment plan Denies S-E. -Has experienced some dizziness upon standing, but it resides if he stands still for a moment  Home Blood pressure range-  120s/70s  Low salt diet-    yes Smoking Status noted  Patient denies new onset of sx- no chest pain, dizziness, HA, DIB/ shortness of breath  or swelling.  -Some chronic  LE swelling; stable   Lab Results  Component Value Date   CREATININE 1.35 (H) 03/17/2018    Last 3 blood pressure readings in our office are as follows: BP Readings from Last 3 Encounters:  07/27/18 123/73  03/16/18 136/75  01/15/18 (!) 142/72    The CVD Risk score (D'Agostino, et al., 2008) failed to calculate for the following reasons:   CVD risk score not calculated  Wt Readings from Last 3 Encounters:  07/27/18 159 lb 6.4 oz (72.3 kg)  03/16/18 162 lb (73.5 kg)  11/17/17 164 lb 14.4 oz (74.8 kg)   BMI Readings from Last 3 Encounters:  07/27/18 21.62 kg/m  03/16/18 21.97 kg/m  11/17/17 22.36 kg/m      Patient Care Team    Relationship Specialty Notifications Start End  Mellody Dance, DO PCP - General Family Medicine  09/01/17   Jerrell Belfast, MD Consulting Physician Otolaryngology  03/16/18   Jarome Matin, MD Consulting Physician Dermatology  03/16/18   Jacolyn Reedy, MD Consulting Physician Cardiology  07/27/18   Franchot Gallo, MD Consulting Physician Urology  07/27/18   Jamse Arn, MD Consulting Physician Physical Medicine and Rehabilitation  07/27/18   Deneise Lever, MD Consulting Physician Pulmonary Disease  07/27/18       Review of Systems: General:   Denies fever, chills, unexplained weight loss.  Optho/Auditory:   Denies visual changes, blurred vision/LOV Respiratory:   Denies SOB, DOE more than baseline levels.   Cardiovascular:   Denies chest pain, palpitations, new onset peripheral edema  Gastrointestinal:   Denies nausea, vomiting, diarrhea.  Genitourinary: Denies dysuria, freq/ urgency, flank pain or discharge from genitals.  Endocrine:     Denies hot or cold intolerance, polyuria, polydipsia. Musculoskeletal:   Denies unexplained myalgias, joint swelling, unexplained arthralgias, gait problems.  Skin:  Denies rash, suspicious lesions Neurological:     Denies dizziness, unexplained weakness, numbness  Psychiatric/Behavioral:   Denies mood changes, suicidal or homicidal ideations, hallucinations    Objective: Physical Exam: BP 123/73   Pulse (!) 54   Ht 6' (1.829 m)   Wt 159 lb 6.4 oz (72.3 kg)   SpO2 100%   BMI 21.62 kg/m  Body mass index is 21.62 kg/m. General: Well nourished, in no apparent distress. Eyes: PERRLA, EOMs, conjunctiva clr no swelling or erythema ENT/Mouth: Hearing appears normal.  Mucus Membranes Moist  Neck: Supple, no masses, no carotid bruits b/l Resp: Respiratory effort- normal, ECTA B/L w/o W/R/R  Cardio: RRR w/o MRGs. Abdomen: no gross distention. Lymphatics:  Brisk peripheral pulses, less 2 sec cap RF, no gross edema  M-sk: Full ROM, 5/5 strength, normal gait.  Skin: Warm, dry without rashes, lesions, ecchymosis.  Neuro: Alert, Oriented Psych: Normal affect, Insight and Judgment appropriate.    Current Medications:  Current Outpatient Medications on File Prior to Visit  Medication Sig Dispense Refill  . aspirin 81 MG tablet Take 81 mg by mouth daily.    . clobetasol cream (TEMOVATE) 6.46 % Apply 1 application topically 2 (two) times daily.    . Cyanocobalamin (VITAMIN B-12) 500 MCG LOZG Take 1 lozenge by mouth daily.    Marland Kitchen doxycycline (DORYX) 100 MG EC  tablet Take 100 mg by mouth 2 (two) times daily.    Marland Kitchen glimepiride (AMARYL) 1 MG tablet TAKE 1 TABLET DAILY WITH BREAKFAST 90 tablet 0  . losartan (COZAAR) 25 MG tablet TAKE 1 TABLET DAILY 90 tablet 1  . metFORMIN (GLUCOPHAGE) 500 MG tablet TAKE 1 TABLET IN THE MORNING AND 1 TABLET IN THE EVENING 180 tablet 1  . metoprolol succinate (TOPROL-XL) 25 MG 24 hr tablet TAKE 1 TABLET DAILY 90 tablet 1  . Polyethyl Glycol-Propyl Glycol (SYSTANE OP) Apply 1 drop to eye 3 (three) times daily as needed (Dry eyes).    . polyethylene glycol (MIRALAX / GLYCOLAX) packet Take 17 g by mouth daily as needed.    . hydrochlorothiazide (HYDRODIURIL) 25  MG tablet TAKE 1 TABLET DAILY 90 tablet 4   No current facility-administered medications on file prior to visit.     Medical History:  Patient Active Problem List   Diagnosis Date Noted  . Hypertension associated with diabetes ([place]) 08/05/2017    Priority: High  . Mixed diabetic hyperlipidemia associated with type 2 diabetes mellitus ([place]) 08/05/2017    Priority: High  . Microalbuminuria due to type 2 diabetes mellitus ([place]) 08/05/2017    Priority: High  . Type 2 diabetes mellitus with stage 3 chronic kidney disease, without long-term current use of insulin ([place]) 05/05/2017    Priority: High  . Cancer ([place]) 07/27/2018  . Folliculitis- scalp; txed by Derm- Dr Jarome Matin 07/27/2018  . Bilateral lower extremity edema 11/17/2017  . RLS (restless legs syndrome) 06/17/2017  . GERD (gastroesophageal reflux disease) 05/05/2017  . Glossodynia 05/05/2017  . Costochondritis 05/05/2017  . Stage 3 chronic kidney disease ([place]) 04/22/2017  . Abdominal pain 03/25/2017  . CAD (coronary artery disease) 03/25/2017  . Diabetes mellitus without complication ([place]) 52/77/8242  . Snoring 03/25/2017  . Urinary retention due to benign prostatic hyperplasia 01/25/2015  . Primary osteoarthritis of right shoulder 01/24/2015  . Edema, peripheral 11/22/2014    Allergies:    Allergies  Allergen Reactions  . Exenatide     nausea  . Meperidine And Related   . Nadolol     Numbness in fingers  . Sitagliptin Phosphate [Sitagliptin]     Weight loss      Family history-  Reviewed; changed as appropriate  Social history-  Reviewed; changed as appropriate

## 2018-07-27 NOTE — Patient Instructions (Signed)
How to Avoid Diabetes Mellitus Problems You can take action to prevent or slow down problems that are caused by diabetes (diabetes mellitus). Following your diabetes plan and taking care of yourself can reduce your risk of serious or life-threatening complications. Manage your diabetes  Follow instructions from your health care providers about managing your diabetes. Your diabetes may be managed by a team of health care providers who can teach you how to care for yourself and can answer questions that you have.  Educate yourself about your condition so you can make healthy choices about eating and physical activity.  Check your blood sugar (glucose) levels as often as directed. Your health care provider will help you decide how often to check your blood glucose level depending on your treatment goals and how well you are meeting them.  Ask your health care provider if you should take low-dose aspirin daily and what dose is recommended for you. Taking low-dose aspirin daily is recommended to help prevent cardiovascular disease. Do not use nicotine or tobacco Do not use any products that contain nicotine or tobacco, such as cigarettes and e-cigarettes. If you need help quitting, ask your health care provider. Nicotine raises your risk for diabetes problems. If you quit using nicotine:  You will lower your risk for heart attack, stroke, nerve disease, and kidney disease.  Your cholesterol and blood pressure may improve.  Your blood circulation will improve.  Keep your blood pressure under control To control your blood pressure:  Follow instructions from your health care provider about meal planning, exercise, and medicines.  Make sure your health care provider checks your blood pressure at every medical visit.  A blood pressure reading consists of two numbers. Generally, the goal is to keep your top number (systolic pressure) at or below 130, and your bottom number (diastolic pressure) at or  below 80. Your health care provider may recommend a lower target blood pressure. Your individualized target blood pressure is determined based on:  Your age.  Your medicines.  How long you have had diabetes.  Any other medical conditions you have.  Keep your cholesterol under control To control your cholesterol:  Follow instructions from your health care provider about meal planning, exercise, and medicines.  Have your cholesterol checked at least once a year.  You may be prescribed medicine to lower cholesterol (statin). If you are not taking a statin, ask your health care provider if you should be.  Controlling your cholesterol may:  Help prevent heart disease and stroke. These are the most common health problems for people with diabetes.  Improve your blood flow.  Schedule and keep yearly physical exams and eye exams Your health care provider will tell you how often you need medical visits depending on your diabetes management plan. Keep all follow-up visits as directed. This is important so possible problems can be identified early and complications can be avoided or treated.  Every visit with your health care provider should include measuring your: ? Weight. ? Blood pressure. ? Blood glucose control.  Your A1c (hemoglobin A1c) level should be checked: ? At least 2 times a year, if you are meeting your treatment goals. ? 4 times a year, if you are not meeting treatment goals or if your treatment goals have changed.  Your blood lipids (lipid profile) should be checked yearly. You should also be checked yearly for protein in your urine (urine microalbumin).  If you have type 1 diabetes, get an eye exam 3-5 years after you  are diagnosed, and then once a year after your first exam.  If you have type 2 diabetes, get an eye exam as soon as you are diagnosed, and then once a year after your first exam.  Keep your vaccines current It is recommended that you receive:  A flu  (influenza) vaccine every year.  A pneumonia (pneumococcal) vaccine and a hepatitis B vaccine. If you are age 6 or older, you may get the pneumonia vaccine as a series of two separate shots.  Ask your health care provider which other vaccines may be recommended. Take care of your feet Diabetes may cause you to have poor blood circulation to your legs and feet. Because of this, taking care of your feet is very important. Diabetes can cause:  The skin on the feet to get thinner, break more easily, and heal more slowly.  Nerve damage in your legs and feet, which results in decreased feeling. You may not notice minor injuries that could lead to serious problems.  To avoid foot problems:  Check your skin and feet every day for cuts, bruises, redness, blisters, or sores.  Schedule a foot exam with your health care provider once every year. This exam includes: ? Inspecting of the structure and skin of your feet. ? Checking the pulses and sensation in your feet.  Make sure that your health care provider performs a visual foot exam at every medical visit.  Take care of your teeth People with poorly controlled diabetes are more likely to have gum (periodontal) disease. Diabetes can make periodontal diseases harder to control. If not treated, periodontal diseases can lead to tooth loss. To prevent this:  Brush your teeth twice a day.  Floss at least once a day.  Visit your dentist 2 times a year.  Drink responsibly Limit alcohol intake to no more than 1 drink a day for nonpregnant women and 2 drinks a day for men. One drink equals 12 oz of beer, 5 oz of wine, or 1 oz of hard liquor. It is important to eat food when you drink alcohol to avoid low blood glucose (hypoglycemia). Avoid alcohol if you:  Have a history of alcohol abuse or dependence.  Are pregnant.  Have liver disease, pancreatitis, advanced neuropathy, or severe hypertriglyceridemia.  Lessen stress Living with diabetes can  be stressful. When you are experiencing stress, your blood glucose may be affected in two ways:  Stress hormones may cause your blood glucose to rise.  You may be distracted from taking good care of yourself.  Be aware of your stress level and make changes to help you manage challenging situations. To lower your stress levels:  Consider joining a support group.  Do planned relaxation or meditation.  Do a hobby that you enjoy.  Maintain healthy relationships.  Exercise regularly.  Work with your health care provider or a mental health professional.  Summary  You can take action to prevent or slow down problems that are caused by diabetes (diabetes mellitus). Following your diabetes plan and taking care of yourself can reduce your risk of serious or life-threatening complications.  Follow instructions from your health care providers about managing your diabetes. Your diabetes may be managed by a team of health care providers who can teach you how to care for yourself and can answer questions that you have.  Your health care provider will tell you how often you need medical visits depending on your diabetes management plan. Keep all follow-up visits as directed. This is important  so possible problems can be identified early and complications can be avoided or treated. This information is not intended to replace advice given to you by your health care provider. Make sure you discuss any questions you have with your health care provider. Document Released: 07/23/2011 Document Revised: 08/03/2016 Document Reviewed: 08/03/2016 Elsevier Interactive Patient Education  2018 [employer].    Diabetes Mellitus and Nutrition When you have diabetes (diabetes mellitus), it is very important to have healthy eating habits because your blood sugar (glucose) levels are greatly affected by what you eat and drink. Eating healthy foods in the appropriate amounts, at about the same times every day, can  help you:  Control your blood glucose.  Lower your risk of heart disease.  Improve your blood pressure.  Reach or maintain a healthy weight.  Every person with diabetes is different, and each person has different needs for a meal plan. Your health care provider may recommend that you work with a diet and nutrition specialist (dietitian) to make a meal plan that is best for you. Your meal plan may vary depending on factors such as:  The calories you need.  The medicines you take.  Your weight.  Your blood glucose, blood pressure, and cholesterol levels.  Your activity level.  Other health conditions you have, such as heart or kidney disease.  How do carbohydrates affect me? Carbohydrates affect your blood glucose level more than any other type of food. Eating carbohydrates naturally increases the amount of glucose in your blood. Carbohydrate counting is a method for keeping track of how many carbohydrates you eat. Counting carbohydrates is important to keep your blood glucose at a healthy level, especially if you use insulin or take certain oral diabetes medicines. It is important to know how many carbohydrates you can safely have in each meal. This is different for every person. Your dietitian can help you calculate how many carbohydrates you should have at each meal and for snack. Foods that contain carbohydrates include:  Bread, cereal, rice, pasta, and crackers.  Potatoes and corn.  Peas, beans, and lentils.  Milk and yogurt.  Fruit and juice.  Desserts, such as cakes, cookies, ice cream, and candy.  How does alcohol affect me? Alcohol can cause a sudden decrease in blood glucose (hypoglycemia), especially if you use insulin or take certain oral diabetes medicines. Hypoglycemia can be a life-threatening condition. Symptoms of hypoglycemia (sleepiness, dizziness, and confusion) are similar to symptoms of having too much alcohol. If your health care provider says that  alcohol is safe for you, follow these guidelines:  Limit alcohol intake to no more than 1 drink per day for nonpregnant women and 2 drinks per day for men. One drink equals 12 oz of beer, 5 oz of wine, or 1 oz of hard liquor.  Do not drink on an empty stomach.  Keep yourself hydrated with water, diet soda, or unsweetened iced tea.  Keep in mind that regular soda, juice, and other mixers may contain a lot of sugar and must be counted as carbohydrates.  What are tips for following this plan? Reading food labels  Start by checking the serving size on the label. The amount of calories, carbohydrates, fats, and other nutrients listed on the label are based on one serving of the food. Many foods contain more than one serving per package.  Check the total grams (g) of carbohydrates in one serving. You can calculate the number of servings of carbohydrates in one serving by dividing the  total carbohydrates by 15. For example, if a food has 30 g of total carbohydrates, it would be equal to 2 servings of carbohydrates.  Check the number of grams (g) of saturated and trans fats in one serving. Choose foods that have low or no amount of these fats.  Check the number of milligrams (mg) of sodium in one serving. Most people should limit total sodium intake to less than 2,300 mg per day.  Always check the nutrition information of foods labeled as "low-fat" or "nonfat". These foods may be higher in added sugar or refined carbohydrates and should be avoided.  Talk to your dietitian to identify your daily goals for nutrients listed on the label. Shopping  Avoid buying canned, premade, or processed foods. These foods tend to be high in fat, sodium, and added sugar.  Shop around the outside edge of the grocery store. This includes fresh fruits and vegetables, bulk grains, fresh meats, and fresh dairy. Cooking  Use low-heat cooking methods, such as baking, instead of high-heat cooking methods like deep  frying.  Cook using healthy oils, such as olive, canola, or sunflower oil.  Avoid cooking with butter, cream, or high-fat meats. Meal planning  Eat meals and snacks regularly, preferably at the same times every day. Avoid going long periods of time without eating.  Eat foods high in fiber, such as fresh fruits, vegetables, beans, and whole grains. Talk to your dietitian about how many servings of carbohydrates you can eat at each meal.  Eat 4-6 ounces of lean protein each day, such as lean meat, chicken, fish, eggs, or tofu. 1 ounce is equal to 1 ounce of meat, chicken, or fish, 1 egg, or 1/4 cup of tofu.  Eat some foods each day that contain healthy fats, such as avocado, nuts, seeds, and fish. Lifestyle   Check your blood glucose regularly.  Exercise at least 30 minutes 5 or more days each week, or as told by your health care provider.  Take medicines as told by your health care provider.  Do not use any products that contain nicotine or tobacco, such as cigarettes and e-cigarettes. If you need help quitting, ask your health care provider.  Work with a Social worker or diabetes educator to identify strategies to manage stress and any emotional and social challenges. What are some questions to ask my health care provider?  Do I need to meet with a diabetes educator?  Do I need to meet with a dietitian?  What number can I call if I have questions?  When are the best times to check my blood glucose? Where to find more information:  American Diabetes Association: diabetes.org/food-and-fitness/food  Academy of Nutrition and Dietetics: PokerClues.dk  [employer] of Diabetes and Digestive and Kidney Diseases (NIH): ContactWire.be Summary  A healthy meal plan will help you control your blood glucose and maintain a healthy lifestyle.  Working with a diet  and nutrition specialist (dietitian) can help you make a meal plan that is best for you.  Keep in mind that carbohydrates and alcohol have immediate effects on your blood glucose levels. It is important to count carbohydrates and to use alcohol carefully. This information is not intended to replace advice given to you by your health care provider. Make sure you discuss any questions you have with your health care provider. Document Released: 08/01/2005 Document Revised: 12/09/2016 Document Reviewed: 12/09/2016 Elsevier Interactive Patient Education  [employer].

## 2018-08-15 ENCOUNTER — Other Ambulatory Visit: Payer: Self-pay | Admitting: Family Medicine

## 2018-08-18 DIAGNOSIS — Z23 Encounter for immunization: Secondary | ICD-10-CM | POA: Diagnosis not present

## 2018-08-25 ENCOUNTER — Encounter: Payer: Self-pay | Admitting: Cardiology

## 2018-08-25 ENCOUNTER — Ambulatory Visit (INDEPENDENT_AMBULATORY_CARE_PROVIDER_SITE_OTHER): Payer: Medicare Other | Admitting: Cardiology

## 2018-08-25 VITALS — BP 160/70 | HR 61 | Ht 72.0 in | Wt 161.8 lb

## 2018-08-25 DIAGNOSIS — I152 Hypertension secondary to endocrine disorders: Secondary | ICD-10-CM

## 2018-08-25 DIAGNOSIS — I2510 Atherosclerotic heart disease of native coronary artery without angina pectoris: Secondary | ICD-10-CM | POA: Diagnosis not present

## 2018-08-25 DIAGNOSIS — I10 Essential (primary) hypertension: Secondary | ICD-10-CM

## 2018-08-25 DIAGNOSIS — R0683 Snoring: Secondary | ICD-10-CM

## 2018-08-25 DIAGNOSIS — E782 Mixed hyperlipidemia: Secondary | ICD-10-CM

## 2018-08-25 DIAGNOSIS — E1169 Type 2 diabetes mellitus with other specified complication: Secondary | ICD-10-CM | POA: Diagnosis not present

## 2018-08-25 DIAGNOSIS — E1159 Type 2 diabetes mellitus with other circulatory complications: Secondary | ICD-10-CM

## 2018-08-25 DIAGNOSIS — R609 Edema, unspecified: Secondary | ICD-10-CM

## 2018-08-25 DIAGNOSIS — I509 Heart failure, unspecified: Secondary | ICD-10-CM | POA: Diagnosis not present

## 2018-08-25 NOTE — Progress Notes (Signed)
Cardiology Office Note:    Date:  08/25/2018   ID:  George Barnett, DOB [birth year], MRN [id number]  PCP:  Mellody Dance, DO  Cardiologist:  Jenne Campus, MD    Referring MD: Mellody Dance, DO   Chief Complaint  Patient presents with  . Dizziness    For about a week, has not fallen  . Hypotension  I am here to follow-up  History of Present Illness:    George Barnett is a 84 y.o. male is a patient of Dr. Oran Rein.  He is excellently managed.  His past medical history include coronary artery disease status post stenting and a small MI in 1999 since that time there is no problem there is no chest pain tightness squeezing pressure burning chest.  Lately he is been struggling with some skin problems involved his scalp that bothers him a lot he cannot go outside during the day.  Denies have any chest pain tightness squeezing pressure burning chest described to have some episode of dizziness when he gets up very quickly not to the point of passing out or falling down but obviously there is concerning he also described the fact that when he is at home his blood pressure is usually less than 202 systolic.  Interestingly today in the office 160/70 he tells me that he got whitecoat hypertension always when he goes to doctor's office his blood pressure is very high.  He also noted some swelling of lower extremities that happened couple weeks ago when he started taking prednisone for his scalp it is better right now he is off prednisone however swelling is still there minimal though.  Past Medical History:  Diagnosis Date  . Cancer [facility])    SKIN CANCER   . Cataract   . Diabetes mellitus without complication ([place])   . Elbow effusion, left   . Frequency of urination   . GERD (gastroesophageal reflux disease)   . Heart attack ([place])   . Heart disease   . Hypertension   . Primary osteoarthritis of right shoulder 01/24/2015  . Prostate enlargement   . Tremors of nervous system   . Urinary  retention due to benign prostatic hyperplasia 01/25/2015    Past Surgical History:  Procedure Laterality Date  . CARDIAC CATHETERIZATION     1999  . cardio stint    . HERNIA REPAIR     BILAT   . MOHS SURGERY     BILAT EARS   . PROSTATE BIOPSY    . TOTAL SHOULDER ARTHROPLASTY Right 01/24/2015   Procedure: RIGHT TOTAL SHOULDER ARTHROPLASTY;  Surgeon: Marchia Bond, MD;  Location: [facility];  Service: Orthopedics;  Laterality: Right;    Current Medications: Current Meds  Medication Sig  . aspirin 81 MG tablet Take 81 mg by mouth daily.  Marland Kitchen atorvastatin (LIPITOR) 10 MG tablet Take 1-1.5 tablets (10-15 mg total) by mouth daily. Mon Wed Fri 15 mg Sun Tu Th Sa 10 mg  . clobetasol cream (TEMOVATE) 5.42 % Apply 1 application topically 2 (two) times daily.  . Cobalamine Combinations (H-06 + FOLIC ACID PO) Take by mouth.  . doxycycline (DORYX) 100 MG EC tablet Take 100 mg by mouth 2 (two) times daily.  Marland Kitchen glimepiride (AMARYL) 1 MG tablet TAKE 1 TABLET DAILY WITH BREAKFAST  . hydrochlorothiazide (HYDRODIURIL) 25 MG tablet TAKE 1 TABLET DAILY  . hydrocortisone 2.5 % ointment   . losartan (COZAAR) 25 MG tablet TAKE 1 TABLET DAILY  . metFORMIN (GLUCOPHAGE) 500 MG tablet TAKE 1 TABLET  IN THE MORNING AND 1 TABLET IN THE EVENING  . metoprolol succinate (TOPROL-XL) 25 MG 24 hr tablet TAKE 1 TABLET DAILY     Allergies:   Exenatide; Meperidine and related; Nadolol; and Sitagliptin phosphate [sitagliptin]   Social History   Socioeconomic History  . Marital status: Married    Spouse name: Not on file  . Number of children: Not on file  . Years of education: Not on file  . Highest education level: Not on file  Occupational History  . Not on file  Social Needs  . Financial resource strain: Not on file  . Food insecurity:    Worry: Not on file    Inability: Not on file  . Transportation needs:    Medical: Not on file    Non-medical: Not on file  Tobacco Use  . Smoking status: Never Smoker  .  Smokeless tobacco: Never Used  Substance and Sexual Activity  . Alcohol use: No  . Drug use: No  . Sexual activity: Not on file  Lifestyle  . Physical activity:    Days per week: Not on file    Minutes per session: Not on file  . Stress: Not on file  Relationships  . Social connections:    Talks on phone: Not on file    Gets together: Not on file    Attends religious service: Not on file    Active member of club or organization: Not on file    Attends meetings of clubs or organizations: Not on file    Relationship status: Not on file  Other Topics Concern  . Not on file  Social History Narrative  . Not on file     Family History: The patient's family history includes Hypertension in his brother. ROS:   Please see the history of present illness.    All 14 point review of systems negative except as described per history of present illness  EKGs/Labs/Other Studies Reviewed:      Recent Labs: 03/17/2018: ALT 13; BUN 30; Creatinine, Ser 1.35; Potassium 4.2; Sodium 143; TSH 1.790  Recent Lipid Panel    Component Value Date/Time   CHOL 105 03/17/2018 0926   TRIG 52 03/17/2018 0926   HDL 49 03/17/2018 0926   CHOLHDL 2.1 03/17/2018 0926   LDLCALC 46 03/17/2018 0926    Physical Exam:    VS:  BP (!) 160/70   Pulse 61   Ht 6' (1.829 m)   Wt 161 lb 12.8 oz (73.4 kg)   SpO2 96%   BMI 21.94 kg/m     Wt Readings from Last 3 Encounters:  08/25/18 161 lb 12.8 oz (73.4 kg)  07/27/18 159 lb 6.4 oz (72.3 kg)  03/16/18 162 lb (73.5 kg)     GEN:  Well nourished, well developed in no acute distress HEENT: Normal NECK: No JVD; No carotid bruits LYMPHATICS: No lymphadenopathy CARDIAC: RRR, no murmurs, no rubs, no gallops RESPIRATORY:  Clear to auscultation without rales, wheezing or rhonchi  ABDOMEN: Soft, non-tender, non-distended MUSCULOSKELETAL:  No edema; No deformity  SKIN: Warm and dry LOWER EXTREMITIES: Minimal swelling around ankles. NEUROLOGIC:  Alert and  oriented x 3 PSYCHIATRIC:  Normal affect   ASSESSMENT:    1. Coronary artery disease involving native heart without angina pectoris, unspecified vessel or lesion type   2. Hypertension associated with diabetes ([place])   3. Mixed diabetic hyperlipidemia associated with type 2 diabetes mellitus ([place])   4. Edema, peripheral   5. Snoring  PLAN:    In order of problems listed above:  1. Coronary artery disease.  Appears to be stable from that point review he is on appropriate medication which include aspirin as well as statin.  He is also on beta-blocker I will continue with that management. 2. Hypertension blood pressure high in the office today but low at home I asked him to bring his blood pressure monitor which was next time when he will be here so we can verify the accuracy of the device.  I will cut down his hydrochlorothiazide to only 12.5 mg daily I want to avoid any more orthostatic hypotension that can lead to falls and some injury.  Obviously will have to watch swelling very carefully. 3. Peripheral edema: I will ask him to have proBNP as well as Chem-7 today.  TSH will be checked as well. 4. Snoring obviously an issue according to his wife he does have typical signs and symptoms of obstructive sleep apnea which includes loud snoring stopped breathing at night as well as very easy for him to fall asleep during the day while watching TV.  He did have a sleep study apparently doing year ago which was negative very surprised.  That may need to be investigated in the future.   Medication Adjustments/Labs and Tests Ordered: Current medicines are reviewed at length with the patient today.  Concerns regarding medicines are outlined above.  No orders of the defined types were placed in this encounter.  Medication changes: No orders of the defined types were placed in this encounter.   Signed, Park Liter, MD, [facility] 08/25/2018 2:23 PM    [place]

## 2018-08-25 NOTE — Patient Instructions (Signed)
Medication Instructions:  Your physician has recommended you make the following change in your medication:  Decrease your Hydrochlorothiazide to 12.5 mg, by cuting your tablet in half If you need a refill on your cardiac medications before your next appointment, please call your pharmacy.   Lab work: Your physician recommends that you have lab work today: BMP and BNP  If you have labs (blood work) drawn today and your tests are completely normal, you will receive your results only by: Marland Kitchen MyChart Message (if you have MyChart) OR . A paper copy in the mail If you have any lab test that is abnormal or we need to change your treatment, we will call you to review the results.  Testing/Procedures: None ordered  Follow-Up: At [facility], you and your health needs are our priority.  As part of our continuing mission to provide you with exceptional heart care, we have created designated Provider Care Teams.  These Care Teams include your primary Cardiologist (physician) and Advanced Practice Providers (APPs -  Physician Assistants and Nurse Practitioners) who all work together to provide you with the care you need, when you need it. You will need a follow up appointment in 6 weeks.   You may see Jenne Campus or another member of our [employer] Provider Team in [place]: Shirlee More, MD . Jyl Heinz, MD  Any Other Special Instructions Will Be Listed Below (If Applicable).

## 2018-08-26 LAB — BASIC METABOLIC PANEL
BUN/Creatinine Ratio: 17 (ref 10–24)
BUN: 23 mg/dL (ref 8–27)
CALCIUM: 9.3 mg/dL (ref 8.6–10.2)
CHLORIDE: 92 mmol/L — AB (ref 96–106)
CO2: 23 mmol/L (ref 20–29)
Creatinine, Ser: 1.36 mg/dL — ABNORMAL HIGH (ref 0.76–1.27)
GFR calc non Af Amer: 48 mL/min/{1.73_m2} — ABNORMAL LOW (ref >59)
GFR, EST AFRICAN AMERICAN: 55 mL/min/{1.73_m2} — AB (ref >59)
GLUCOSE: 176 mg/dL — AB (ref 65–99)
Potassium: 4.3 mmol/L (ref 3.5–5.2)
SODIUM: 133 mmol/L — AB (ref 134–144)

## 2018-08-26 LAB — PRO B NATRIURETIC PEPTIDE: NT-PRO BNP: 368 pg/mL (ref 0–486)

## 2018-08-28 ENCOUNTER — Telehealth: Payer: Self-pay | Admitting: Emergency Medicine

## 2018-08-28 NOTE — Telephone Encounter (Signed)
Left message for patient to return call regarding lab work.

## 2018-09-01 ENCOUNTER — Telehealth: Payer: Self-pay | Admitting: Emergency Medicine

## 2018-09-01 NOTE — Telephone Encounter (Signed)
Patient informed of transthoracic echocardiogram results.

## 2018-09-11 ENCOUNTER — Other Ambulatory Visit: Payer: Self-pay | Admitting: Family Medicine

## 2018-09-11 DIAGNOSIS — E1159 Type 2 diabetes mellitus with other circulatory complications: Secondary | ICD-10-CM

## 2018-09-11 DIAGNOSIS — I10 Essential (primary) hypertension: Principal | ICD-10-CM

## 2018-09-17 DIAGNOSIS — L570 Actinic keratosis: Secondary | ICD-10-CM | POA: Diagnosis not present

## 2018-09-17 DIAGNOSIS — L218 Other seborrheic dermatitis: Secondary | ICD-10-CM | POA: Diagnosis not present

## 2018-09-17 DIAGNOSIS — Z85828 Personal history of other malignant neoplasm of skin: Secondary | ICD-10-CM | POA: Diagnosis not present

## 2018-09-17 DIAGNOSIS — L820 Inflamed seborrheic keratosis: Secondary | ICD-10-CM | POA: Diagnosis not present

## 2018-09-17 DIAGNOSIS — L821 Other seborrheic keratosis: Secondary | ICD-10-CM | POA: Diagnosis not present

## 2018-09-17 DIAGNOSIS — D692 Other nonthrombocytopenic purpura: Secondary | ICD-10-CM | POA: Diagnosis not present

## 2018-09-19 ENCOUNTER — Other Ambulatory Visit: Payer: Self-pay | Admitting: Family Medicine

## 2018-09-19 DIAGNOSIS — N183 Chronic kidney disease, stage 3 unspecified: Secondary | ICD-10-CM

## 2018-09-19 DIAGNOSIS — E1122 Type 2 diabetes mellitus with diabetic chronic kidney disease: Secondary | ICD-10-CM

## 2018-09-19 DIAGNOSIS — R809 Proteinuria, unspecified: Secondary | ICD-10-CM

## 2018-09-19 DIAGNOSIS — I10 Essential (primary) hypertension: Secondary | ICD-10-CM

## 2018-09-19 DIAGNOSIS — E1159 Type 2 diabetes mellitus with other circulatory complications: Secondary | ICD-10-CM

## 2018-09-19 DIAGNOSIS — E1129 Type 2 diabetes mellitus with other diabetic kidney complication: Secondary | ICD-10-CM

## 2018-10-02 DIAGNOSIS — L308 Other specified dermatitis: Secondary | ICD-10-CM | POA: Diagnosis not present

## 2018-10-02 DIAGNOSIS — L0889 Other specified local infections of the skin and subcutaneous tissue: Secondary | ICD-10-CM | POA: Diagnosis not present

## 2018-10-02 DIAGNOSIS — Z85828 Personal history of other malignant neoplasm of skin: Secondary | ICD-10-CM | POA: Diagnosis not present

## 2018-10-12 ENCOUNTER — Ambulatory Visit (INDEPENDENT_AMBULATORY_CARE_PROVIDER_SITE_OTHER): Payer: Medicare Other | Admitting: Cardiology

## 2018-10-12 ENCOUNTER — Encounter: Payer: Self-pay | Admitting: Cardiology

## 2018-10-12 VITALS — BP 110/68 | HR 57 | Ht 72.0 in | Wt 164.0 lb

## 2018-10-12 DIAGNOSIS — R0683 Snoring: Secondary | ICD-10-CM

## 2018-10-12 DIAGNOSIS — E119 Type 2 diabetes mellitus without complications: Secondary | ICD-10-CM | POA: Diagnosis not present

## 2018-10-12 DIAGNOSIS — I2510 Atherosclerotic heart disease of native coronary artery without angina pectoris: Secondary | ICD-10-CM | POA: Diagnosis not present

## 2018-10-12 NOTE — Progress Notes (Signed)
Cardiology Office Note:    Date:  10/12/2018   ID:  George Barnett, DOB [birth year], MRN [id number]  PCP:  Mellody Dance, DO  Cardiologist:  Jenne Campus, MD    Referring MD: Mellody Dance, DO   Chief Complaint  Patient presents with  . 6 week follow up  Still getting dizzy  History of Present Illness:    George Barnett is a 84 y.o. male with coronary artery disease will be managing his dizziness look like this is orthostatic issue last time when I saw him his blood pressure was high in my office I have asked him to check blood pressure at home he brought results to me and his blood pressure is low last time I cut down his atrial flutter as it did not have look like we have some more way to go I asked him today to completely stop hydrochlorthiazide he does have some swelling of ankle and I told him that if stopping of hydrochlorothiazide will cause worsening of the swelling we need to start back on half dose of hydrochlorothiazide and at the same time we will try to decrease dose of heat or closer or metoprolol.  I prefer not to stop because on the Toprol because additional benefits of this medication.  Past Medical History:  Diagnosis Date  . Cancer [facility])    SKIN CANCER   . Cataract   . Diabetes mellitus without complication ([place])   . Elbow effusion, left   . Frequency of urination   . GERD (gastroesophageal reflux disease)   . Heart attack ([place])   . Heart disease   . Hypertension   . Primary osteoarthritis of right shoulder 01/24/2015  . Prostate enlargement   . Tremors of nervous system   . Urinary retention due to benign prostatic hyperplasia 01/25/2015    Past Surgical History:  Procedure Laterality Date  . CARDIAC CATHETERIZATION     1999  . cardio stint    . HERNIA REPAIR     BILAT   . MOHS SURGERY     BILAT EARS   . PROSTATE BIOPSY    . TOTAL SHOULDER ARTHROPLASTY Right 01/24/2015   Procedure: RIGHT TOTAL SHOULDER ARTHROPLASTY;  Surgeon: Marchia Bond,  MD;  Location: [place];  Service: Orthopedics;  Laterality: Right;    Current Medications: Current Meds  Medication Sig  . aspirin 81 MG tablet Take 81 mg by mouth daily.  Marland Kitchen atorvastatin (LIPITOR) 10 MG tablet Take 1-1.5 tablets (10-15 mg total) by mouth daily. Mon Wed Fri 15 mg Sun Tu Th Sa 10 mg  . ciprofloxacin (CIPRO) 500 MG tablet Take 1 tablet by mouth 2 (two) times daily.  . Cobalamine Combinations (O-13 + FOLIC ACID PO) Take by mouth.  . doxycycline (DORYX) 100 MG EC tablet Take 100 mg by mouth 2 (two) times daily.  Marland Kitchen glimepiride (AMARYL) 1 MG tablet TAKE 1 TABLET DAILY WITH BREAKFAST  . hydrochlorothiazide (HYDRODIURIL) 25 MG tablet TAKE 1 TABLET DAILY (Patient taking differently: 12.5 mg. )  . losartan (COZAAR) 25 MG tablet TAKE 1 TABLET DAILY  . metFORMIN (GLUCOPHAGE) 500 MG tablet TAKE 1 TABLET IN THE MORNING AND 1 TABLET IN THE EVENING  . metoprolol succinate (TOPROL-XL) 25 MG 24 hr tablet TAKE 1 TABLET DAILY     Allergies:   Exenatide; Meperidine and related; Nadolol; and Sitagliptin phosphate [sitagliptin]   Social History   Socioeconomic History  . Marital status: Married    Spouse name: Not on file  . Number  of children: Not on file  . Years of education: Not on file  . Highest education level: Not on file  Occupational History  . Not on file  Social Needs  . Financial resource strain: Not on file  . Food insecurity:    Worry: Not on file    Inability: Not on file  . Transportation needs:    Medical: Not on file    Non-medical: Not on file  Tobacco Use  . Smoking status: Never Smoker  . Smokeless tobacco: Never Used  Substance and Sexual Activity  . Alcohol use: No  . Drug use: No  . Sexual activity: Not on file  Lifestyle  . Physical activity:    Days per week: Not on file    Minutes per session: Not on file  . Stress: Not on file  Relationships  . Social connections:    Talks on phone: Not on file    Gets together: Not on file    Attends  religious service: Not on file    Active member of club or organization: Not on file    Attends meetings of clubs or organizations: Not on file    Relationship status: Not on file  Other Topics Concern  . Not on file  Social History Narrative  . Not on file     Family History: The patient's family history includes Hypertension in his brother. ROS:   Please see the history of present illness.    All 14 point review of systems negative except as described per history of present illness  EKGs/Labs/Other Studies Reviewed:      Recent Labs: 03/17/2018: ALT 13; TSH 1.790 08/25/2018: BUN 23; Creatinine, Ser 1.36; NT-Pro BNP 368; Potassium 4.3; Sodium 133  Recent Lipid Panel    Component Value Date/Time   CHOL 105 03/17/2018 0926   TRIG 52 03/17/2018 0926   HDL 49 03/17/2018 0926   CHOLHDL 2.1 03/17/2018 0926   LDLCALC 46 03/17/2018 0926    Physical Exam:    VS:  BP 110/68   Pulse (!) 57   Ht 6' (1.829 m)   Wt 164 lb (74.4 kg)   SpO2 99%   BMI 22.24 kg/m     Wt Readings from Last 3 Encounters:  10/12/18 164 lb (74.4 kg)  08/25/18 161 lb 12.8 oz (73.4 kg)  07/27/18 159 lb 6.4 oz (72.3 kg)     GEN:  Well nourished, well developed in no acute distress HEENT: Normal NECK: No JVD; No carotid bruits LYMPHATICS: No lymphadenopathy CARDIAC: RRR, no murmurs, no rubs, no gallops RESPIRATORY:  Clear to auscultation without rales, wheezing or rhonchi  ABDOMEN: Soft, non-tender, non-distended MUSCULOSKELETAL:  No edema; No deformity  SKIN: Warm and dry LOWER EXTREMITIES: no swelling NEUROLOGIC:  Alert and oriented x 3 PSYCHIATRIC:  Normal affect   ASSESSMENT:    1. Coronary artery disease involving native heart without angina pectoris, unspecified vessel or lesion type   2. Diabetes mellitus without complication ([place])   3. Snoring    PLAN:    In order of problems listed above:  1. Coronary artery disease doing well from that point review continue present  management. 2. Diabetes followed by internal medicine team stable. 3. Orthostatic hypotension with hypertension overall we will completely discontinue hydrochlorothiazide but may not be enough in the future we may be forced to stop Cozaar or Toprol. 4. Snoring will talk about sleep study.   Medication Adjustments/Labs and Tests Ordered: Current medicines are reviewed at length  with the patient today.  Concerns regarding medicines are outlined above.  No orders of the defined types were placed in this encounter.  Medication changes: No orders of the defined types were placed in this encounter.   Signed, Park Liter, MD, [facility] 10/12/2018 3:55 PM    [place]

## 2018-10-12 NOTE — Patient Instructions (Signed)
Medication Instructions:  Your physician has recommended you make the following change in your medication:   STOP: Hydrochlorothiazide  If you need a refill on your cardiac medications before your next appointment, please call your pharmacy.   Lab work: None.  If you have labs (blood work) drawn today and your tests are completely normal, you will receive your results only by: Marland Kitchen MyChart Message (if you have MyChart) OR . A paper copy in the mail If you have any lab test that is abnormal or we need to change your treatment, we will call you to review the results.  Testing/Procedures: None.   Follow-Up: At [facility], you and your health needs are our priority.  As part of our continuing mission to provide you with exceptional heart care, we have created designated Provider Care Teams.  These Care Teams include your primary Cardiologist (physician) and Advanced Practice Providers (APPs -  Physician Assistants and Nurse Practitioners) who all work together to provide you with the care you need, when you need it. You will need a follow up appointment in 2 weeks.  Please call our office 2 months in advance to schedule this appointment.  You may see No primary care provider on file. or another member of our [employer] Provider Team in [place]: Shirlee More, MD . Jyl Heinz, MD  Any Other Special Instructions Will Be Listed Below (If Applicable).

## 2018-10-23 DIAGNOSIS — L570 Actinic keratosis: Secondary | ICD-10-CM | POA: Diagnosis not present

## 2018-10-23 DIAGNOSIS — Z85828 Personal history of other malignant neoplasm of skin: Secondary | ICD-10-CM | POA: Diagnosis not present

## 2018-11-05 ENCOUNTER — Ambulatory Visit (INDEPENDENT_AMBULATORY_CARE_PROVIDER_SITE_OTHER): Payer: Medicare Other | Admitting: Cardiology

## 2018-11-05 VITALS — BP 160/82 | HR 56 | Ht 72.0 in | Wt 166.8 lb

## 2018-11-05 DIAGNOSIS — I10 Essential (primary) hypertension: Secondary | ICD-10-CM | POA: Diagnosis not present

## 2018-11-05 DIAGNOSIS — E1159 Type 2 diabetes mellitus with other circulatory complications: Secondary | ICD-10-CM | POA: Diagnosis not present

## 2018-11-05 DIAGNOSIS — I2510 Atherosclerotic heart disease of native coronary artery without angina pectoris: Secondary | ICD-10-CM | POA: Diagnosis not present

## 2018-11-05 DIAGNOSIS — I951 Orthostatic hypotension: Secondary | ICD-10-CM

## 2018-11-05 HISTORY — DX: Orthostatic hypotension: I95.1

## 2018-11-05 NOTE — Progress Notes (Signed)
Cardiology Office Note:    Date:  11/05/2018   ID:  George Barnett, DOB 03-07-1933, MRN [id number]  PCP:  Mellody Dance, DO  Cardiologist:  Jenne Campus, MD    Referring MD: Mellody Dance, DO   Chief Complaint  Patient presents with  . 2 Week Follow-up  Doing slightly better  History of Present Illness:    George Barnett is a 84 y.o. male with orthostatic hypotension and hypotension been struggling to take care of this problem I discontinue his hydrochlorothiazide which helped somewhat he brought blood pressure measurements today to me and majority of time those are low.  We did talk about staying hydrated as well as a little liberation of salt however I think we reached the point of additional medication need to be discontinued, so, I asked him to stop Toprol-XL.  Past Medical History:  Diagnosis Date  . Cancer [facility])    SKIN CANCER   . Cataract   . Diabetes mellitus without complication ([place])   . Elbow effusion, left   . Frequency of urination   . GERD (gastroesophageal reflux disease)   . Heart attack ([place])   . Heart disease   . Hypertension   . Primary osteoarthritis of right shoulder 01/24/2015  . Prostate enlargement   . Tremors of nervous system   . Urinary retention due to benign prostatic hyperplasia 01/25/2015    Past Surgical History:  Procedure Laterality Date  . CARDIAC CATHETERIZATION     1999  . cardio stint    . HERNIA REPAIR     BILAT   . MOHS SURGERY     BILAT EARS   . PROSTATE BIOPSY    . TOTAL SHOULDER ARTHROPLASTY Right 01/24/2015   Procedure: RIGHT TOTAL SHOULDER ARTHROPLASTY;  Surgeon: Marchia Bond, MD;  Location: [place];  Service: Orthopedics;  Laterality: Right;    Current Medications: Current Meds  Medication Sig  . aspirin 81 MG tablet Take 81 mg by mouth daily.  Marland Kitchen atorvastatin (LIPITOR) 10 MG tablet Take 1-1.5 tablets (10-15 mg total) by mouth daily. Mon Wed Fri 15 mg Sun Tu Th Sa 10 mg  . Cobalamine Combinations (J-19 + FOLIC  ACID PO) Take by mouth.  Marland Kitchen glimepiride (AMARYL) 1 MG tablet TAKE 1 TABLET DAILY WITH BREAKFAST  . losartan (COZAAR) 25 MG tablet TAKE 1 TABLET DAILY  . metFORMIN (GLUCOPHAGE) 500 MG tablet TAKE 1 TABLET IN THE MORNING AND 1 TABLET IN THE EVENING  . metoprolol succinate (TOPROL-XL) 25 MG 24 hr tablet TAKE 1 TABLET DAILY     Allergies:   Exenatide; Meperidine and related; Nadolol; and Sitagliptin phosphate [sitagliptin]   Social History   Socioeconomic History  . Marital status: Married    Spouse name: Not on file  . Number of children: Not on file  . Years of education: Not on file  . Highest education level: Not on file  Occupational History  . Not on file  Social Needs  . Financial resource strain: Not on file  . Food insecurity:    Worry: Not on file    Inability: Not on file  . Transportation needs:    Medical: Not on file    Non-medical: Not on file  Tobacco Use  . Smoking status: Never Smoker  . Smokeless tobacco: Never Used  Substance and Sexual Activity  . Alcohol use: No  . Drug use: No  . Sexual activity: Not on file  Lifestyle  . Physical activity:    Days per week:  Not on file    Minutes per session: Not on file  . Stress: Not on file  Relationships  . Social connections:    Talks on phone: Not on file    Gets together: Not on file    Attends religious service: Not on file    Active member of club or organization: Not on file    Attends meetings of clubs or organizations: Not on file    Relationship status: Not on file  Other Topics Concern  . Not on file  Social History Narrative  . Not on file     Family History: The patient's family history includes Hypertension in his brother. ROS:   Please see the history of present illness.    All 14 point review of systems negative except as described per history of present illness  EKGs/Labs/Other Studies Reviewed:      Recent Labs: 03/17/2018: ALT 13; TSH 1.790 08/25/2018: BUN 23; Creatinine, Ser  1.36; NT-Pro BNP 368; Potassium 4.3; Sodium 133  Recent Lipid Panel    Component Value Date/Time   CHOL 105 03/17/2018 0926   TRIG 52 03/17/2018 0926   HDL 49 03/17/2018 0926   CHOLHDL 2.1 03/17/2018 0926   LDLCALC 46 03/17/2018 0926    Physical Exam:    VS:  BP (!) 160/82   Pulse (!) 56   Wt 166 lb 12.8 oz (75.7 kg)   SpO2 96%   BMI 22.62 kg/m     Wt Readings from Last 3 Encounters:  11/05/18 166 lb 12.8 oz (75.7 kg)  10/12/18 164 lb (74.4 kg)  08/25/18 161 lb 12.8 oz (73.4 kg)     GEN:  Well nourished, well developed in no acute distress HEENT: Normal NECK: No JVD; No carotid bruits LYMPHATICS: No lymphadenopathy CARDIAC: RRR, no murmurs, no rubs, no gallops RESPIRATORY:  Clear to auscultation without rales, wheezing or rhonchi  ABDOMEN: Soft, non-tender, non-distended MUSCULOSKELETAL:  No edema; No deformity  SKIN: Warm and dry LOWER EXTREMITIES: no swelling NEUROLOGIC:  Alert and oriented x 3 PSYCHIATRIC:  Normal affect   ASSESSMENT:    1. Coronary artery disease involving native heart without angina pectoris, unspecified vessel or lesion type   2. Hypertension associated with diabetes ([place])   3. Orthostatic hypotension    PLAN:    In order of problems listed above:  1. Coronary artery disease stable on appropriate medications which I will continue.  Sadly because of hypotension will need to discontinue his Toprol-XL. 2. Hypertension we have opposite problem when her blood pressure being low. 3. Orthostatic hypotension plan as outlined above.   Medication Adjustments/Labs and Tests Ordered: Current medicines are reviewed at length with the patient today.  Concerns regarding medicines are outlined above.  No orders of the defined types were placed in this encounter.  Medication changes: No orders of the defined types were placed in this encounter.   Signed, Park Liter, MD, [facility] 11/05/2018 12:28 PM    [place]

## 2018-11-05 NOTE — Patient Instructions (Signed)
Medication Instructions:  Your physician has recommended you make the following change in your medication:  STOP your Metoprolol  If you need a refill on your cardiac medications before your next appointment, please call your pharmacy.   Lab work: None ordered If you have labs (blood work) drawn today and your tests are completely normal, you will receive your results only by: Marland Kitchen MyChart Message (if you have MyChart) OR . A paper copy in the mail If you have any lab test that is abnormal or we need to change your treatment, we will call you to review the results.  Testing/Procedures: None ordered  Follow-Up: At [facility], you and your health needs are our priority.  As part of our continuing mission to provide you with exceptional heart care, we have created designated Provider Care Teams.  These Care Teams include your primary Cardiologist (physician) and Advanced Practice Providers (APPs -  Physician Assistants and Nurse Practitioners) who all work together to provide you with the care you need, when you need it. You will need a follow up appointment in 2 months.  You may see Jenne Campus or another member of our [employer] Provider Team in [place]: Shirlee More, MD . Jyl Heinz, MD  Any Other Special Instructions Will Be Listed Below (If Applicable).

## 2018-11-06 ENCOUNTER — Encounter: Payer: Self-pay | Admitting: Cardiology

## 2018-11-27 ENCOUNTER — Encounter: Payer: Self-pay | Admitting: Family Medicine

## 2018-11-27 ENCOUNTER — Ambulatory Visit (INDEPENDENT_AMBULATORY_CARE_PROVIDER_SITE_OTHER): Payer: Medicare Other | Admitting: Family Medicine

## 2018-11-27 VITALS — BP 140/80 | HR 77 | Temp 97.6°F | Ht 72.0 in | Wt 169.2 lb

## 2018-11-27 DIAGNOSIS — R809 Proteinuria, unspecified: Secondary | ICD-10-CM

## 2018-11-27 DIAGNOSIS — E1129 Type 2 diabetes mellitus with other diabetic kidney complication: Secondary | ICD-10-CM | POA: Diagnosis not present

## 2018-11-27 DIAGNOSIS — I10 Essential (primary) hypertension: Secondary | ICD-10-CM | POA: Diagnosis not present

## 2018-11-27 DIAGNOSIS — I872 Venous insufficiency (chronic) (peripheral): Secondary | ICD-10-CM

## 2018-11-27 DIAGNOSIS — N183 Chronic kidney disease, stage 3 unspecified: Secondary | ICD-10-CM

## 2018-11-27 DIAGNOSIS — E1169 Type 2 diabetes mellitus with other specified complication: Secondary | ICD-10-CM | POA: Diagnosis not present

## 2018-11-27 DIAGNOSIS — E1159 Type 2 diabetes mellitus with other circulatory complications: Secondary | ICD-10-CM | POA: Diagnosis not present

## 2018-11-27 DIAGNOSIS — I951 Orthostatic hypotension: Secondary | ICD-10-CM

## 2018-11-27 DIAGNOSIS — R251 Tremor, unspecified: Secondary | ICD-10-CM

## 2018-11-27 DIAGNOSIS — E1122 Type 2 diabetes mellitus with diabetic chronic kidney disease: Secondary | ICD-10-CM

## 2018-11-27 DIAGNOSIS — E782 Mixed hyperlipidemia: Secondary | ICD-10-CM

## 2018-11-27 DIAGNOSIS — I152 Hypertension secondary to endocrine disorders: Secondary | ICD-10-CM

## 2018-11-27 DIAGNOSIS — C801 Malignant (primary) neoplasm, unspecified: Secondary | ICD-10-CM

## 2018-11-27 HISTORY — DX: Venous insufficiency (chronic) (peripheral): I87.2

## 2018-11-27 HISTORY — DX: Tremor, unspecified: R25.1

## 2018-11-27 LAB — POCT GLYCOSYLATED HEMOGLOBIN (HGB A1C): Hemoglobin A1C: 6.1 % — AB (ref 4.0–5.6)

## 2018-11-27 NOTE — Progress Notes (Signed)
Impression and Recommendations:    1. Type 2 diabetes mellitus with stage 3 chronic kidney disease, without long-term current use of insulin ([place])   2. Hypertension associated with diabetes ([place]) Chronic  3. Cancer (HCC) Chronic  4. Stage 3 chronic kidney disease ([place])   5. Microalbuminuria due to type 2 diabetes mellitus ([place])   6. Mixed diabetic hyperlipidemia associated with type 2 diabetes mellitus (HCC)   7. Venous insufficiency of both lower extremities   8. Tremor of both hands-  both wiht rest and activity; acutely worse   9. Orthostatic hypotension     - Due for full set of blood work in April.  1. Type 2 diabetes mellitus with stage 3 chronic kidney disease, without long-term current use of insulin (HCC) - A1c 6.1, down from 7.0 last check. - Reviewed that 7.0 or under is ideal. - Reviewed risks and benefits of low blood sugar.  - Blood sugar under good control at this time. - Discontinue glimepiride.  See med list below. - Patient tolerating meds well without complication.  Denies S-E  - Counseled patient on pathophysiology of disease and discussed various treatment options, which often includes dietary and lifestyle modifications as first line.  Importance of low carb/ketogenic diet discussed with patient in addition to regular exercise.   - Check FBS and 2 hours after the biggest meal of your day.  Keep log and bring in next OV for my review.   Also, if you ever feel poorly, please check your blood pressure and blood sugar, as one or the other could be the cause of your symptoms.  - Being a diabetic, you need yearly eye and foot exams. Make appt.for diabetic eye exam.  2. Microalbuminuria due to Type 2 Diabetes Mellitus - Urine drawn today for assessment. - Will continue to monitor.  3. Hand Tremors - Worsening Recently, Now Much Worse - Referral for neurology placed today for further evaluation. - Will continue to monitor.  4. Hypertension Associated with  Diabetes - Managed by Cardiology - Cardiology discontinued metoprolol and HCTZ last follow-up, due to patient's orthostatic hypotension and chronic dizziness. - Discussed that cardiology will continue to manage patient's treatment plan. - Patient knows to continue to follow up with cardiology as established.  - Blood pressure remains controlled at this time. - Discussed risks and benefits of low blood pressure at patient's age. - Keep an eye on blood pressure and pulse, and call cardiology if experiencing concerns.  - Continue treatment plan as modified through cardiology.  See med list below. - Patient tolerating meds well without complication.  Denies S-E  5. Bilateral LE Pitting Edema, Foot Swelling - Advised patient to elevate the feet and legs while at home, to help alleviate swelling. - Utilize compression stockings PRN for relief if able.  - Encouraged patient to exercise and move around more. - Discussed the importance of eating less salt, to help reduce swelling.  - Will continue to monitor.  6. Dermatology - Advised patient to think about asking his dermatologist if a referral for a second opinion is recommended for his chronic concerns.  7. Mixed diabetic hyperlipidemia associated with type 2 diabetes mellitus (HCC) - Stable and WNL last check. - Continue treatment plan as prescribed. - Will continue to monitor.  8. Lifestyle & Preventative Health Maintenance - Advised patient to continue working toward exercising to improve overall mental, physical, and emotional health.    American Heart Association guidelines for healthy diet, basically Mediterranean  diet, and exercise guidelines of 30 minutes 5 days per week or more discussed in detail.  Health counseling performed.  All questions answered.  - Reviewed the "spokes of the wheel" of mood and health management.  Stressed the importance of ongoing prudent habits, including regular exercise, appropriate sleep hygiene,  healthful dietary habits, and prayer/meditation to relax.  - Encouraged patient to engage in daily physical activity as tolerated, especially a formal exercise routine.  Recommended that the patient eventually strive for at least 150 minutes of moderate cardiovascular activity per week according to guidelines established by the [facility].   - Healthy dietary habits encouraged, including low-carb, and high amounts of lean protein in diet.   - Patient should also consume adequate amounts of water.   Education and routine counseling performed. Handouts provided.   Orders Placed This Encounter  Procedures  . Ambulatory referral to Neurology  . POCT glycosylated hemoglobin (Hb A1C)  . HM Diabetes Foot Exam    Medications Discontinued During This Encounter  Medication Reason  . glimepiride (AMARYL) 1 MG tablet       No orders of the defined types were placed in this encounter.   The patient was counseled, risk factors were discussed, anticipatory guidance given.  Gross side effects, risk and benefits, and alternatives of medications discussed with patient.  Patient is aware that all medications have potential side effects and we are unable to predict every side effect or drug-drug interaction that may occur.  Expresses verbal understanding and consents to current therapy plan and treatment regimen.   Return for 2) DM, HTN, HLD follow up every 32mo.   Please see AVS handed out to patient at the end of our visit for further patient instructions/ counseling done pertaining to today's office visit.    Note:  This document was prepared using Dragon voice recognition software and may include unintentional dictation errors.   This document serves as a record of services personally performed by Mellody Dance, DO. It was created on her behalf by Toni Amend, a trained medical scribe. The creation of this record is based on the scribe's personal observations and the provider's statements to  them.   I have reviewed the above medical documentation for accuracy and completeness and I concur.  Mellody Dance, DO 11/28/2018 3:38 PM        Subjective:    Chief Complaint  Patient presents with  . Follow-up     George Barnett is a 85 y.o. male who presents to [place] [facility] today for Diabetes Management.    Patient confirms that his mood is good.  Sleep Habits Denies issues sleeping, but states he does sleep with his mouth open at night, which causes him to have dry mouth.  Foot Swelling & Heart Management through Cardiology Has been experiencing foot swelling for a couple of months.  Followed up with cardiology.  His regular cardiologist is Dr. Wynonia Lawman, but Dr. Wynonia Lawman is off on medical leave.  Dr. Agustin Cree is patient's replacement cardiologist.  Says he had low blood pressure at that time.  He had a few medications changed due to his low blood pressure and dizziness recently.  States that his dizziness was getting worse and more frequent.  Was taken off of metoprolol and HCTZ.  Was not placed on any medication for his lower extremity edema or foot swelling.  "[The doctor] didn't seem concerned about it."  Hand Tremors Dr. Posey Pronto manages his costochondritis.  Patient has never seen  a neurologist for his tremor.  Experiencing the tremor mostly in his hands.  He confirms that he experiences this tremor both while resting and while doing activities.  Still seeing dermatologist for his scalp.  Says "it just doesn't seem to want to dry up."  Notes "there's a place with bacterial infection."  "It gets to a point where I feel like it stops and reverses."  Has tried prednisone, antibiotic, four different ointments.  DM HPI: -  He has been working on diet and exercise for diabetes.  Pt is currently maintained on the following medications for diabetes:   see med list today Medication compliance - Continues on treatment plan as recommended.  Home  glucose readings range: Less than 110 almost consistently, "In fact it was 90-something this morning."  Denies shakiness or symptoms of low blood sugar.  Had one occasion where he felt faint yesterday.   Denies polyuria/polydipsia. Denies hypo/ hyperglycemia symptoms - He denies new onset of: chest pain, exercise intolerance, shortness of breath, dizziness, visual changes, headache, lower extremity swelling or claudication.   Last diabetic eye exam was  Lab Results  Component Value Date   HMDIABEYEEXA No Retinopathy 04/16/2018    Foot exam- UTD  Last A1C in the office was:  Lab Results  Component Value Date   HGBA1C 6.1 (A) 11/27/2018   HGBA1C 7.0 (A) 07/27/2018   HGBA1C 6.8 03/16/2018    Lab Results  Component Value Date   MICROALBUR 10 07/15/2017   LDLCALC 46 03/17/2018   CREATININE 1.36 (H) 08/25/2018    1. HTN HPI:  -  His blood pressure has largely been controlled at home.  Pt is regularly checking it at home.  Patient's blood pressure at home, on average, mean is 140/87.  Blood pressure in general ranges from 103-173 over 69-97.  Cardiology recently took him off of his metoprolol and HCTZ due to his orthostatic hypotension and chronic dizziness.  - Patient reports good compliance with blood pressure medications  - Denies medication S-E   - Smoking Status noted   - He denies new onset of: chest pain, exercise intolerance, shortness of breath, dizziness, visual changes, headache, lower extremity swelling or claudication.   Denies breathing difficulties at night or problems lying flat.  Last 3 blood pressure readings in our office are as follows: BP Readings from Last 3 Encounters:  11/27/18 140/80  11/05/18 (!) 160/82  10/12/18 110/68    Filed Weights   11/27/18 1027  Weight: 169 lb 3.2 oz (76.7 kg)    Last 3 blood pressure readings in our office are as follows: BP Readings from Last 3 Encounters:  11/27/18 140/80  11/05/18 (!) 160/82  10/12/18  110/68    BMI Readings from Last 3 Encounters:  11/27/18 22.95 kg/m  11/05/18 22.62 kg/m  10/12/18 22.24 kg/m     No problems updated.    Patient Care Team    Relationship Specialty Notifications Start End  Mellody Dance, DO PCP - General Family Medicine  09/01/17   Jerrell Belfast, MD Consulting Physician Otolaryngology  03/16/18   Jarome Matin, MD Consulting Physician Dermatology  03/16/18   Jacolyn Reedy, MD Consulting Physician Cardiology  07/27/18   Franchot Gallo, MD Consulting Physician Urology  07/27/18   Jamse Arn, MD Consulting Physician Physical Medicine and Rehabilitation  07/27/18   Deneise Lever, MD Consulting Physician Pulmonary Disease  07/27/18   Park Liter, MD Consulting Physician Cardiology  11/27/18      Patient  Active Problem List   Diagnosis Date Noted  . Hypertension associated with diabetes ([place]) 08/05/2017    Priority: High  . Mixed diabetic hyperlipidemia associated with type 2 diabetes mellitus ([place]) 08/05/2017    Priority: High  . Microalbuminuria due to type 2 diabetes mellitus ([place]) 08/05/2017    Priority: High  . Type 2 diabetes mellitus with stage 3 chronic kidney disease, without long-term current use of insulin ([place]) 05/05/2017    Priority: High  . Venous insufficiency of both lower extremities 11/27/2018  . Tremor of both hands-  both wiht rest and activity; acutely worse 11/27/2018  . Orthostatic hypotension 11/05/2018  . Cancer ([place]) 07/27/2018  . Folliculitis- scalp; txed by Derm- Dr Jarome Matin 07/27/2018  . Bilateral lower extremity edema 11/17/2017  . RLS (restless legs syndrome) 06/17/2017  . GERD (gastroesophageal reflux disease) 05/05/2017  . Glossodynia 05/05/2017  . Costochondritis 05/05/2017  . Stage 3 chronic kidney disease ([place]) 04/22/2017  . Abdominal pain 03/25/2017  . CAD (coronary artery disease) 03/25/2017  . Diabetes mellitus without complication ([place]) 56/31/4970  . Snoring 03/25/2017  .  Urinary retention due to benign prostatic hyperplasia 01/25/2015  . Primary osteoarthritis of right shoulder 01/24/2015  . Edema, peripheral 11/22/2014     Past Medical History:  Diagnosis Date  . Cancer [facility])    SKIN CANCER   . Cataract   . Diabetes mellitus without complication ([place])   . Elbow effusion, left   . Frequency of urination   . GERD (gastroesophageal reflux disease)   . Heart attack ([place])   . Heart disease   . Hypertension   . Primary osteoarthritis of right shoulder 01/24/2015  . Prostate enlargement   . Tremors of nervous system   . Urinary retention due to benign prostatic hyperplasia 01/25/2015     Past Surgical History:  Procedure Laterality Date  . CARDIAC CATHETERIZATION     1999  . cardio stint    . HERNIA REPAIR     BILAT   . MOHS SURGERY     BILAT EARS   . PROSTATE BIOPSY    . TOTAL SHOULDER ARTHROPLASTY Right 01/24/2015   Procedure: RIGHT TOTAL SHOULDER ARTHROPLASTY;  Surgeon: Marchia Bond, MD;  Location: [facility];  Service: Orthopedics;  Laterality: Right;     Family History  Problem Relation Age of Onset  . Hypertension Brother      Social History   Substance and Sexual Activity  Drug Use No  ,  Social History   Substance and Sexual Activity  Alcohol Use No  ,  Social History   Tobacco Use  Smoking Status Never Smoker  Smokeless Tobacco Never Used  ,    Current Outpatient Medications on File Prior to Visit  Medication Sig Dispense Refill  . aspirin 81 MG tablet Take 81 mg by mouth daily.    Marland Kitchen atorvastatin (LIPITOR) 10 MG tablet Take 1-1.5 tablets (10-15 mg total) by mouth daily. Mon Wed Fri 15 mg Sun Tu Th Sa 10 mg 90 tablet 1  . Cobalamine Combinations (Y-63 + FOLIC ACID PO) Take by mouth.    . losartan (COZAAR) 25 MG tablet TAKE 1 TABLET DAILY 90 tablet 4  . metFORMIN (GLUCOPHAGE) 500 MG tablet TAKE 1 TABLET IN THE MORNING AND 1 TABLET IN THE EVENING 180 tablet 4  . mupirocin ointment (BACTROBAN) 2 % Place 1 application into  the nose 2 (two) times daily.     No current facility-administered medications on file prior to visit.  Allergies  Allergen Reactions  . Exenatide     nausea  . Meperidine And Related     Red streak, that happened once while having an IV  . Nadolol     Numbness in fingers  . Sitagliptin Phosphate [Sitagliptin]     Weight loss      Review of Systems:   General:  Denies fever, chills Optho/Auditory:   Denies visual changes, blurred vision Respiratory:   Denies SOB, cough, wheeze, DIB  Cardiovascular:   Denies chest pain, palpitations, painful respirations Gastrointestinal:   Denies nausea, vomiting, diarrhea.  Endocrine:     Denies new hot or cold intolerance Musculoskeletal:  Denies joint swelling, gait issues, or new unexplained myalgias/ arthralgias Skin:  Denies rash, suspicious lesions  Neurological:    Denies dizziness, unexplained weakness, numbness  Psychiatric/Behavioral:   Denies mood changes    Objective:     Blood pressure 140/80, pulse 77, temperature 97.6 F (36.4 C), height 6' (1.829 m), weight 169 lb 3.2 oz (76.7 kg), SpO2 99 %.  Body mass index is 22.95 kg/m.  General: Well Developed, well nourished, and in no acute distress.  HEENT: Normocephalic, atraumatic, pupils equal round reactive to light, neck supple, No carotid bruits, no JVD Skin: Warm and dry, cap RF less 2 sec Cardiac: Regular rate and rhythm, S1, S2 WNL's, no murmurs rubs or gallops Respiratory: ECTA B/L, Not using accessory muscles, speaking in full sentences. NeuroM-Sk: Ambulates w/o assistance, moves ext * 4 w/o difficulty, sensation grossly intact.  Ext: scant edema b/l lower ext Psych: No HI/SI, judgement and insight good, Euthymic mood. Full Affect. Bilateral LE: Significant bilateral pitting edema up to the knees.

## 2018-11-27 NOTE — Patient Instructions (Addendum)
Please keep your feet up above the level of your heart when you are sitting around.  Eat less salt and please become more active so that we will increase and encourage blood flow return to the heart from your lower extremities.  Please see the handout on muscle floss bands which you might find more helpful to wrap around the feet starting at the toes and working your way up towards the knees to compress the lower extremity and improve blood flow return to the heart.  This may be easier to use than compression stockings.  -Please continue to monitor your blood pressure and pulse and follow-up with cardiology as they indicated to you for management of that    Chronic Venous Insufficiency Chronic venous insufficiency, also called venous stasis, is a condition that prevents blood from being pumped effectively through the veins in your legs. Blood may no longer be pumped effectively from the legs back to the heart. This condition can range from mild to severe. With proper treatment, you should be able to continue with an active life. What are the causes? Chronic venous insufficiency occurs when the vein walls become stretched, weakened, or damaged, or when valves within the vein are damaged. Some common causes of this include:  High blood pressure inside the veins (venous hypertension).  Increased blood pressure in the leg veins from long periods of sitting or standing.  A blood clot that blocks blood flow in a vein (deep vein thrombosis, DVT).  Inflammation of a vein (phlebitis) that causes a blood clot to form.  Tumors in the pelvis that cause blood to back up. What increases the risk? The following factors may make you more likely to develop this condition:  Having a family history of this condition.  Obesity.  Pregnancy.  Living without enough physical activity or exercise (sedentary lifestyle).  Smoking.  Having a job that requires long periods of standing or sitting in one  place.  Being a certain age. Women in their 20s and 15s and men in their 44s are more likely to develop this condition. What are the signs or symptoms? Symptoms of this condition include:  Veins that are enlarged, bulging, or twisted (varicose veins).  Skin breakdown or ulcers.  Reddened or discolored skin on the front of the leg.  Brown, smooth, tight, and painful skin just above the ankle, usually on the inside of the leg (lipodermatosclerosis).  Swelling. How is this diagnosed? This condition may be diagnosed based on:  Your medical history.  A physical exam.  Tests, such as: ? A procedure that creates an image of a blood vessel and nearby organs and provides information about blood flow through the blood vessel (duplex ultrasound). ? A procedure that tests blood flow (plethysmography). ? A procedure to look at the veins using X-ray and dye (venogram). How is this treated? The goals of treatment are to help you return to an active life and to minimize pain or disability. Treatment depends on the severity of your condition, and it may include:  Wearing compression stockings. These can help relieve symptoms and help prevent your condition from getting worse. However, they do not cure the condition.  Sclerotherapy. This is a procedure involving an injection of a material that dissolves damaged veins.  Surgery. This may involve: ? Removing a diseased vein (vein stripping). ? Cutting off blood flow through the vein (laser ablation surgery). ? Repairing a valve. Follow these instructions at home:      Wear compression stockings  as told by your health care provider. These stockings help to prevent blood clots and reduce swelling in your legs.  Take over-the-counter and prescription medicines only as told by your health care provider.  Stay active by exercising, walking, or doing different activities. Ask your health care provider what activities are safe for you and how  much exercise you need.  Drink enough fluid to keep your urine clear or pale yellow.  Do not use any products that contain nicotine or tobacco, such as cigarettes and e-cigarettes. If you need help quitting, ask your health care provider.  Keep all follow-up visits as told by your health care provider. This is important. Contact a health care provider if:  You have redness, swelling, or more pain in the affected area.  You see a red streak or line that extends up or down from the affected area.  You have skin breakdown or a loss of skin in the affected area, even if the breakdown is small.  You get an injury in the affected area. Get help right away if:  You get an injury and an open wound in the affected area.  You have severe pain that does not get better with medicine.  You have sudden numbness or weakness in the foot or ankle below the affected area, or you have trouble moving your foot or ankle.  You have a fever and you have worse or persistent symptoms.  You have chest pain.  You have shortness of breath. Summary  Chronic venous insufficiency, also called venous stasis, is a condition that prevents blood from being pumped effectively through the veins in your legs.  Chronic venous insufficiency occurs when the vein walls become stretched, weakened, or damaged, or when valves within the vein are damaged.  Treatment for this condition depends on how severe your condition is, and it may involve wearing compression stockings or having a procedure.  Make sure you stay active by exercising, walking, or doing different activities. Ask your health care provider what activities are safe for you and how much exercise you need. This information is not intended to replace advice given to you by your health care provider. Make sure you discuss any questions you have with your health care provider. Document Released: 03/10/2007 Document Revised: 09/23/2016 Document Reviewed:  09/23/2016 Elsevier Interactive Patient Education  2019 [employer].

## 2018-12-15 ENCOUNTER — Encounter: Payer: Self-pay | Admitting: Cardiology

## 2018-12-15 ENCOUNTER — Ambulatory Visit (INDEPENDENT_AMBULATORY_CARE_PROVIDER_SITE_OTHER): Payer: Medicare Other | Admitting: Cardiology

## 2018-12-15 VITALS — BP 142/82 | HR 65 | Ht 72.0 in | Wt 164.8 lb

## 2018-12-15 DIAGNOSIS — I10 Essential (primary) hypertension: Secondary | ICD-10-CM

## 2018-12-15 DIAGNOSIS — E119 Type 2 diabetes mellitus without complications: Secondary | ICD-10-CM

## 2018-12-15 DIAGNOSIS — E1159 Type 2 diabetes mellitus with other circulatory complications: Secondary | ICD-10-CM | POA: Diagnosis not present

## 2018-12-15 DIAGNOSIS — I2510 Atherosclerotic heart disease of native coronary artery without angina pectoris: Secondary | ICD-10-CM

## 2018-12-15 DIAGNOSIS — I951 Orthostatic hypotension: Secondary | ICD-10-CM

## 2018-12-15 NOTE — Patient Instructions (Addendum)
Medication Instructions:  Your physician recommends that you continue on your current medications as directed. Please refer to the Current Medication list given to you today.  If you need a refill on your cardiac medications before your next appointment, please call your pharmacy.   Lab work: None  If you have labs (blood work) drawn today and your tests are completely normal, you will receive your results only by: Marland Kitchen MyChart Message (if you have MyChart) OR . A paper copy in the mail If you have any lab test that is abnormal or we need to change your treatment, we will call you to review the results.  Testing/Procedures: None  Follow-Up: At [facility], you and your health needs are our priority.  As part of our continuing mission to provide you with exceptional heart care, we have created designated Provider Care Teams.  These Care Teams include your primary Cardiologist (physician) and Advanced Practice Providers (APPs -  Physician Assistants and Nurse Practitioners) who all work together to provide you with the care you need, when you need it. You will need a follow up appointment in 4 months.  Please call our office 2 months in advance to schedule this appointment.

## 2018-12-15 NOTE — Progress Notes (Signed)
Cardiology Office Note:    Date:  12/15/2018   ID:  George Barnett, DOB [birth year], MRN [id number]  PCP:  Mellody Dance, DO  Cardiologist:  Jenne Campus, MD    Referring MD: Mellody Dance, DO   Chief Complaint  Patient presents with  . 2 month follow up  Doing better  History of Present Illness:    George Barnett is a 85 y.o. male with coronary artery disease, diabetes, orthostatic hypotension.  Gradually to reduce the medications to help with orthostatic hypotension he wear elastic stockings he drinks plenty of fluid he eats a little more salty food and things are much better right now he said since then seen him last time he had only one episode of dizziness when he was drinking daily but overall significantly better the biggest complaint that he has is chronic skin condition on his head.  Past Medical History:  Diagnosis Date  . Cancer [facility])    SKIN CANCER   . Cataract   . Diabetes mellitus without complication ([place])   . Elbow effusion, left   . Frequency of urination   . GERD (gastroesophageal reflux disease)   . Heart attack ([place])   . Heart disease   . Hypertension   . Primary osteoarthritis of right shoulder 01/24/2015  . Prostate enlargement   . Tremors of nervous system   . Urinary retention due to benign prostatic hyperplasia 01/25/2015    Past Surgical History:  Procedure Laterality Date  . CARDIAC CATHETERIZATION     1999  . cardio stint    . HERNIA REPAIR     BILAT   . MOHS SURGERY     BILAT EARS   . PROSTATE BIOPSY    . TOTAL SHOULDER ARTHROPLASTY Right 01/24/2015   Procedure: RIGHT TOTAL SHOULDER ARTHROPLASTY;  Surgeon: Marchia Bond, MD;  Location: [place];  Service: Orthopedics;  Laterality: Right;    Current Medications: Current Meds  Medication Sig  . aspirin 81 MG tablet Take 81 mg by mouth daily.  Marland Kitchen atorvastatin (LIPITOR) 10 MG tablet Take 1-1.5 tablets (10-15 mg total) by mouth daily. Mon Wed Fri 15 mg Sun Tu Th Sa 10 mg  . Cobalamine  Combinations (N-05 + FOLIC ACID PO) Take by mouth.  . losartan (COZAAR) 25 MG tablet TAKE 1 TABLET DAILY  . metFORMIN (GLUCOPHAGE) 500 MG tablet TAKE 1 TABLET IN THE MORNING AND 1 TABLET IN THE EVENING  . mupirocin ointment (BACTROBAN) 2 % Place 1 application into the nose 2 (two) times daily.     Allergies:   Exenatide; Meperidine and related; Nadolol; and Sitagliptin phosphate [sitagliptin]   Social History   Socioeconomic History  . Marital status: Married    Spouse name: Not on file  . Number of children: Not on file  . Years of education: Not on file  . Highest education level: Not on file  Occupational History  . Not on file  Social Needs  . Financial resource strain: Not on file  . Food insecurity:    Worry: Not on file    Inability: Not on file  . Transportation needs:    Medical: Not on file    Non-medical: Not on file  Tobacco Use  . Smoking status: Never Smoker  . Smokeless tobacco: Never Used  Substance and Sexual Activity  . Alcohol use: No  . Drug use: No  . Sexual activity: Not on file  Lifestyle  . Physical activity:    Days per week: Not on file  Minutes per session: Not on file  . Stress: Not on file  Relationships  . Social connections:    Talks on phone: Not on file    Gets together: Not on file    Attends religious service: Not on file    Active member of club or organization: Not on file    Attends meetings of clubs or organizations: Not on file    Relationship status: Not on file  Other Topics Concern  . Not on file  Social History Narrative  . Not on file     Family History: The patient's family history includes Hypertension in his brother. ROS:   Please see the history of present illness.    All 14 point review of systems negative except as described per history of present illness  EKGs/Labs/Other Studies Reviewed:      Recent Labs: 03/17/2018: ALT 13; TSH 1.790 08/25/2018: BUN 23; Creatinine, Ser 1.36; NT-Pro BNP 368; Potassium  4.3; Sodium 133  Recent Lipid Panel    Component Value Date/Time   CHOL 105 03/17/2018 0926   TRIG 52 03/17/2018 0926   HDL 49 03/17/2018 0926   CHOLHDL 2.1 03/17/2018 0926   LDLCALC 46 03/17/2018 0926    Physical Exam:    VS:  BP (!) 142/82   Pulse 65   Ht 6' (1.829 m)   Wt 164 lb 12.8 oz (74.8 kg)   SpO2 93%   BMI 22.35 kg/m     Wt Readings from Last 3 Encounters:  12/15/18 164 lb 12.8 oz (74.8 kg)  11/27/18 169 lb 3.2 oz (76.7 kg)  11/05/18 166 lb 12.8 oz (75.7 kg)     GEN:  Well nourished, well developed in no acute distress HEENT: Normal NECK: No JVD; No carotid bruits LYMPHATICS: No lymphadenopathy CARDIAC: RRR, no murmurs, no rubs, no gallops RESPIRATORY:  Clear to auscultation without rales, wheezing or rhonchi  ABDOMEN: Soft, non-tender, non-distended MUSCULOSKELETAL:  No edema; No deformity  SKIN: Warm and dry LOWER EXTREMITIES: no swelling NEUROLOGIC:  Alert and oriented x 3 PSYCHIATRIC:  Normal affect   ASSESSMENT:    1. Orthostatic hypotension   2. Coronary artery disease involving native coronary artery of native heart without angina pectoris   3. Hypertension associated with diabetes ([place])   4. Diabetes mellitus without complication ([place])    PLAN:    In order of problems listed above:  1. Orthostatic hypotension much improved with discontinuation of some of the medication sadly some of this medication were beneficial from cardiac standpoint review but I think overall I prefer him not to have significant orthostatic hypotension and falls. 2. Essential hypertension blood pressure will be elevated for now but I am ready to accept this to prevent him from having orthostatic hypotension 3. Diabetes followed by internal medicine team. 4. Coronary artery disease stable   Medication Adjustments/Labs and Tests Ordered: Current medicines are reviewed at length with the patient today.  Concerns regarding medicines are outlined above.  No orders of the  defined types were placed in this encounter.  Medication changes: No orders of the defined types were placed in this encounter.   Signed, Park Liter, MD, [facility] 12/15/2018 3:10 PM    [place]

## 2018-12-25 DIAGNOSIS — Z85828 Personal history of other malignant neoplasm of skin: Secondary | ICD-10-CM | POA: Diagnosis not present

## 2018-12-25 DIAGNOSIS — D485 Neoplasm of uncertain behavior of skin: Secondary | ICD-10-CM | POA: Diagnosis not present

## 2018-12-25 DIAGNOSIS — C44629 Squamous cell carcinoma of skin of left upper limb, including shoulder: Secondary | ICD-10-CM | POA: Diagnosis not present

## 2018-12-25 DIAGNOSIS — L720 Epidermal cyst: Secondary | ICD-10-CM | POA: Diagnosis not present

## 2018-12-25 DIAGNOSIS — C4442 Squamous cell carcinoma of skin of scalp and neck: Secondary | ICD-10-CM | POA: Diagnosis not present

## 2018-12-25 DIAGNOSIS — L570 Actinic keratosis: Secondary | ICD-10-CM | POA: Diagnosis not present

## 2018-12-28 ENCOUNTER — Ambulatory Visit: Payer: Self-pay | Admitting: Surgery

## 2018-12-28 DIAGNOSIS — K4091 Unilateral inguinal hernia, without obstruction or gangrene, recurrent: Secondary | ICD-10-CM | POA: Diagnosis not present

## 2018-12-28 NOTE — H&P (Signed)
George Barnett Documented: 12/28/2018 9:51 AM Location: [facility] Patient #: [id number] DOB: Mar 02, 1933 Married / Language: [name] / Race: White Male  History of Present Illness George Barnett; 12/28/2018 10:54 AM) Patient words: Patient is self-referred dual bulge in his right groin. He has a history of bilateral inguinal hernia repair. The first was when he was 85 years old. He was not sure which side this was all below. His most recent repair was within the last 5 years. He developed a mildly painful bulge in his right groin. This pops in and pops out without difficulty. He denies any nausea or vomiting. He does have benign prostatic hypertrophy and some voiding issues which are mildly states.  The patient is a 85 year old male.   Past Surgical History Emeline Gins, [place]; 12/28/2018 9:51 AM) Cataract Surgery Bilateral. Open Inguinal Hernia Surgery Bilateral. Shoulder Surgery Bilateral. TURP  Diagnostic Studies History Emeline Gins, CMA; 12/28/2018 9:51 AM) Colonoscopy 5-10 years ago  Allergies Emeline Gins, CMA; 12/28/2018 9:52 AM) No Known Drug Allergies [12/28/2018]: Allergies Reconciled  Medication History Emeline Gins, CMA; 12/28/2018 9:53 AM) Aspirin (81MG  Tablet, Oral) Active. Atorvastatin Calcium (10MG  Tablet, Oral) Active. Losartan Potassium (25MG  Tablet, Oral) Active. metFORMIN HCl (500MG  Tablet, Oral) Active. Medications Reconciled  Social History Emeline Gins, [place]; 12/28/2018 9:51 AM) Alcohol use Occasional alcohol use. Caffeine use Coffee, Tea. No drug use Tobacco use Never smoker.  Family History Emeline Gins, [place]; 12/28/2018 9:51 AM) Arthritis Mother. Breast Cancer Mother. Heart Disease Father, Mother. Heart disease in male family member before age 71 Hypertension Mother. Migraine Headache Daughter, Son.  Other Problems Emeline Gins, CMA; 12/28/2018 9:51 AM) Arthritis Chest  pain Diabetes Mellitus Enlarged Prostate High blood pressure Hypercholesterolemia Inguinal Hernia Melanoma Myocardial infarction     Review of Systems Emeline Gins CMA; 12/28/2018 9:51 AM) General Not Present- Appetite Loss, Chills, Fatigue, Fever, Night Sweats, Weight Gain and Weight Loss. Skin Present- Non-Healing Wounds. Not Present- Change in Wart/Mole, Dryness, Hives, Jaundice, New Lesions, Rash and Ulcer. HEENT Present- Hearing Loss. Not Present- Earache, Hoarseness, Nose Bleed, Oral Ulcers, Ringing in the Ears, Seasonal Allergies, Sinus Pain, Sore Throat, Visual Disturbances, Wears glasses/contact lenses and Yellow Eyes. Respiratory Not Present- Bloody sputum, Chronic Cough, Difficulty Breathing, Snoring and Wheezing. Breast Not Present- Breast Mass, Breast Pain, Nipple Discharge and Skin Changes. Cardiovascular Present- Swelling of Extremities. Not Present- Chest Pain, Difficulty Breathing Lying Down, Leg Cramps, Palpitations, Rapid Heart Rate and Shortness of Breath. Gastrointestinal Present- Constipation. Not Present- Abdominal Pain, Bloating, Bloody Stool, Change in Bowel Habits, Chronic diarrhea, Difficulty Swallowing, Excessive gas, Gets full quickly at meals, Hemorrhoids, Indigestion, Nausea, Rectal Pain and Vomiting. Male Genitourinary Present- Frequency and Nocturia. Not Present- Blood in Urine, Change in Urinary Stream, Impotence, Painful Urination, Urgency and Urine Leakage. Musculoskeletal Not Present- Back Pain, Joint Pain, Joint Stiffness, Muscle Pain, Muscle Weakness and Swelling of Extremities. Neurological Present- Tremor. Not Present- Decreased Memory, Fainting, Headaches, Numbness, Seizures, Tingling, Trouble walking and Weakness. Psychiatric Not Present- Anxiety, Bipolar, Change in Sleep Pattern, Depression, Fearful and Frequent crying. Endocrine Not Present- Cold Intolerance, Excessive Hunger, Hair Changes, Heat Intolerance, Hot flashes and New  Diabetes. Hematology Present- Easy Bruising and Persistent Infections. Not Present- Blood Thinners, Excessive bleeding, Gland problems and HIV.  Vitals Emeline Gins CMA; 12/28/2018 9:52 AM) 12/28/2018 9:52 AM Weight: 159 lb Height: 70in Body Surface Area: 1.89 m Body Mass Index: 22.81 kg/m  Temp.: 97.45F  Pulse: 91 (Regular)  BP: 162/80 (Sitting, Left Arm, Standard)  Physical Exam (George Whitcher A. Minahil Quinlivan Barnett; 12/28/2018 10:56 AM)  Head and Neck Head-normocephalic, atraumatic with no lesions or palpable masses. Trachea-midline. Thyroid Gland Characteristics - normal size and consistency.  Chest and Lung Exam Chest and lung exam reveals -quiet, even and easy respiratory effort with no use of accessory muscles and on auscultation, normal breath sounds, no adventitious sounds and normal vocal resonance. Inspection Chest Wall - Normal. Back - normal.  Cardiovascular Note: Regular rhythm without rub murmur gallop  Abdomen Note: Scars noted in both groins. Small reducible right inguinal hernia noted. No evidence of left inguinal hernia.  Neurologic Note: Baseline tremor at rest of both hands    Assessment & Plan (George Dzik A. Winifred Balogh Barnett; 12/28/2018 10:21 AM)  RECURRENT RIGHT INGUINAL HERNIA (K40.91)  Current Plans You are being scheduled for surgery- Our schedulers will call you.  You should hear from our office's scheduling department within 5 working days about the location, date, and time of surgery. We try to make accommodations for patient's preferences in scheduling surgery, but sometimes the OR schedule or the surgeon's schedule prevents [place] from making those accommodations.  If you have not heard from our office [phone]) in 5 working days, call the office and ask for your surgeon's nurse.  If you have other questions about your diagnosis, plan, or surgery, call the office and ask for your surgeon's nurse.  Pt Education - Pamphlet Given -  Hernia Surgery: discussed with patient and provided information. The anatomy & physiology of the abdominal wall and pelvic floor was discussed. The pathophysiology of hernias in the inguinal and pelvic region was discussed. Natural history risks such as progressive enlargement, pain, incarceration, and strangulation was discussed. Contributors to complications such as smoking, obesity, diabetes, prior surgery, etc were discussed.  I feel the risks of no intervention will lead to serious problems that outweigh the operative risks; therefore, I recommended surgery to reduce and repair the hernia. I explained an open approach. I noted usual use of mesh to patch and/or buttress hernia repair  Risks such as bleeding, infection, abscess, need for further treatment, heart attack, death, and other risks were discussed. I noted a good likelihood this will help address the problem. Goals of post-operative recovery were discussed as well. Possibility that this will not correct all symptoms was explained. I stressed the importance of low-impact activity, aggressive pain control, avoiding constipation, & not pushing through pain to minimize risk of post-operative chronic pain or injury. Possibility of reherniation was discussed. We will work to minimize complications.  An educational handout further explaining the pathology & treatment options was given as well. Questions were answered. The patient expresses understanding & wishes to proceed with surgery.  Pt Education - CCS Mesh education: discussed with patient and provided information.    KEEP OVERNIGHT DUE TO MEDICAL PROBLEMS

## 2019-01-06 ENCOUNTER — Ambulatory Visit: Payer: Medicare Other | Admitting: Cardiology

## 2019-01-07 ENCOUNTER — Telehealth: Payer: Self-pay | Admitting: Family Medicine

## 2019-01-07 NOTE — Telephone Encounter (Signed)
Last A1c 1 mo ago was at 6.1 and due to his age, and risk of lows are much greater than hgiher BS's.    I rec he cont current txmnt plan and monitor FBS and 2 hr PP.   Write it down and keep log.   Pay attention to what foods drive it up and what foods are good for his BS's.   I rec he eat a prudent diet--> try to cut back on carbs/ sweets and empty calories.  Let us know how they are doing in about 2 wks, or sooner if they keep going up

## 2019-01-07 NOTE — Telephone Encounter (Signed)
Patient is requesting a call back from [facility] about increase in glucose level. He states that Dr. Jenetta Downer had d/c'd one of his meds (he could not recall the name of this med) and since then has had above normal glucose levels that he wants to discuss and make sure he is doing exactly what he should be. Please call patient when available at home number.

## 2019-01-07 NOTE — Telephone Encounter (Signed)
Spoke to the patient he states that he takes his fasting glucose every morning and has noticed in increase in levels since stopping the Glimepiride. Patient states that fasting glucose yesterday was 179 and 174 today - this is the highest they have been.  Patient states that level keeps increasing every few days.  Patient states that he is feeling fine.  Medication was stopped at last office visit on 11/27/2018. Please review and advise. MPulliam, CMA/RT(R)

## 2019-01-11 DIAGNOSIS — H524 Presbyopia: Secondary | ICD-10-CM | POA: Diagnosis not present

## 2019-01-11 DIAGNOSIS — E119 Type 2 diabetes mellitus without complications: Secondary | ICD-10-CM | POA: Diagnosis not present

## 2019-01-11 DIAGNOSIS — H01001 Unspecified blepharitis right upper eyelid: Secondary | ICD-10-CM | POA: Diagnosis not present

## 2019-01-11 DIAGNOSIS — H04123 Dry eye syndrome of bilateral lacrimal glands: Secondary | ICD-10-CM | POA: Diagnosis not present

## 2019-01-12 NOTE — Telephone Encounter (Signed)
Spoke to the patient's wife and notified of the message below.  She and the patient are still concerned and would like to discuss concerns face to face with Dr. Raliegh Scarlet.  Made the patient an appointment for Friday 01/15/2019. MPulliam, CMA/RT(R)

## 2019-01-14 ENCOUNTER — Encounter: Payer: Self-pay | Admitting: Neurology

## 2019-01-14 ENCOUNTER — Ambulatory Visit (INDEPENDENT_AMBULATORY_CARE_PROVIDER_SITE_OTHER): Payer: Medicare Other | Admitting: Neurology

## 2019-01-14 VITALS — BP 152/88 | HR 71 | Ht 73.0 in | Wt 160.0 lb

## 2019-01-14 DIAGNOSIS — G250 Essential tremor: Secondary | ICD-10-CM | POA: Diagnosis not present

## 2019-01-14 DIAGNOSIS — I2510 Atherosclerotic heart disease of native coronary artery without angina pectoris: Secondary | ICD-10-CM

## 2019-01-14 NOTE — Patient Instructions (Signed)
You have a tremor of both hands. I do not see any signs or symptoms of parkinson's like disease or what we call parkinsonism.   For your tremor, I would not recommend any new medication for fear of side effects (especially sleepiness) or medication interactions, especially in light of your diabetes and kidney impairment.  You may be able to use weighted utensils better, to use for eating. Look for weighted utensils online perhaps.  We do not have to make a follow up appointment.   Please remember, that any kind of tremor may be exacerbated by anxiety, anger, nervousness, excitement, dehydration, sleep deprivation, by caffeine, and low blood sugar values or blood sugar fluctuations.   Please discuss stressors with Dr. Raliegh Scarlet, maybe she has some suggestions for you, I know, you may not wan to take any medication for this.   I do wish you well. I would be happy to see you back as needed.

## 2019-01-14 NOTE — Progress Notes (Signed)
Subjective:    Patient ID: George Barnett is a 85 y.o. male.  HPI     Star Age, MD, PhD [facility] [address], [address] P.O. [place], [place] [address]  Dear Dr. Everette Rank,  I saw your patient, George Barnett, upon your kind request in my neurologic clinic today for initial consultation of his hand tremors. The patient is accompanied by his wife today. As you know, George Barnett is an 85 year old right-handed gentleman with an underlying medical history of diabetes, hypertension, chronic kidney disease, hyperlipidemia, venous insufficiency, and history of orthostatic hypotension, who reports a long-standing history of hand tremors for many years. His wife reports that he had hand shaking when they got married which was over 50 years ago. He has no obvious family history of tremors. He has one brother. He has 3 kids. Neither parents had shaking as far as he recalls. He has no family history of Parkinson's disease but has worried about Parkinson's disease. In the past several months his tremor has become significantly worse. 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 He does endorse significant stressors lately, he worries about his wife's health. He has had significant issues with skin cancers and folliculitis. He has to have hernia surgery. He worries about his wife also, she had a stroke and she may need knee surgery. He has recently stopped taking glimepiride and has been keeping a log on his blood sugar values. His wife endorses that his blood sugar numbers have been higher. He has not had any recent low blood sugar values but does have jitteriness when he has low blood sugar and sweatiness on his forehead with low blood sugar. His hand tremors of bilateral, has affected his ability to drink from a cup and he avoids drinking hot beverages unless there is a limit on the cup. The tremor also interferes  with his ability to write and to feed himself. He is a nonsmoker and does not utilize any alcohol, drinks caffeine in the form of coffee, 1 cup daily and [facility] with lunch.  His Past Medical History Is Significant For: Past Medical History:  Diagnosis Date  . Cancer [facility])    SKIN CANCER   . Cataract   . Diabetes mellitus without complication ([place])   . Elbow effusion, left   . Frequency of urination   . GERD (gastroesophageal reflux disease)   . Heart attack ([place])   . Heart disease   . Hypertension   . Primary osteoarthritis of right shoulder 01/24/2015  . Prostate enlargement   . Tremors of nervous system   . Urinary retention due to benign prostatic hyperplasia 01/25/2015    His Past Surgical History Is Significant For: Past Surgical History:  Procedure Laterality Date  . CARDIAC CATHETERIZATION     1999  . cardio stint    . HERNIA REPAIR     BILAT   . MOHS SURGERY     BILAT EARS   . PROSTATE BIOPSY    . TOTAL SHOULDER ARTHROPLASTY Right 01/24/2015   Procedure: RIGHT TOTAL SHOULDER ARTHROPLASTY;  Surgeon: Marchia Bond, MD;  Location: [facility];  Service: Orthopedics;  Laterality: Right;    His Family History Is Significant For: Family History  Problem Relation Age of Onset  . Hypertension Brother     His Social History Is Significant For: Social History   Socioeconomic History  . Marital status: Married  Spouse name: Not on file  . Number of children: Not on file  . Years of education: Not on file  . Highest education level: Not on file  Occupational History  . Not on file  Social Needs  . Financial resource strain: Not on file  . Food insecurity:    Worry: Not on file    Inability: Not on file  . Transportation needs:    Medical: Not on file    Non-medical: Not on file  Tobacco Use  . Smoking status: Never Smoker  . Smokeless tobacco: Never Used  Substance and Sexual Activity  . Alcohol use: No  . Drug use: No  . Sexual activity: Not on file   Lifestyle  . Physical activity:    Days per week: Not on file    Minutes per session: Not on file  . Stress: Not on file  Relationships  . Social connections:    Talks on phone: Not on file    Gets together: Not on file    Attends religious service: Not on file    Active member of club or organization: Not on file    Attends meetings of clubs or organizations: Not on file    Relationship status: Not on file  Other Topics Concern  . Not on file  Social History Narrative  . Not on file    His Allergies Are:  Allergies  Allergen Reactions  . Exenatide     nausea  . Meperidine And Related     Red streak, that happened once while having an IV  . Nadolol     Numbness in fingers  . Sitagliptin Phosphate [Sitagliptin]     Weight loss   :   His Current Medications Are:  Outpatient Encounter Medications as of 01/14/2019  Medication Sig  . aspirin 81 MG tablet Take 81 mg by mouth daily.  Marland Kitchen atorvastatin (LIPITOR) 10 MG tablet Take 1-1.5 tablets (10-15 mg total) by mouth daily. Mon Wed Fri 15 mg Sun Tu Th Sa 10 mg  . Cobalamine Combinations (T-06 + FOLIC ACID PO) Take by mouth.  . losartan (COZAAR) 25 MG tablet TAKE 1 TABLET DAILY  . metFORMIN (GLUCOPHAGE) 500 MG tablet TAKE 1 TABLET IN THE MORNING AND 1 TABLET IN THE EVENING  . mupirocin ointment (BACTROBAN) 2 % Place 1 application into the nose 2 (two) times daily.   No facility-administered encounter medications on file as of 01/14/2019.   :   Review of Systems:  Out of a complete 14 point review of systems, all are reviewed and negative with the exception of these symptoms as listed below:  Review of Systems  Neurological:       Pt presents today to discuss his bilateral hand tremors. Pt has had tremors for years but they recently have gotten worse. Pt is right handed.    Objective:  Neurological Exam  Physical Exam Physical Examination:   Vitals:   01/14/19 1444  BP: (!) 152/88  Pulse: 71  SpO2: 100%     General Examination: The patient is a very pleasant 85 y.o. male in no acute distress. He appears well-developed and well-nourished and well groomed. He is mildly anxious appearing.  HEENT: Normocephalic, atraumatic, pupils are equal, round and reactive to light and accommodation.  Extraocular tracking is well preserved. Hearing is grossly intact. He has bilateral hearing aids. He has no lip, neck or jaw tremor. He has no significant no cough rigidity. Airway examination reveals nonfocal findings, mild  mouth dryness is noted. Tongue protrudes centrally and palate elevates symmetrically. He has no significant hypophonia or dysarthria. No carotid bruits.  Chest: Clear to auscultation without wheezing, rhonchi or crackles noted.  Heart: S1+S2+0, regular and normal without murmurs, rubs or gallops noted.   Abdomen: Soft, non-tender and non-distended with normal bowel sounds appreciated on auscultation.  Extremities: There is no pitting edema in the distal lower extremities bilaterally.   Skin: Warm and dry, with chronic appearing bruising and skin lesions on the scalp.  Musculoskeletal: exam reveals no obvious joint deformities, tenderness or joint swelling or erythema.   Neurologically:  Mental status: The patient is awake, alert and oriented in all 4 spheres. His immediate and remote memory, attention, language skills and fund of knowledge are appropriate. There is no evidence of aphasia, agnosia, apraxia or anomia. Speech is clear with normal prosody and enunciation. Thought process is linear. Mood is normal and affect is normal.  Cranial nerves II - XII are as described above under HEENT exam. In addition: shoulder shrug is normal with equal shoulder height noted. Motor exam: thin bulk, global strength is normal, he has a normal tone. He has a slight degree of resting tremor intermittently in both hands. He has a mild to moderate postural tremor in both hands and a mild action tremor in both  hands, no significant lateralization.   On 01/14/2019: On Archimedes spiral drawing he has slight trembling with the right hand which is his dominant hand, with the left hand he has moderate trembling. Handwriting with the right hand is difficult to read, on the smaller side and tremulous.  Fine motor skill testing: he has no significant decrement in amplitude with finger taps or foot taps. He has global mild fine motor dyscontrol. Cerebellar testing: No dysmetria or intention tremor on finger to nose testing. Heel to shin is unremarkable bilaterally. There is no truncal or gait ataxia.  Sensory exam: intact to light touch.  Gait, station and balance: He stands without difficulty, his posture is slightly stooped but could be appropriate for age, he walks with preserved arm swing, no shuffling. His balance is slightly impaired. He turns cautiously.  Assessment and Plan:    In summary, George Barnett is a very pleasant 85 y.o.-year old male with an underlying medical history of diabetes, hypertension, skin cancer, chronic kidney disease, hyperlipidemia, venous insufficiency, and history of orthostatic hypotension, who presents for evaluation of his hand tremors. He gives a long-standing history of hand tremors with recent exacerbation. On examination, he has a bilateral upper extremity postural and action tremor with a slight resting component. He has no signs of parkinsonism and is reassured in that regard. He does endorse recent increase in stressors,, which would be a contributor to tremor exacerbation. We talked about tremor triggers at length today. We talked about potential symptomatic medications but I would not favor any prescription medications for him for fear of side effects. He has had some fluctuation in his blood sugar values recently. He would not miss early be a good candidate for beta blocker. I would also not favor trying primidone for fear of sedation and balance issues and exacerbation  of his kidney function. He is advised to talk to about stress management and stress reduction, I get a sense, that he does not wish to try any medication for anxiety, we talked about this as well today. He is encouraged to stay well-hydrated. I did not suggest any new diagnostic testing from my end of things. I  would be happy to see him back on an as-needed basis. I answered all their questions today and the patient and his wife were in agreement. Thank you very much for allowing me to participate in the care of this nice patient. If I can be of any further assistance to you please do not hesitate to call me at [phone].  Sincerely,   Star Age, MD, PhD

## 2019-01-15 ENCOUNTER — Ambulatory Visit (INDEPENDENT_AMBULATORY_CARE_PROVIDER_SITE_OTHER): Payer: Medicare Other | Admitting: Family Medicine

## 2019-01-15 ENCOUNTER — Encounter: Payer: Self-pay | Admitting: Family Medicine

## 2019-01-15 VITALS — BP 164/82 | HR 70 | Temp 98.3°F | Ht 73.0 in | Wt 159.8 lb

## 2019-01-15 DIAGNOSIS — Z79899 Other long term (current) drug therapy: Secondary | ICD-10-CM

## 2019-01-15 DIAGNOSIS — E1122 Type 2 diabetes mellitus with diabetic chronic kidney disease: Secondary | ICD-10-CM | POA: Diagnosis not present

## 2019-01-15 DIAGNOSIS — I219 Acute myocardial infarction, unspecified: Secondary | ICD-10-CM | POA: Insufficient documentation

## 2019-01-15 DIAGNOSIS — I2510 Atherosclerotic heart disease of native coronary artery without angina pectoris: Secondary | ICD-10-CM | POA: Diagnosis not present

## 2019-01-15 DIAGNOSIS — E1129 Type 2 diabetes mellitus with other diabetic kidney complication: Secondary | ICD-10-CM

## 2019-01-15 DIAGNOSIS — R809 Proteinuria, unspecified: Secondary | ICD-10-CM

## 2019-01-15 DIAGNOSIS — N183 Chronic kidney disease, stage 3 unspecified: Secondary | ICD-10-CM

## 2019-01-15 HISTORY — DX: Other long term (current) drug therapy: Z79.899

## 2019-01-15 MED ORDER — METFORMIN HCL 500 MG PO TABS: ORAL_TABLET | ORAL | 0 refills | Status: DC

## 2019-01-15 NOTE — Progress Notes (Signed)
Pt here for an acute care OV today  Impression and Recommendations:    1. Type 2 diabetes mellitus with stage 3 chronic kidney disease, without long-term current use of insulin ([place])   2. Microalbuminuria due to type 2 diabetes mellitus (H[facility])   3. Stage 3 chronic kidney disease ([place])   4. High risk medication use      - Scheduled for right hernia repair in the near future (February 10 2019). - Advised patient that he should only undergo surgery if the hernia is symptomatic.  1. Diabetes Mellitus with Acutely Elevated Blood Sugar - Last A1c was taken on 11/27/2018, 6.1. - Per patient, has not been less active and has not been eating poorly.  - Per patient, has had acutely elevated blood sugars, but isn't sure when his sugar is elevated worse.  Feels that his blood sugar has worsened since discontinuing Amaryl/glimepiride on 11/27/2018.  - Discussed goal fasting blood sugars in the 120's, 130's as before.  - Advised increase in metformin today. - Patient's GFR last check was 48.  - Patient will take 500 mg metformin in the morning, and 1000 mg in the evening. - Monitor sugars for at least two weeks.   - If sugars are running at goal, he does not need to increase to 1000 mg BID.  - Patient tolerating meds well without complication.  Denies S-E. - Need for lab re-check in 3-4 weeks after increase in metformin.  - Counseled patient on pathophysiology of disease and discussed various treatment options, which often includes dietary and lifestyle modifications as first line.  Importance of low carb, prudent diet discussed with patient in addition to regular exercise.   - Check FBS and 2 hours after the biggest meal of your day.  Keep log and bring in next OV for my review.  Also, if you ever feel poorly, please check your blood pressure and blood sugar, as one or the other could be the cause of your symptoms.  - Being a diabetic, you need yearly eye and foot exams.  Make appt.for  diabetic eye exam.  - Encouraged patient to exercise regularly, continue prudent lifestyle habits, and drink an adequate amount of water.  2. Blood Pressure Elevated in Office - White Coat Syndrome - Per patient and BP log, blood pressure controlled at home.  - Encouraged patient to continue ambulatory blood pressure monitoring at home.  - Will continue to monitor.   Meds ordered this encounter  Medications  . DISCONTD: metFORMIN (GLUCOPHAGE) 500 MG tablet    Sig: 2 po q am and 2 po q pm ( titrate down per bld sugar)    Dispense:  360 tablet    Refill:  0  . DISCONTD: metFORMIN (GLUCOPHAGE) 500 MG tablet    Sig: 2 po q am and 2 po q pm ( titrate down per bld sugar)    Dispense:  360 tablet    Refill:  0  . metFORMIN (GLUCOPHAGE) 500 MG tablet    Sig: 2 po q am and 2 po q pm ( titrate down per bld sugar)    Dispense:  360 tablet    Refill:  0    Medications Discontinued During This Encounter  Medication Reason  . metFORMIN (GLUCOPHAGE) 500 MG tablet Reorder  . metFORMIN (GLUCOPHAGE) 500 MG tablet Reorder  . metFORMIN (GLUCOPHAGE) 500 MG tablet Reorder     Orders Placed This Encounter  Procedures  . Vitamin B12  . Magnesium  .  Comprehensive metabolic panel  . Hemoglobin A1c  . CBC with Differential/Platelet     Education and routine counseling performed. Handouts provided  Patient will call with any questions prior to using medication if they have concerns.    Expresses verbal understanding and consents to current therapy and treatment regimen.  No barriers to understanding were identified.  Red flag symptoms and signs discussed in detail.  Patient expressed understanding regarding what to do in case of emergency\urgent symptoms   Please see AVS handed out to patient at the end of our visit for further patient instructions/ counseling done pertaining to today's office visit.   Return for 3-4wks, lab only, then 2) OV with me 5 wks- bring in BP and BS log.      Note:  This document was prepared occasionally using Dragon voice recognition software and may include unintentional dictation errors in addition to a scribe.  This document serves as a record of services personally performed by Mellody Dance, DO. It was created on her behalf by Toni Amend, a trained medical scribe. The creation of this record is based on the scribe's personal observations and the provider's statements to them.   I have reviewed the above medical documentation for accuracy and completeness and I concur.  Mellody Dance, DO 01/15/2019 7:13 PM        ------------------------------------------------------------------------------------------------------------------------    Subjective:    CC:  Chief Complaint  Patient presents with  . Hyperglycemia    HPI: George Barnett is a 85 y.o. male who presents to [place] [facility] today for acutely elevated blood sugar.  Recently had squamous cell cancer removed.  Says "it seems like [something new is happening to him] all the time."  His vision in his right eye went down due to astigmatism.  He is also currently scheduled for hernia surgery.  He was recently checked out by a specialist and told he does not have Parkinson's.  Blood Pressure at Home Feels his blood pressure has been fine at home, running in the 130's over 70's.  DM HPI:  -  He has not been working particularly on diet and exercise for diabetes.  He has not been less active and has not been eating poorly.  Noticed his sugar going up when he went off the Amaryl. "When I quit taking that, it started going up."  Pt is currently maintained on the following medications for diabetes: see med list today  Medication compliance - has continued on medications as prescribed.  He discontinued Amaryl and continues taking metformin, 500 in the morning and 500 in the evening.  Home glucose readings range - fasting sugars have  been running in the 150's on average.  Lowest measurement was 125.  Highest fasting sugar was 179.   Denies polyuria/polydipsia.  Denies hypo/ hyperglycemia symptoms  Last diabetic eye exam was  Lab Results  Component Value Date   HMDIABEYEEXA No Retinopathy 04/16/2018    Foot exam- UTD  Last A1C in the office was:  Lab Results  Component Value Date   HGBA1C 6.1 (A) 11/27/2018   HGBA1C 7.0 (A) 07/27/2018   HGBA1C 6.8 03/16/2018    Lab Results  Component Value Date   MICROALBUR 10 07/15/2017   LDLCALC 46 03/17/2018   CREATININE 1.36 (H) 08/25/2018    Problem  High Risk Medication Use     Wt Readings from Last 3 Encounters:  01/15/19 159 lb 12.8 oz (72.5 kg)  01/14/19 160 lb (72.6 kg)  12/15/18 164 lb 12.8 oz (74.8 kg)   BP Readings from Last 3 Encounters:  01/15/19 (!) 164/82  01/14/19 (!) 152/88  12/15/18 (!) 142/82   BMI Readings from Last 3 Encounters:  01/15/19 21.08 kg/m  01/14/19 21.11 kg/m  12/15/18 22.35 kg/m     Patient Care Team    Relationship Specialty Notifications Start End  Mellody Dance, DO PCP - General Family Medicine  09/01/17   Jerrell Belfast, MD Consulting Physician Otolaryngology  03/16/18   Jarome Matin, MD Consulting Physician Dermatology  03/16/18   Jacolyn Reedy, MD Consulting Physician Cardiology  07/27/18   Franchot Gallo, MD Consulting Physician Urology  07/27/18   Jamse Arn, MD Consulting Physician Physical Medicine and Rehabilitation  07/27/18   Deneise Lever, MD Consulting Physician Pulmonary Disease  07/27/18   Park Liter, MD Consulting Physician Cardiology  11/27/18      Patient Active Problem List   Diagnosis Date Noted  . Hypertension associated with diabetes ([place]) 08/05/2017    Priority: High  . Mixed diabetic hyperlipidemia associated with type 2 diabetes mellitus ([place]) 08/05/2017    Priority: High  . Microalbuminuria due to type 2 diabetes mellitus ([place]) 08/05/2017    Priority: High   . Type 2 diabetes mellitus with stage 3 chronic kidney disease, without long-term current use of insulin ([place]) 05/05/2017    Priority: High  . High risk medication use 01/15/2019  . Venous insufficiency of both lower extremities 11/27/2018  . Tremor of both hands-  both wiht rest and activity; acutely worse 11/27/2018  . Orthostatic hypotension 11/05/2018  . Cancer ([place]) 07/27/2018  . Folliculitis- scalp; txed by Derm- Dr Jarome Matin 07/27/2018  . Bilateral lower extremity edema 11/17/2017  . RLS (restless legs syndrome) 06/17/2017  . GERD (gastroesophageal reflux disease) 05/05/2017  . Glossodynia 05/05/2017  . Costochondritis 05/05/2017  . Stage 3 chronic kidney disease ([place]) 04/22/2017  . Abdominal pain 03/25/2017  . CAD (coronary artery disease) 03/25/2017  . Diabetes mellitus without complication ([place]) 25/03/3975  . Snoring 03/25/2017  . Urinary retention due to benign prostatic hyperplasia 01/25/2015  . Primary osteoarthritis of right shoulder 01/24/2015  . Edema, peripheral 11/22/2014      Past Medical History:  Diagnosis Date  . Cancer [facility])    SKIN CANCER   . Cataract   . Diabetes mellitus without complication ([place])   . Elbow effusion, left   . Frequency of urination   . GERD (gastroesophageal reflux disease)   . Heart attack ([place])   . Heart disease   . Hypertension   . Primary osteoarthritis of right shoulder 01/24/2015  . Prostate enlargement   . Tremors of nervous system   . Urinary retention due to benign prostatic hyperplasia 01/25/2015     Past Surgical History:  Procedure Laterality Date  . CARDIAC CATHETERIZATION     1999  . cardio stint    . HERNIA REPAIR     BILAT   . MOHS SURGERY     BILAT EARS   . PROSTATE BIOPSY    . TOTAL SHOULDER ARTHROPLASTY Right 01/24/2015   Procedure: RIGHT TOTAL SHOULDER ARTHROPLASTY;  Surgeon: Marchia Bond, MD;  Location: [place];  Service: Orthopedics;  Laterality: Right;     Family History  Problem Relation Age of  Onset  . Hypertension Brother      Social History   Socioeconomic History  . Marital status: Married    Spouse name: Not on file  . Number of children:  Not on file  . Years of education: Not on file  . Highest education level: Not on file  Occupational History  . Not on file  Social Needs  . Financial resource strain: Not on file  . Food insecurity:    Worry: Not on file    Inability: Not on file  . Transportation needs:    Medical: Not on file    Non-medical: Not on file  Tobacco Use  . Smoking status: Never Smoker  . Smokeless tobacco: Never Used  Substance and Sexual Activity  . Alcohol use: No  . Drug use: No  . Sexual activity: Not on file  Lifestyle  . Physical activity:    Days per week: Not on file    Minutes per session: Not on file  . Stress: Not on file  Relationships  . Social connections:    Talks on phone: Not on file    Gets together: Not on file    Attends religious service: Not on file    Active member of club or organization: Not on file    Attends meetings of clubs or organizations: Not on file    Relationship status: Not on file  . Intimate partner violence:    Fear of current or ex partner: Not on file    Emotionally abused: Not on file    Physically abused: Not on file    Forced sexual activity: Not on file  Other Topics Concern  . Not on file  Social History Narrative  . Not on file     Current Meds  Medication Sig  . aspirin 81 MG tablet Take 81 mg by mouth daily.  Marland Kitchen atorvastatin (LIPITOR) 10 MG tablet Take 1-1.5 tablets (10-15 mg total) by mouth daily. Mon Wed Fri 15 mg Sun Tu Th Sa 10 mg  . Cobalamine Combinations (B-76 + FOLIC ACID PO) Take by mouth.  . losartan (COZAAR) 25 MG tablet TAKE 1 TABLET DAILY  . metFORMIN (GLUCOPHAGE) 500 MG tablet 2 po q am and 2 po q pm ( titrate down per bld sugar)  . mupirocin ointment (BACTROBAN) 2 % Place 1 application into the nose 2 (two) times daily.  . [DISCONTINUED] metFORMIN (GLUCOPHAGE)  500 MG tablet TAKE 1 TABLET IN THE MORNING AND 1 TABLET IN THE EVENING  . [DISCONTINUED] metFORMIN (GLUCOPHAGE) 500 MG tablet 2 po q am and 2 po q pm ( titrate down per bld sugar)  . [DISCONTINUED] metFORMIN (GLUCOPHAGE) 500 MG tablet 2 po q am and 2 po q pm ( titrate down per bld sugar)    Allergies:  Allergies  Allergen Reactions  . Exenatide     nausea  . Meperidine And Related     Red streak, that happened once while having an IV  . Nadolol     Numbness in fingers  . Sitagliptin Phosphate [Sitagliptin]     Weight loss      Review of Systems: General:   Denies fever, chills, unexplained weight loss.  Optho/Auditory:   Denies visual changes, blurred vision/LOV Respiratory:   Denies wheeze, DOE more than baseline levels.   Cardiovascular:   Denies chest pain, palpitations, new onset peripheral edema  Gastrointestinal:   Denies nausea, vomiting, diarrhea, abd pain.  Genitourinary: Denies dysuria, freq/ urgency, flank pain or discharge from genitals.  Endocrine:     Denies hot or cold intolerance, polyuria, polydipsia. Musculoskeletal:   Denies unexplained myalgias, joint swelling, unexplained arthralgias, gait problems.  Skin:  Denies new onset  rash, suspicious lesions Neurological:     Denies dizziness, unexplained weakness, numbness  Psychiatric/Behavioral:   Denies mood changes, suicidal or homicidal ideations, hallucinations    Objective:   Blood pressure (!) 164/82, pulse 70, temperature 98.3 F (36.8 C), height 6\' 1"  (1.854 m), weight 159 lb 12.8 oz (72.5 kg), SpO2 100 %. Body mass index is 21.08 kg/m. General:  Well Developed, well nourished, appropriate for stated age.  Neuro:  Alert and oriented,  extra-ocular muscles intact  HEENT:  Normocephalic, atraumatic, neck supple Skin:  no gross rash, warm, pink. Cardiac:  RRR, S1 S2 Respiratory:  ECTA B/L and A/P, Not using accessory muscles, speaking in full sentences- unlabored. Vascular:  Ext warm, no cyanosis  apprec.; cap RF less 2 sec. Psych:  No HI/SI, judgement and insight good, Euthymic mood. Full Affect.

## 2019-01-15 NOTE — Patient Instructions (Addendum)
Take 500 mg metformin in the morning, and 1000 mg metformin in the evening.  Monitor sugars for at least two weeks.  If sugars are running at goal (fasting sugars in the 120's, 130's), you will not need to increase to 1000 mg twice daily.  -Also we will need to recheck your kidney function in 3 to 4 weeks so please come in for a CMP, mag, a1c, B12 and CBC.  This will also be useful for your upcoming surgery.

## 2019-01-24 ENCOUNTER — Other Ambulatory Visit: Payer: Self-pay | Admitting: Family Medicine

## 2019-01-24 DIAGNOSIS — E782 Mixed hyperlipidemia: Principal | ICD-10-CM

## 2019-01-24 DIAGNOSIS — E1169 Type 2 diabetes mellitus with other specified complication: Secondary | ICD-10-CM

## 2019-01-25 DIAGNOSIS — Z85828 Personal history of other malignant neoplasm of skin: Secondary | ICD-10-CM | POA: Diagnosis not present

## 2019-01-25 DIAGNOSIS — L570 Actinic keratosis: Secondary | ICD-10-CM | POA: Diagnosis not present

## 2019-01-26 ENCOUNTER — Encounter: Payer: Self-pay | Admitting: Family Medicine

## 2019-01-26 DIAGNOSIS — H02051 Trichiasis without entropian right upper eyelid: Secondary | ICD-10-CM | POA: Diagnosis not present

## 2019-01-26 DIAGNOSIS — H04121 Dry eye syndrome of right lacrimal gland: Secondary | ICD-10-CM | POA: Diagnosis not present

## 2019-01-29 ENCOUNTER — Ambulatory Visit: Payer: Medicare Other | Admitting: Cardiology

## 2019-02-01 ENCOUNTER — Other Ambulatory Visit (INDEPENDENT_AMBULATORY_CARE_PROVIDER_SITE_OTHER): Payer: Medicare Other

## 2019-02-01 ENCOUNTER — Other Ambulatory Visit: Payer: Self-pay

## 2019-02-01 DIAGNOSIS — E1129 Type 2 diabetes mellitus with other diabetic kidney complication: Secondary | ICD-10-CM

## 2019-02-01 DIAGNOSIS — N183 Chronic kidney disease, stage 3 unspecified: Secondary | ICD-10-CM

## 2019-02-01 DIAGNOSIS — R809 Proteinuria, unspecified: Secondary | ICD-10-CM

## 2019-02-01 DIAGNOSIS — Z79899 Other long term (current) drug therapy: Secondary | ICD-10-CM | POA: Diagnosis not present

## 2019-02-01 DIAGNOSIS — E1122 Type 2 diabetes mellitus with diabetic chronic kidney disease: Secondary | ICD-10-CM

## 2019-02-02 LAB — COMPREHENSIVE METABOLIC PANEL
ALBUMIN: 4.3 g/dL (ref 3.6–4.6)
ALK PHOS: 60 IU/L (ref 39–117)
ALT: 11 IU/L (ref 0–44)
AST: 21 IU/L (ref 0–40)
Albumin/Globulin Ratio: 2.3 — ABNORMAL HIGH (ref 1.2–2.2)
BUN / CREAT RATIO: 15 (ref 10–24)
BUN: 26 mg/dL (ref 8–27)
Bilirubin Total: 0.5 mg/dL (ref 0.0–1.2)
CO2: 23 mmol/L (ref 20–29)
Calcium: 9.6 mg/dL (ref 8.6–10.2)
Chloride: 101 mmol/L (ref 96–106)
Creatinine, Ser: 1.78 mg/dL — ABNORMAL HIGH (ref 0.76–1.27)
GFR calc Af Amer: 40 mL/min/{1.73_m2} — ABNORMAL LOW (ref >59)
GFR calc non Af Amer: 34 mL/min/{1.73_m2} — ABNORMAL LOW (ref >59)
Globulin, Total: 1.9 g/dL (ref 1.5–4.5)
Glucose: 137 mg/dL — ABNORMAL HIGH (ref 65–99)
Potassium: 5.4 mmol/L — ABNORMAL HIGH (ref 3.5–5.2)
Sodium: 140 mmol/L (ref 134–144)
Total Protein: 6.2 g/dL (ref 6.0–8.5)

## 2019-02-02 LAB — HEMOGLOBIN A1C
Est. average glucose Bld gHb Est-mCnc: 180 mg/dL
Hgb A1c MFr Bld: 7.9 % — ABNORMAL HIGH (ref 4.8–5.6)

## 2019-02-02 LAB — CBC WITH DIFFERENTIAL/PLATELET
Basophils Absolute: 0.1 10*3/uL (ref 0.0–0.2)
Basos: 1 %
EOS (ABSOLUTE): 0.1 10*3/uL (ref 0.0–0.4)
Eos: 1 %
Hematocrit: 37.7 % (ref 37.5–51.0)
Hemoglobin: 13 g/dL (ref 13.0–17.7)
Immature Grans (Abs): 0 10*3/uL (ref 0.0–0.1)
Immature Granulocytes: 0 %
Lymphocytes Absolute: 2 10*3/uL (ref 0.7–3.1)
Lymphs: 28 %
MCH: 33.2 pg — ABNORMAL HIGH (ref 26.6–33.0)
MCHC: 34.5 g/dL (ref 31.5–35.7)
MCV: 96 fL (ref 79–97)
Monocytes Absolute: 0.5 10*3/uL (ref 0.1–0.9)
Monocytes: 7 %
NEUTROS PCT: 63 %
Neutrophils Absolute: 4.6 10*3/uL (ref 1.4–7.0)
PLATELETS: 206 10*3/uL (ref 150–450)
RBC: 3.91 x10E6/uL — ABNORMAL LOW (ref 4.14–5.80)
RDW: 11.6 % (ref 11.6–15.4)
WBC: 7.2 10*3/uL (ref 3.4–10.8)

## 2019-02-02 LAB — VITAMIN B12: Vitamin B-12: 1154 pg/mL (ref 232–1245)

## 2019-02-02 LAB — MICROALBUMIN / CREATININE URINE RATIO
Creatinine, Urine: 116.6 mg/dL
Microalb/Creat Ratio: <3 mg/g creat (ref 0–29)
Microalbumin, Urine: <3 ug/mL

## 2019-02-02 LAB — MAGNESIUM: Magnesium: 1.6 mg/dL (ref 1.6–2.3)

## 2019-02-02 NOTE — Pre-Procedure Instructions (Addendum)
George Barnett  02/02/2019      [place], [place] [address] Phone: [phone] Fax: [phone]  [facility] [place], [place] [address] Phone: [phone] Fax: [phone]  [facility] [place], [place] [address] [place] 05/29/2022 Phone: [phone] Fax: [phone]    Your procedure is scheduled on Thurs., Apr 01, 2019 from 7:30AM-9:00AM  Report to [facility] Entrance "A" at 5:30AM  Call this number if you have problems the morning of surgery:  [phone]   Remember:  Do not eat after midnight on May 13th  You may drink clear liquids until 3 hours (5:30AM) prior to surgery.  Clear liquids allowed are:  Water, Juice (non-citric and without pulp), Carbonated beverages, Clear Tea, Black Coffee only, Plain Jell-O only, Gatorade and Plain Popsicles only    Take these medicines the morning of surgery with:  mupirocin ointment (BACTROBAN)  Follow your surgeon's instructions on when to stop Aspirin.  If no instructions were given by your surgeon then you will need to call the office to get those instructions.    7 days before surgery stop taking all Other Aspirin Products, Vitamins, Fish oils, and Herbal medications. Also stop all NSAIDS i.e. Advil, Ibuprofen, Motrin, Aleve, Anaprox, Naproxen, BC, Goody Powders, and all Supplements.   . Do not take MetFORMIN (GLUCOPHAGE) the morning of surgery.   How to Manage Your Diabetes Before and After Surgery  Why is it important to control my blood sugar before and after surgery? . Improving blood sugar levels before and after surgery helps healing and can limit problems. . A way of improving blood sugar control is eating a healthy diet by: o  Eating less sugar and carbohydrates o  Increasing activity/exercise o   Talking with your doctor about reaching your blood sugar goals . High blood sugars (greater than 180 mg/dL) can raise your risk of infections and slow your recovery, so you will need to focus on controlling your diabetes during the weeks before surgery. . Make sure that the doctor who takes care of your diabetes knows about your planned surgery including the date and location.  How do I manage my blood sugar before surgery? . Check your blood sugar at least 4 times a day, starting 2 days before surgery, to make sure that the level is not too high or low. o Check your blood sugar the morning of your surgery when you wake up and every 2 hours until you get to the Short Stay unit. . If your blood sugar is less than 70 mg/dL, you will need to treat for low blood sugar: o Do not take insulin. o Treat a low blood sugar (less than 70 mg/dL) with  cup of clear juice (cranberry or apple), 4 glucose tablets, OR glucose gel. Recheck blood sugar in 15 minutes after treatment (to make sure it is greater than 70 mg/dL). If your blood sugar is not greater than 70 mg/dL on recheck, call [phone] o  for further instructions.                                . If your CBG is greater than 220 mg/dL, inform the staff upon arrival to Short  Stay.  . If you are admitted to the hospital after surgery: o Your blood sugar will be checked by the staff and you will probably be given insulin after surgery (instead of oral diabetes medicines) to make sure you have good blood sugar levels. o The goal for blood sugar control after surgery is 80-180 mg/dL.  Reviewed and Endorsed by [facility] Patient Education Committee, August 2015     Do not wear jewelry.  Do not wear lotions, powders, colognes, or deodorant.  Do not shave 48 hours prior to surgery.  Men may shave face.  Do not bring valuables to the hospital.  [facility] is not responsible for any belongings or valuables.  Contacts, dentures or bridgework may not  be worn into surgery.  Leave your suitcase in the car.  After surgery it may be brought to your room.  For patients admitted to the hospital, discharge time will be determined by your treatment team.  Patients discharged the day of surgery will not be allowed to drive home.   Special instructions:   [place]- Preparing For Surgery  Before surgery, you can play an important role. Because skin is not sterile, your skin needs to be as free of germs as possible. You can reduce the number of germs on your skin by washing with CHG (chlorahexidine gluconate) Soap before surgery.  CHG is an antiseptic cleaner which kills germs and bonds with the skin to continue killing germs even after washing.    Oral Hygiene is also important to reduce your risk of infection.  Remember - BRUSH YOUR TEETH THE MORNING OF SURGERY WITH YOUR REGULAR TOOTHPASTE  Please do not use if you have an allergy to CHG or antibacterial soaps. If your skin becomes reddened/irritated stop using the CHG.  Do not shave (including legs and underarms) for at least 48 hours prior to first CHG shower. It is OK to shave your face.  Please follow these instructions carefully.   1. Shower the NIGHT BEFORE SURGERY and the MORNING OF SURGERY with CHG.   2. If you chose to wash your hair, wash your hair first as usual with your normal shampoo.  3. After you shampoo, rinse your hair and body thoroughly to remove the shampoo.  4. Use CHG as you would any other liquid soap. You can apply CHG directly to the skin and wash gently with a scrungie or a clean washcloth.   5. Apply the CHG Soap to your body ONLY FROM THE NECK DOWN.  Do not use on open wounds or open sores. Avoid contact with your eyes, ears, mouth and genitals (private parts). Wash Face and genitals (private parts)  with your normal soap.  6. Wash thoroughly, paying special attention to the area where your surgery will be performed.  7. Thoroughly rinse your body with warm  water from the neck down.  8. DO NOT shower/wash with your normal soap after using and rinsing off the CHG Soap.  9. Pat yourself dry with a CLEAN TOWEL.  10. Wear CLEAN PAJAMAS to bed the night before surgery, wear comfortable clothes the morning of surgery  11. Place CLEAN SHEETS on your bed the night of your first shower and DO NOT SLEEP WITH PETS.  Day of Surgery:  Do not apply any deodorants/lotions.  Please wear clean clothes to the hospital/surgery center.   Remember to brush your teeth WITH YOUR REGULAR TOOTHPASTE.  Please read over the following fact sheets that you were given. Pain  Booklet, Coughing and Deep Breathing and Surgical Site Infection Prevention

## 2019-02-03 ENCOUNTER — Inpatient Hospital Stay (HOSPITAL_COMMUNITY)
Admission: RE | Admit: 2019-02-03 | Discharge: 2019-02-03 | Disposition: A | Discharge status: Home / Self Care | Payer: Medicare Other | Source: Ambulatory Visit

## 2019-02-04 DIAGNOSIS — H16101 Unspecified superficial keratitis, right eye: Secondary | ICD-10-CM | POA: Diagnosis not present

## 2019-02-04 DIAGNOSIS — H04121 Dry eye syndrome of right lacrimal gland: Secondary | ICD-10-CM | POA: Diagnosis not present

## 2019-02-04 DIAGNOSIS — H02051 Trichiasis without entropian right upper eyelid: Secondary | ICD-10-CM | POA: Diagnosis not present

## 2019-02-04 DIAGNOSIS — H01001 Unspecified blepharitis right upper eyelid: Secondary | ICD-10-CM | POA: Diagnosis not present

## 2019-02-12 DIAGNOSIS — T1511XA Foreign body in conjunctival sac, right eye, initial encounter: Secondary | ICD-10-CM | POA: Diagnosis not present

## 2019-03-03 ENCOUNTER — Other Ambulatory Visit: Payer: Self-pay

## 2019-03-03 ENCOUNTER — Ambulatory Visit (INDEPENDENT_AMBULATORY_CARE_PROVIDER_SITE_OTHER): Payer: Medicare Other | Admitting: Family Medicine

## 2019-03-03 ENCOUNTER — Encounter: Payer: Self-pay | Admitting: Family Medicine

## 2019-03-03 VITALS — BP 125/79 | HR 74 | Ht 73.0 in | Wt 164.0 lb

## 2019-03-03 DIAGNOSIS — E1159 Type 2 diabetes mellitus with other circulatory complications: Secondary | ICD-10-CM

## 2019-03-03 DIAGNOSIS — E1129 Type 2 diabetes mellitus with other diabetic kidney complication: Secondary | ICD-10-CM

## 2019-03-03 DIAGNOSIS — E1169 Type 2 diabetes mellitus with other specified complication: Secondary | ICD-10-CM | POA: Diagnosis not present

## 2019-03-03 DIAGNOSIS — N184 Chronic kidney disease, stage 4 (severe): Secondary | ICD-10-CM | POA: Diagnosis not present

## 2019-03-03 DIAGNOSIS — I10 Essential (primary) hypertension: Secondary | ICD-10-CM | POA: Diagnosis not present

## 2019-03-03 DIAGNOSIS — R809 Proteinuria, unspecified: Secondary | ICD-10-CM

## 2019-03-03 MED ORDER — METFORMIN HCL 500 MG PO TABS: 500.0000 mg | ORAL_TABLET | Freq: Two times a day (BID) | ORAL | 1 refills | Status: DC

## 2019-03-03 MED ORDER — GLIMEPIRIDE 1 MG PO TABS: 1.0000 mg | ORAL_TABLET | Freq: Every day | ORAL | 1 refills | Status: DC

## 2019-03-03 NOTE — Progress Notes (Signed)
Virtual Visit via Telephone Note for George Barnett, D.O- Primary Care Physician at [facility]   I connected with current patient today by telephone and verified that I am speaking with the correct person using two identifiers.   Because of federal recommendations of social distancing due to the current novel COVID-19 outbreak, an audio/video telehealth visit is felt to be most appropriate for this patient at this time.  My staff members also discussed with the patient that there may be a patient charge related to this service.   The patient expressed understanding, and agreed to proceed.    History of Present Illness:   2/28 was last OV.   Was told: Return for 3-4wks, lab only, then 2) OV with me 5 wks- bring in BP and BS log.   bp at that time was 164/82.       Diabetes mellitus: We increased his metformin last OV, this was because prior we had taken him off the Amaryl due to fears of low sugars and patient's age (as well as patient having history of dizziness due to orthostatic hypotension for many yrs now etc  --->   Now on 1,000 BID.   Tol well.  His blood sugars are much better controlled, but now he had a bump in his serum creatinine    - patient's A1c has been very well controlled over the past couple of years with the lowest A1c being 6.1 just 3 months ago and his highest being 7.0 since he has been seeing me.   During this time, patient was taking glimepiride 1 mg orally with breakfast and 500 metformin in the morning and 500 in the evening.  Since on November 27, 2018 his A1c was 6.1, we decided to DC his glimepiride at that time-January 10.     Then patient was seen on 01/15/2019 for acute office visit due to concerns that his blood sugars were going up.  At that time we increased metformin in the evening to 1000 every afternoon and still 500 every morning.   After only 2 weeks on increased dose of metformin on 02/01/2019 his A1c jumped to 7.9.      Chronic kidney disease:     However, with the metformin change, his serum creatinine did go up from the 1.3 is up to 1.78 and GFR in the 40s to now 34.    Also urine microalbumin to creatinine ratio was 30-300 when checked 8\28\18 this is what prompted me to just start his losartan at low dose.  Pt reports compliance with medications and/ or treatment plan Denies medication or current treatment plan S-E  Denies polyuria/polydipsia. Denies hypoglycemia symptoms  Last diabetic eye exam was  Lab Results  Component Value Date   HMDIABEYEEXA No Retinopathy 04/16/2018    Lab Results  Component Value Date   HGBA1C 7.9 (H) 02/01/2019   HGBA1C 6.1 (A) 11/27/2018   HGBA1C 7.0 (A) 07/27/2018    Lab Results  Component Value Date   MICROALBUR 10 07/15/2017   LDLCALC 46 03/17/2018   CREATININE 1.78 (H) 02/01/2019    2) Hyperlipidemia  Pt reports compliance with meds and/or treatment plan   Denies Medication S-E  (RUQ pain; Muscle aches )   Last lipid panel as follows:  Lab Results  Component Value Date   CHOL 105 03/17/2018   HDL 49 03/17/2018   LDLCALC 46 03/17/2018   TRIG 52 03/17/2018   CHOLHDL 2.1 03/17/2018    Lab  Results  Component Value Date   ALT 11 02/01/2019    3) Hypertension  Pt reports compliance with medications and/ or treatment plan Denies medication S-E. Home Blood pressure range-much improved from last office visit.  Running in the 120s- too upper 381O systolically over 17P-10C Low salt diet-    relatively speaking Exercise-    very little Patient denies new onset of sx- no chest pain, dizziness, HA, DIB/ shortness of breath or swelling.  Lab Results  Component Value Date   CREATININE 1.78 (H) 02/01/2019   Last 3 blood pressure readings in our office are as follows: BP Readings from Last 3 Encounters:  03/23/19 135/75  03/03/19 125/79  01/15/19 (!) 164/82   Wt Readings from Last 3 Encounters:  03/23/19 160 lb (72.6 kg)  03/03/19 164 lb (74.4 kg)  01/15/19 159 lb 12.8  oz (72.5 kg)    BMI Readings from Last 3 Encounters:  03/23/19 21.11 kg/m  03/03/19 21.64 kg/m  01/15/19 21.08 kg/m       Patient Care Team    Relationship Specialty Notifications Start End  Mellody Dance, DO PCP - General Family Medicine  09/01/17   Jerrell Belfast, MD Consulting Physician Otolaryngology  03/16/18   Jarome Matin, MD Consulting Physician Dermatology  03/16/18   Franchot Gallo, MD Consulting Physician Urology  07/27/18   Jamse Arn, MD Consulting Physician Physical Medicine and Rehabilitation  07/27/18   Deneise Lever, MD Consulting Physician Pulmonary Disease  07/27/18   Park Liter, MD Consulting Physician Cardiology  11/27/18        Wt Readings from Last 3 Encounters:  03/23/19 160 lb (72.6 kg)  03/03/19 164 lb (74.4 kg)  01/15/19 159 lb 12.8 oz (72.5 kg)   Pulse Readings from Last 3 Encounters:  03/23/19 68  03/03/19 74  01/15/19 70    BMI Readings from Last 3 Encounters:  03/23/19 21.11 kg/m  03/03/19 21.64 kg/m  01/15/19 21.08 kg/m    -Vitals obtained; Medications, allergies reconciled;  personal medical, social, Sx etc. etc. histories were updated by Lanier Prude the medical assistant today and are reflected in below chart   Patient Care Team    Relationship Specialty Notifications Start End  Mellody Dance, DO PCP - General Family Medicine  09/01/17   Jerrell Belfast, MD Consulting Physician Otolaryngology  03/16/18   Jarome Matin, MD Consulting Physician Dermatology  03/16/18   Franchot Gallo, MD Consulting Physician Urology  07/27/18   Jamse Arn, MD Consulting Physician Physical Medicine and Rehabilitation  07/27/18   Deneise Lever, MD Consulting Physician Pulmonary Disease  07/27/18   Park Liter, MD Consulting Physician Cardiology  11/27/18      Patient Active Problem List   Diagnosis Date Noted  . Hypertension associated with diabetes ([place]) 08/05/2017    Priority: High  . Mixed diabetic  hyperlipidemia associated with type 2 diabetes mellitus ([place]) 08/05/2017    Priority: High  . Microalbuminuria due to type 2 diabetes mellitus ([place]) 08/05/2017    Priority: High  . Type 2 diabetes mellitus with stage 3 chronic kidney disease, without long-term current use of insulin ([place]) 05/05/2017    Priority: High  . High risk medication use 01/15/2019  . Venous insufficiency of both lower extremities 11/27/2018  . Tremor of both hands-  both wiht rest and activity; acutely worse 11/27/2018  . Orthostatic hypotension 11/05/2018  . Cancer ([place]) 07/27/2018  . Folliculitis- scalp; txed by Derm- Dr Jarome Matin 07/27/2018  .  Bilateral lower extremity edema 11/17/2017  . RLS (restless legs syndrome) 06/17/2017  . GERD (gastroesophageal reflux disease) 05/05/2017  . Glossodynia 05/05/2017  . Costochondritis 05/05/2017  . Stage 3 chronic kidney disease ([place]) 04/22/2017  . Abdominal pain 03/25/2017  . CAD (coronary artery disease) 03/25/2017  . Diabetes mellitus without complication ([place]) 68[birth year]34[birth year]1962  . Snoring 03/25/2017  . Urinary retention due to benign prostatic hyperplasia 01/25/2015  . Primary osteoarthritis of right shoulder 01/24/2015  . Edema, peripheral 11/22/2014     Current Meds  Medication Sig  . aspirin 81 MG tablet Take 81 mg by mouth daily.  Marland Kitchen atorvastatin (LIPITOR) 10 MG tablet TAKE AS INSTRUCTED BY YOUR PRESCRIBER (Patient taking differently: Take 10 mg by mouth daily at 6 PM. )  . Cobalamine Combinations (I-29 + FOLIC ACID PO) Take 1 capsule by mouth daily.   Marland Kitchen losartan (COZAAR) 25 MG tablet TAKE 1 TABLET DAILY (Patient taking differently: Take 25 mg by mouth daily. )  . metFORMIN (GLUCOPHAGE) 500 MG tablet Take 1 tablet (500 mg total) by mouth 2 (two) times daily with a meal.  . polyethylene glycol (MIRALAX / GLYCOLAX) packet Take 17 g by mouth daily.  . [DISCONTINUED] metFORMIN (GLUCOPHAGE) 500 MG tablet 2 po q am and 2 po q pm ( titrate down per bld sugar) (Patient  taking differently: Take 1,000 mg by mouth 2 (two) times daily with a meal. )     Allergies:  Allergies  Allergen Reactions  . Exenatide     nausea  . Meperidine And Related     Red streak, that happened once while having an IV  . Nadolol     Numbness in fingers  . Sitagliptin Phosphate [Sitagliptin]     Weight loss     ROS:  See above HPI for pertinent positives and negatives   Objective:   Blood pressure 125/79, pulse 74, height 6\' 1"  (1.854 m), weight 164 lb (74.4 kg). (if some vitals are omitted, this means that patient was UNABLE to obtain them even though asked to get them prior to OV today) General: sounds in no acute distress.  Skin: Pt confirms warm and dry extremities and pink fingertips Respiratory: speaking in full sentences, no conversational dyspnea Psych: A and O * 3, appears insight good, mood- full      Impression and Recommendations:      ICD-10-CM   1. Type 2 diabetes mellitus with other specified complication, without long-term current use of insulin (HCC) E11.69 glimepiride (AMARYL) 1 MG tablet    metFORMIN (GLUCOPHAGE) 500 MG tablet  2. Stage 4 chronic kidney disease ([employer]) N18.4   3. Hypertension associated with diabetes ([place]) E11.59    I10   4. Microalbuminuria due to type 2 diabetes mellitus ([place]) E11.29    R80.9    Back in 2018 : Results were 30-300    -We will lower his metformin dose back down to 500 twice daily-this is level where his serum creatinine remained at 1.3 for many years.  We also will add back the Amaryl to his regiment due to elevated blood sugar.  Again warned against risks of hypoglycemic events.  Continue blood pressure monitoring at home.  Appears to be at goal.  Recommend low-salt diet and trying to be more active of walking 5 to 10 minutes twice daily  For chronic kidney disease- we will recheck A1c and BMP with GFR in 2 months around 05/03/2019  As part of my medical decision making, I reviewed the following  data within  the [place] obtained from pt/family, CMA notes reviewed and incorporated, Labs reviewed, Radiograph/ tests reviewed if applicable and OV notes from prior OV's with me, as well as other specialists he has seen since seeing me last, were all reviewed and used in my medical decision making process today. Additionally, discussion had with patient regarding txmnt plan, their biases about that plan etc were used in my medical decision making today.  I discussed the assessment and treatment plan with the patient. The patient was provided an opportunity to ask questions and all were answered.  The patient agreed with the plan and demonstrated an understanding of the instructions.   No barriers to understanding were identified.  Red flag symptoms and signs discussed in detail.  Patient expressed understanding regarding what to do in case of emergency\urgent symptoms   The patient was advised to call back or seek an in-person evaluation if the symptoms worsen or if the condition fails to improve as anticipated.   Return for dec metformin to 500bid d/t inc Crt, added amaryl back due to 7.9 a1c on 02/01/2019 ((we will recheck A1c and BMP with GFR in 2 months around 05/03/2019 at follow-up office visit))   Meds ordered this encounter  Medications  . glimepiride (AMARYL) 1 MG tablet    Sig: Take 1 tablet (1 mg total) by mouth daily with breakfast.    Dispense:  90 tablet    Refill:  1  . metFORMIN (GLUCOPHAGE) 500 MG tablet    Sig: Take 1 tablet (500 mg total) by mouth 2 (two) times daily with a meal.    Dispense:  180 tablet    Refill:  1    Medications Discontinued During This Encounter  Medication Reason  . mupirocin ointment (BACTROBAN) 2 % Completed Course  . metFORMIN (GLUCOPHAGE) 500 MG tablet     **Gross side effects, risk and benefits, and alternatives of medications and treatment plan in general discussed with patient.  Patient is aware that all medications have  potential side effects and we are unable to predict every side effect or drug-drug interaction that may occur.   Patient was strongly encouraged to call with any questions or concerns they may have concerns.     I provided 23+ minutes of non-face-to-face time during this encounter.   Mellody Dance, DO

## 2019-03-08 DIAGNOSIS — N401 Enlarged prostate with lower urinary tract symptoms: Secondary | ICD-10-CM | POA: Diagnosis not present

## 2019-03-08 DIAGNOSIS — R351 Nocturia: Secondary | ICD-10-CM | POA: Diagnosis not present

## 2019-03-08 DIAGNOSIS — N281 Cyst of kidney, acquired: Secondary | ICD-10-CM | POA: Diagnosis not present

## 2019-03-17 ENCOUNTER — Telehealth: Payer: Self-pay | Admitting: Cardiology

## 2019-03-17 NOTE — Telephone Encounter (Signed)
Virtual Visit Pre-Appointment Phone Call  "(Name), I am calling you today to discuss your upcoming appointment. We are currently trying to limit exposure to the virus that causes COVID-19 by seeing patients at home rather than in the office."  1. "What is the BEST phone number to call the day of the visit?" - include this in appointment notes  2. Do you have or have access to (through a family member/friend) a smartphone with video capability that we can use for your visit?" a. If yes - list this number in appt notes as cell (if different from BEST phone #) and list the appointment type as a VIDEO visit in appointment notes b. If no - list the appointment type as a PHONE visit in appointment notes  3. Confirm consent - "In the setting of the current Covid19 crisis, you are scheduled for a (phone or video) visit with your provider on (date) at (time).  Just as we do with many in-office visits, in order for you to participate in this visit, we must obtain consent.  If you'd like, I can send this to your mychart (if signed up) or email for you to review.  Otherwise, I can obtain your verbal consent now.  All virtual visits are billed to your insurance company just like a normal visit would be.  By agreeing to a virtual visit, we'd like you to understand that the technology does not allow for your provider to perform an examination, and thus may limit your provider's ability to fully assess your condition. If your provider identifies any concerns that need to be evaluated in person, we will make arrangements to do so.  Finally, though the technology is pretty good, we cannot assure that it will always work on either your or our end, and in the setting of a video visit, we may have to convert it to a phone-only visit.  In either situation, we cannot ensure that we have a secure connection.  Are you willing to proceed?" STAFF: Did the patient verbally acknowledge consent to telehealth visit? Document  YES/NO here: YES  4. Advise patient to be prepared - "Two hours prior to your appointment, go ahead and check your blood pressure, pulse, oxygen saturation, and your weight (if you have the equipment to check those) and write them all down. When your visit starts, your provider will ask you for this information. If you have an Apple Watch or Kardia device, please plan to have heart rate information ready on the day of your appointment. Please have a pen and paper handy nearby the day of the visit as well."  5. Give patient instructions for MyChart download to smartphone OR Doximity/Doxy.me as below if video visit (depending on what platform provider is using)  6. Inform patient they will receive a phone call 15 minutes prior to their appointment time (may be from unknown caller ID) so they should be prepared to answer    TELEPHONE CALL NOTE  George Barnett has been deemed a candidate for a follow-up tele-health visit to limit community exposure during the Covid-19 pandemic. I spoke with the patient via phone to ensure availability of phone/video source, confirm preferred email & phone number, and discuss instructions and expectations.  I reminded George Barnett to be prepared with any vital sign and/or heart rhythm information that could potentially be obtained via home monitoring, at the time of his visit. I reminded George Barnett to expect a phone call prior to his visit.  George Barnett 03/17/2019 4:26 PM   INSTRUCTIONS FOR DOWNLOADING THE MYCHART APP TO SMARTPHONE  - The patient must first make sure to have activated MyChart and know their login information - If Apple, go to [employer] and type in MyChart in the search bar and download the app. If Android, ask patient to go to [employer] and type in [place] in the search bar and download the app. The app is free but as with any other app downloads, their phone may require them to verify saved payment information or  Apple/Android password.  - The patient will need to then log into the app with their MyChart username and password, and select [facility] as their healthcare provider to link the account. When it is time for your visit, go to the MyChart app, find appointments, and click Begin Video Visit. Be sure to Select Allow for your device to access the Microphone and Camera for your visit. You will then be connected, and your provider will be with you shortly.  **If they have any issues connecting, or need assistance please contact MyChart service desk [phone])**  **If using a computer, in order to ensure the best quality for their visit they will need to use either of the following Internet Browsers: [employer], or Google Chrome**  IF USING DOXIMITY or DOXY.ME - The patient will receive a link just prior to their visit by text.     FULL LENGTH CONSENT FOR TELE-HEALTH VISIT   I hereby voluntarily request, consent and authorize [facility] and its employed or contracted physicians, physician assistants, nurse practitioners or other licensed health care professionals (the Practitioner), to provide me with telemedicine health care services (the Services") as deemed necessary by the treating Practitioner. I acknowledge and consent to receive the Services by the Practitioner via telemedicine. I understand that the telemedicine visit will involve communicating with the Practitioner through live audiovisual communication technology and the disclosure of certain medical information by electronic transmission. I acknowledge that I have been given the opportunity to request an in-person assessment or other available alternative prior to the telemedicine visit and am voluntarily participating in the telemedicine visit.  I understand that I have the right to withhold or withdraw my consent to the use of telemedicine in the course of my care at any time, without affecting my right to future care  or treatment, and that the Practitioner or I may terminate the telemedicine visit at any time. I understand that I have the right to inspect all information obtained and/or recorded in the course of the telemedicine visit and may receive copies of available information for a reasonable fee.  I understand that some of the potential risks of receiving the Services via telemedicine include:   Delay or interruption in medical evaluation due to technological equipment failure or disruption;  Information transmitted may not be sufficient (e.g. poor resolution of images) to allow for appropriate medical decision making by the Practitioner; and/or   In rare instances, security protocols could fail, causing a breach of personal health information.  Furthermore, I acknowledge that it is my responsibility to provide information about my medical history, conditions and care that is complete and accurate to the best of my ability. I acknowledge that Practitioner's advice, recommendations, and/or decision may be based on factors not within their control, such as incomplete or inaccurate data provided by me or distortions of diagnostic images or specimens that may result from electronic transmissions. I understand that the  practice of medicine is not an Chief Strategy Officer and that Practitioner makes no warranties or guarantees regarding treatment outcomes. I acknowledge that I will receive a copy of this consent concurrently upon execution via email to the email address I last provided but may also request a printed copy by calling the office of [facility].    I understand that my insurance will be billed for this visit.   I have read or had this consent read to me.  I understand the contents of this consent, which adequately explains the benefits and risks of the Services being provided via telemedicine.   I have been provided ample opportunity to ask questions regarding this consent and the Services and have had  my questions answered to my satisfaction.  I give my informed consent for the services to be provided through the use of telemedicine in my medical care  By participating in this telemedicine visit I agree to the above.

## 2019-03-23 ENCOUNTER — Other Ambulatory Visit: Payer: Self-pay

## 2019-03-23 ENCOUNTER — Encounter: Payer: Self-pay | Admitting: Cardiology

## 2019-03-23 ENCOUNTER — Telehealth (INDEPENDENT_AMBULATORY_CARE_PROVIDER_SITE_OTHER): Payer: Medicare Other | Admitting: Cardiology

## 2019-03-23 VITALS — BP 135/75 | HR 68 | Wt 160.0 lb

## 2019-03-23 DIAGNOSIS — I951 Orthostatic hypotension: Secondary | ICD-10-CM | POA: Diagnosis not present

## 2019-03-23 DIAGNOSIS — E782 Mixed hyperlipidemia: Secondary | ICD-10-CM

## 2019-03-23 DIAGNOSIS — L570 Actinic keratosis: Secondary | ICD-10-CM | POA: Diagnosis not present

## 2019-03-23 DIAGNOSIS — Z85828 Personal history of other malignant neoplasm of skin: Secondary | ICD-10-CM | POA: Diagnosis not present

## 2019-03-23 DIAGNOSIS — E1169 Type 2 diabetes mellitus with other specified complication: Secondary | ICD-10-CM

## 2019-03-23 DIAGNOSIS — L0889 Other specified local infections of the skin and subcutaneous tissue: Secondary | ICD-10-CM | POA: Diagnosis not present

## 2019-03-23 DIAGNOSIS — E1159 Type 2 diabetes mellitus with other circulatory complications: Secondary | ICD-10-CM

## 2019-03-23 DIAGNOSIS — I2510 Atherosclerotic heart disease of native coronary artery without angina pectoris: Secondary | ICD-10-CM

## 2019-03-23 DIAGNOSIS — I10 Essential (primary) hypertension: Secondary | ICD-10-CM
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
## 2019-03-23 NOTE — Patient Instructions (Signed)
Medication Instructions:  Your physician recommends that you continue on your current medications as directed. Please refer to the Current Medication list given to you today.  If you need a refill on your cardiac medications before your next appointment, please call your pharmacy.   Lab work: None.   If you have labs (blood work) drawn today and your tests are completely normal, you will receive your results only by: [facility] Kitchen MyChart Message (if you have MyChart) OR . A paper copy in the mail If you have any lab test that is abnormal or we need to change your treatment, we will call you to review the results.  Testing/Procedures: Your physician has requested that you have an echocardiogram. Echocardiography is a painless test that uses sound waves to create images of your heart. It provides your doctor with information about the size and shape of your heart and how well your heart's chambers and valves are working. This procedure takes approximately one hour. There are no restrictions for this procedure.    Follow-Up: At [facility], you and your health needs are our priority.  As part of our continuing mission to provide you with exceptional heart care, we have created designated Provider Care Teams.  These Care Teams include your primary Cardiologist (physician) and Advanced Practice Providers (APPs -  Physician Assistants and Nurse Practitioners) who all work together to provide you with the care you need, when you need it. You will need a follow up appointment in 4 months.  Please call our office 2 months in advance to schedule this appointment.  You may see No primary care provider on file. or another member of our [employer] Provider Team in [place]: Shirlee More, MD . Jyl Heinz, MD  Any Other Special Instructions Will Be Listed Below (If Applicable).   Echocardiogram An echocardiogram is a procedure that uses painless sound waves (ultrasound) to produce an image of the heart.  Images from an echocardiogram can provide important information about:  Signs of coronary artery disease (CAD).  Aneurysm detection. An aneurysm is a weak or damaged part of an artery wall that bulges out from the normal force of blood pumping through the body.  Heart size and shape. Changes in the size or shape of the heart can be associated with certain conditions, including heart failure, aneurysm, and CAD.  Heart muscle function.  Heart valve function.  Signs of a past heart attack.  Fluid buildup around the heart.  Thickening of the heart muscle.  A tumor or infectious growth around the heart valves. Tell a health care provider about:  Any allergies you have.  All medicines you are taking, including vitamins, herbs, eye drops, creams, and over-the-counter medicines.  Any blood disorders you have.  Any surgeries you have had.  Any medical conditions you have.  Whether you are pregnant or may be pregnant. What are the risks? Generally, this is a safe procedure. However, problems may occur, including:  Allergic reaction to dye (contrast) that may be used during the procedure. What happens before the procedure? No specific preparation is needed. You may eat and drink normally. What happens during the procedure?   An IV tube may be inserted into one of your veins.  You may receive contrast through this tube. A contrast is an injection that improves the quality of the pictures from your heart.  A gel will be applied to your chest.  A wand-like tool (transducer) will be moved over your chest. The gel will help  to transmit the sound waves from the transducer.  The sound waves will harmlessly bounce off of your heart to allow the heart images to be captured in real-time motion. The images will be recorded on a computer. The procedure may vary among health care providers and hospitals. What happens after the procedure?  You may return to your normal, everyday life,  including diet, activities, and medicines, unless your health care provider tells you not to do that. Summary  An echocardiogram is a procedure that uses painless sound waves (ultrasound) to produce an image of the heart.  Images from an echocardiogram can provide important information about the size and shape of your heart, heart muscle function, heart valve function, and fluid buildup around your heart.  You do not need to do anything to prepare before this procedure. You may eat and drink normally.  After the echocardiogram is completed, you may return to your normal, everyday life, unless your health care provider tells you not to do that. This information is not intended to replace advice given to you by your health care provider. Make sure you discuss any questions you have with your health care provider. Document Released: 11/01/2000 Document Revised: 12/07/2016 Document Reviewed: 12/07/2016 Elsevier Interactive Patient Education  2019 [employer].

## 2019-03-25 ENCOUNTER — Ambulatory Visit: Payer: Self-pay | Admitting: Surgery

## 2019-04-01 ENCOUNTER — Ambulatory Visit (HOSPITAL_COMMUNITY): Admission: RE | Admit: 2019-04-01 | Payer: Medicare Other | Source: Home / Self Care | Admitting: Surgery

## 2019-04-01 ENCOUNTER — Encounter (HOSPITAL_COMMUNITY): Admission: RE | Payer: Self-pay | Source: Home / Self Care

## 2019-04-01 SURGERY — REPAIR, HERNIA, INGUINAL, ADULT
Anesthesia: General | Laterality: Right

## 2019-04-09 ENCOUNTER — Other Ambulatory Visit: Payer: Self-pay | Admitting: Family Medicine

## 2019-04-09 DIAGNOSIS — E1169 Type 2 diabetes mellitus with other specified complication: Secondary | ICD-10-CM

## 2019-04-12 ENCOUNTER — Other Ambulatory Visit: Payer: Self-pay | Admitting: Family Medicine

## 2019-04-12 DIAGNOSIS — E782 Mixed hyperlipidemia: Secondary | ICD-10-CM

## 2019-04-12 DIAGNOSIS — E1169 Type 2 diabetes mellitus with other specified complication: Secondary | ICD-10-CM

## 2019-04-13 MED ORDER — ATORVASTATIN CALCIUM 10 MG PO TABS: 10.0000 mg | ORAL_TABLET | Freq: Every day | ORAL | 0 refills | Status: DC

## 2019-04-21 DIAGNOSIS — E119 Type 2 diabetes mellitus without complications: Secondary | ICD-10-CM | POA: Diagnosis not present

## 2019-04-21 DIAGNOSIS — H52203 Unspecified astigmatism, bilateral: Secondary | ICD-10-CM | POA: Diagnosis not present

## 2019-04-21 DIAGNOSIS — H0100A Unspecified blepharitis right eye, upper and lower eyelids: Secondary | ICD-10-CM | POA: Diagnosis not present

## 2019-04-21 DIAGNOSIS — H04123 Dry eye syndrome of bilateral lacrimal glands: Secondary | ICD-10-CM | POA: Diagnosis not present

## 2019-04-21 LAB — HM DIABETES EYE EXAM

## 2019-04-27 DIAGNOSIS — Z1283 Encounter for screening for malignant neoplasm of skin: Secondary | ICD-10-CM | POA: Diagnosis not present

## 2019-04-27 DIAGNOSIS — L989 Disorder of the skin and subcutaneous tissue, unspecified: Secondary | ICD-10-CM | POA: Diagnosis not present

## 2019-04-27 DIAGNOSIS — Z85828 Personal history of other malignant neoplasm of skin: Secondary | ICD-10-CM | POA: Diagnosis not present

## 2019-04-27 DIAGNOSIS — L578 Other skin changes due to chronic exposure to nonionizing radiation: Secondary | ICD-10-CM | POA: Diagnosis not present

## 2019-04-27 DIAGNOSIS — L570 Actinic keratosis: Secondary | ICD-10-CM | POA: Diagnosis not present

## 2019-04-27 DIAGNOSIS — L988 Other specified disorders of the skin and subcutaneous tissue: Secondary | ICD-10-CM | POA: Diagnosis not present

## 2019-04-29 ENCOUNTER — Other Ambulatory Visit: Payer: Self-pay

## 2019-04-29 ENCOUNTER — Emergency Department (HOSPITAL_COMMUNITY)
Admission: EM | Admit: 2019-04-29 | Discharge: 2019-04-29 | Disposition: A | Discharge status: Home / Self Care | Payer: Medicare Other | Attending: Emergency Medicine | Admitting: Emergency Medicine

## 2019-04-29 ENCOUNTER — Encounter (HOSPITAL_COMMUNITY): Payer: Self-pay | Admitting: Emergency Medicine

## 2019-04-29 ENCOUNTER — Emergency Department (HOSPITAL_COMMUNITY): Payer: Medicare Other

## 2019-04-29 DIAGNOSIS — E119 Type 2 diabetes mellitus without complications: Secondary | ICD-10-CM | POA: Insufficient documentation

## 2019-04-29 DIAGNOSIS — Z7984 Long term (current) use of oral hypoglycemic drugs: Secondary | ICD-10-CM | POA: Insufficient documentation

## 2019-04-29 DIAGNOSIS — Z85828 Personal history of other malignant neoplasm of skin: Secondary | ICD-10-CM | POA: Diagnosis not present

## 2019-04-29 DIAGNOSIS — Z7982 Long term (current) use of aspirin: Secondary | ICD-10-CM | POA: Insufficient documentation

## 2019-04-29 DIAGNOSIS — I252 Old myocardial infarction: Secondary | ICD-10-CM | POA: Diagnosis not present

## 2019-04-29 DIAGNOSIS — R0789 Other chest pain: Secondary | ICD-10-CM | POA: Insufficient documentation

## 2019-04-29 DIAGNOSIS — R079 Chest pain, unspecified: Secondary | ICD-10-CM | POA: Diagnosis not present

## 2019-04-29 DIAGNOSIS — R072 Precordial pain: Secondary | ICD-10-CM

## 2019-04-29 DIAGNOSIS — Z79899 Other long term (current) drug therapy: Secondary | ICD-10-CM | POA: Insufficient documentation

## 2019-04-29 DIAGNOSIS — I10 Essential (primary) hypertension: Secondary | ICD-10-CM | POA: Diagnosis not present

## 2019-04-29 LAB — COMPREHENSIVE METABOLIC PANEL
ALT: 13 U/L (ref 0–44)
AST: 21 U/L (ref 15–41)
Albumin: 4.3 g/dL (ref 3.5–5.0)
Alkaline Phosphatase: 55 U/L (ref 38–126)
Anion gap: 11 (ref 5–15)
BUN: 30 mg/dL — ABNORMAL HIGH (ref 8–23)
CO2: 24 mmol/L (ref 22–32)
Calcium: 9.6 mg/dL (ref 8.9–10.3)
Chloride: 102 mmol/L (ref 98–111)
Creatinine, Ser: 1.57 mg/dL — ABNORMAL HIGH (ref 0.61–1.24)
GFR calc Af Amer: 46 mL/min — ABNORMAL LOW (ref >60)
GFR calc non Af Amer: 40 mL/min — ABNORMAL LOW (ref >60)
Glucose, Bld: 188 mg/dL — ABNORMAL HIGH (ref 70–99)
Potassium: 4.6 mmol/L (ref 3.5–5.1)
Sodium: 137 mmol/L (ref 135–145)
Total Bilirubin: 0.8 mg/dL (ref 0.3–1.2)
Total Protein: 6.9 g/dL (ref 6.5–8.1)

## 2019-04-29 LAB — I-STAT TROPONIN, ED: Troponin i, poc: 0.01 ng/mL (ref 0.00–0.08)

## 2019-04-29 LAB — CBC
HCT: 39.6 % (ref 39.0–52.0)
Hemoglobin: 13 g/dL (ref 13.0–17.0)
MCH: 33.6 pg (ref 26.0–34.0)
MCHC: 32.8 g/dL (ref 30.0–36.0)
MCV: 102.3 fL — ABNORMAL HIGH (ref 80.0–100.0)
Platelets: 194 10*3/uL (ref 150–400)
RBC: 3.87 MIL/uL — ABNORMAL LOW (ref 4.22–5.81)
RDW: 12.1 % (ref 11.5–15.5)
WBC: 7.4 10*3/uL (ref 4.0–10.5)
nRBC: 0 % (ref 0.0–0.2)

## 2019-04-29 NOTE — Discharge Instructions (Signed)
It was our pleasure to provide your ER care today - we hope that you feel better.  Follow up with your cardiologist in the coming week - call office to arrange appointment. Also have your blood pressure rechecked then, as it is high today.   Return to ER if worse, new symptoms, recurrent or persistent chest pain, trouble breathing, other concern.

## 2019-04-29 NOTE — ED Notes (Signed)
Pt verbalized understanding of discharge paperwork and follow-up care.  °

## 2019-04-29 NOTE — ED Provider Notes (Signed)
[facility] EMERGENCY DEPARTMENT Provider Note   CSN: [id number] Arrival date & time: 04/29/19  1056     History   Chief Complaint Chief Complaint  Patient presents with  . Chest Pain    HPI Dason Mosley is a 85 y.o. male.     Patient c/o episodes sharp, brief, localized pain just to right of lower sternum for past 2-3 days. Symptoms occur at rest, acute onset, last seconds, non radiating. No associated palpitations, sob, nv, or diaphoresis. No constant and/or pleuritic pain. No exertional cp or discomfort. No unusual doe. Denies cough or uri symptoms. No fever or chills. No heartburn.   The history is provided by the patient.  Chest Pain Associated symptoms: no abdominal pain, no back pain, no fever, no headache, no nausea, no shortness of breath and no vomiting     Past Medical History:  Diagnosis Date  . Cancer [facility])    SKIN CANCER   . Cataract   . Diabetes mellitus without complication ([place])   . Elbow effusion, left   . Frequency of urination   . GERD (gastroesophageal reflux disease)   . Heart attack ([place])   . Heart disease   . Hypertension   . Primary osteoarthritis of right shoulder 01/24/2015  . Prostate enlargement   . Tremors of nervous system   . Urinary retention due to benign prostatic hyperplasia 01/25/2015    Patient Active Problem List   Diagnosis Date Noted  . High risk medication use 01/15/2019  . Venous insufficiency of both lower extremities 11/27/2018  . Tremor of both hands-  both wiht rest and activity; acutely worse 11/27/2018  . Orthostatic hypotension 11/05/2018  . Cancer ([place]) 07/27/2018  . Folliculitis- scalp; txed by Derm- Dr Jarome Matin 07/27/2018  . Bilateral lower extremity edema 11/17/2017  . Hypertension associated with diabetes ([place]) 08/05/2017  . Mixed diabetic hyperlipidemia associated with type 2 diabetes mellitus ([place]) 08/05/2017  . Microalbuminuria due to type 2 diabetes mellitus ([place]) 08/05/2017  . RLS  (restless legs syndrome) 06/17/2017  . GERD (gastroesophageal reflux disease) 05/05/2017  . Glossodynia 05/05/2017  . Costochondritis 05/05/2017  . Type 2 diabetes mellitus with stage 3 chronic kidney disease, without long-term current use of insulin ([place]) 05/05/2017  . Stage 3 chronic kidney disease ([place]) 04/22/2017  . Abdominal pain 03/25/2017  . CAD (coronary artery disease) 03/25/2017  . Diabetes mellitus without complication ([place]) 24/58/0998  . Snoring 03/25/2017  . Urinary retention due to benign prostatic hyperplasia 01/25/2015  . Primary osteoarthritis of right shoulder 01/24/2015  . Edema, peripheral 11/22/2014    Past Surgical History:  Procedure Laterality Date  . CARDIAC CATHETERIZATION     1999  . cardio stint    . HERNIA REPAIR     BILAT   . MOHS SURGERY     BILAT EARS   . PROSTATE BIOPSY    . TOTAL SHOULDER ARTHROPLASTY Right 01/24/2015   Procedure: RIGHT TOTAL SHOULDER ARTHROPLASTY;  Surgeon: Marchia Bond, MD;  Location: [facility];  Service: Orthopedics;  Laterality: Right;        Home Medications    Prior to Admission medications   Medication Sig Start Date End Date Taking? Authorizing Provider  aspirin 81 MG tablet Take 81 mg by mouth daily.    [provider]  atorvastatin (LIPITOR) 10 MG tablet Take 1 tablet (10 mg total) by mouth daily. 04/13/19   Opalski, Deborah, DO  Cobalamine Combinations (P-38 + FOLIC ACID PO) Take 1 capsule by  mouth daily.     [provider]  glimepiride (AMARYL) 1 MG tablet Take 1 tablet (1 mg total) by mouth daily with breakfast. 03/03/19   Opalski, Neoma Laming, DO  losartan (COZAAR) 25 MG tablet TAKE 1 TABLET DAILY Patient taking differently: Take 25 mg by mouth daily.  09/21/18   Mellody Dance, DO  metFORMIN (GLUCOPHAGE) 500 MG tablet Take 1 tablet (500 mg total) by mouth 2 (two) times daily with a meal. 03/03/19   Opalski, Deborah, DO  polyethylene glycol (MIRALAX / GLYCOLAX) packet Take 17 g by mouth daily.     [provider]    Family History Family History  Problem Relation Age of Onset  . Hypertension Brother     Social History Social History   Tobacco Use  . Smoking status: Never Smoker  . Smokeless tobacco: Never Used  Substance Use Topics  . Alcohol use: No  . Drug use: No     Allergies   Exenatide, Meperidine and related, Nadolol, and Sitagliptin phosphate [sitagliptin]   Review of Systems Review of Systems  Constitutional: Negative for fever.  HENT: Negative for sore throat.   Eyes: Negative for redness.  Respiratory: Negative for shortness of breath.   Cardiovascular: Positive for chest pain.  Gastrointestinal: Negative for abdominal pain, nausea and vomiting.  Genitourinary: Negative for flank pain.  Musculoskeletal: Negative for back pain and neck pain.  Skin: Negative for rash.  Neurological: Negative for headaches.  Hematological: Does not bruise/bleed easily.  Psychiatric/Behavioral: Negative for confusion.     Physical Exam Updated Vital Signs BP (!) 173/89   Pulse 82   Temp (!) 97.5 F (36.4 C) (Oral)   Resp (!) 22   Ht 1.829 m (6')   Wt 72.6 kg   SpO2 99%   BMI 21.70 kg/m   Physical Exam Vitals signs and nursing note reviewed.  Constitutional:      Appearance: Normal appearance. He is well-developed.  HENT:     Head: Atraumatic.     Nose: Nose normal.     Mouth/Throat:     Mouth: Mucous membranes are moist.     Pharynx: Oropharynx is clear.  Eyes:     General: No scleral icterus.    Conjunctiva/sclera: Conjunctivae normal.  Neck:     Musculoskeletal: Normal range of motion and neck supple. No neck rigidity.     Trachea: No tracheal deviation.  Cardiovascular:     Rate and Rhythm: Normal rate and regular rhythm.     Pulses: Normal pulses.     Heart sounds: Normal heart sounds. No murmur. No friction rub. No gallop.   Pulmonary:     Effort: Pulmonary effort is normal. No accessory muscle usage or respiratory distress.      Breath sounds: Normal breath sounds.  Abdominal:     General: Bowel sounds are normal. There is no distension.     Palpations: Abdomen is soft.     Tenderness: There is no abdominal tenderness. There is no guarding.  Genitourinary:    Comments: No cva tenderness. Musculoskeletal:        General: No swelling or tenderness.  Skin:    General: Skin is warm and dry.     Findings: No rash.  Neurological:     Mental Status: He is alert.     Comments: Alert, speech clear.   Psychiatric:        Mood and Affect: Mood normal.      ED Treatments / Results  Labs (all  labs ordered are listed, but only abnormal results are displayed) Results for orders placed or performed during the hospital encounter of 04/29/19  CBC  Result Value Ref Range   WBC 7.4 4.0 - 10.5 K/uL   RBC 3.87 (L) 4.22 - 5.81 MIL/uL   Hemoglobin 13.0 13.0 - 17.0 g/dL   HCT 39.6 39.0 - 52.0 %   MCV 102.3 (H) 80.0 - 100.0 fL   MCH 33.6 26.0 - 34.0 pg   MCHC 32.8 30.0 - 36.0 g/dL   RDW 12.1 11.5 - 15.5 %   Platelets 194 150 - 400 K/uL   nRBC 0.0 0.0 - 0.2 %  Comprehensive metabolic panel  Result Value Ref Range   Sodium 137 135 - 145 mmol/L   Potassium 4.6 3.5 - 5.1 mmol/L   Chloride 102 98 - 111 mmol/L   CO2 24 22 - 32 mmol/L   Glucose, Bld 188 (H) 70 - 99 mg/dL   BUN 30 (H) 8 - 23 mg/dL   Creatinine, Ser 1.57 (H) 0.61 - 1.24 mg/dL   Calcium 9.6 8.9 - 10.3 mg/dL   Total Protein 6.9 6.5 - 8.1 g/dL   Albumin 4.3 3.5 - 5.0 g/dL   AST 21 15 - 41 U/L   ALT 13 0 - 44 U/L   Alkaline Phosphatase 55 38 - 126 U/L   Total Bilirubin 0.8 0.3 - 1.2 mg/dL   GFR calc non Af Amer 40 (L) >60 mL/min   GFR calc Af Amer 46 (L) >60 mL/min   Anion gap 11 5 - 15  I-stat troponin, ED  Result Value Ref Range   Troponin i, poc 0.01 0.00 - 0.08 ng/mL   Comment 3           Dg Chest Port 1 View  Result Date: 04/29/2019 CLINICAL DATA:  Chest pain and hypertension EXAM: PORTABLE CHEST 1 VIEW COMPARISON:  Chest radiograph March 03, 2017; chest radiograph March 07, 2017 FINDINGS: There is no edema or consolidation. Heart size and pulmonary vascularity are normal. No adenopathy. No pneumothorax. There is a total shoulder replacement on the right. There is degenerative change in the left shoulder. There is also degenerative change in the thoracic spine. IMPRESSION: No edema or consolidation.  Stable cardiac silhouette. Electronically Signed   By: Lowella Grip III M.D.   On: 04/29/2019 11:49    EKG EKG Interpretation  Date/Time:  Thursday April 29 2019 11:10:16 EDT Ventricular Rate:  80 PR Interval:    QRS Duration: 83 QT Interval:  357 QTC Calculation: 412 R Axis:   52 Text Interpretation:  Sinus rhythm Ventricular premature complex No previous tracing Confirmed by Lajean Saver [phone]) on 04/29/2019 11:14:36 AM   Radiology Dg Chest Port 1 View  Result Date: 04/29/2019 CLINICAL DATA:  Chest pain and hypertension EXAM: PORTABLE CHEST 1 VIEW COMPARISON:  Chest radiograph March 03, 2017; chest radiograph March 07, 2017 FINDINGS: There is no edema or consolidation. Heart size and pulmonary vascularity are normal. No adenopathy. No pneumothorax. There is a total shoulder replacement on the right. There is degenerative change in the left shoulder. There is also degenerative change in the thoracic spine. IMPRESSION: No edema or consolidation.  Stable cardiac silhouette. Electronically Signed   By: Lowella Grip III M.D.   On: 04/29/2019 11:49    Procedures Procedures (including critical care time)  Medications Ordered in ED Medications - No data to display   Initial Impression / Assessment and Plan / ED Course  I  have reviewed the triage vital signs and the nursing notes.  Pertinent labs & imaging results that were available during my care of the patient were reviewed by me and considered in my medical decision making (see chart for details).  Iv ns. Ecg. Cxr.   Reviewed nursing notes and prior charts for  additional history.   Labs reviewed by me - after symptoms for past 24 hrs, trop is normal. Episodes last only a couple seconds - felt not c/w ACS.  Pt currently without any chest pain or sob.   rec outpt f/u/card and/or pcp.  Return precautions provided.   Pt currently symptom free and appears stable for d/c.      Final Clinical Impressions(s) / ED Diagnoses   Final diagnoses:  None    ED Discharge Orders    None       Lajean Saver, MD 04/29/19 1425

## 2019-04-29 NOTE — ED Triage Notes (Signed)
Pt arrives via POV with reports of chest discomfort that started yesterday. Denies any SOB. States he had a MI in Jan 99.

## 2019-05-07 ENCOUNTER — Ambulatory Visit (INDEPENDENT_AMBULATORY_CARE_PROVIDER_SITE_OTHER): Payer: Medicare Other | Admitting: Cardiology

## 2019-05-07 ENCOUNTER — Other Ambulatory Visit: Payer: Self-pay

## 2019-05-07 ENCOUNTER — Encounter: Payer: Self-pay | Admitting: Cardiology

## 2019-05-07 VITALS — BP 162/78 | HR 74 | Ht 72.0 in | Wt 152.0 lb

## 2019-05-07 DIAGNOSIS — E1122 Type 2 diabetes mellitus with diabetic chronic kidney disease: Secondary | ICD-10-CM

## 2019-05-07 DIAGNOSIS — N183 Chronic kidney disease, stage 3 unspecified: Secondary | ICD-10-CM

## 2019-05-07 DIAGNOSIS — I951 Orthostatic hypotension: Secondary | ICD-10-CM | POA: Diagnosis not present

## 2019-05-07 DIAGNOSIS — I2510 Atherosclerotic heart disease of native coronary artery without angina pectoris: Secondary | ICD-10-CM

## 2019-05-07 DIAGNOSIS — R0789 Other chest pain: Secondary | ICD-10-CM

## 2019-05-07 HISTORY — DX: Other chest pain: R07.89

## 2019-05-07 NOTE — Progress Notes (Signed)
Cardiology Office Note:    Date:  05/07/2019   ID:  George Barnett, DOB 10/29/1933, MRN [id number]  PCP:  Mellody Dance, DO  Cardiologist:  Jenne Campus, MD    Referring MD: Mellody Dance, DO   Chief Complaint  Patient presents with  . Follow up from [facility] ED  I was in the emergency room because of chest pain  History of Present Illness:    Joeziah Voit is a 85 y.o. male with past medical history significant for coronary artery disease in 1999 he got small myocardial infarction that required stenting however I do not have details about that event.  Interestingly before that episodes he did not have much chest pain.  Apparently he did have a stress test and then he was rash after stress test to cardiac cath laboratory and after he has been fixed.  I am seeing him for orthostatic hypotension.  He also does have diabetes as well as dyslipidemia and essential hypertension recently he ended up going to the emergency room because of atypical chest pain he thinks this is costochondritis but the pain was located in the middle of the sternum not reproducible by pressing chest wall not reproducible by twisting his body.  Lasted for only few seconds at the time.  Overall he likes to be outdoors however because of some chronic skin condition on his head he is not able to do it denies having any typical exertional chest pain tightness squeezing pressure burning chest.  Work-up in the emergency room was negative and he was discharged home  Past Medical History:  Diagnosis Date  . Cancer [facility])    SKIN CANCER   . Cataract   . Diabetes mellitus without complication ([place])   . Elbow effusion, left   . Frequency of urination   . GERD (gastroesophageal reflux disease)   . Heart attack ([place])   . Heart disease   . Hypertension   . Primary osteoarthritis of right shoulder 01/24/2015  . Prostate enlargement   . Tremors of nervous system   . Urinary retention due to benign prostatic hyperplasia  01/25/2015    Past Surgical History:  Procedure Laterality Date  . CARDIAC CATHETERIZATION     1999  . cardio stint    . HERNIA REPAIR     BILAT   . MOHS SURGERY     BILAT EARS   . PROSTATE BIOPSY    . TOTAL SHOULDER ARTHROPLASTY Right 01/24/2015   Procedure: RIGHT TOTAL SHOULDER ARTHROPLASTY;  Surgeon: Marchia Bond, MD;  Location: [facility];  Service: Orthopedics;  Laterality: Right;    Current Medications: Current Meds  Medication Sig  . aspirin 81 MG tablet Take 81 mg by mouth every evening.   Marland Kitchen atorvastatin (LIPITOR) 10 MG tablet Take 1 tablet (10 mg total) by mouth daily. (Patient taking differently: Take 10-15 mg by mouth See admin instructions. Take 1 tablet (10mg ) on Tues, Thur, Sat, and Sun. Take 1 1/2 tablet (15mg ) on Mon, Wed, and Fri)  . Cobalamine Combinations (U-27 + FOLIC ACID PO) Take 1 capsule by mouth every evening.   Marland Kitchen glimepiride (AMARYL) 1 MG tablet Take 1 tablet (1 mg total) by mouth daily with breakfast.  . losartan (COZAAR) 25 MG tablet TAKE 1 TABLET DAILY (Patient taking differently: Take 25 mg by mouth daily. )  . metFORMIN (GLUCOPHAGE) 500 MG tablet Take 1 tablet (500 mg total) by mouth 2 (two) times daily with a meal.  . Polyethyl Glycol-Propyl Glycol (SYSTANE OP) Place 1  drop into both eyes daily.  . polyethylene glycol (MIRALAX / GLYCOLAX) packet Take 17 g by mouth daily as needed for mild constipation.   . Polyethylene Glycol 400 (BLINK TEARS OP) Place 1 drop into both eyes 4 (four) times daily as needed (dry eyes).  . Probiotic Product (PROBIOTIC DAILY PO) Take 2 capsules by mouth daily with lunch.     Allergies:   Exenatide, Meperidine and related, Nadolol, and Sitagliptin phosphate [sitagliptin]   Social History   Socioeconomic History  . Marital status: Married    Spouse name: Not on file  . Number of children: Not on file  . Years of education: Not on file  . Highest education level: Not on file  Occupational History  . Not on file  Social  Needs  . Financial resource strain: Not on file  . Food insecurity    Worry: Not on file    Inability: Not on file  . Transportation needs    Medical: Not on file    Non-medical: Not on file  Tobacco Use  . Smoking status: Never Smoker  . Smokeless tobacco: Never Used  Substance and Sexual Activity  . Alcohol use: No  . Drug use: No  . Sexual activity: Not on file  Lifestyle  . Physical activity    Days per week: Not on file    Minutes per session: Not on file  . Stress: Not on file  Relationships  . Social Herbalist on phone: Not on file    Gets together: Not on file    Attends religious service: Not on file    Active member of club or organization: Not on file    Attends meetings of clubs or organizations: Not on file    Relationship status: Not on file  Other Topics Concern  . Not on file  Social History Narrative  . Not on file     Family History: The patient's family history includes Hypertension in his brother. ROS:   Please see the history of present illness.    All 14 point review of systems negative except as described per history of present illness  EKGs/Labs/Other Studies Reviewed:      Recent Labs: 08/25/2018: NT-Pro BNP 368 02/01/2019: Magnesium 1.6 04/29/2019: ALT 13; BUN 30; Creatinine, Ser 1.57; Hemoglobin 13.0; Platelets 194; Potassium 4.6; Sodium 137  Recent Lipid Panel    Component Value Date/Time   CHOL 105 03/17/2018 0926   TRIG 52 03/17/2018 0926   HDL 49 03/17/2018 0926   CHOLHDL 2.1 03/17/2018 0926   LDLCALC 46 03/17/2018 0926    Physical Exam:    VS:  BP (!) 162/78   Pulse 74   Ht 6' (1.829 m)   Wt 152 lb (68.9 kg)   SpO2 97%   BMI 20.61 kg/m     Wt Readings from Last 3 Encounters:  05/07/19 152 lb (68.9 kg)  04/29/19 160 lb (72.6 kg)  03/23/19 160 lb (72.6 kg)     GEN:  Well nourished, well developed in no acute distress HEENT: Normal NECK: No JVD; No carotid bruits LYMPHATICS: No lymphadenopathy CARDIAC:  RRR, no murmurs, no rubs, no gallops RESPIRATORY:  Clear to auscultation without rales, wheezing or rhonchi  ABDOMEN: Soft, non-tender, non-distended MUSCULOSKELETAL:  No edema; No deformity  SKIN: Warm and dry LOWER EXTREMITIES: no swelling NEUROLOGIC:  Alert and oriented x 3 PSYCHIATRIC:  Normal affect   ASSESSMENT:    1. Coronary artery disease involving native coronary  artery of native heart without angina pectoris   2. Orthostatic hypotension   3. Type 2 diabetes mellitus with stage 3 chronic kidney disease, without long-term current use of insulin ([place])   4. Atypical chest pain    PLAN:    In order of problems listed above:  1. Coronary artery disease with PTCA and stenting done in 1999, recently he ended going to the hospital because of atypical chest pain.  He did not have typical symptoms before his angioplasty and stenting in 1999.  Because of significant risk factors for coronary artery disease and already present coronary artery disease I think will be appropriate to do stress test.  He be scheduled to have [place].  He is scheduled to have surgery for his hernia and I think it would be prudent to do the test before that surgery. 2. Essential hypertension blood pressure appears to be elevated today but when he check it at home it is much better controlled.  Therefore I will not alter any medication at the moment. 3. Diabetes mellitus well controlled followed by internal medicine team. 4. Dyslipidemia last fasting lipid profile from year ago was excellent show LDL of 46 HDL 49.  We will continue present management and arrangements will be made to check his fasting lipid profile   Medication Adjustments/Labs and Tests Ordered: Current medicines are reviewed at length with the patient today.  Concerns regarding medicines are outlined above.  No orders of the defined types were placed in this encounter.  Medication changes: No orders of the defined types were placed in this  encounter.   Signed, Park Liter, MD, [facility] 05/07/2019 8:51 AM    [place]

## 2019-05-07 NOTE — Patient Instructions (Signed)
Medication Instructions:  Your physician recommends that you continue on your current medications as directed. Please refer to the Current Medication list given to you today.  If you need a refill on your cardiac medications before your next appointment, please call your pharmacy.   Lab work: None.  If you have labs (blood work) drawn today and your tests are completely normal, you will receive your results only by: Marland Kitchen MyChart Message (if you have MyChart) OR . A paper copy in the mail If you have any lab test that is abnormal or we need to change your treatment, we will call you to review the results.  Testing/Procedures: Your physician has requested that you have a lexiscan myoview. For further information please visit [email or web address]. Please follow instruction sheet, as given.    Follow-Up: At [facility], you and your health needs are our priority.  As part of our continuing mission to provide you with exceptional heart care, we have created designated Provider Care Teams.  These Care Teams include your primary Cardiologist (physician) and Advanced Practice Providers (APPs -  Physician Assistants and Nurse Practitioners) who all work together to provide you with the care you need, when you need it. You will need a follow up appointment in 3 months.  Please call our office 2 months in advance to schedule this appointment.  You may see No primary care provider on file. or another member of our [employer] Provider Team in [place]: Shirlee More, MD . Jyl Heinz, MD  Any Other Special Instructions Will Be Listed Below (If Applicable).   Cardiac Nuclear Scan A cardiac nuclear scan is a test that measures blood flow to the heart when a person is resting and when he or she is exercising. The test looks for problems such as:  Not enough blood reaching a portion of the heart.  The heart muscle not working normally. You may need this test if:  You have heart disease.  You  have had abnormal lab results.  You have had heart surgery or a balloon procedure to open up blocked arteries (angioplasty).  You have chest pain.  You have shortness of breath. In this test, a radioactive dye (tracer) is injected into your bloodstream. After the tracer has traveled to your heart, an imaging device is used to measure how much of the tracer is absorbed by or distributed to various areas of your heart. This procedure is usually done at a hospital and takes 2-4 hours. Tell a health care provider about:  Any allergies you have.  All medicines you are taking, including vitamins, herbs, eye drops, creams, and over-the-counter medicines.  Any problems you or family members have had with anesthetic medicines.  Any blood disorders you have.  Any surgeries you have had.  Any medical conditions you have.  Whether you are pregnant or may be pregnant. What are the risks? Generally, this is a safe procedure. However, problems may occur, including:  Serious chest pain and heart attack. This is only a risk if the stress portion of the test is done.  Rapid heartbeat.  Sensation of warmth in your chest. This usually passes quickly.  Allergic reaction to the tracer. What happens before the procedure?  Ask your health care provider about changing or stopping your regular medicines. This is especially important if you are taking diabetes medicines or blood thinners.  Follow instructions from your health care provider about eating or drinking restrictions.  Remove your jewelry on the day of the procedure.  What happens during the procedure?  An IV will be inserted into one of your veins.  Your health care provider will inject a small amount of radioactive tracer through the IV.  You will wait for 20-40 minutes while the tracer travels through your bloodstream.  Your heart activity will be monitored with an electrocardiogram (ECG).  You will lie down on an exam table.   Images of your heart will be taken for about 15-20 minutes.  You may also have a stress test. For this test, one of the following may be done: ? You will exercise on a treadmill or stationary bike. While you exercise, your heart's activity will be monitored with an ECG, and your blood pressure will be checked. ? You will be given medicines that will increase blood flow to parts of your heart. This is done if you are unable to exercise.  When blood flow to your heart has peaked, a tracer will again be injected through the IV.  After 20-40 minutes, you will get back on the exam table and have more images taken of your heart.  Depending on the type of tracer used, scans may need to be repeated 3-4 hours later.  Your IV line will be removed when the procedure is over. The procedure may vary among health care providers and hospitals. What happens after the procedure?  Unless your health care provider tells you otherwise, you may return to your normal schedule, including diet, activities, and medicines.  Unless your health care provider tells you otherwise, you may increase your fluid intake. This will help to flush the contrast dye from your body. Drink enough fluid to keep your urine pale yellow.  Ask your health care provider, or the department that is doing the test: ? When will my results be ready? ? How will I get my results? Summary  A cardiac nuclear scan measures the blood flow to the heart when a person is resting and when he or she is exercising.  Tell your health care provider if you are pregnant.  Before the procedure, ask your health care provider about changing or stopping your regular medicines. This is especially important if you are taking diabetes medicines or blood thinners.  After the procedure, unless your health care provider tells you otherwise, increase your fluid intake. This will help flush the contrast dye from your body.  After the procedure, unless your health  care provider tells you otherwise, you may return to your normal schedule, including diet, activities, and medicines. This information is not intended to replace advice given to you by your health care provider. Make sure you discuss any questions you have with your health care provider. Document Released: 11/29/2004 Document Revised: 04/20/2018 Document Reviewed: 04/20/2018 Elsevier Interactive Patient Education  2019 [employer].

## 2019-05-11 ENCOUNTER — Telehealth (HOSPITAL_COMMUNITY): Payer: Self-pay | Admitting: *Deleted

## 2019-05-11 NOTE — Telephone Encounter (Signed)
Patient given detailed instructions per Myocardial Perfusion Study Information Sheet for the test on 05/13/19. Patient notified to arrive 15 minutes early and that it is imperative to arrive on time for appointment to keep from having the test rescheduled.  If you need to cancel or reschedule your appointment, please call the office within 24 hours of your appointment. . Patient verbalized understanding. George Barnett

## 2019-05-13 ENCOUNTER — Encounter (HOSPITAL_COMMUNITY): Payer: Self-pay

## 2019-05-13 ENCOUNTER — Ambulatory Visit (HOSPITAL_COMMUNITY): Payer: Medicare Other | Attending: Internal Medicine

## 2019-05-13 ENCOUNTER — Other Ambulatory Visit: Payer: Self-pay

## 2019-05-13 ENCOUNTER — Encounter (HOSPITAL_COMMUNITY)
Admission: RE | Disposition: A | Discharge status: Home / Self Care | Payer: Self-pay | Source: Home / Self Care | Attending: Surgery

## 2019-05-13 VITALS — Ht 70.0 in | Wt 152.0 lb

## 2019-05-13 DIAGNOSIS — I2510 Atherosclerotic heart disease of native coronary artery without angina pectoris: Secondary | ICD-10-CM

## 2019-05-13 DIAGNOSIS — R0789 Other chest pain: Secondary | ICD-10-CM | POA: Diagnosis not present

## 2019-05-13 LAB — MYOCARDIAL PERFUSION IMAGING
LV dias vol: 69 mL (ref 62–150)
LV sys vol: 21 mL
Peak HR: 80 {beats}/min
Rest HR: 66 {beats}/min
SDS: 0
SRS: 0
SSS: 0
TID: 0.96

## 2019-05-13 SURGERY — REPAIR, HERNIA, INGUINAL, ADULT
Anesthesia: General | Laterality: Right

## 2019-05-13 MED ORDER — REGADENOSON 0.4 MG/5ML IV SOLN
0.4000 mg | Freq: Once | INTRAVENOUS | Status: AC
  Administered 2019-05-13: 0.4 mg via INTRAVENOUS

## 2019-05-13 MED ORDER — TECHNETIUM TC 99M TETROFOSMIN IV KIT
11.0000 | PACK | Freq: Once | INTRAVENOUS | Status: AC | PRN
  Administered 2019-05-13: 11 via INTRAVENOUS
  Filled 2019-05-13: qty 11

## 2019-05-13 MED ORDER — TECHNETIUM TC 99M TETROFOSMIN IV KIT
31.2000 | PACK | Freq: Once | INTRAVENOUS | Status: AC | PRN
  Administered 2019-05-13: 31.2 via INTRAVENOUS
  Filled 2019-05-13: qty 32

## 2019-05-13 NOTE — Progress Notes (Signed)
[place], [place] [address] Phone: [phone] Fax: [phone]  [facility] - [address], [place] [address] Phone: [phone] Fax: [phone]  [facility] [place], [place] [address] [place] [address] Phone: [phone] Fax: [phone]      Your procedure is scheduled on June 30th.  Report to [facility] Main Entrance "A" at 5:30 A.M., and check in at the Admitting office.  Call this number if you have problems the morning of surgery:  [phone]  Call [phone] if you have any questions prior to your surgery date Monday-Friday 8am-4pm    Remember:  Do not eat or drink after midnight.    Take these medicines the morning of surgery with A SIP OF WATER   Atorvastatin (Lipitor)  Eye drops - if needed  miralax - if needed  Follow your surgeon's instructions on when to stop Aspirin.  If no instructions were given by your surgeon then you will need to call the office to get those instructions.    7 days prior to surgery STOP taking any Aspirin (unless otherwise instructed by your surgeon), Aleve, Naproxen, Ibuprofen, Motrin, Advil, Goody's, BC's, all herbal medications, fish oil, and all vitamins.   WHAT DO I DO ABOUT MY DIABETES MEDICATION?   Marland Kitchen Do not take oral diabetes medicines (pills) the morning of surgery. - Glimepiride, Metformin    How to Manage Your Diabetes Before and After Surgery  Why is it important to control my blood sugar before and after surgery? . Improving blood sugar levels before and after surgery helps healing and can limit problems. . A way of improving blood sugar control is eating a healthy diet by: o  Eating less sugar and carbohydrates o  Increasing activity/exercise o  Talking with your doctor about reaching your blood sugar  goals . High blood sugars (greater than 180 mg/dL) can raise your risk of infections and slow your recovery, so you will need to focus on controlling your diabetes during the weeks before surgery. . Make sure that the doctor who takes care of your diabetes knows about your planned surgery including the date and location.  How do I manage my blood sugar before surgery? . Check your blood sugar at least 4 times a day, starting 2 days before surgery, to make sure that the level is not too high or low. o Check your blood sugar the morning of your surgery when you wake up and every 2 hours until you get to the Short Stay unit. . If your blood sugar is less than 70 mg/dL, you will need to treat for low blood sugar: o Do not take insulin. o Treat a low blood sugar (less than 70 mg/dL) with  cup of clear juice (cranberry or apple), 4 glucose tablets, OR glucose gel. o Recheck blood sugar in 15 minutes after treatment (to make sure it is greater than 70 mg/dL). If your blood sugar is not greater than 70 mg/dL on recheck, call [phone] for further instructions. . Report your blood sugar to the short stay nurse when you get to Short Stay.  . If you are admitted to the hospital after surgery: o Your blood sugar will be checked by the staff and you will probably be given insulin after surgery (instead of oral  diabetes medicines) to make sure you have good blood sugar levels. o The goal for blood sugar control after surgery is 80-180 mg/dL.    The Morning of Surgery  Do not wear jewelry.  Do not wear lotions, powders, or perfumes/colognes, or deodorant  Do not shave 48 hours prior to surgery.  Men may shave face and neck.  Do not bring valuables to the hospital.  [facility] is not responsible for any belongings or valuables.  If you are a smoker, DO NOT Smoke 24 hours prior to surgery IF you wear a CPAP at night please bring your mask, tubing, and machine the morning of surgery   Remember that  you must have someone to transport you home after your surgery, and remain with you for 24 hours if you are discharged the same day.   Contacts, glasses, hearing aids, dentures or bridgework may not be worn into surgery.    Leave your suitcase in the car.  After surgery it may be brought to your room.  For patients admitted to the hospital, discharge time will be determined by your treatment team.  Patients discharged the day of surgery will not be allowed to drive home.    Special instructions:   [facility]- Preparing For Surgery  Before surgery, you can play an important role. Because skin is not sterile, your skin needs to be as free of germs as possible. You can reduce the number of germs on your skin by washing with CHG (chlorahexidine gluconate) Soap before surgery.  CHG is an antiseptic cleaner which kills germs and bonds with the skin to continue killing germs even after washing.    Oral Hygiene is also important to reduce your risk of infection.  Remember - BRUSH YOUR TEETH THE MORNING OF SURGERY WITH YOUR REGULAR TOOTHPASTE  Please do not use if you have an allergy to CHG or antibacterial soaps. If your skin becomes reddened/irritated stop using the CHG.  Do not shave (including legs and underarms) for at least 48 hours prior to first CHG shower. It is OK to shave your face.  Please follow these instructions carefully.   1. Shower the NIGHT BEFORE SURGERY and the MORNING OF SURGERY with CHG Soap.   2. If you chose to wash your hair, wash your hair first as usual with your normal shampoo.  3. After you shampoo, rinse your hair and body thoroughly to remove the shampoo.  4. Use CHG as you would any other liquid soap. You can apply CHG directly to the skin and wash gently with a scrungie or a clean washcloth.   5. Apply the CHG Soap to your body ONLY FROM THE NECK DOWN.  Do not use on open wounds or open sores. Avoid contact with your eyes, ears, mouth and genitals (private  parts). Wash Face and genitals (private parts)  with your normal soap.   6. Wash thoroughly, paying special attention to the area where your surgery will be performed.  7. Thoroughly rinse your body with warm water from the neck down.  8. DO NOT shower/wash with your normal soap after using and rinsing off the CHG Soap.  9. Pat yourself dry with a CLEAN TOWEL.  10. Wear CLEAN PAJAMAS to bed the night before surgery, wear comfortable clothes the morning of surgery  11. Place CLEAN SHEETS on your bed the night of your first shower and DO NOT SLEEP WITH PETS.    Day of Surgery:  Do not apply any deodorants/lotions.  Please wear clean clothes to the hospital/surgery center.   Remember to brush your teeth WITH YOUR REGULAR TOOTHPASTE.   Please read over the following fact sheets that you were given.

## 2019-05-14 ENCOUNTER — Other Ambulatory Visit (HOSPITAL_COMMUNITY)
Admission: RE | Admit: 2019-05-14 | Discharge: 2019-05-14 | Disposition: A | Discharge status: Home / Self Care | Payer: Medicare Other | Source: Ambulatory Visit | Attending: Surgery | Admitting: Surgery

## 2019-05-14 ENCOUNTER — Other Ambulatory Visit (HOSPITAL_COMMUNITY): Payer: Medicare Other

## 2019-05-14 ENCOUNTER — Encounter (HOSPITAL_COMMUNITY)
Admission: RE | Admit: 2019-05-14 | Discharge: 2019-05-14 | Disposition: A | Discharge status: Home / Self Care | Payer: Medicare Other | Source: Ambulatory Visit | Attending: Surgery | Admitting: Surgery

## 2019-05-14 ENCOUNTER — Encounter (HOSPITAL_COMMUNITY): Payer: Self-pay

## 2019-05-14 DIAGNOSIS — Z85828 Personal history of other malignant neoplasm of skin: Secondary | ICD-10-CM | POA: Insufficient documentation

## 2019-05-14 DIAGNOSIS — Z01812 Encounter for preprocedural laboratory examination: Secondary | ICD-10-CM | POA: Insufficient documentation

## 2019-05-14 DIAGNOSIS — Z79899 Other long term (current) drug therapy: Secondary | ICD-10-CM | POA: Insufficient documentation

## 2019-05-14 DIAGNOSIS — Z8673 Personal history of transient ischemic attack (TIA), and cerebral infarction without residual deficits: Secondary | ICD-10-CM | POA: Diagnosis not present

## 2019-05-14 DIAGNOSIS — K4090 Unilateral inguinal hernia, without obstruction or gangrene, not specified as recurrent: Secondary | ICD-10-CM | POA: Insufficient documentation

## 2019-05-14 DIAGNOSIS — N401 Enlarged prostate with lower urinary tract symptoms: Secondary | ICD-10-CM | POA: Insufficient documentation

## 2019-05-14 DIAGNOSIS — K219 Gastro-esophageal reflux disease without esophagitis: Secondary | ICD-10-CM | POA: Diagnosis not present

## 2019-05-14 DIAGNOSIS — Z1159 Encounter for screening for other viral diseases: Secondary | ICD-10-CM | POA: Insufficient documentation

## 2019-05-14 DIAGNOSIS — I10 Essential (primary) hypertension: Secondary | ICD-10-CM | POA: Diagnosis not present

## 2019-05-14 DIAGNOSIS — R338 Other retention of urine: Secondary | ICD-10-CM | POA: Insufficient documentation

## 2019-05-14 DIAGNOSIS — Z7982 Long term (current) use of aspirin: Secondary | ICD-10-CM | POA: Diagnosis not present

## 2019-05-14 DIAGNOSIS — Z7984 Long term (current) use of oral hypoglycemic drugs: Secondary | ICD-10-CM | POA: Diagnosis not present

## 2019-05-14 DIAGNOSIS — I2510 Atherosclerotic heart disease of native coronary artery without angina pectoris: Secondary | ICD-10-CM | POA: Insufficient documentation

## 2019-05-14 DIAGNOSIS — E119 Type 2 diabetes mellitus without complications: Secondary | ICD-10-CM | POA: Diagnosis not present

## 2019-05-14 LAB — CBC WITH DIFFERENTIAL/PLATELET
Abs Immature Granulocytes: 0.03 10*3/uL (ref 0.00–0.07)
Basophils Absolute: 0 10*3/uL (ref 0.0–0.1)
Basophils Relative: 1 %
Eosinophils Absolute: 0 10*3/uL (ref 0.0–0.5)
Eosinophils Relative: 0 %
HCT: 39.4 % (ref 39.0–52.0)
Hemoglobin: 12.7 g/dL — ABNORMAL LOW (ref 13.0–17.0)
Immature Granulocytes: 0 %
Lymphocytes Relative: 20 %
Lymphs Abs: 1.8 10*3/uL (ref 0.7–4.0)
MCH: 32.8 pg (ref 26.0–34.0)
MCHC: 32.2 g/dL (ref 30.0–36.0)
MCV: 101.8 fL — ABNORMAL HIGH (ref 80.0–100.0)
Monocytes Absolute: 0.8 10*3/uL (ref 0.1–1.0)
Monocytes Relative: 9 %
Neutro Abs: 6.1 10*3/uL (ref 1.7–7.7)
Neutrophils Relative %: 70 %
Platelets: 185 10*3/uL (ref 150–400)
RBC: 3.87 MIL/uL — ABNORMAL LOW (ref 4.22–5.81)
RDW: 11.9 % (ref 11.5–15.5)
WBC: 8.8 10*3/uL (ref 4.0–10.5)
nRBC: 0 % (ref 0.0–0.2)

## 2019-05-14 LAB — COMPREHENSIVE METABOLIC PANEL
ALT: 13 U/L (ref 0–44)
AST: 18 U/L (ref 15–41)
Albumin: 3.9 g/dL (ref 3.5–5.0)
Alkaline Phosphatase: 55 U/L (ref 38–126)
Anion gap: 10 (ref 5–15)
BUN: 25 mg/dL — ABNORMAL HIGH (ref 8–23)
CO2: 24 mmol/L (ref 22–32)
Calcium: 9.6 mg/dL (ref 8.9–10.3)
Chloride: 104 mmol/L (ref 98–111)
Creatinine, Ser: 1.3 mg/dL — ABNORMAL HIGH (ref 0.61–1.24)
GFR calc Af Amer: 58 mL/min — ABNORMAL LOW (ref >60)
GFR calc non Af Amer: 50 mL/min — ABNORMAL LOW (ref >60)
Glucose, Bld: 106 mg/dL — ABNORMAL HIGH (ref 70–99)
Potassium: 4.4 mmol/L (ref 3.5–5.1)
Sodium: 138 mmol/L (ref 135–145)
Total Bilirubin: 0.4 mg/dL (ref 0.3–1.2)
Total Protein: 6.4 g/dL — ABNORMAL LOW (ref 6.5–8.1)

## 2019-05-14 LAB — SARS CORONAVIRUS 2 (TAT 6-24 HRS): SARS Coronavirus 2: NEGATIVE

## 2019-05-14 LAB — GLUCOSE, CAPILLARY: Glucose-Capillary: 152 mg/dL — ABNORMAL HIGH (ref 70–99)

## 2019-05-14 NOTE — Progress Notes (Signed)
PCP - [place] Cardiologist - Krasowski  Chest x-ray - N/A EKG - 04-29-19 ECHO - 03-23-19 Cardiac Cath - 1999  DM - Type 2  Fasting Blood Sugar - 110-120  Aspirin Instructions: Follow your surgeon's instructions on when to stop ASA  Anesthesia review: yes, heart history At PAT appointment  BP: 180/79 (L), 184/73 (R), 170/72 (L) Pt states his BP has been running high due to stress [place] notified. Chart sent to anesthesia  Patient denies shortness of breath, fever, cough and chest pain at PAT appointment   Patient verbalized understanding of instructions that were given to them at the PAT appointment. Patient was also instructed that they will need to review over the PAT instructions again at home before surgery.

## 2019-05-15 LAB — HEMOGLOBIN A1C
Hgb A1c MFr Bld: 6.4 % — ABNORMAL HIGH (ref 4.8–5.6)
Mean Plasma Glucose: 137 mg/dL

## 2019-05-17 NOTE — Progress Notes (Signed)
Anesthesia Chart Review:  Case: [id number] Date/Time: 05/18/19 0715   Procedure: REPAIR RIGHT INGUINAL HERNIA W/ MESH (Right )   Anesthesia type: General   Pre-op diagnosis: RIGHT INGUINAL HERNIA   Location: [facility] OR ROOM 09 / [facility] OR   Surgeon: Erroll Luna, MD      DISCUSSION: Patient is an 85 year old male scheduled for the above procedure.   History includes never smoker, DM2, CAD (anterior MI, s/p LAD stent '99), HTN, GERD, tremors, skin cancer, BPH with urinary retention.  Patient had recent cardiology evaluation by Dr. Agustin Cree following 04/29/19 ED visit for chest pain. A preoperative stress test was done and was non-ischemic.    His BP was elevated ~ 170-180s/70's at PAT. This was noted in Dr. Wendy Poet 05/07/19 office note--he adds that patient's home BP reading demonstrate "much better" control, so he did not make any adjustments to patient's antihypertensive medication regimen.  His 05/14/19 presurgical COVID test was negative. Based on currently available information, I would anticipate that he can proceed as planned if no acute changes.    VS: BP (!) 184/73 Comment: notified Raquel Sarna B RN/taken in R arm  Pulse 75   Temp (!) 36.2 C   Resp 18   Ht 6' (1.829 m)   Wt 69.4 kg   SpO2 100%   BMI 20.76 kg/m  BP 180/79, 184/73, 170/72. (162/78 at 05/07/19 cardiology visit.)   PROVIDERS: Mellody Dance, DO is PCP  Jenne Campus, MD is cardiologist. (Previously saw Ezzard Standing, MD before his retirement.)    LABS: Labs reviewed: Acceptable for surgery. Cr 1.30, previously 1.36-1.78 since 08/2018. (all labs ordered are listed, but only abnormal results are displayed)  Labs Reviewed  GLUCOSE, CAPILLARY - Abnormal; Notable for the following components:      Result Value   Glucose-Capillary 152 (*)    All other components within normal limits  CBC WITH DIFFERENTIAL/PLATELET - Abnormal; Notable for the following components:   RBC 3.87 (*)    Hemoglobin 12.7 (*)    MCV  101.8 (*)    All other components within normal limits  COMPREHENSIVE METABOLIC PANEL - Abnormal; Notable for the following components:   Glucose, Bld 106 (*)    BUN 25 (*)    Creatinine, Ser 1.30 (*)    Total Protein 6.4 (*)    GFR calc non Af Amer 50 (*)    GFR calc Af Amer 58 (*)    All other components within normal limits  HEMOGLOBIN A1C - Abnormal; Notable for the following components:   Hgb A1c MFr Bld 6.4 (*)    All other components within normal limits    IMAGES: 1V CXR 04/29/19: IMPRESSION: No edema or consolidation.  Stable cardiac silhouette.   EKG: 04/29/19: Sinus rhythm Ventricular premature complex No previous tracing Confirmed by Lajean Saver [phone]) on 04/29/2019 11:14:36 AM   CV: Nuclear stress test 05/13/19:  Nuclear stress EF: 70%.  There was no ST segment deviation noted during stress.  No T wave inversion was noted during stress.  The study is normal.  This is a low risk study. Normal perfusion. LVEF 70% with normal wall motion. This is a low risk study.  Echo 03/17/17: Conclusion: 1. Mild concentric LVH with normal wall motion.  Doppler evidence of grade 1 (impaired) diastolic dysfunction.  Estimated EF 65%. 2.  Trace aortic regurgitation. AV peak vel 1.20 m/s, AV peak gradient 5.8 mmHg, AV mean gradient 3.0 mm Hg. 3.  Mild (grade 1) mitral regurgitation.  4.  Trace tricuspid regurgitation. 5.  The aortic root is mildly dilated. (AoD 3.8 cm)  Cardiac cath 10/12/07: IMPRESSION:  1. Widely patent stent to the LAD and moderate proximal disease      involving the LAD which is not thought to be severely obstructive.      No significant disease identified in other vessels.  2. Hyperdynamic left ventricular function.  3. Very mild gradient across left ventricular outflow tract and aortic      valve   Past Medical History:  Diagnosis Date  . Cancer [facility])    SKIN CANCER   . Cataract   . Diabetes mellitus without complication ([place])   . Elbow  effusion, left   . Frequency of urination   . GERD (gastroesophageal reflux disease)   . Heart attack ([place])   . Heart disease   . Hypertension   . Primary osteoarthritis of right shoulder 01/24/2015  . Prostate enlargement   . Tremors of nervous system   . Urinary retention due to benign prostatic hyperplasia 01/25/2015    Past Surgical History:  Procedure Laterality Date  . CARDIAC CATHETERIZATION     1999  . cardio stint    . HERNIA REPAIR     BILAT   . MOHS SURGERY     BILAT EARS   . PROSTATE BIOPSY    . TOTAL SHOULDER ARTHROPLASTY Right 01/24/2015   Procedure: RIGHT TOTAL SHOULDER ARTHROPLASTY;  Surgeon: Marchia Bond, MD;  Location: [facility];  Service: Orthopedics;  Laterality: Right;    MEDICATIONS: . aspirin 81 MG tablet  . atorvastatin (LIPITOR) 10 MG tablet  . Cobalamine Combinations (U-04 + FOLIC ACID PO)  . glimepiride (AMARYL) 1 MG tablet  . losartan (COZAAR) 25 MG tablet  . metFORMIN (GLUCOPHAGE) 500 MG tablet  . NONFORMULARY OR COMPOUNDED ITEM  . Polyethyl Glycol-Propyl Glycol (SYSTANE OP)  . polyethylene glycol (MIRALAX / GLYCOLAX) packet  . Polyethylene Glycol 400 (BLINK TEARS OP)  . Probiotic Product (PROBIOTIC DAILY PO)   No current facility-administered medications for this encounter.   In PAT, patient was instructed to clarify with surgeon perioperative ASA instructions.    Myra Gianotti, PA-C Surgical Short Stay/Anesthesiology [facility] Phone [phone] [facility] Phone [phone] 05/17/2019 12:24 PM

## 2019-05-17 NOTE — Anesthesia Preprocedure Evaluation (Addendum)
Anesthesia Evaluation  Patient identified by MRN, date of birth, ID band Patient awake    Reviewed: Allergy & Precautions, NPO status , Patient's Chart, lab work & pertinent test results  History of Anesthesia Complications Negative for: history of anesthetic complications  Airway Mallampati: II  TM Distance: >3 FB Neck ROM: Full    Dental  (+) Poor Dentition, Chipped, Dental Advisory Given, Missing   Pulmonary neg pulmonary ROS,  05/14/2019 SARS coronavirus NEG   breath sounds clear to auscultation       Cardiovascular hypertension, Pt. on medications (-) angina+ CAD, + Past MI and + Cardiac Stents   Rhythm:Regular Rate:Normal  05/13/2019 Stress: EF 70%, no ST-T changes, Normal wall mothion   Neuro/Psych  Headaches,    GI/Hepatic Neg liver ROS, GERD  Controlled,  Endo/Other  diabetes, Oral Hypoglycemic Agents  Renal/GU negative Renal ROS     Musculoskeletal   Abdominal   Peds  Hematology negative hematology ROS (+)   Anesthesia Other Findings   Reproductive/Obstetrics                           Anesthesia Physical Anesthesia Plan  ASA: III  Anesthesia Plan: General   Post-op Pain Management: GA combined w/ Regional for post-op pain   Induction: Intravenous  PONV Risk Score and Plan: 2 and Ondansetron and Dexamethasone  Airway Management Planned: Oral ETT  Additional Equipment:   Intra-op Plan:   Post-operative Plan: Extubation in OR  Informed Consent: I have reviewed the patients History and Physical, chart, labs and discussed the procedure including the risks, benefits and alternatives for the proposed anesthesia with the patient or authorized representative who has indicated his/her understanding and acceptance.     Dental advisory given  Plan Discussed with: CRNA and Surgeon  Anesthesia Plan Comments: (PAT note written 05/17/2019 by Myra Gianotti, PA-C. )       Anesthesia Quick Evaluation

## 2019-05-18 ENCOUNTER — Other Ambulatory Visit: Payer: Self-pay

## 2019-05-18 ENCOUNTER — Encounter (HOSPITAL_COMMUNITY): Payer: Self-pay

## 2019-05-18 ENCOUNTER — Ambulatory Visit (HOSPITAL_COMMUNITY): Payer: Medicare Other | Admitting: Anesthesiology

## 2019-05-18 ENCOUNTER — Encounter (HOSPITAL_COMMUNITY)
Admission: RE | Disposition: A | Discharge status: Home / Self Care | Payer: Self-pay | Source: Home / Self Care | Attending: Surgery

## 2019-05-18 ENCOUNTER — Observation Stay (HOSPITAL_COMMUNITY)
Admission: RE | Admit: 2019-05-18 | Discharge: 2019-05-19 | Disposition: A | Discharge status: Home / Self Care | Payer: Medicare Other | Attending: Surgery | Admitting: Surgery

## 2019-05-18 ENCOUNTER — Ambulatory Visit (HOSPITAL_COMMUNITY): Payer: Medicare Other | Admitting: Vascular Surgery

## 2019-05-18 DIAGNOSIS — I252 Old myocardial infarction: Secondary | ICD-10-CM | POA: Insufficient documentation

## 2019-05-18 DIAGNOSIS — Z85820 Personal history of malignant melanoma of skin: Secondary | ICD-10-CM | POA: Insufficient documentation

## 2019-05-18 DIAGNOSIS — E7800 Pure hypercholesterolemia, unspecified: Secondary | ICD-10-CM | POA: Insufficient documentation

## 2019-05-18 DIAGNOSIS — G8918 Other acute postprocedural pain: Secondary | ICD-10-CM | POA: Diagnosis not present

## 2019-05-18 DIAGNOSIS — Z79899 Other long term (current) drug therapy: Secondary | ICD-10-CM | POA: Diagnosis not present

## 2019-05-18 DIAGNOSIS — Z7984 Long term (current) use of oral hypoglycemic drugs: Secondary | ICD-10-CM | POA: Insufficient documentation

## 2019-05-18 DIAGNOSIS — Z955 Presence of coronary angioplasty implant and graft: Secondary | ICD-10-CM | POA: Diagnosis not present

## 2019-05-18 DIAGNOSIS — K4091 Unilateral inguinal hernia, without obstruction or gangrene, recurrent: Secondary | ICD-10-CM | POA: Diagnosis not present

## 2019-05-18 DIAGNOSIS — I2510 Atherosclerotic heart disease of native coronary artery without angina pectoris: Secondary | ICD-10-CM | POA: Diagnosis not present

## 2019-05-18 DIAGNOSIS — Z7982 Long term (current) use of aspirin: Secondary | ICD-10-CM | POA: Insufficient documentation

## 2019-05-18 DIAGNOSIS — K4090 Unilateral inguinal hernia, without obstruction or gangrene, not specified as recurrent: Secondary | ICD-10-CM | POA: Diagnosis not present

## 2019-05-18 DIAGNOSIS — E119 Type 2 diabetes mellitus without complications: Secondary | ICD-10-CM | POA: Diagnosis not present

## 2019-05-18 DIAGNOSIS — I10 Essential (primary) hypertension: Secondary | ICD-10-CM | POA: Insufficient documentation

## 2019-05-18 HISTORY — DX: Unilateral inguinal hernia, without obstruction or gangrene, recurrent: K40.91

## 2019-05-18 HISTORY — PX: INGUINAL HERNIA REPAIR: SUR1180

## 2019-05-18 HISTORY — PX: INGUINAL HERNIA REPAIR: SHX194

## 2019-05-18 LAB — CBC
HCT: 34.4 % — ABNORMAL LOW (ref 39.0–52.0)
Hemoglobin: 11.5 g/dL — ABNORMAL LOW (ref 13.0–17.0)
MCH: 33.2 pg (ref 26.0–34.0)
MCHC: 33.4 g/dL (ref 30.0–36.0)
MCV: 99.4 fL (ref 80.0–100.0)
Platelets: 163 10*3/uL (ref 150–400)
RBC: 3.46 MIL/uL — ABNORMAL LOW (ref 4.22–5.81)
RDW: 11.9 % (ref 11.5–15.5)
WBC: 8.8 10*3/uL (ref 4.0–10.5)
nRBC: 0 % (ref 0.0–0.2)

## 2019-05-18 LAB — GLUCOSE, CAPILLARY
Glucose-Capillary: 106 mg/dL — ABNORMAL HIGH (ref 70–99)
Glucose-Capillary: 149 mg/dL — ABNORMAL HIGH (ref 70–99)
Glucose-Capillary: 131 mg/dL — ABNORMAL HIGH (ref 70–99)
Glucose-Capillary: 223 mg/dL — ABNORMAL HIGH (ref 70–99)
Glucose-Capillary: 182 mg/dL — ABNORMAL HIGH (ref 70–99)

## 2019-05-18 LAB — CREATININE, SERUM
Creatinine, Ser: 1.36 mg/dL — ABNORMAL HIGH (ref 0.61–1.24)
GFR calc Af Amer: 55 mL/min — ABNORMAL LOW (ref >60)
GFR calc non Af Amer: 47 mL/min — ABNORMAL LOW (ref >60)

## 2019-05-18 SURGERY — REPAIR, HERNIA, INGUINAL, ADULT
Anesthesia: General | Site: Inguinal | Laterality: Right

## 2019-05-18 MED ORDER — TRAMADOL HCL 50 MG PO TABS
50.0000 mg | ORAL_TABLET | Freq: Four times a day (QID) | ORAL | Status: DC | PRN
  Administered 2019-05-18: 50 mg via ORAL
  Filled 2019-05-18: qty 1

## 2019-05-18 MED ORDER — HYDRALAZINE HCL 20 MG/ML IJ SOLN: 10.0000 mg | INTRAMUSCULAR | Status: DC | PRN

## 2019-05-18 MED ORDER — BUPIVACAINE-EPINEPHRINE (PF) 0.5% -1:200000 IJ SOLN
INTRAMUSCULAR | Status: DC | PRN
  Administered 2019-05-18: 30 mL

## 2019-05-18 MED ORDER — ACETAMINOPHEN 500 MG PO TABS
1000.0000 mg | ORAL_TABLET | ORAL | Status: AC
  Administered 2019-05-18: 07:00:00 1000 mg via ORAL

## 2019-05-18 MED ORDER — ACETAMINOPHEN 325 MG PO TABS: 650.0000 mg | ORAL_TABLET | Freq: Four times a day (QID) | ORAL | Status: DC | PRN

## 2019-05-18 MED ORDER — BUPIVACAINE-EPINEPHRINE 0.25% -1:200000 IJ SOLN
INTRAMUSCULAR | Status: DC | PRN
  Administered 2019-05-18: 30 mL

## 2019-05-18 MED ORDER — LACTATED RINGERS IV SOLN
INTRAVENOUS | Status: DC | PRN
  Administered 2019-05-18: 07:00:00 via INTRAVENOUS

## 2019-05-18 MED ORDER — FENTANYL CITRATE (PF) 250 MCG/5ML IJ SOLN
INTRAMUSCULAR | Status: DC | PRN
  Administered 2019-05-18: 50 ug via INTRAVENOUS

## 2019-05-18 MED ORDER — SODIUM CHLORIDE 0.45 % IV SOLN
INTRAVENOUS | Status: DC
  Administered 2019-05-18: 11:00:00 via INTRAVENOUS

## 2019-05-18 MED ORDER — GABAPENTIN 300 MG PO CAPS
300.0000 mg | ORAL_CAPSULE | ORAL | Status: AC
  Administered 2019-05-18: 07:00:00 300 mg via ORAL

## 2019-05-18 MED ORDER — OXYCODONE HCL 5 MG PO TABS
ORAL_TABLET | ORAL | Status: AC
  Filled 2019-05-18: qty 1

## 2019-05-18 MED ORDER — ONDANSETRON HCL 4 MG/2ML IJ SOLN: 4.0000 mg | Freq: Four times a day (QID) | INTRAMUSCULAR | Status: DC | PRN

## 2019-05-18 MED ORDER — METFORMIN HCL 500 MG PO TABS
500.0000 mg | ORAL_TABLET | Freq: Two times a day (BID) | ORAL | Status: DC
  Administered 2019-05-18 – 2019-05-19 (×2): 500 mg via ORAL
  Filled 2019-05-18 (×2): qty 1

## 2019-05-18 MED ORDER — CHLORHEXIDINE GLUCONATE CLOTH 2 % EX PADS: 6.0000 | MEDICATED_PAD | Freq: Once | CUTANEOUS | Status: DC

## 2019-05-18 MED ORDER — ONDANSETRON 4 MG PO TBDP: 4.0000 mg | ORAL_TABLET | Freq: Four times a day (QID) | ORAL | Status: DC | PRN

## 2019-05-18 MED ORDER — ONDANSETRON HCL 4 MG/2ML IJ SOLN
INTRAMUSCULAR | Status: DC | PRN
  Administered 2019-05-18: 4 mg via INTRAVENOUS

## 2019-05-18 MED ORDER — ACETAMINOPHEN 650 MG RE SUPP: 650.0000 mg | Freq: Four times a day (QID) | RECTAL | Status: DC | PRN

## 2019-05-18 MED ORDER — 0.9 % SODIUM CHLORIDE (POUR BTL) OPTIME
TOPICAL | Status: DC | PRN
  Administered 2019-05-18: 1000 mL

## 2019-05-18 MED ORDER — PROPOFOL 10 MG/ML IV BOLUS
INTRAVENOUS | Status: AC
  Filled 2019-05-18: qty 20

## 2019-05-18 MED ORDER — GABAPENTIN 300 MG PO CAPS
ORAL_CAPSULE | ORAL | Status: AC
  Administered 2019-05-18: 300 mg via ORAL
  Filled 2019-05-18: qty 1

## 2019-05-18 MED ORDER — ENOXAPARIN SODIUM 40 MG/0.4ML ~~LOC~~ SOLN
40.0000 mg | SUBCUTANEOUS | Status: DC
  Administered 2019-05-19: 40 mg via SUBCUTANEOUS
  Filled 2019-05-18: qty 0.4

## 2019-05-18 MED ORDER — GLIMEPIRIDE 1 MG PO TABS
1.0000 mg | ORAL_TABLET | Freq: Every day | ORAL | Status: DC
  Administered 2019-05-19: 1 mg via ORAL
  Filled 2019-05-18: qty 1

## 2019-05-18 MED ORDER — INSULIN ASPART 100 UNIT/ML ~~LOC~~ SOLN
0.0000 [IU] | Freq: Three times a day (TID) | SUBCUTANEOUS | Status: DC
  Administered 2019-05-18: 3 [IU] via SUBCUTANEOUS
  Administered 2019-05-18: 1 [IU] via SUBCUTANEOUS

## 2019-05-18 MED ORDER — PHENYLEPHRINE HCL (PRESSORS) 10 MG/ML IV SOLN
INTRAVENOUS | Status: DC | PRN
  Administered 2019-05-18: 80 ug via INTRAVENOUS

## 2019-05-18 MED ORDER — SODIUM CHLORIDE 0.9 % IV SOLN
INTRAVENOUS | Status: DC | PRN
  Administered 2019-05-18: 08:00:00 25 ug/min via INTRAVENOUS

## 2019-05-18 MED ORDER — BUPIVACAINE-EPINEPHRINE (PF) 0.25% -1:200000 IJ SOLN
INTRAMUSCULAR | Status: AC
  Filled 2019-05-18: qty 30

## 2019-05-18 MED ORDER — OXYCODONE HCL 5 MG PO TABS
5.0000 mg | ORAL_TABLET | ORAL | Status: DC | PRN
  Administered 2019-05-18 (×2): 5 mg via ORAL
  Filled 2019-05-18: qty 1

## 2019-05-18 MED ORDER — CEFAZOLIN SODIUM-DEXTROSE 2-4 GM/100ML-% IV SOLN
INTRAVENOUS | Status: AC
  Filled 2019-05-18: qty 100

## 2019-05-18 MED ORDER — ATORVASTATIN CALCIUM 10 MG PO TABS
10.0000 mg | ORAL_TABLET | Freq: Every day | ORAL | Status: DC
  Administered 2019-05-19: 10 mg via ORAL
  Filled 2019-05-18: qty 1

## 2019-05-18 MED ORDER — FENTANYL CITRATE (PF) 100 MCG/2ML IJ SOLN
INTRAMUSCULAR | Status: AC
  Filled 2019-05-18: qty 2

## 2019-05-18 MED ORDER — DEXAMETHASONE SODIUM PHOSPHATE 10 MG/ML IJ SOLN
INTRAMUSCULAR | Status: DC | PRN
  Administered 2019-05-18: 5 mg via INTRAVENOUS

## 2019-05-18 MED ORDER — FENTANYL CITRATE (PF) 250 MCG/5ML IJ SOLN
INTRAMUSCULAR | Status: AC
  Filled 2019-05-18: qty 5

## 2019-05-18 MED ORDER — ACETAMINOPHEN 500 MG PO TABS
ORAL_TABLET | ORAL | Status: AC
  Administered 2019-05-18: 1000 mg via ORAL
  Filled 2019-05-18: qty 2

## 2019-05-18 MED ORDER — LOSARTAN POTASSIUM 25 MG PO TABS
25.0000 mg | ORAL_TABLET | Freq: Every day | ORAL | Status: DC
  Administered 2019-05-18 – 2019-05-19 (×2): 25 mg via ORAL
  Filled 2019-05-18 (×2): qty 1

## 2019-05-18 MED ORDER — PROPOFOL 10 MG/ML IV BOLUS
INTRAVENOUS | Status: DC | PRN
  Administered 2019-05-18 (×2): 100 mg via INTRAVENOUS

## 2019-05-18 MED ORDER — CEFAZOLIN SODIUM-DEXTROSE 2-4 GM/100ML-% IV SOLN
2.0000 g | INTRAVENOUS | Status: AC
  Administered 2019-05-18: 08:00:00 2 g via INTRAVENOUS

## 2019-05-18 MED ORDER — LIDOCAINE 2% (20 MG/ML) 5 ML SYRINGE
INTRAMUSCULAR | Status: DC | PRN
  Administered 2019-05-18: 40 mg via INTRAVENOUS

## 2019-05-18 MED ORDER — FENTANYL CITRATE (PF) 100 MCG/2ML IJ SOLN
25.0000 ug | INTRAMUSCULAR | Status: DC | PRN
  Administered 2019-05-18 (×2): 25 ug via INTRAVENOUS
  Administered 2019-05-18: 50 ug via INTRAVENOUS

## 2019-05-18 SURGICAL SUPPLY — 39 items
BLADE CLIPPER SURG (BLADE) IMPLANT
CANISTER SUCT 3000ML PPV (MISCELLANEOUS) ×2 IMPLANT
CHLORAPREP W/TINT 26 (MISCELLANEOUS) ×3 IMPLANT
COVER SURGICAL LIGHT HANDLE (MISCELLANEOUS) ×3 IMPLANT
COVER WAND RF STERILE (DRAPES) ×3 IMPLANT
DERMABOND ADVANCED (GAUZE/BANDAGES/DRESSINGS) ×2
DERMABOND ADVANCED .7 DNX12 (GAUZE/BANDAGES/DRESSINGS) ×1 IMPLANT
DRAIN PENROSE 1/2X12 LTX STRL (WOUND CARE) IMPLANT
DRAPE LAPAROTOMY TRNSV 102X78 (DRAPES) ×3 IMPLANT
ELECT REM PT RETURN 9FT ADLT (ELECTROSURGICAL) ×3
ELECTRODE REM PT RTRN 9FT ADLT (ELECTROSURGICAL) ×1 IMPLANT
GLOVE BIO SURGEON STRL SZ8 (GLOVE) ×3 IMPLANT
GLOVE BIOGEL PI IND STRL 8 (GLOVE) ×1 IMPLANT
GLOVE BIOGEL PI INDICATOR 8 (GLOVE) ×2
GOWN STRL REUS W/ TWL LRG LVL3 (GOWN DISPOSABLE) ×1 IMPLANT
GOWN STRL REUS W/ TWL XL LVL3 (GOWN DISPOSABLE) ×1 IMPLANT
GOWN STRL REUS W/TWL LRG LVL3 (GOWN DISPOSABLE) ×2
GOWN STRL REUS W/TWL XL LVL3 (GOWN DISPOSABLE) ×2
KIT BASIN OR (CUSTOM PROCEDURE TRAY) ×3 IMPLANT
KIT TURNOVER KIT B (KITS) ×3 IMPLANT
MESH PARIETEX PROGRIP RIGHT (Mesh General) ×2 IMPLANT
NDL HYPO 25GX1X1/2 BEV (NEEDLE) ×1 IMPLANT
NEEDLE HYPO 25GX1X1/2 BEV (NEEDLE) ×3 IMPLANT
NS IRRIG 1000ML POUR BTL (IV SOLUTION) ×3 IMPLANT
PACK GENERAL/GYN (CUSTOM PROCEDURE TRAY) ×3 IMPLANT
PAD ARMBOARD 7.5X6 YLW CONV (MISCELLANEOUS) ×3 IMPLANT
PENCIL SMOKE EVACUATOR (MISCELLANEOUS) ×3 IMPLANT
SUT MNCRL AB 4-0 PS2 18 (SUTURE) ×3 IMPLANT
SUT NOVA NAB DX-16 0-1 5-0 T12 (SUTURE) ×3 IMPLANT
SUT SILK 2 0 SH (SUTURE) IMPLANT
SUT VIC AB 0 CT1 27 (SUTURE)
SUT VIC AB 0 CT1 27XBRD ANBCTR (SUTURE) IMPLANT
SUT VIC AB 2-0 SH 27 (SUTURE) ×2
SUT VIC AB 2-0 SH 27X BRD (SUTURE) ×1 IMPLANT
SUT VIC AB 3-0 SH 18 (SUTURE) ×5 IMPLANT
SUT VICRYL AB 3 0 TIES (SUTURE) ×3 IMPLANT
SYR CONTROL 10ML LL (SYRINGE) ×3 IMPLANT
TOWEL GREEN STERILE FF (TOWEL DISPOSABLE) ×3 IMPLANT
TOWEL OR 17X26 10 PK STRL BLUE (TOWEL DISPOSABLE) ×3 IMPLANT

## 2019-05-18 NOTE — H&P (Signed)
George Barnett  Location: [facility] Patient #: [id number] DOB: 11-16-1933 Married / Language: English / Race: White Male  History of Present Illness Patient words: Patient is self-referred due to bulge in his right groin. He has a history of bilateral inguinal hernia repair. The first was when he was 85 years old. He was not sure which side this was all below. His most recent repair was within the last 5 years. He developed a mildly painful bulge in his right groin. This pops in and pops out without difficulty. He denies any nausea or vomiting. He does have benign prostatic hypertrophy and some voiding issues which are mild he  states.  The patient is a 85 year old male.   Past Surgical History  Cataract Surgery Bilateral. Open Inguinal Hernia Surgery Bilateral. Shoulder Surgery Bilateral. TURP  Diagnostic Studies History  Colonoscopy 5-10 years ago  Allergies  No Known Drug Allergies [12/28/2018]: Allergies Reconciled  Medication History  Aspirin (81MG  Tablet, Oral) Active. Atorvastatin Calcium (10MG  Tablet, Oral) Active. Losartan Potassium (25MG  Tablet, Oral) Active. metFORMIN HCl (500MG  Tablet, Oral) Active. Medications Reconciled  Social History  Alcohol use Occasional alcohol use. Caffeine use Coffee, Tea. No drug use Tobacco use Never smoker.  Family History  Arthritis Mother. Breast Cancer Mother. Heart Disease Father, Mother. Heart disease in male family member before age 6 Hypertension Mother. Migraine Headache Daughter, Son.  Other Problems  Arthritis Chest pain Diabetes Mellitus Enlarged Prostate High blood pressure Hypercholesterolemia Inguinal Hernia Melanoma Myocardial infarction     Review of Systems  General Not Present- Appetite Loss, Chills, Fatigue, Fever, Night Sweats, Weight Gain and Weight Loss. Skin Present- Non-Healing Wounds. Not Present- Change in Wart/Mole,  Dryness, Hives, Jaundice, New Lesions, Rash and Ulcer. HEENT Present- Hearing Loss. Not Present- Earache, Hoarseness, Nose Bleed, Oral Ulcers, Ringing in the Ears, Seasonal Allergies, Sinus Pain, Sore Throat, Visual Disturbances, Wears glasses/contact lenses and Yellow Eyes. Respiratory Not Present- Bloody sputum, Chronic Cough, Difficulty Breathing, Snoring and Wheezing. Breast Not Present- Breast Mass, Breast Pain, Nipple Discharge and Skin Changes. Cardiovascular Present- Swelling of Extremities. Not Present- Chest Pain, Difficulty Breathing Lying Down, Leg Cramps, Palpitations, Rapid Heart Rate and Shortness of Breath. Gastrointestinal Present- Constipation. Not Present- Abdominal Pain, Bloating, Bloody Stool, Change in Bowel Habits, Chronic diarrhea, Difficulty Swallowing, Excessive gas, Gets full quickly at meals, Hemorrhoids, Indigestion, Nausea, Rectal Pain and Vomiting. Male Genitourinary Present- Frequency and Nocturia. Not Present- Blood in Urine, Change in Urinary Stream, Impotence, Painful Urination, Urgency and Urine Leakage. Musculoskeletal Not Present- Back Pain, Joint Pain, Joint Stiffness, Muscle Pain, Muscle Weakness and Swelling of Extremities. Neurological Present- Tremor. Not Present- Decreased Memory, Fainting, Headaches, Numbness, Seizures, Tingling, Trouble walking and Weakness. Psychiatric Not Present- Anxiety, Bipolar, Change in Sleep Pattern, Depression, Fearful and Frequent crying. Endocrine Not Present- Cold Intolerance, Excessive Hunger, Hair Changes, Heat Intolerance, Hot flashes and New Diabetes. Hematology Present- Easy Bruising and Persistent Infections. Not Present- Blood Thinners, Excessive bleeding, Gland problems and HIV.  Vitals  12/28/2018 9:52 AM Weight: 159 lb Height: 70in Body Surface Area: 1.89 m Body Mass Index: 22.81 kg/m  Temp.: 97.29F  Pulse: 91 (Regular)  BP: 162/80 (Sitting, Left Arm, Standard)      Physical Exam  Head  and Neck Head-normocephalic, atraumatic with no lesions or palpable masses. Trachea-midline. Thyroid Gland Characteristics - normal size and consistency.  Chest and Lung Exam Chest and lung exam reveals -quiet, even and easy respiratory effort with no use of accessory muscles and on auscultation,  normal breath sounds, no adventitious sounds and normal vocal resonance. Inspection Chest Wall - Normal. Back - normal.  Cardiovascular Note: Regular rhythm without rub murmur gallop  Abdomen Note: Scars noted in both groins. Small reducible right inguinal hernia noted. No evidence of left inguinal hernia.  Neurologic Note: Baseline tremor at rest of both hands    Assessment & Plan  RECURRENT RIGHT INGUINAL HERNIA (K40.91)  Current Plans You are being scheduled for surgery- Our schedulers will call you.  You should hear from our office's scheduling department within 5 working days about the location, date, and time of surgery. We try to make accommodations for patient's preferences in scheduling surgery, but sometimes the OR schedule or the surgeon's schedule prevents [place] from making those accommodations.  If you have not heard from our office [phone]) in 5 working days, call the office and ask for your surgeon's nurse.  If you have other questions about your diagnosis, plan, or surgery, call the office and ask for your surgeon's nurse.  Pt Education - Pamphlet Given - Hernia Surgery: discussed with patient and provided information. The anatomy & physiology of the abdominal wall and pelvic floor was discussed. The pathophysiology of hernias in the inguinal and pelvic region was discussed. Natural history risks such as progressive enlargement, pain, incarceration, and strangulation was discussed. Contributors to complications such as smoking, obesity, diabetes, prior surgery, etc were discussed.  I feel the risks of no intervention will lead to serious  problems that outweigh the operative risks; therefore, I recommended surgery to reduce and repair the hernia. I explained an open approach. I noted usual use of mesh to patch and/or buttress hernia repair  Risks such as bleeding, infection, abscess, need for further treatment, heart attack, death, and other risks were discussed. I noted a good likelihood this will help address the problem. Goals of post-operative recovery were discussed as well. Possibility that this will not correct all symptoms was explained. I stressed the importance of low-impact activity, aggressive pain control, avoiding constipation, & not pushing through pain to minimize risk of post-operative chronic pain or injury. Possibility of reherniation was discussed. We will work to minimize complications.  An educational handout further explaining the pathology & treatment options was given as well. Questions were answered. The patient expresses understanding & wishes to proceed with surgery.  Pt Education - [facility] Mesh education: discussed with patient and provided information.    KEEP OVERNIGHT DUE TO MEDICAL PROBLEMS

## 2019-05-18 NOTE — Anesthesia Procedure Notes (Signed)
Procedure Name: LMA Insertion Date/Time: 05/18/2019 7:47 AM Performed by: Clearnce Sorrel, CRNA Pre-anesthesia Checklist: Patient identified, Emergency Drugs available, Suction available, Patient being monitored and Timeout performed Patient Re-evaluated:Patient Re-evaluated prior to induction Oxygen Delivery Method: Circle system utilized Preoxygenation: Pre-oxygenation with 100% oxygen Induction Type: IV induction LMA: LMA inserted LMA Size: 4.0 Number of attempts: 1 Airway Equipment and Method: Stylet Placement Confirmation: positive ETCO2 and breath sounds checked- equal and bilateral Tube secured with: Tape Dental Injury: Teeth and Oropharynx as per pre-operative assessment

## 2019-05-18 NOTE — Anesthesia Postprocedure Evaluation (Signed)
Anesthesia Post Note  Patient: George Barnett  Procedure(s) Performed: REPAIR RIGHT INGUINAL HERNIA WITH MESH (Right Inguinal)     Patient location during evaluation: PACU Anesthesia Type: General and Regional Level of consciousness: awake and alert, patient cooperative and oriented Pain management: pain level controlled Vital Signs Assessment: post-procedure vital signs reviewed and stable Respiratory status: spontaneous breathing, nonlabored ventilation and respiratory function stable Cardiovascular status: blood pressure returned to baseline and stable Postop Assessment: no apparent nausea or vomiting Anesthetic complications: no    Last Vitals:  Vitals:   05/18/19 0950 05/18/19 1005  BP: (!) 167/81 (!) 170/81  Pulse: 70 70  Resp: 14 19  Temp:  (!) 36.3 C  SpO2: 100% 99%    Last Pain:  Vitals:   05/18/19 1005  TempSrc:   PainSc: 3                  [name],E. Mac Dowdell

## 2019-05-18 NOTE — Op Note (Signed)
Right inguinal Hernia, Open, Procedure Note recurrent with Progrip mesh   Indications: The patient presented with a history of a right, reducible recurrent inguinal  hernia.  Pt rescheduled due to [place] and understood risk of surgery in this setting and increased complication rates. The risk of hernia repair include bleeding,  Infection,   Recurrence of the hernia,  Mesh use, chronic pain,  Organ injury,  Bowel injury,  Bladder injury,   nerve injury with numbness around the incision,  Death,  and worsening of preexisting  medical problems.  The alternatives to surgery have been discussed as well..  Long term expectations of both operative and non operative treatments have been discussed.   The patient agrees to proceed.  Pre-operative Diagnosis: right reducible recurrent inguinal   Post-operative Diagnosis: same  Surgeon: Turner Daniels MD   Assistants: NONE   Anesthesia: General endotracheal anesthesia, Local anesthesia 0.25.% bupivacaine, with epinephrine and TAP per anesthesia   ASA Class: 3  Procedure Details  The patient was seen again in the Holding Room. The risks, benefits, complications, treatment options, and expected outcomes were discussed with the patient. The possibilities of reaction to medication, pulmonary aspiration, perforation of viscus, bleeding, recurrent infection, the need for additional procedures, and development of a complication requiring transfusion or further operation were discussed with the patient and/or family. There was concurrence with the proposed plan, and informed consent was obtained. The site of surgery was properly noted/marked. The patient was taken to the Operating Room, identified as George Barnett, and the procedure verified as hernia repair. A Time Out was held and the above information confirmed.  The patient was placed in the supine position and underwent induction of anesthesia, the lower abdomen and groin was prepped and draped in the standard  fashion, and 0.25% Marcaine with epinephrine was used to anesthetize the skin over the mid-portion of the inguinal canal. A transverse incision was made through a previous scar from repair in the 1950's.  Dissection was carried through the soft tissue to expose the inguinal canal and inguinal ligament along its lower edge. Scar tissue was minimal and the cord was partially exterioralized. The external oblique fascia was split along the course of its fibers, exposing the inguinal canal. The cord and nerve were looped using a Penrose drain and reflected out of the field. The defect was  A direct defect and no mesh was present. It was exposed and a piece of Progripp  mesh was used  and placed over the defect. Interupted 2-0 novafil suture was then used  to repair the defect, with the suture being sewn from the pubic tubercle inferiorly and superiorly along the canal to a level just beyond the internal ring. The mesh was split to allow passage of the cord and nerve into the canal without entrapment. The contents were then returned to canal and the external oblique fashion was then closed in a continuous fashion using 3-0 Vicryl suture taking care not to cause entrapment. Scarpa's layer closed with 3 0 vicryl and 4 0 monocryl used to close the skin.  Dermabond used for dressing.  Instrument, sponge, and needle counts were correct prior to closure and at the conclusion of the case.  Findings: Hernia as above  Estimated Blood Loss: Minimal         Drains: None         Total IV Fluids: per record         Specimens: none  Complications: None; patient tolerated the procedure well.         Disposition: PACU - hemodynamically stable.         Condition: stable

## 2019-05-18 NOTE — Transfer of Care (Signed)
Immediate Anesthesia Transfer of Care Note  Patient: George Barnett  Procedure(s) Performed: REPAIR RIGHT INGUINAL HERNIA WITH MESH (Right Inguinal)  Patient Location: PACU  Anesthesia Type:General and Regional  Level of Consciousness: awake, alert  and oriented  Airway & Oxygen Therapy: Patient Spontanous Breathing and Patient connected to nasal cannula oxygen  Post-op Assessment: Report given to RN and Post -op Vital signs reviewed and stable  Post vital signs: Reviewed and stable  Last Vitals:  Vitals Value Taken Time  BP    Temp    Pulse 67 05/18/19 0903  Resp 14 05/18/19 0903  SpO2 100 % 05/18/19 0903  Vitals shown include unvalidated device data.  Last Pain:  Vitals:   05/18/19 0652  TempSrc:   PainSc: 0-No pain      Patients Stated Pain Goal: 0 (09/13/24 3664)  Complications: No apparent anesthesia complications

## 2019-05-18 NOTE — Plan of Care (Signed)
  Problem: Clinical Measurements: Goal: Postoperative complications will be avoided or minimized Outcome: Progressing   

## 2019-05-18 NOTE — Interval H&P Note (Signed)
History and Physical Interval Note:  05/18/2019 7:21 AM  George Barnett  has presented today for surgery, with the diagnosis of RIGHT INGUINAL HERNIA.  The various methods of treatment have been discussed with the patient and family. After consideration of risks, benefits and other options for treatment, the patient has consented to  Procedure(s): REPAIR RIGHT INGUINAL HERNIA WITH MESH (Right) as a surgical intervention.  The patient's history has been reviewed, patient examined, no change in status, stable for surgery.  I have reviewed the patient's chart and labs.  Questions were answered to the patient's satisfaction.     [place]

## 2019-05-18 NOTE — Anesthesia Procedure Notes (Signed)
Anesthesia Regional Block: TAP block   Pre-Anesthetic Checklist: ,, timeout performed, Correct Patient, Correct Site, Correct Laterality, Correct Procedure, Correct Position, site marked, Risks and benefits discussed,  Surgical consent,  Pre-op evaluation,  At surgeon's request and post-op pain management  Laterality: Right  Prep: chloraprep       Needles:   Needle Type: Echogenic Needle     Needle Length: 9cm  Needle Gauge: 21     Additional Needles:   Procedures:,,,, ultrasound used (permanent image in chart),,,,  Narrative:  Start time: 05/18/2019 7:21 AM End time: 05/18/2019 7:30 AM Injection made incrementally with aspirations every 5 mL. Anesthesiologist: Annye Asa, MD  Additional Notes: Pt identified in Holding room.  Monitors applied. Working IV access confirmed. Sterile prep, drape R flank.  #21ga ECHOgenic needle into TAP with US guidance.  30cc 0.5% Bupivacaine with 1:200k epi injected incrementally after negative test dose.  Patient asymptomatic, VSS, no heme aspirated, tolerated well.  Jenita Seashore, MD

## 2019-05-19 ENCOUNTER — Encounter (HOSPITAL_COMMUNITY): Payer: Self-pay | Admitting: Surgery

## 2019-05-19 DIAGNOSIS — E119 Type 2 diabetes mellitus without complications: Secondary | ICD-10-CM | POA: Diagnosis not present

## 2019-05-19 DIAGNOSIS — K4091 Unilateral inguinal hernia, without obstruction or gangrene, recurrent: Secondary | ICD-10-CM | POA: Diagnosis not present

## 2019-05-19 DIAGNOSIS — I10 Essential (primary) hypertension: Secondary | ICD-10-CM | POA: Diagnosis not present

## 2019-05-19 DIAGNOSIS — I2510 Atherosclerotic heart disease of native coronary artery without angina pectoris: Secondary | ICD-10-CM | POA: Diagnosis not present

## 2019-05-19 DIAGNOSIS — I252 Old myocardial infarction: Secondary | ICD-10-CM | POA: Diagnosis not present

## 2019-05-19 DIAGNOSIS — E7800 Pure hypercholesterolemia, unspecified: Secondary | ICD-10-CM | POA: Diagnosis not present

## 2019-05-19 LAB — GLUCOSE, CAPILLARY: Glucose-Capillary: 100 mg/dL — ABNORMAL HIGH (ref 70–99)

## 2019-05-19 MED ORDER — TRAMADOL HCL 50 MG PO TABS: 50.0000 mg | ORAL_TABLET | Freq: Four times a day (QID) | ORAL | 0 refills | Status: DC | PRN

## 2019-05-19 NOTE — Discharge Summary (Signed)
Physician Discharge Summary  Patient ID: George Barnett MRN: [id number] DOB/AGE: 1933-02-06 85 y.o.  Admit date: 05/18/2019 Discharge date: 05/19/2019  Admission Diagnoses:Active Problems:   Recurrent right inguinal hernia  Discharge Diagnoses:  Active Problems:   Recurrent right inguinal hernia   Discharged Condition: good  Hospital Course: Pt did well post op.  He tolerated his diet and was able to ambulate without difficulty.  He had good pain control.   Consults: None    Treatments: surgery: RIH repair with mesh   Discharge Exam: Blood pressure (!) 170/72, pulse 63, temperature 97.8 F (36.6 C), temperature source Oral, resp. rate 16, height 6' (1.829 m), weight 69.4 kg, SpO2 100 %. General appearance: alert and cooperative Resp: clear to auscultation bilaterally Cardio: regular rate and rhythm, S1, S2 normal, no murmur, click, rub or gallop Incision/Wound:right groin incision CDI moderate swelling   Disposition: Discharge disposition: 01-Home or Self Care       Discharge Instructions    Diet - low sodium heart healthy   Complete by: As directed    Increase activity slowly   Complete by: As directed      Allergies as of 05/19/2019      Reactions   Exenatide Nausea And Vomiting   Meperidine And Related Other (See Comments)   Red streak, that happened once while having an IV   Nadolol Other (See Comments)   Numbness in fingers   Sitagliptin Phosphate [sitagliptin] Other (See Comments)   Weight loss      Medication List    TAKE these medications   aspirin 81 MG tablet Take 81 mg by mouth every evening.   atorvastatin 10 MG tablet Commonly known as: LIPITOR Take 1 tablet (10 mg total) by mouth daily. What changed:   how much to take  when to take this  additional instructions   E-75 + FOLIC ACID PO Take 1 capsule by mouth every evening.   BLINK TEARS OP Place 1 drop into both eyes 4 (four) times daily as needed (dry eyes).   glimepiride 1  MG tablet Commonly known as: Amaryl Take 1 tablet (1 mg total) by mouth daily with breakfast.   losartan 25 MG tablet Commonly known as: COZAAR TAKE 1 TABLET DAILY   metFORMIN 500 MG tablet Commonly known as: GLUCOPHAGE Take 1 tablet (500 mg total) by mouth 2 (two) times daily with a meal.   NONFORMULARY OR COMPOUNDED ITEM Apply 1 application topically 2 (two) times a day. SM 5-Fluorouracil/Calcipotriene 5%/0.005 For 14 days Started on 6.16.20   polyethylene glycol 17 g packet Commonly known as: MIRALAX / GLYCOLAX Take 17 g by mouth daily as needed for mild constipation.   PROBIOTIC DAILY PO Take 2 capsules by mouth daily with lunch.   SYSTANE OP Place 1 drop into both eyes daily.   traMADol 50 MG tablet Commonly known as: ULTRAM Take 1 tablet (50 mg total) by mouth every 6 (six) hours as needed (mild pain).        Signed: Joyice Faster Yesena Reaves 05/19/2019, 8:00 AM

## 2019-05-19 NOTE — Discharge Instructions (Signed)
[facility] _______[facility] ° °UMBILICAL OR INGUINAL HERNIA REPAIR: POST OP INSTRUCTIONS ° °Always review your discharge instruction sheet given to you by the facility where your surgery was performed. °IF YOU HAVE DISABILITY OR FAMILY LEAVE FORMS, YOU MUST BRING THEM TO THE OFFICE FOR PROCESSING.   °DO NOT GIVE THEM TO YOUR DOCTOR. ° °1. A  prescription for pain medication may be given to you upon discharge.  Take your pain medication as prescribed, if needed.  If narcotic pain medicine is not needed, then you may take acetaminophen (Tylenol) or ibuprofen (Advil) as needed. °2. Take your usually prescribed medications unless otherwise directed. °If you need a refill on your pain medication, please contact your pharmacy.  They will contact our office to request authorization. Prescriptions will not be filled after 5 pm or on week-ends. °3. You should follow a light diet the first 24 hours after arrival home, such as soup and crackers, etc.  Be sure to include lots of fluids daily.  Resume your normal diet the day after surgery. °4.Most patients will experience some swelling and bruising around the umbilicus or in the groin and scrotum.  Ice packs and reclining will help.  Swelling and bruising can take several days to resolve.  °6. It is common to experience some constipation if taking pain medication after surgery.  Increasing fluid intake and taking a stool softener (such as Colace) will usually help or prevent this problem from occurring.  A mild laxative (Milk of Magnesia or Miralax) should be taken according to package directions if there are no bowel movements after 48 hours. °7. Unless discharge instructions indicate otherwise, you may remove your bandages 24-48 hours after surgery, and you may shower at that time.  You may have steri-strips (small skin tapes) in place directly over the incision.  These strips should be left on the skin for 7-10 days.  If your surgeon used skin glue on the  incision, you may shower in 24 hours.  The glue will flake off over the next 2-3 weeks.  Any sutures or staples will be removed at the office during your follow-up visit. °8. ACTIVITIES:  You may resume regular (light) daily activities beginning the next day--such as daily self-care, walking, climbing stairs--gradually increasing activities as tolerated.  You may have sexual intercourse when it is comfortable.  Refrain from any heavy lifting or straining until approved by your doctor. ° °a.You may drive when you are no longer taking prescription pain medication, you can comfortably wear a seatbelt, and you can safely maneuver your car and apply brakes. °b.RETURN TO WORK:   °_____________________________________________ ° °9.You should see your doctor in the office for a follow-up appointment approximately 2-3 weeks after your surgery.  Make sure that you call for this appointment within a day or two after you arrive home to insure a convenient appointment time. °10.OTHER INSTRUCTIONS: _________________________ °   _____________________________________ ° °WHEN TO CALL YOUR DOCTOR: °1. Fever over 101.0 °2. Inability to urinate °3. Nausea and/or vomiting °4. Extreme swelling or bruising °5. Continued bleeding from incision. °6. Increased pain, redness, or drainage from the incision ° °The clinic staff is available to answer your questions during regular business hours.  Please don’t hesitate to call and ask to speak to one of the nurses for clinical concerns.  If you have a medical emergency, go to the nearest emergency room or call 911.  A surgeon from [facility] is always on call at the hospital ° ° °  [address], [address], [place], [place]  [address] ? ° [address] [address], [place], [place]   [address] °[phone] ? [phone] ? FAX [phone] °Web site: [email or web address] °

## 2019-05-19 NOTE — Progress Notes (Signed)
Patient discharged to home with instructions given to him and his daughter.

## 2019-06-02 ENCOUNTER — Ambulatory Visit (INDEPENDENT_AMBULATORY_CARE_PROVIDER_SITE_OTHER): Payer: Medicare Other | Admitting: Family Medicine

## 2019-06-02 ENCOUNTER — Other Ambulatory Visit: Payer: Self-pay

## 2019-06-02 ENCOUNTER — Encounter: Payer: Self-pay | Admitting: Family Medicine

## 2019-06-02 VITALS — BP 122/78 | HR 62 | Ht 72.0 in | Wt 153.0 lb

## 2019-06-02 DIAGNOSIS — E1122 Type 2 diabetes mellitus with diabetic chronic kidney disease: Secondary | ICD-10-CM | POA: Diagnosis not present

## 2019-06-02 DIAGNOSIS — E1129 Type 2 diabetes mellitus with other diabetic kidney complication: Secondary | ICD-10-CM

## 2019-06-02 DIAGNOSIS — N183 Chronic kidney disease, stage 3 unspecified: Secondary | ICD-10-CM

## 2019-06-02 DIAGNOSIS — L739 Follicular disorder, unspecified: Secondary | ICD-10-CM

## 2019-06-02 DIAGNOSIS — E1169 Type 2 diabetes mellitus with other specified complication: Secondary | ICD-10-CM | POA: Diagnosis not present

## 2019-06-02 DIAGNOSIS — R809 Proteinuria, unspecified: Secondary | ICD-10-CM

## 2019-06-02 DIAGNOSIS — I10 Essential (primary) hypertension: Secondary | ICD-10-CM

## 2019-06-02 DIAGNOSIS — I2510 Atherosclerotic heart disease of native coronary artery without angina pectoris: Secondary | ICD-10-CM

## 2019-06-02 DIAGNOSIS — E1159 Type 2 diabetes mellitus with other circulatory complications: Secondary | ICD-10-CM

## 2019-06-02 DIAGNOSIS — E782 Mixed hyperlipidemia: Secondary | ICD-10-CM

## 2019-06-02 DIAGNOSIS — R609 Edema, unspecified: Secondary | ICD-10-CM | POA: Diagnosis not present

## 2019-06-02 NOTE — Progress Notes (Signed)
Telehealth office visit note for George Barnett, D.O- at Primary Care at [facility]   I connected with current patient today and verified that I am speaking with the correct person using two identifiers.   . Location of the patient: Home . Location of the provider: Office Only the patient (+/- their family members at pt's discretion) and myself were participating in the encounter    - This visit type was conducted due to national recommendations for restrictions regarding the COVID-19 Pandemic (e.g. social distancing) in an effort to limit this patient's exposure and mitigate transmission in our community.  This format is felt to be most appropriate for this patient at this time.   - The patient did not have access to video technology or had technical difficulties with video requiring transitioning to audio format only. - No physical exam could be performed with this format, beyond that communicated to [place] by the patient/ family members as noted.   - Additionally my office staff/ schedulers discussed with the patient that there may be a monetary charge related to this service, depending on their medical insurance.   The patient expressed understanding, and agreed to proceed.       History of Present Illness:   --  Had f/up with sx yesterday.  recurrent R inguinal sx repair recently had- Dr Brantley Stage.  - Diet back to normal and walking around.     Pt states he has a scalp problem that has been bad lately ---> recently seen by Derm - Dr Ronalee Red of [facility].   New treatments/ new meds and pt has been very stressed    1. HTN HPI: -  Recently had stress test thru Cards in prep for his hernia repair.  Pt has white coat syndrome and it is always higher in office.  BP has been good control at home.  120's/ 70's on ave.     -  His blood pressure has been controlled at home.  Pt is checking it at home.   - Patient reports good compliance with blood pressure medications.  - 123/77, 121/69  - Denies medication S-E   - Smoking Status noted   - He denies new onset of: chest pain, exercise intolerance, shortness of breath, dizziness, visual changes, headache, lower extremity swelling or claudication.   Today their BP is BP: 122/78   Last 3 blood pressure readings in our office are as follows: BP Readings from Last 3 Encounters:  06/02/19 122/78  05/19/19 (!) 170/72  05/14/19 (!) 184/73   Filed Weights   06/02/19 1337  Weight: 153 lb (69.4 kg)     2   DM HPI:  -  He has been working on diet and exercise for diabetes  Medication compliance - good- metformin twice daily  Home fasting glucose readings: 108, 118 112 ;  2 hr PP: not checking   Denies polyuria/polydipsia.  Denies hypo/ hyperglycemia symptoms  Last diabetic eye exam was  Lab Results  Component Value Date   HMDIABEYEEXA No Retinopathy 04/16/2018    Last A1C in the office was:  Lab Results  Component Value Date   HGBA1C 6.4 (H) 05/14/2019   HGBA1C 7.9 (H) 02/01/2019   HGBA1C 6.1 (A) 11/27/2018    Lab Results  Component Value Date   MICROALBUR 10 07/15/2017   LDLCALC 46 03/17/2018   CREATININE 1.36 (H) 05/18/2019    Wt Readings from Last 3 Encounters:  06/02/19 153 lb (69.4 kg)  05/18/19  153 lb 1.6 oz (69.4 kg)  05/14/19 153 lb 1.6 oz (69.4 kg)    BP Readings from Last 3 Encounters:  06/02/19 122/78  05/19/19 (!) 170/72  05/14/19 (!) 184/73      CHOL HPI:   -  He  is currently managed with:  See med list from today  Patient reports very little compliance with low chol/ saturated and trans fat diet.  No exercise  No other s-e  Last lipid panel as follows:  Lab Results  Component Value Date   CHOL 105 03/17/2018   HDL 49 03/17/2018   LDLCALC 46 03/17/2018   TRIG 52 03/17/2018   CHOLHDL 2.1 03/17/2018    Hepatic Function Latest Ref Rng & Units 05/14/2019 04/29/2019 02/01/2019  Total Protein 6.5 - 8.1 g/dL 6.4(L) 6.9 6.2  Albumin 3.5 - 5.0 g/dL 3.9 4.3 4.3  AST 15 - 41  U/L '18 21 21  ' ALT 0 - 44 U/L '13 13 11  ' Alk Phosphatase 38 - 126 U/L 55 55 60  Total Bilirubin 0.3 - 1.2 mg/dL 0.4 0.8 0.5     Impression and Recommendations:    1. Type 2 diabetes mellitus with stage 3 chronic kidney disease, without long-term current use of insulin ([place])   2. Hypertension associated with diabetes ([place])   3. Mixed diabetic hyperlipidemia associated with type 2 diabetes mellitus ([place])   4. Microalbuminuria due to type 2 diabetes mellitus ([place])   5. Coronary artery disease involving native coronary artery of native heart without angina pectoris   6. Edema, peripheral   7. Folliculitis- scalp; txed by Derm- Dr Jarome Matin- referred pt to [facility]     -Patient's blood sugars under good control.  A1c great.  We will recheck in 4 to 6 months.  Continue current medications which patient is tolerating well  -Blood pressure-patient with known whitecoat syndrome, thus blood pressures always higher in the office than at home.  Has been very good control in the 120s over 70s on average at home.  Patient without symptoms, continue current meds  -Last lipid panel was done in April 2019 but was under great control.  After discussion, patient does not wish to come into the office just for labs to check this anytime in the near future due to [place].  He is tolerating his statin well and will continue current dose.  Recent CMP was within normal limits  -We will continue to monitor urine microalbumin yearly.  Patient remains on an ARB  -Recent stress test- was normal.  I reviewed test results today with patient that was done end of June  -Reminded patient to elevate feet above the level of the heart if he is laying around.  Also encouraged to get up and walk frequently as he just does not want to late around much and increased risk for blood clots etc.  Use TED hose as needed  -Follow-up at [facility] dermatology for your scalp condition and medications treating that  - As part of my  medical decision making, I reviewed the following data within the [place] History obtained from pt /family, CMA notes reviewed and incorporated if applicable, Labs reviewed, Radiograph/ tests reviewed if applicable and OV notes from prior OV's with me, as well as other specialists she/he has seen since seeing me last, were all reviewed and used in my medical decision making process today.   - Additionally, discussion had with patient regarding txmnt plan, and their biases/concerns about that plan  were used in my medical decision making today.   - The patient agreed with the plan and demonstrated an understanding of the instructions.   No barriers to understanding were identified.   - Red flag symptoms and signs discussed in detail.  Patient expressed understanding regarding what to do in case of emergency\ urgent symptoms.  The patient was advised to call back or seek an in-person evaluation if the symptoms worsen or if the condition fails to improve as anticipated.   Return for 3-4 mo- DM, BP, CHol etc; told needs medicare wellness as well.    Medications Discontinued During This Encounter  Medication Reason  . NONFORMULARY OR COMPOUNDED ITEM Completed Course    I provided 22 minutes of non-face-to-face time during this encounter,with over 50% of the time in direct counseling on patients medical conditions/ medical concerns.  Additional time was spent with charting and coordination of care after the actual visit commenced.   Note:  This note was prepared with assistance of Dragon voice recognition software. Occasional wrong-word or sound-a-like substitutions may have occurred due to the inherent limitations of voice recognition software.  George Dance, DO     Patient Care Team    Relationship Specialty Notifications Start End  George Dance, DO PCP - General Family Medicine  09/01/17   Jerrell Belfast, MD Consulting Physician Otolaryngology  03/16/18   Jarome Matin, MD Consulting Physician Dermatology  03/16/18   Franchot Gallo, MD Consulting Physician Urology  07/27/18   Jamse Arn, MD Consulting Physician Physical Medicine and Rehabilitation  07/27/18   Deneise Lever, MD Consulting Physician Pulmonary Disease  07/27/18   Park Liter, MD Consulting Physician Cardiology  11/27/18   Garry Heater, MD Referring Physician Dermatology  06/02/19      -Vitals obtained; medications/ allergies reconciled;  personal medical, social, Sx etc.histories were updated by CMA, reviewed by me and are reflected in chart   Patient Active Problem List   Diagnosis Date Noted  . Hypertension associated with diabetes ([place]) 08/05/2017    Priority: High  . Mixed diabetic hyperlipidemia associated with type 2 diabetes mellitus ([place]) 08/05/2017    Priority: High  . Microalbuminuria due to type 2 diabetes mellitus ([place]) 08/05/2017    Priority: High  . Type 2 diabetes mellitus with stage 3 chronic kidney disease, without long-term current use of insulin ([place]) 05/05/2017    Priority: High  . Recurrent right inguinal hernia 05/18/2019  . Atypical chest pain 05/07/2019  . High risk medication use 01/15/2019  . Venous insufficiency of both lower extremities 11/27/2018  . Tremor of both hands-  both wiht rest and activity; acutely worse 11/27/2018  . Orthostatic hypotension 11/05/2018  . Cancer ([place]) 07/27/2018  . Folliculitis- scalp; txed by Derm- Dr Jarome Matin 07/27/2018  . Bilateral lower extremity edema 11/17/2017  . RLS (restless legs syndrome) 06/17/2017  . GERD (gastroesophageal reflux disease) 05/05/2017  . Glossodynia 05/05/2017  . Costochondritis 05/05/2017  . Stage 3 chronic kidney disease ([place]) 04/22/2017  . Abdominal pain 03/25/2017  . CAD (coronary artery disease) 03/25/2017  . Diabetes mellitus without complication ([place]) 09/60/4540  . Snoring 03/25/2017  . Urinary retention due to benign prostatic hyperplasia 01/25/2015  .  Primary osteoarthritis of right shoulder 01/24/2015  . Edema, peripheral 11/22/2014     Current Meds  Medication Sig  . aspirin 81 MG tablet Take 81 mg by mouth every evening.   Marland Kitchen atorvastatin (LIPITOR) 10 MG tablet Take 1 tablet (10 mg total)  by mouth daily. (Patient taking differently: Take 10-15 mg by mouth See admin instructions. Take 1 tablet (81m) on Tues, Thur, Sat, and Sun. Take 1 1/2 tablet (166m on Mon, Wed, and Fri)  . Cobalamine Combinations (B-M-38 FOLIC ACID PO) Take 1 capsule by mouth every evening.   . Marland Kitchenlimepiride (AMARYL) 1 MG tablet Take 1 tablet (1 mg total) by mouth daily with breakfast.  . losartan (COZAAR) 25 MG tablet TAKE 1 TABLET DAILY (Patient taking differently: Take 25 mg by mouth daily. )  . metFORMIN (GLUCOPHAGE) 500 MG tablet Take 1 tablet (500 mg total) by mouth 2 (two) times daily with a meal.  . Polyethyl Glycol-Propyl Glycol (SYSTANE OP) Place 1 drop into both eyes daily.  . polyethylene glycol (MIRALAX / GLYCOLAX) packet Take 17 g by mouth daily as needed for mild constipation.   . Polyethylene Glycol 400 (BLINK TEARS OP) Place 1 drop into both eyes 4 (four) times daily as needed (dry eyes).  . Probiotic Product (PROBIOTIC DAILY PO) Take 2 capsules by mouth daily with lunch.  . traMADol (ULTRAM) 50 MG tablet Take 1 tablet (50 mg total) by mouth every 6 (six) hours as needed (mild pain).     Allergies:  Allergies  Allergen Reactions  . Exenatide Nausea And Vomiting  . Meperidine And Related Other (See Comments)    Red streak, that happened once while having an IV  . Nadolol Other (See Comments)    Numbness in fingers  . Sitagliptin Phosphate [Sitagliptin] Other (See Comments)    Weight loss      ROS:  See above HPI for pertinent positives and negatives   Objective:   Blood pressure 122/78, pulse 62, height 6' (1.829 m), weight 153 lb (69.4 kg).  (if some vitals are omitted, this means that patient was UNABLE to obtain them even though they  were asked to get them prior to OV today.  They were asked to call usKoreat their earliest convenience with these once obtained. )  General: A & O * 3; sounds in no acute distress; in usual state of health.  Skin: Pt confirms warm and dry extremities and pink fingertips HEENT: Pt confirms lips non-cyanotic Chest: Patient confirms normal chest excursion and movement Respiratory: speaking in full sentences, no conversational dyspnea; patient confirms no use of accessory muscles Psych: insight appears good, mood- appears full

## 2019-06-09 DIAGNOSIS — Z85828 Personal history of other malignant neoplasm of skin: Secondary | ICD-10-CM | POA: Diagnosis not present

## 2019-06-09 DIAGNOSIS — L989 Disorder of the skin and subcutaneous tissue, unspecified: Secondary | ICD-10-CM | POA: Diagnosis not present

## 2019-06-23 DIAGNOSIS — L989 Disorder of the skin and subcutaneous tissue, unspecified: Secondary | ICD-10-CM | POA: Diagnosis not present

## 2019-07-12 ENCOUNTER — Other Ambulatory Visit: Payer: Self-pay | Admitting: Family Medicine

## 2019-07-12 DIAGNOSIS — E782 Mixed hyperlipidemia: Secondary | ICD-10-CM

## 2019-07-12 DIAGNOSIS — E1169 Type 2 diabetes mellitus with other specified complication: Secondary | ICD-10-CM

## 2019-07-14 DIAGNOSIS — L821 Other seborrheic keratosis: Secondary | ICD-10-CM | POA: Diagnosis not present

## 2019-07-14 DIAGNOSIS — L570 Actinic keratosis: Secondary | ICD-10-CM | POA: Diagnosis not present

## 2019-07-14 DIAGNOSIS — L989 Disorder of the skin and subcutaneous tissue, unspecified: Secondary | ICD-10-CM | POA: Diagnosis not present

## 2019-07-14 DIAGNOSIS — Z5181 Encounter for therapeutic drug level monitoring: Secondary | ICD-10-CM | POA: Diagnosis not present

## 2019-07-14 DIAGNOSIS — L131 Subcorneal pustular dermatitis: Secondary | ICD-10-CM | POA: Diagnosis not present

## 2019-07-14 DIAGNOSIS — Z79899 Other long term (current) drug therapy: Secondary | ICD-10-CM | POA: Diagnosis not present

## 2019-08-02 ENCOUNTER — Other Ambulatory Visit: Payer: Self-pay | Admitting: Family Medicine

## 2019-08-02 ENCOUNTER — Telehealth: Payer: Medicare Other | Admitting: Cardiology

## 2019-08-02 DIAGNOSIS — E1169 Type 2 diabetes mellitus with other specified complication: Secondary | ICD-10-CM

## 2019-08-09 ENCOUNTER — Ambulatory Visit: Payer: Medicare Other | Admitting: Cardiology

## 2019-08-11 DIAGNOSIS — Z872 Personal history of diseases of the skin and subcutaneous tissue: Secondary | ICD-10-CM | POA: Diagnosis not present

## 2019-08-11 DIAGNOSIS — L570 Actinic keratosis: Secondary | ICD-10-CM | POA: Diagnosis not present

## 2019-08-11 DIAGNOSIS — Z79899 Other long term (current) drug therapy: Secondary | ICD-10-CM | POA: Diagnosis not present

## 2019-08-11 DIAGNOSIS — L989 Disorder of the skin and subcutaneous tissue, unspecified: Secondary | ICD-10-CM | POA: Diagnosis not present

## 2019-08-11 DIAGNOSIS — L988 Other specified disorders of the skin and subcutaneous tissue: Secondary | ICD-10-CM | POA: Diagnosis not present

## 2019-08-11 DIAGNOSIS — Z85828 Personal history of other malignant neoplasm of skin: Secondary | ICD-10-CM | POA: Diagnosis not present

## 2019-08-19 ENCOUNTER — Ambulatory Visit (INDEPENDENT_AMBULATORY_CARE_PROVIDER_SITE_OTHER): Payer: Medicare Other | Admitting: Cardiology

## 2019-08-19 ENCOUNTER — Other Ambulatory Visit: Payer: Self-pay

## 2019-08-19 ENCOUNTER — Encounter: Payer: Self-pay | Admitting: Cardiology

## 2019-08-19 VITALS — BP 160/80 | HR 70 | Wt 158.2 lb

## 2019-08-19 DIAGNOSIS — R0789 Other chest pain: Secondary | ICD-10-CM | POA: Diagnosis not present

## 2019-08-19 DIAGNOSIS — I951 Orthostatic hypotension: Secondary | ICD-10-CM

## 2019-08-19 DIAGNOSIS — I10 Essential (primary) hypertension: Secondary | ICD-10-CM

## 2019-08-19 DIAGNOSIS — I2510 Atherosclerotic heart disease of native coronary artery without angina pectoris: Secondary | ICD-10-CM

## 2019-08-19 DIAGNOSIS — E1159 Type 2 diabetes mellitus with other circulatory complications: Secondary | ICD-10-CM | POA: Diagnosis not present

## 2019-08-19 DIAGNOSIS — I152 Hypertension secondary to endocrine disorders: Secondary | ICD-10-CM

## 2019-08-19 NOTE — Patient Instructions (Signed)
Medication Instructions:  Your physician recommends that you continue on your current medications as directed. Please refer to the Current Medication list given to you today.  If you need a refill on your cardiac medications before your next appointment, please call your pharmacy.   Lab work: None.   If you have labs (blood work) drawn today and your tests are completely normal, you will receive your results only by: . MyChart Message (if you have MyChart) OR . A paper copy in the mail If you have any lab test that is abnormal or we need to change your treatment, we will call you to review the results.  Testing/Procedures: None.   Follow-Up: At [facility], you and your health needs are our priority.  As part of our continuing mission to provide you with exceptional heart care, we have created designated Provider Care Teams.  These Care Teams include your primary Cardiologist (physician) and Advanced Practice Providers (APPs -  Physician Assistants and Nurse Practitioners) who all work together to provide you with the care you need, when you need it. You will need a follow up appointment in 6 months.  Please call our office 2 months in advance to schedule this appointment.  You may see No primary care provider on file. or another member of our [facility] Provider Team in [place]: Brian Munley, MD . Rajan Revankar, MD  Any Other Special Instructions Will Be Listed Below (If Applicable).    

## 2019-08-19 NOTE — Progress Notes (Signed)
Cardiology Office Note:    Date:  08/19/2019   ID:  George Barnett, DOB Sep 15, 1933, MRN [id number]  PCP:  Mellody Dance, DO  Cardiologist:  Jenne Campus, MD    Referring MD: Mellody Dance, DO   Chief Complaint  Patient presents with  . Follow-up  Cardiac wise doing well  History of Present Illness:    George Barnett is a 85 y.o. male  with past medical history significant for coronary artery disease in 1999 he got small myocardial infarction that required stenting however I do not have details about that event.  Interestingly before that episodes he did not have much chest pain.  Apparently he did have a stress test and then he was rash after stress test to cardiac cath laboratory and after he has been fixed.  I am seeing him for orthostatic hypotension.  He also does have diabetes as well as dyslipidemia and essential hypertension . He comes today to my office for follow-up cardiac wise doing well he did have hernia surgery done there was no trouble getting better.  Biggest complaint alcohol voice is chronic scalp problem which give him pain as well as burning and it is very difficult for him to do with cardiac wise doing well can walk climb stairs no difficulties.  Past Medical History:  Diagnosis Date  . Cancer [facility])    SKIN CANCER   . Cataract   . Diabetes mellitus without complication ([place])   . Elbow effusion, left   . Frequency of urination   . GERD (gastroesophageal reflux disease)   . Heart attack ([place])   . Heart disease   . Hypertension   . Primary osteoarthritis of right shoulder 01/24/2015  . Prostate enlargement   . Tremors of nervous system   . Urinary retention due to benign prostatic hyperplasia 01/25/2015    Past Surgical History:  Procedure Laterality Date  . CARDIAC CATHETERIZATION     1999  . cardio stint    . HERNIA REPAIR     BILAT   . INGUINAL HERNIA REPAIR  05/18/2019  . INGUINAL HERNIA REPAIR Right 05/18/2019   Procedure: REPAIR RIGHT  INGUINAL HERNIA WITH MESH;  Surgeon: Erroll Luna, MD;  Location: [place];  Service: General;  Laterality: Right;  . MOHS SURGERY     BILAT EARS   . PROSTATE BIOPSY    . TOTAL SHOULDER ARTHROPLASTY Right 01/24/2015   Procedure: RIGHT TOTAL SHOULDER ARTHROPLASTY;  Surgeon: Marchia Bond, MD;  Location: [place];  Service: Orthopedics;  Laterality: Right;    Current Medications: Current Meds  Medication Sig  . aspirin 81 MG tablet Take 81 mg by mouth every evening.   Marland Kitchen atorvastatin (LIPITOR) 10 MG tablet Take 1-1.5 tablets (10-15 mg total) by mouth See admin instructions. Take 1 tablet (10mg ) on Tues, Thur, Sat, and Sun. Take 1 1/2 tablet (15mg ) on Mon, Wed, and Fri  . Cobalamine Combinations (U-31 + FOLIC ACID PO) Take 1 capsule by mouth every evening.   . dapsone 25 MG tablet Take 3 tablets by mouth daily.  Marland Kitchen glimepiride (AMARYL) 1 MG tablet Take 1 tablet (1 mg total) by mouth daily with breakfast.  . losartan (COZAAR) 25 MG tablet TAKE 1 TABLET DAILY (Patient taking differently: Take 25 mg by mouth daily. )  . metFORMIN (GLUCOPHAGE) 500 MG tablet Take 1 tablet (500 mg total) by mouth 2 (two) times daily with a meal.  . Polyethyl Glycol-Propyl Glycol (SYSTANE OP) Place 1 drop into both eyes daily.  Marland Kitchen  polyethylene glycol (MIRALAX / GLYCOLAX) packet Take 17 g by mouth daily as needed for mild constipation.   . Polyethylene Glycol 400 (BLINK TEARS OP) Place 1 drop into both eyes 4 (four) times daily as needed (dry eyes).  . Probiotic Product (PROBIOTIC DAILY PO) Take 2 capsules by mouth daily with lunch.  . silver sulfADIAZINE (SILVADENE) 1 % cream Apply topically 2 (two) times daily.     Allergies:   Exenatide, Meperidine and related, Nadolol, and Sitagliptin phosphate [sitagliptin]   Social History   Socioeconomic History  . Marital status: Married    Spouse name: Not on file  . Number of children: Not on file  . Years of education: Not on file  . Highest education level: Not on file   Occupational History  . Not on file  Social Needs  . Financial resource strain: Not on file  . Food insecurity    Worry: Not on file    Inability: Not on file  . Transportation needs    Medical: Not on file    Non-medical: Not on file  Tobacco Use  . Smoking status: Never Smoker  . Smokeless tobacco: Never Used  Substance and Sexual Activity  . Alcohol use: No  . Drug use: No  . Sexual activity: Not on file  Lifestyle  . Physical activity    Days per week: Not on file    Minutes per session: Not on file  . Stress: Not on file  Relationships  . Social Herbalist on phone: Not on file    Gets together: Not on file    Attends religious service: Not on file    Active member of club or organization: Not on file    Attends meetings of clubs or organizations: Not on file    Relationship status: Not on file  Other Topics Concern  . Not on file  Social History Narrative  . Not on file     Family History: The patient's family history includes Hypertension in his brother. ROS:   Please see the history of present illness.    All 14 point review of systems negative except as described per history of present illness  EKGs/Labs/Other Studies Reviewed:      Recent Labs: 08/25/2018: NT-Pro BNP 368 02/01/2019: Magnesium 1.6 05/14/2019: ALT 13; BUN 25; Potassium 4.4; Sodium 138 05/18/2019: Creatinine, Ser 1.36; Hemoglobin 11.5; Platelets 163  Recent Lipid Panel    Component Value Date/Time   CHOL 105 03/17/2018 0926   TRIG 52 03/17/2018 0926   HDL 49 03/17/2018 0926   CHOLHDL 2.1 03/17/2018 0926   LDLCALC 46 03/17/2018 0926    Physical Exam:    VS:  BP (!) 160/80   Pulse 70   Wt 158 lb 3.2 oz (71.8 kg)   SpO2 95%   BMI 21.46 kg/m     Wt Readings from Last 3 Encounters:  08/19/19 158 lb 3.2 oz (71.8 kg)  06/02/19 153 lb (69.4 kg)  05/18/19 153 lb 1.6 oz (69.4 kg)     GEN:  Well nourished, well developed in no acute distress HEENT: Normal NECK: No JVD;  No carotid bruits LYMPHATICS: No lymphadenopathy CARDIAC: RRR, no murmurs, no rubs, no gallops RESPIRATORY:  Clear to auscultation without rales, wheezing or rhonchi  ABDOMEN: Soft, non-tender, non-distended MUSCULOSKELETAL:  No edema; No deformity  SKIN: Warm and dry LOWER EXTREMITIES: no swelling NEUROLOGIC:  Alert and oriented x 3 PSYCHIATRIC:  Normal affect   ASSESSMENT:  1. Coronary artery disease involving native coronary artery of native heart without angina pectoris   2. Orthostatic hypotension   3. Hypertension associated with diabetes ([place])   4. Atypical chest pain    PLAN:    In order of problems listed above:  1. Coronary disease recent stress test was negative.  He went to surgery with no difficulties we will continue monitoring. 2. Essential hypertension blood pressure elevated in the office but he brought blood pressure measurement from home on dose of good.  Denies having any orthostatic signs.  He can walk around and do what he wants with no major difficulties. 3. Dyslipidemia he is on Lipitor 10 which I will continue will call Primary care physician to get his fasting blood profile 4. Diabetes on Glucophage which I will continue.   Medication Adjustments/Labs and Tests Ordered: Current medicines are reviewed at length with the patient today.  Concerns regarding medicines are outlined above.  No orders of the defined types were placed in this encounter.  Medication changes: No orders of the defined types were placed in this encounter.   Signed, Park Liter, MD, [facility] 08/19/2019 3:03 PM    [place]

## 2019-08-26 ENCOUNTER — Other Ambulatory Visit: Payer: Self-pay | Admitting: Surgery

## 2019-08-26 DIAGNOSIS — R1031 Right lower quadrant pain: Secondary | ICD-10-CM

## 2019-08-29 DIAGNOSIS — Z23 Encounter for immunization: Secondary | ICD-10-CM | POA: Diagnosis not present

## 2019-09-02 ENCOUNTER — Ambulatory Visit
Admission: RE | Admit: 2019-09-02 | Discharge: 2019-09-02 | Disposition: A | Discharge status: Home / Self Care | Payer: Medicare Other | Source: Ambulatory Visit | Attending: Surgery | Admitting: Surgery

## 2019-09-02 ENCOUNTER — Other Ambulatory Visit: Payer: Self-pay

## 2019-09-02 DIAGNOSIS — N433 Hydrocele, unspecified: Secondary | ICD-10-CM | POA: Diagnosis not present

## 2019-09-02 DIAGNOSIS — N3289 Other specified disorders of bladder: Secondary | ICD-10-CM | POA: Diagnosis not present

## 2019-09-02 DIAGNOSIS — R1031 Right lower quadrant pain: Secondary | ICD-10-CM

## 2019-09-02 MED ORDER — IOPAMIDOL (ISOVUE-300) INJECTION 61%
100.0000 mL | Freq: Once | INTRAVENOUS | Status: AC | PRN
  Administered 2019-09-02: 10:00:00 80 mL via INTRAVENOUS

## 2019-09-22 DIAGNOSIS — L570 Actinic keratosis: Secondary | ICD-10-CM | POA: Diagnosis not present

## 2019-09-22 DIAGNOSIS — L989 Disorder of the skin and subcutaneous tissue, unspecified: Secondary | ICD-10-CM | POA: Diagnosis not present

## 2019-09-22 DIAGNOSIS — L905 Scar conditions and fibrosis of skin: Secondary | ICD-10-CM | POA: Diagnosis not present

## 2019-09-22 DIAGNOSIS — Z79899 Other long term (current) drug therapy: Secondary | ICD-10-CM | POA: Diagnosis not present

## 2019-09-22 DIAGNOSIS — Z5181 Encounter for therapeutic drug level monitoring: Secondary | ICD-10-CM | POA: Diagnosis not present

## 2019-09-22 DIAGNOSIS — L131 Subcorneal pustular dermatitis: Secondary | ICD-10-CM | POA: Diagnosis not present

## 2019-10-20 DIAGNOSIS — Z85828 Personal history of other malignant neoplasm of skin: Secondary | ICD-10-CM | POA: Diagnosis not present

## 2019-10-20 DIAGNOSIS — Z5181 Encounter for therapeutic drug level monitoring: Secondary | ICD-10-CM | POA: Diagnosis not present

## 2019-10-20 DIAGNOSIS — Z79899 Other long term (current) drug therapy: Secondary | ICD-10-CM | POA: Diagnosis not present

## 2019-10-20 DIAGNOSIS — L989 Disorder of the skin and subcutaneous tissue, unspecified: Secondary | ICD-10-CM | POA: Diagnosis not present

## 2019-10-20 DIAGNOSIS — Z872 Personal history of diseases of the skin and subcutaneous tissue: Secondary | ICD-10-CM | POA: Diagnosis not present

## 2019-10-29 ENCOUNTER — Other Ambulatory Visit: Payer: Self-pay | Admitting: Family Medicine

## 2019-10-29 ENCOUNTER — Telehealth: Payer: Self-pay | Admitting: Family Medicine

## 2019-10-29 DIAGNOSIS — E1169 Type 2 diabetes mellitus with other specified complication: Secondary | ICD-10-CM

## 2019-10-29 MED ORDER — METFORMIN HCL 500 MG PO TABS: 500.0000 mg | ORAL_TABLET | Freq: Two times a day (BID) | ORAL | 0 refills | Status: DC

## 2019-10-29 NOTE — Telephone Encounter (Signed)
Patient needs refill on Metformin sent to [facility].  Dose: 500 mg Route: Oral Frequency: 2 times daily with meals  Dispense Quantity: 180 tablet Refills: 1   Indications of Use: Type 2 Diabetes Mellitus      Sig: Take 1 tablet (500 mg total) by mouth 2 (two) times daily with a meal.      Start Date: 03/03/19 End Date: --  Written Date: 03/03/19 Expiration Date: 03/02/20    Diagnosis Association: Type 2 diabetes mellitus with other specified complication, without long-term current use of insulin (H[facility]) (E11.69)  Original Order:  metFORMIN (GLUCOPHAGE) 500 MG tablet [[id number]]

## 2019-10-29 NOTE — Telephone Encounter (Signed)
Patient is aware medication has been refilled for 30 days and to keep his apt with [place] for 11/25/19. AS, CMA

## 2019-11-17 ENCOUNTER — Other Ambulatory Visit: Payer: Self-pay | Admitting: Family Medicine

## 2019-11-17 DIAGNOSIS — E1169 Type 2 diabetes mellitus with other specified complication: Secondary | ICD-10-CM

## 2019-11-25 ENCOUNTER — Encounter: Payer: Self-pay | Admitting: Family Medicine

## 2019-11-25 ENCOUNTER — Ambulatory Visit (INDEPENDENT_AMBULATORY_CARE_PROVIDER_SITE_OTHER): Payer: Medicare Other | Admitting: Family Medicine

## 2019-11-25 ENCOUNTER — Other Ambulatory Visit: Payer: Self-pay

## 2019-11-25 VITALS — Ht 72.0 in | Wt 155.0 lb

## 2019-11-25 DIAGNOSIS — E782 Mixed hyperlipidemia: Secondary | ICD-10-CM

## 2019-11-25 DIAGNOSIS — Z23 Encounter for immunization: Secondary | ICD-10-CM | POA: Diagnosis not present

## 2019-11-25 DIAGNOSIS — Z0000 Encounter for general adult medical examination without abnormal findings: Secondary | ICD-10-CM

## 2019-11-25 DIAGNOSIS — E1169 Type 2 diabetes mellitus with other specified complication: Secondary | ICD-10-CM | POA: Diagnosis not present

## 2019-11-25 DIAGNOSIS — R5383 Other fatigue: Secondary | ICD-10-CM

## 2019-11-25 DIAGNOSIS — N183 Chronic kidney disease, stage 3 unspecified: Secondary | ICD-10-CM | POA: Diagnosis not present

## 2019-11-25 DIAGNOSIS — Z114 Encounter for screening for human immunodeficiency virus [HIV]: Secondary | ICD-10-CM | POA: Diagnosis not present

## 2019-11-25 DIAGNOSIS — Z1159 Encounter for screening for other viral diseases: Secondary | ICD-10-CM | POA: Diagnosis not present

## 2019-11-25 DIAGNOSIS — C801 Malignant (primary) neoplasm, unspecified: Secondary | ICD-10-CM

## 2019-11-25 DIAGNOSIS — R809 Proteinuria, unspecified: Secondary | ICD-10-CM

## 2019-11-25 DIAGNOSIS — E1129 Type 2 diabetes mellitus with other diabetic kidney complication: Secondary | ICD-10-CM | POA: Diagnosis not present

## 2019-11-25 DIAGNOSIS — E1122 Type 2 diabetes mellitus with diabetic chronic kidney disease: Secondary | ICD-10-CM | POA: Diagnosis not present

## 2019-11-25 DIAGNOSIS — I10 Essential (primary) hypertension: Secondary | ICD-10-CM | POA: Diagnosis not present

## 2019-11-25 DIAGNOSIS — E1159 Type 2 diabetes mellitus with other circulatory complications: Secondary | ICD-10-CM | POA: Diagnosis not present

## 2019-11-25 MED ORDER — ATORVASTATIN CALCIUM 10 MG PO TABS: ORAL_TABLET | ORAL | 1 refills | Status: DC

## 2019-11-25 MED ORDER — GLIMEPIRIDE 1 MG PO TABS: 1.0000 mg | ORAL_TABLET | Freq: Every day | ORAL | 1 refills | Status: DC

## 2019-11-25 MED ORDER — METFORMIN HCL 500 MG PO TABS: 500.0000 mg | ORAL_TABLET | Freq: Two times a day (BID) | ORAL | 1 refills | Status: DC

## 2019-11-25 MED ORDER — LOSARTAN POTASSIUM 25 MG PO TABS: 25.0000 mg | ORAL_TABLET | Freq: Every day | ORAL | 1 refills | Status: DC

## 2019-11-25 MED ORDER — SHINGRIX 50 MCG/0.5ML IM SUSR: 0.5000 mL | Freq: Once | INTRAMUSCULAR | 0 refills | Status: AC

## 2019-11-25 NOTE — Progress Notes (Signed)
Subjective:   George Barnett is a 86 y.o. male who presents for Medicare Annual/Subsequent preventive examination.  HPI: States "the memory questions about wore me out; it took a lot of effort for me to do that." Denies concerns about memory. States "my memory isn't as good as it used to be, for sure." Denies hearing concerns. Denies recent falls. States he is not using any assistive devices such as walkers or canes to ambulate at home. Patient reports that his hand tremors have increased. Notes he does have an issue with his feet swelling. He elevates his feet while he sits, and uses stockings, but notes he continues having problems with swelling. He does not exercise; states he cannot go outside with his scalp issues. "I can't wear a hat, it irritates it, so I'm not as active as I was." Reports he's having a scalp problem; "I've been told there's no cure for it, and it is the worst thing I've gone through in my life." He follows up for this with dermatology through [facility]. Notes this will be his third week of specialist treatment, and he finished up 14 days of doxycycline a week ago. He is also taking prednisone. Notes his blood sugars "bounce around." Says they will be in a pretty good range, then all of a sudden the pop up for several readings, and drop back down. "I can't account for the change, unless it is the antibiotics and prednisone."      Objective:    Vitals: Ht 6' (1.829 m)   Wt 155 lb (70.3 kg)   BMI 21.02 kg/m   Body mass index is 21.02 kg/m.  Advanced Directives 05/18/2019 05/14/2019 04/29/2019 08/22/2017 07/04/2017 06/13/2017 05/29/2017  Does Patient Have a Medical Advance Directive? Yes Yes No;Yes Yes No;Yes No No  Type of Paramedic of [place];Living will [place];Living will - [place];Living will Living will - -  Does patient want to make changes to medical advance directive? No - Patient declined No -  Patient declined No - Patient declined - - - -  Copy of [place] in Chart? No - copy requested - - No - copy requested - - -  Would patient like information on creating a medical advance directive? - - - - - No - Patient declined No - Patient declined    Tobacco Social History   Tobacco Use  Smoking Status Never Smoker  Smokeless Tobacco Never Used     Counseling given: Not Answered   Clinical Intake:                       Past Medical History:  Diagnosis Date  . Cancer [facility])    SKIN CANCER   . Cataract   . Diabetes mellitus without complication ([place])   . Elbow effusion, left   . Frequency of urination   . GERD (gastroesophageal reflux disease)   . Heart attack ([place])   . Heart disease   . Hypertension   . Primary osteoarthritis of right shoulder 01/24/2015  . Prostate enlargement   . Tremors of nervous system   . Urinary retention due to benign prostatic hyperplasia 01/25/2015   Past Surgical History:  Procedure Laterality Date  . CARDIAC CATHETERIZATION     1999  . cardio stint    . HERNIA REPAIR     BILAT   . INGUINAL HERNIA REPAIR  05/18/2019  . INGUINAL HERNIA REPAIR  Right 05/18/2019   Procedure: REPAIR RIGHT INGUINAL HERNIA WITH MESH;  Surgeon: Erroll Luna, MD;  Location: [place];  Service: General;  Laterality: Right;  . MOHS SURGERY     BILAT EARS   . PROSTATE BIOPSY    . TOTAL SHOULDER ARTHROPLASTY Right 01/24/2015   Procedure: RIGHT TOTAL SHOULDER ARTHROPLASTY;  Surgeon: Marchia Bond, MD;  Location: [place];  Service: Orthopedics;  Laterality: Right;   Family History  Problem Relation Age of Onset  . Hypertension Brother    Social History   Socioeconomic History  . Marital status: Married    Spouse name: Not on file  . Number of children: Not on file  . Years of education: Not on file  . Highest education level: Not on file  Occupational History  . Not on file  Tobacco Use  . Smoking status: Never Smoker  .  Smokeless tobacco: Never Used  Substance and Sexual Activity  . Alcohol use: No  . Drug use: No  . Sexual activity: Not on file  Other Topics Concern  . Not on file  Social History Narrative  . Not on file   Social Determinants of Health   Financial Resource Strain:   . Difficulty of Paying Living Expenses: Not on file  Food Insecurity:   . Worried About Charity fundraiser in the Last Year: Not on file  . Ran Out of Food in the Last Year: Not on file  Transportation Needs:   . Lack of Transportation (Medical): Not on file  . Lack of Transportation (Non-Medical): Not on file  Physical Activity:   . Days of Exercise per Week: Not on file  . Minutes of Exercise per Session: Not on file  Stress:   . Feeling of Stress : Not on file  Social Connections:   . Frequency of Communication with Friends and Family: Not on file  . Frequency of Social Gatherings with Friends and Family: Not on file  . Attends Religious Services: Not on file  . Active Member of Clubs or Organizations: Not on file  . Attends Archivist Meetings: Not on file  . Marital Status: Not on file    Outpatient Encounter Medications as of 11/25/2019  Medication Sig  . aspirin 81 MG tablet Take 81 mg by mouth every evening.   Marland Kitchen atorvastatin (LIPITOR) 10 MG tablet Take 1 tablet on Tuesday, Thursday, Saturday and Sunday and take 1 1/2 tablets on Monday, Wednesday and Friday  . clobetasol ointment (TEMOVATE) 0.05 %   . Cobalamine Combinations (I-71 + FOLIC ACID PO) Take 1 capsule by mouth every evening.   . dapsone 25 MG tablet Take 3 tablets by mouth daily.  Marland Kitchen glimepiride (AMARYL) 1 MG tablet Take 1 tablet (1 mg total) by mouth daily with breakfast.  . losartan (COZAAR) 25 MG tablet Take 1 tablet (25 mg total) by mouth daily.  . metFORMIN (GLUCOPHAGE) 500 MG tablet Take 1 tablet (500 mg total) by mouth 2 (two) times daily with a meal.  . Polyethyl Glycol-Propyl Glycol (SYSTANE OP) Place 1 drop into both eyes  daily.  . polyethylene glycol (MIRALAX / GLYCOLAX) packet Take 17 g by mouth daily as needed for mild constipation.   . Polyethylene Glycol 400 (BLINK TEARS OP) Place 1 drop into both eyes 4 (four) times daily as needed (dry eyes).  . predniSONE (DELTASONE) 10 MG tablet Take 10 mg by mouth daily with breakfast.  . Probiotic Product (PROBIOTIC DAILY PO) Take 2 capsules  by mouth daily with lunch.  . silver sulfADIAZINE (SILVADENE) 1 % cream Apply topically 2 (two) times daily.  . [DISCONTINUED] atorvastatin (LIPITOR) 10 MG tablet Take 1 tablet on Tuesday, Thursday, Saturday and Sunday and take 1 1/2 tablets on Monday, Wednesday and Friday **PATIENT NEEDS APT FOR FURTHER REFILLS**  . [DISCONTINUED] glimepiride (AMARYL) 1 MG tablet Take 1 tablet (1 mg total) by mouth daily with breakfast.  . [DISCONTINUED] losartan (COZAAR) 25 MG tablet TAKE 1 TABLET DAILY (Patient taking differently: Take 25 mg by mouth daily. )  . [DISCONTINUED] metFORMIN (GLUCOPHAGE) 500 MG tablet Take 1 tablet (500 mg total) by mouth 2 (two) times daily with a meal. **PATIENT NEEDS APT FOR FURTHER REFILLS**  . Zoster Vaccine Adjuvanted [facility]) injection Inject 0.5 mLs into the muscle once for 1 dose.   No facility-administered encounter medications on file as of 11/25/2019.    Activities of Daily Living In your present state of health, do you have any difficulty performing the following activities: 11/25/2019 05/18/2019  Hearing? [place]? N -  Comment - -  Difficulty concentrating or making decisions? Y -  Walking or climbing stairs? N -  Dressing or bathing? N -  Doing errands, shopping? N N  Some recent data might be hidden    Patient Care Team: Mellody Dance, DO as PCP - General (Family Medicine) Jerrell Belfast, MD as Consulting Physician (Otolaryngology) Jarome Matin, MD as Consulting Physician (Dermatology) Franchot Gallo, MD as Consulting Physician (Urology) Jamse Arn, MD as Consulting  Physician (Physical Medicine and Rehabilitation) Deneise Lever, MD as Consulting Physician (Pulmonary Disease) Park Liter, MD as Consulting Physician (Cardiology) Garry Heater, MD as Referring Physician (Dermatology)   Assessment:   This is a routine wellness examination for George Barnett.  Exercise Activities and Dietary recommendations    Goals   None     Fall Risk Fall Risk  11/25/2019 11/27/2018 01/15/2018 10/22/2017 08/22/2017  Falls in the past year? 0 0 No No No  Comment - - - - -  Number falls in past yr: 0 - - - -  Injury with Fall? 0 - - - -  Risk for fall due to : - Impaired balance/gait;Impaired mobility - - -  Follow up Falls evaluation completed Falls evaluation completed - - -   Is the patient's home free of loose throw rugs in walkways, pet beds, electrical cords, etc?   yes      Grab bars in the bathroom? yes      Handrails on the stairs?   no      Adequate lighting?   yes  Timed Get Up and Go Performed: Telehealth   Depression Screen PHQ 2/9 Scores 11/25/2019 03/03/2019 01/15/2019 11/27/2018  PHQ - 2 Score 2 2 0 0  PHQ- 9 Score 3 2 0 0    Cognitive Function     6CIT Screen 11/25/2019  What Year? 0 points  What month? 0 points  What time? 0 points  Count back from 20 0 points  Months in reverse 0 points  Repeat phrase 0 points  Total Score 0    Immunization History  Administered Date(s) Administered  . Influenza Nasal 08/15/2018  . Influenza-Unspecified 08/22/2014, 09/17/2015, 08/26/2016, 08/28/2017, 08/19/2019  . Pneumococcal Conjugate-13 03/25/2017  . Pneumococcal Polysaccharide-23 02/16/2013  . Tdap 04/25/2010  . Zoster 01/03/2014, 08/18/2014    Qualifies for Shingles Vaccine? Ordered for pharmacy TDAP: patient is aware he needs tdap and states he will get at  next in office visit Pneymococcal: Patient is UTD Influenza: Patient said he would get next time in office for visit  Screening Tests Health Maintenance  Topic Date Due   . HEMOGLOBIN A1C  11/13/2019  . FOOT EXAM  11/28/2019  . OPHTHALMOLOGY EXAM  04/20/2020  . TETANUS/TDAP  04/25/2020  . INFLUENZA VACCINE  Completed  . PNA vac Low Risk Adult  Completed   Cancer Screenings: Lung: Low Dose CT Chest recommended if Age 17-80 years, 30 pack-year currently smoking OR have quit w/in 15years. Patient does not qualify. Colorectal: Patient states he has had Colonoscopy but its been atleast 10 years. Requesting these records from Dr. Lavetta Nielsen office.   Additional Screenings: Hepatitis C Screening: ordered for future labs HIV Screening: ordered for future labs      Plan:    2:30 PM 11/25/2019 MEDICARE WELLNESS PLAN: - Per patient, has obtained recent influenza vaccination through [facility] in October of 2020. -  need for updated TDAP. - Need for shingles vaccination. Education provided to patient today. - Counseling provided to patient today regarding expectations for COVID-19 vaccination. Encouraged patient to seek information and updates through online resources as advised, including the [facility] website or the [place] website. - Encouraged patient to utilize cane or walker at home to assist with ambulation, and continue prevention of falls. - Discussed importance of regular physical activity, to help improve and control LE swelling and improve overall wellbeing. To help slow decline of memory, encouraged ongoing exercise, eating a healthy high-antioxidant diet, and engaging in activities that create new pathways in the brain, such as puzzles, learning new languages, learning new instruments, and reading.  Follow-up:   - Will re-check patient's A1c near future with full fasting lab work, and urine sample to assess for microalbuminuria secondary to his diabetes.  - Patient will return in near future for OV after lab work to discuss labs and any additional concerns. (His chronic venous insufficiency in his bilateral lower extremity along with  his chronic hand tremors)  . I have personally reviewed and noted the following in the patient's chart:   . Medical and social history . Use of alcohol, tobacco or illicit drugs  . Current medications and supplements . Functional ability and status . Nutritional status . Physical activity . Advanced directives . List of other physicians . Hospitalizations, surgeries, and ER visits in previous 12 months . Vitals . Screenings to include cognitive, depression, and falls . Referrals and appointments  In addition, I have reviewed and discussed with patient certain preventive protocols, quality metrics, and best practice recommendations. A written personalized care plan for preventive services as well as general preventive health recommendations were provided to patient.     Mellody Dance, DO  11/25/2019

## 2019-12-01 ENCOUNTER — Ambulatory Visit: Payer: Medicare Other | Attending: Internal Medicine

## 2019-12-01 DIAGNOSIS — Z23 Encounter for immunization: Secondary | ICD-10-CM | POA: Diagnosis not present

## 2019-12-01 NOTE — Progress Notes (Signed)
   Covid-19 Vaccination Clinic  Name:  Kaipo Ardis    MRN: [id number] DOB: 1933/01/13  12/01/2019  Mr. Brue was observed post Covid-19 immunization for 15 minutes without incidence. He was provided with Vaccine Information Sheet and instruction to access the V-Safe system.   Mr. Verrette was instructed to call 911 with any severe reactions post vaccine: Marland Kitchen Difficulty breathing  . Swelling of your face and throat  . A fast heartbeat  . A bad rash all over your body  . Dizziness and weakness    Immunizations Administered    Name Date Dose VIS Date Route   Pfizer COVID-19 Vaccine 12/01/2019 12:44 PM 0.3 mL 10/29/2019 Intramuscular   Manufacturer: [place]   Lot: [id number]   [place]: [id number]

## 2019-12-02 DIAGNOSIS — L821 Other seborrheic keratosis: Secondary | ICD-10-CM | POA: Diagnosis not present

## 2019-12-02 DIAGNOSIS — L570 Actinic keratosis: Secondary | ICD-10-CM | POA: Diagnosis not present

## 2019-12-02 DIAGNOSIS — Z5181 Encounter for therapeutic drug level monitoring: Secondary | ICD-10-CM | POA: Diagnosis not present

## 2019-12-02 DIAGNOSIS — L988 Other specified disorders of the skin and subcutaneous tissue: Secondary | ICD-10-CM | POA: Diagnosis not present

## 2019-12-02 DIAGNOSIS — L989 Disorder of the skin and subcutaneous tissue, unspecified: Secondary | ICD-10-CM | POA: Diagnosis not present

## 2019-12-03 ENCOUNTER — Other Ambulatory Visit: Payer: Medicare Other

## 2019-12-03 ENCOUNTER — Other Ambulatory Visit: Payer: Self-pay

## 2019-12-03 DIAGNOSIS — Z5181 Encounter for therapeutic drug level monitoring: Secondary | ICD-10-CM | POA: Diagnosis not present

## 2019-12-03 DIAGNOSIS — R5383 Other fatigue: Secondary | ICD-10-CM

## 2019-12-03 DIAGNOSIS — N183 Chronic kidney disease, stage 3 unspecified: Secondary | ICD-10-CM | POA: Diagnosis not present

## 2019-12-03 DIAGNOSIS — E1159 Type 2 diabetes mellitus with other circulatory complications: Secondary | ICD-10-CM | POA: Diagnosis not present

## 2019-12-03 DIAGNOSIS — E782 Mixed hyperlipidemia: Secondary | ICD-10-CM

## 2019-12-03 DIAGNOSIS — I152 Hypertension secondary to endocrine disorders: Secondary | ICD-10-CM

## 2019-12-03 DIAGNOSIS — E1169 Type 2 diabetes mellitus with other specified complication: Secondary | ICD-10-CM

## 2019-12-03 DIAGNOSIS — Z0000 Encounter for general adult medical examination without abnormal findings: Secondary | ICD-10-CM

## 2019-12-03 DIAGNOSIS — I10 Essential (primary) hypertension: Secondary | ICD-10-CM | POA: Diagnosis not present

## 2019-12-03 DIAGNOSIS — C801 Malignant (primary) neoplasm, unspecified: Secondary | ICD-10-CM | POA: Diagnosis not present

## 2019-12-06 LAB — CBC WITH DIFFERENTIAL/PLATELET
Basophils Absolute: 0.1 10*3/uL (ref 0.0–0.2)
Basos: 1 %
EOS (ABSOLUTE): 0.1 10*3/uL (ref 0.0–0.4)
Eos: 1 %
Hematocrit: 32.2 % — ABNORMAL LOW (ref 37.5–51.0)
Hemoglobin: 10.9 g/dL — ABNORMAL LOW (ref 13.0–17.7)
Immature Grans (Abs): 0 10*3/uL (ref 0.0–0.1)
Immature Granulocytes: 1 %
Lymphocytes Absolute: 2.7 10*3/uL (ref 0.7–3.1)
Lymphs: 36 %
MCH: 35.9 pg — ABNORMAL HIGH (ref 26.6–33.0)
MCHC: 33.9 g/dL (ref 31.5–35.7)
MCV: 106 fL — ABNORMAL HIGH (ref 79–97)
Monocytes Absolute: 0.7 10*3/uL (ref 0.1–0.9)
Monocytes: 9 %
Neutrophils Absolute: 4 10*3/uL (ref 1.4–7.0)
Neutrophils: 52 %
Platelets: 128 10*3/uL — ABNORMAL LOW (ref 150–450)
RBC: 3.04 x10E6/uL — ABNORMAL LOW (ref 4.14–5.80)
RDW: 12.3 % (ref 11.6–15.4)
WBC: 7.6 10*3/uL (ref 3.4–10.8)

## 2019-12-06 LAB — HEMOGLOBIN A1C
Est. average glucose Bld gHb Est-mCnc: 103 mg/dL
Hgb A1c MFr Bld: 5.2 % (ref 4.8–5.6)

## 2019-12-06 LAB — COMPREHENSIVE METABOLIC PANEL
ALT: 15 IU/L (ref 0–44)
AST: 25 IU/L (ref 0–40)
Albumin/Globulin Ratio: 2.3 — ABNORMAL HIGH (ref 1.2–2.2)
Albumin: 4.2 g/dL (ref 3.6–4.6)
Alkaline Phosphatase: 59 IU/L (ref 39–117)
BUN/Creatinine Ratio: 19 (ref 10–24)
BUN: 27 mg/dL (ref 8–27)
Bilirubin Total: 1.2 mg/dL (ref 0.0–1.2)
CO2: 19 mmol/L — ABNORMAL LOW (ref 20–29)
Calcium: 9.1 mg/dL (ref 8.6–10.2)
Chloride: 109 mmol/L — ABNORMAL HIGH (ref 96–106)
Creatinine, Ser: 1.44 mg/dL — ABNORMAL HIGH (ref 0.76–1.27)
GFR calc Af Amer: 51 mL/min/{1.73_m2} — ABNORMAL LOW (ref >59)
GFR calc non Af Amer: 44 mL/min/{1.73_m2} — ABNORMAL LOW (ref >59)
Globulin, Total: 1.8 g/dL (ref 1.5–4.5)
Glucose: 100 mg/dL — ABNORMAL HIGH (ref 65–99)
Potassium: 4.8 mmol/L (ref 3.5–5.2)
Sodium: 143 mmol/L (ref 134–144)
Total Protein: 6 g/dL (ref 6.0–8.5)

## 2019-12-06 LAB — LIPID PANEL
Chol/HDL Ratio: 1.9 ratio (ref 0.0–5.0)
Cholesterol, Total: 130 mg/dL (ref 100–199)
HDL: 69 mg/dL (ref >39)
LDL Chol Calc (NIH): 49 mg/dL (ref 0–99)
Triglycerides: 55 mg/dL (ref 0–149)
VLDL Cholesterol Cal: 12 mg/dL (ref 5–40)

## 2019-12-06 LAB — TSH: TSH: 2.29 u[IU]/mL (ref 0.450–4.500)

## 2019-12-06 LAB — VITAMIN D 25 HYDROXY (VIT D DEFICIENCY, FRACTURES): Vit D, 25-Hydroxy: 24.9 ng/mL — ABNORMAL LOW (ref 30.0–100.0)

## 2019-12-06 LAB — T4, FREE: Free T4: 1.28 ng/dL (ref 0.82–1.77)

## 2019-12-08 ENCOUNTER — Encounter: Payer: Self-pay | Admitting: Family Medicine

## 2019-12-08 ENCOUNTER — Ambulatory Visit (INDEPENDENT_AMBULATORY_CARE_PROVIDER_SITE_OTHER): Payer: Medicare Other | Admitting: Family Medicine

## 2019-12-08 ENCOUNTER — Other Ambulatory Visit: Payer: Self-pay

## 2019-12-08 VITALS — BP 140/84 | HR 76 | Temp 98.0°F | Resp 14 | Ht 72.0 in | Wt 156.1 lb

## 2019-12-08 DIAGNOSIS — N183 Chronic kidney disease, stage 3 unspecified: Secondary | ICD-10-CM

## 2019-12-08 DIAGNOSIS — E1159 Type 2 diabetes mellitus with other circulatory complications: Secondary | ICD-10-CM

## 2019-12-08 DIAGNOSIS — G250 Essential tremor: Secondary | ICD-10-CM

## 2019-12-08 DIAGNOSIS — Z79899 Other long term (current) drug therapy: Secondary | ICD-10-CM

## 2019-12-08 DIAGNOSIS — E559 Vitamin D deficiency, unspecified: Secondary | ICD-10-CM | POA: Diagnosis not present

## 2019-12-08 DIAGNOSIS — E1129 Type 2 diabetes mellitus with other diabetic kidney complication: Secondary | ICD-10-CM

## 2019-12-08 DIAGNOSIS — D594 Other nonautoimmune hemolytic anemias: Secondary | ICD-10-CM

## 2019-12-08 DIAGNOSIS — R809 Proteinuria, unspecified: Secondary | ICD-10-CM

## 2019-12-08 DIAGNOSIS — F411 Generalized anxiety disorder: Secondary | ICD-10-CM

## 2019-12-08 DIAGNOSIS — E1122 Type 2 diabetes mellitus with diabetic chronic kidney disease: Secondary | ICD-10-CM | POA: Diagnosis not present

## 2019-12-08 DIAGNOSIS — I152 Hypertension secondary to endocrine disorders: Secondary | ICD-10-CM

## 2019-12-08 DIAGNOSIS — E1169 Type 2 diabetes mellitus with other specified complication: Secondary | ICD-10-CM

## 2019-12-08 DIAGNOSIS — E860 Dehydration: Secondary | ICD-10-CM

## 2019-12-08 DIAGNOSIS — R6889 Other general symptoms and signs: Secondary | ICD-10-CM

## 2019-12-08 DIAGNOSIS — E782 Mixed hyperlipidemia: Secondary | ICD-10-CM

## 2019-12-08 DIAGNOSIS — R251 Tremor, unspecified: Secondary | ICD-10-CM

## 2019-12-08 DIAGNOSIS — L989 Disorder of the skin and subcutaneous tissue, unspecified: Secondary | ICD-10-CM

## 2019-12-08 DIAGNOSIS — Z7982 Long term (current) use of aspirin: Secondary | ICD-10-CM

## 2019-12-08 DIAGNOSIS — L988 Other specified disorders of the skin and subcutaneous tissue: Secondary | ICD-10-CM

## 2019-12-08 DIAGNOSIS — I10 Essential (primary) hypertension: Secondary | ICD-10-CM

## 2019-12-08 DIAGNOSIS — I872 Venous insufficiency (chronic) (peripheral): Secondary | ICD-10-CM

## 2019-12-08 HISTORY — DX: Dehydration: E86.0

## 2019-12-08 HISTORY — DX: Essential tremor: G25.0

## 2019-12-08 HISTORY — DX: Other general symptoms and signs: R68.89

## 2019-12-08 HISTORY — DX: Other specified disorders of the skin and subcutaneous tissue: L98.8

## 2019-12-08 HISTORY — DX: Other nonautoimmune hemolytic anemias: D59.4

## 2019-12-08 MED ORDER — ATORVASTATIN CALCIUM 10 MG PO TABS: ORAL_TABLET | ORAL | 0 refills | Status: DC

## 2019-12-08 MED ORDER — VITAMIN D (ERGOCALCIFEROL) 1.25 MG (50000 UNIT) PO CAPS: ORAL_CAPSULE | ORAL | 3 refills | Status: DC

## 2019-12-08 NOTE — Progress Notes (Signed)
Assessment and plan:  1. Type 2 diabetes mellitus with stage 3 chronic kidney disease, without long-term current use of insulin, unspecified whether stage 3a or 3b CKD ([place])   2. Mixed diabetic hyperlipidemia associated with type 2 diabetes mellitus ([place])   3. Microalbuminuria due to type 2 diabetes mellitus ([place])   4. Hypertension associated with diabetes ([place])   5. Dehydration- mild   6. Erosive pustular dermatosis of scalp   7. 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 Vitamin D deficiency   9. High risk medications (not anticoagulants) long-term use   10. Tremor of both hands-  both wiht rest and activity; acutely worse   11. Benign essential tremor-  thought to be related to anxiety   12. Decreased activity   13. Venous insufficiency of both lower extremities   14. GAD (generalized anxiety disorder)     Essential Tremor - Patient was evaluated by neurology Feb 2020- Dr Rexene Alberts - [place] notes reviewed again today - If patient's shaking worsens, he knows to let us know - It DOES appear that his sx are related to ANXIETY- as his anxiety is very high re: his derm problems of his scalp has had him upset lately - Patient will call if his shaking begins to affect his ability to eat or live life. - Will continue to monitor.  (( A/P from Dr Rexene Alberts 12/2018: In summary, Shiquan Mathieu is a very pleasant 86 y.o.-year old male with an underlying medical history of diabetes, hypertension, skin cancer, chronic kidney disease, hyperlipidemia, venous insufficiency, and history of orthostatic hypotension, who presents for evaluation of his hand tremors. He gives a long-standing history of hand tremors with recent exacerbation. On examination, he has a bilateral upper extremity postural and action tremor with a slight resting component.   - He has no signs of parkinsonism and is reassured in that regard.  He does endorse recent increase in stressors,, which would be a contributor to tremor exacerbation. We talked about tremor triggers at length today. We talked about potential symptomatic medications but I would not favor any prescription medications for him for fear of side effects. He has had some fluctuation in his blood sugar values recently. He would not be a good candidate for beta blocker. I would also not favor trying primidone for fear of sedation and balance issues and exacerbation of his kidney function.  -  He is advised to talk to about stress management and stress reduction, I get a sense, that he does not wish to try any medication for anxiety, we talked about this as well today. He is encouraged to stay well-hydrated. I did not suggest any new diagnostic testing from my end of things. I would be happy to see him back on an as-needed basis. I answered all their questions today and the patient and his wife were in agreement. Thank you very much for allowing me to participate in the care of this nice patient. If I can be of any further assistance to you please do not hesitate to call me at [phone].  Sincerely,   Star Age, MD, PhD))     GAD: new onset from Derm condition  - pt declines he needs meds/  Doesn't think he needs "help with his nerves" - declines counseling - d/c pt walking, prayer and other things to help him     Type 2 diabetes mellitus with stage 3 CKD  - A1c most  recently is 5.2, down from 6.4 prior. - Patient denies symptomatic low blood sugars.  - Given reduction in A1c, discontinue glimepiride. - Encouraged patient to continue to eat low-carb, healthfully. - Will re-check A1c in three months.  - Counseled patient on pathophysiology of disease and discussed various treatment options, which always includes dietary and lifestyle modification as first line.    - Importance of low carb, heart-healthy diet discussed with patient in addition to regular  aerobic exercise of 84min 5d/week or more.   - Check FBS and 2 hours after the biggest meal of your day.  Keep log and bring in next OV for my review.    - Also told patient if you ever feel poorly, please check your blood pressure and blood sugar, as one or the other could be the cause of your symptoms.  - Pt reminded about need for yearly eye and foot exams.  Told patient to make appt.for diabetic eye exam, CMAs here will do foot exams  - We will continue to monitor closely as treatment plan is changed.    Mixed diabetic hyperlipidemia associated with type 2 DM - FLP five days ago: Triglycerides = 55, up from 52 prior but WNL. LDL = 49, WNL. HDL = 69, improved from 49 prior.  - Cholesterol levels are at goal. - Decrease Lipitor to one tablet nightly.  - Ongoing prudent dietary changes such as low saturated & trans fat diets for hyperlipidemia and low carb diets for hypertriglyceridemia discussed with patient.    - Encouraged patient to follow AHA guidelines for regular exercise.  - We will continue to monitor.    Hypertension associated with diabetes - Blood pressure elevated on intake today, 210/98. - BP 140/82 on re-check. - Per patient, controlled at home.  - Patient will continue current treatment regimen.  - Counseled patient on pathophysiology of disease and discussed various treatment options, which always includes dietary and lifestyle modification as first line.   - Lifestyle changes such as dash and heart healthy diets and engaging in a regular exercise program discussed extensively with patient.   - Ambulatory blood pressure monitoring encouraged at least 3 times weekly.  Keep log and bring in every office visit.  Reminded patient that if they ever feel poorly in any way, to check their blood pressure and pulse.  - To help reduce LE swelling, encouraged patient to consume a diet low in salt, and exercise often.  Told patient to engage in physical activity daily  for 30-45 minutes.  - We will continue to monitor.    Lab Review - Reviewed recent lab work (12/03/2019) in depth with patient today.  All lab work within normal limits unless otherwise noted.    Mild Dehydration - Reviewed that patient's lab work shows he is dehydrated. - Advised patient to cut back on salt and drink more water. - Encouraged patient to drink sugar-free, salt-free, calorie-free drinks if needed.  - Will continue to monitor    Anemia likely secondary to Chemical Agent - Dermatologic Medications - Patient is being treated for erosive pustular dermatosis of scalp.   - Discussed that since his dermatological treatment on several medications, his CBC shows abnormal values.  Reviewed that patient's hemoglobin has reduced to 10.9 from 11.5 prior, and 12.7 prior to that.  - Per patient, for treatment of his scalp, dermatologist said he could have anemia and changes to his blood count.  Patient's dermatologist requested liver function test, triglycerides, and CBC.  - Discussed  that patient's dermatologist needs to monitor the patient's anemia closely.  Encouraged patient to call his dermatologist and let them know he had labs recently and shows some changes from prior which may be medication side effect from what they are giving him.   - Patient denies hemorrhoids.  To help rule-out whether the patient is bleeding from the rectum, stool cards provided today.  Told patient if heme positive, we will bring him in and do an anoscopy for further evaluation with possible refer back to his gastroenterologist if we cannot appreciate known cause for blood loss and distal rectum.  - Will continue to monitor and re-check as discussed.    Vitamin D Deficiency - 24.9 last check. - Begin once-weekly Vitamin D supplementation. - Discussed goal of maintenance in 40-60 range.  - Will continue to monitor and re-check as discussed.    Lifestyle & Preventative Health Maintenance -  Advised patient to continue working toward exercising to improve overall mental, physical, and emotional health.    - Reviewed the "spokes of the wheel" of mood and health management.  Stressed the importance of ongoing prudent habits, including regular exercise, appropriate sleep hygiene, healthful dietary habits, and prayer/meditation to relax.  - Encouraged patient to engage in daily physical activity as tolerated, especially a formal exercise routine.  Recommended that the patient eventually strive for at least 150 minutes of moderate cardiovascular activity per week according to guidelines established by the [facility].   - Healthy dietary habits encouraged, including low-carb, and high amounts of lean protein in diet.   - Patient should also consume adequate amounts of water, especially to reduce incidence of constipation.  - Health counseling performed.  All questions answered.   Education and routine counseling performed. Handouts provided.   Orders Placed This Encounter  Procedures  . VITAMIN D 25 Hydroxy (Vit-D Deficiency, Fractures)  . Magnesium  . Lipid panel  . Hemoglobin A1c  . Microalbumin / creatinine urine ratio    Meds ordered this encounter  Medications  . atorvastatin (LIPITOR) 10 MG tablet    Sig: Take 1 tablet every night before bed    Dispense:  90 tablet    Refill:  0  . Vitamin D, Ergocalciferol, (DRISDOL) 1.25 MG (50000 UNIT) CAPS capsule    Sig: Take one tablet wkly    Dispense:  12 capsule    Refill:  3    Medications Discontinued During This Encounter  Medication Reason  . Polyethylene Glycol 400 (BLINK TEARS OP) Error  . predniSONE (DELTASONE) 10 MG tablet Error  . glimepiride (AMARYL) 1 MG tablet Discontinued by provider  . atorvastatin (LIPITOR) 10 MG tablet      Return re-check FLP, Vit D, A1c 3 months ( we d/c glimepiride and decrease Lipitor added vitamin D), with OV 3-5 days later; Call your Derm doc re: anemia.   Anticipatory guidance and  routine counseling done re: condition, txmnt options and need for follow up. All questions of patient's were answered.   Gross side effects, risk and benefits, and alternatives of medications discussed with patient.  Patient is aware that all medications have potential side effects and we are unable to predict every sideeffect or drug-drug interaction that may occur.  Expresses verbal understanding and consents to current therapy plan and treatment regiment.  Please see AVS handed out to patient at the end of our visit for additional patient instructions/ counseling done pertaining to today's office visit.  Note:  This document was prepared using Dragon voice  recognition software and may include unintentional dictation errors.  This document serves as a record of services personally performed by Mellody Dance, DO. It was created on her behalf by Toni Amend, a trained medical scribe. The creation of this record is based on the scribe's personal observations and the provider's statements to them.   This case required medical decision making of at least moderate complexity. The above documentation has been reviewed to be accurate and was completed by Marjory Sneddon, D.O.    ----------------------------------------------------------------------------------------------------------------------  Subjective:   George Barnett, am serving as scribe for Dr. Mellody Dance.  CC:   George Barnett is a 86 y.o. male who presents to [facility] today for review and discussion of recent bloodwork that was done in addition to f/up on chronic conditions we are managing for pt.  1. All recent blood work that we ordered was reviewed with patient today.  Patient was counseled on all abnormalities and we discussed dietary and lifestyle changes that could help those values (also medications when appropriate).  Extensive health counseling performed and all patient's  concerns/ questions were addressed.  See labs below and also plan for more details of these abnormalities  - Tremor He feels his shaking has worsened a bit.  Notes "I can't even write my name or read my own writing, it's so bad."   Per patient, went to neurologist a year or so ago for evaluation of his shaking.  Was told it was not Parkinson's, and the risks of medicines would be greater than the benefit.  - Dermatological Health Notes he's had his skin concern for over a year.  It causes him significant discomfort and interference with life, noting the area itches and bothers him constantly.  Says "they say it's rare, and 80% have treatment that works, while 20% have trouble."  He is being treated with several different medications, such as prednisone, dapsone, and doxycycline.  He is having his blood monitored and was told he was a little anemic.  Confirms that his dermatologist has been keeping an eye on his blood work.  Patient notes he's been on dapsone for three months.  - Vitamin D Deficiency Per patient, "I didn't know I was supposed to take the Vitamin D."  HPI:  Hypertension:  -  His blood pressure at home has been running: 120-something over 60-something at home.  Notes "every time I go to a doctor, [a high reading] happens."  Says his highest reading has been 140-something.  Confirms that he can lie flat at night; does not have to prop himself up at night.  Denies difficulty lying flat.  States "I have a round pillow that keeps my head off the bed [because my scalp] bothers me a lot."  Has swelling in his feet sometimes in the morning, but this usually this gets worse throughout the day.  Denies dietary changes such as increased salt in his diet.  Confirms moving around less because it's the winter "and I can't get outside."   Denies chest pain, tightness or excessive sweatiness with activities.  States he has had some night sweats.  - Patient reports good compliance with  medication and/or lifestyle modification  - His denies acute concerns or problems related to treatment plan  - He denies new onset of: chest pain, exercise intolerance, shortness of breath, dizziness, visual changes, headache, lower extremity swelling or claudication.   Last 3 blood pressure readings in our office are as follows:  BP Readings from Last 3 Encounters:  12/08/19 140/84  08/19/19 (!) 160/80  06/02/19 122/78   Filed Weights   12/08/19 1032  Weight: 156 lb 1.6 oz (70.8 kg)    HPI:   Diabetes Mellitus:  Home glucose readings:  Some 90's, some 120's, "and then I had a little block where it was 140, even up to 180, and then it will drop back down and I can't account for that."  Says "I'm taking a lot of medications.  At one point I was taking prednisone, doxycycline, and dapsone" all at once.  Notes it "hasn't been that long ago since I was taking prednisone."  Notes 90's are the lowest his blood sugar goes, and he denies feeling dizzy or shaky during these times.  He's been trying to eat more fruits and vegetables due to a recent issue with constipation.   - Patient reports good compliance with therapy plan: medication and/or lifestyle modification  - His denies acute concerns or problems related to treatment plan  - He denies new concerns.  Denies polyuria/polydipsia, hypo/ hyperglycemia symptoms.  Denies new onset of: chest pain, exercise intolerance, shortness of breath, dizziness, visual changes, headache, lower extremity swelling or claudication.   Last A1C in the office was:  Lab Results  Component Value Date   HGBA1C 5.2 12/03/2019   HGBA1C 6.4 (H) 05/14/2019   HGBA1C 7.9 (H) 02/01/2019   Lab Results  Component Value Date   MICROALBUR 10 07/15/2017   LDLCALC 49 12/03/2019   CREATININE 1.44 (H) 12/03/2019   BP Readings from Last 3 Encounters:  12/08/19 140/84  08/19/19 (!) 160/80  06/02/19 122/78   Wt Readings from Last 3 Encounters:  12/08/19 156 lb  1.6 oz (70.8 kg)  11/25/19 155 lb (70.3 kg)  08/19/19 158 lb 3.2 oz (71.8 kg)         Wt Readings from Last 3 Encounters:  12/08/19 156 lb 1.6 oz (70.8 kg)  11/25/19 155 lb (70.3 kg)  08/19/19 158 lb 3.2 oz (71.8 kg)   BP Readings from Last 3 Encounters:  12/08/19 140/84  08/19/19 (!) 160/80  06/02/19 122/78   Pulse Readings from Last 3 Encounters:  12/08/19 76  08/19/19 70  06/02/19 62   BMI Readings from Last 3 Encounters:  12/08/19 21.17 kg/m  11/25/19 21.02 kg/m  08/19/19 21.46 kg/m     Patient Care Team    Relationship Specialty Notifications Start End  Mellody Dance, DO PCP - General Family Medicine  09/01/17   Jerrell Belfast, MD Consulting Physician Otolaryngology  03/16/18   Jarome Matin, MD Consulting Physician Dermatology  03/16/18   Franchot Gallo, MD Consulting Physician Urology  07/27/18   Jamse Arn, MD Consulting Physician Physical Medicine and Rehabilitation  07/27/18   Deneise Lever, MD Consulting Physician Pulmonary Disease  07/27/18   Park Liter, MD Consulting Physician Cardiology  11/27/18   Garry Heater, MD Referring Physician Dermatology  06/02/19   Star Age, MD Attending Physician Neurology  12/08/19    Comment: seen for TREMORS--> last seen 12/2018  Marygrace Drought, MD Consulting Physician Ophthalmology  12/08/19     Full medical history updated and reviewed in the office today  Patient Active Problem List   Diagnosis Date Noted  . Hypertension associated with diabetes ([place]) 08/05/2017  . Mixed diabetic hyperlipidemia associated with type 2 diabetes mellitus ([place]) 08/05/2017  . Microalbuminuria due to type 2 diabetes mellitus ([place]) 08/05/2017  . Type 2 diabetes mellitus with  stage 3 chronic kidney disease, without long-term current use of insulin ([place]) 05/05/2017  . GAD (generalized anxiety disorder) 12/10/2019  . 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 Erosive  pustular dermatosis of scalp 12/08/2019  . Dehydration- mild 12/08/2019  . Benign essential tremor 12/08/2019  . Vitamin D deficiency 12/08/2019  . Decreased activity 12/08/2019  . Recurrent right inguinal hernia 05/18/2019  . Atypical chest pain 05/07/2019  . High risk medication use 01/15/2019  . Venous insufficiency of both lower extremities 11/27/2018  . Tremor of both hands-  both wiht rest and activity; acutely worse 11/27/2018  . Orthostatic hypotension 11/05/2018  . Cancer ([place]) 07/27/2018  . Folliculitis- scalp; txed by Derm- Dr Jarome Matin 07/27/2018  . Bilateral lower extremity edema 11/17/2017  . RLS (restless legs syndrome) 06/17/2017  . GERD (gastroesophageal reflux disease) 05/05/2017  . Glossodynia 05/05/2017  . Costochondritis 05/05/2017  . Stage 3 chronic kidney disease ([place]) 04/22/2017  . Abdominal pain 03/25/2017  . CAD (coronary artery disease) 03/25/2017  . Diabetes mellitus without complication ([place]) 60/45/4098  . Snoring 03/25/2017  . Urinary retention due to benign prostatic hyperplasia 01/25/2015  . Primary osteoarthritis of right shoulder 01/24/2015  . Edema, peripheral 11/22/2014    Past Medical History:  Diagnosis Date  . Cancer [facility])    SKIN CANCER   . Cataract   . Diabetes mellitus without complication ([place])   . Elbow effusion, left   . Frequency of urination   . GERD (gastroesophageal reflux disease)   . Heart attack ([place])   . Heart disease   . Hypertension   . Primary osteoarthritis of right shoulder 01/24/2015  . Prostate enlargement   . Tremors of nervous system   . Urinary retention due to benign prostatic hyperplasia 01/25/2015    Past Surgical History:  Procedure Laterality Date  . CARDIAC CATHETERIZATION     1999  . cardio stint    . HERNIA REPAIR     BILAT   . INGUINAL HERNIA REPAIR  05/18/2019  . INGUINAL HERNIA REPAIR Right 05/18/2019   Procedure: REPAIR RIGHT INGUINAL HERNIA WITH MESH;  Surgeon: Erroll Luna, MD;   Location: [facility];  Service: General;  Laterality: Right;  . MOHS SURGERY     BILAT EARS   . PROSTATE BIOPSY    . TOTAL SHOULDER ARTHROPLASTY Right 01/24/2015   Procedure: RIGHT TOTAL SHOULDER ARTHROPLASTY;  Surgeon: Marchia Bond, MD;  Location: [facility];  Service: Orthopedics;  Laterality: Right;    Social History   Tobacco Use  . Smoking status: Never Smoker  . Smokeless tobacco: Never Used  Substance Use Topics  . Alcohol use: No    Family Hx: Family History  Problem Relation Age of Onset  . Hypertension Brother      Medications: Current Outpatient Medications  Medication Sig Dispense Refill  . acitretin (SORIATANE) 10 MG capsule Take by mouth.    Marland Kitchen aspirin 81 MG tablet Take 81 mg by mouth every evening.     Marland Kitchen atorvastatin (LIPITOR) 10 MG tablet Take 1 tablet every night before bed 90 tablet 0  . clobetasol ointment (TEMOVATE) 0.05 %     . Cobalamine Combinations (J-19 + FOLIC ACID PO) Take 1 capsule by mouth every evening.     . dapsone 25 MG tablet Take 3 tablets by mouth daily.    Marland Kitchen losartan (COZAAR) 25 MG tablet Take 1 tablet (25 mg total) by mouth daily. 90 tablet 1  . metFORMIN (GLUCOPHAGE)  500 MG tablet Take 1 tablet (500 mg total) by mouth 2 (two) times daily with a meal. 120 tablet 1  . Polyethyl Glycol-Propyl Glycol (SYSTANE OP) Place 1 drop into both eyes daily.    . polyethylene glycol (MIRALAX / GLYCOLAX) packet Take 17 g by mouth daily as needed for mild constipation.     . Probiotic Product (PROBIOTIC DAILY PO) Take 2 capsules by mouth daily with lunch.    . silver sulfADIAZINE (SILVADENE) 1 % cream Apply topically 2 (two) times daily.    . Vitamin D, Ergocalciferol, (DRISDOL) 1.25 MG (50000 UNIT) CAPS capsule Take one tablet wkly 12 capsule 3   No current facility-administered medications for this visit.    Allergies:  Allergies  Allergen Reactions  . Exenatide Nausea And Vomiting  . Meperidine And Related Other (See Comments)    Red streak, that happened  once while having an IV  . Nadolol Other (See Comments)    Numbness in fingers  . Sitagliptin Phosphate [Sitagliptin] Other (See Comments)    Weight loss      Review of Systems: General:   No F/C, wt loss Pulm:   No DIB, SOB, pleuritic chest pain Card:  No CP, palpitations Abd:  No n/v/d or pain Ext:  No inc edema from baseline  Objective:  Blood pressure 140/84, pulse 76, temperature 98 F (36.7 C), temperature source Oral, resp. rate 14, height 6' (1.829 m), weight 156 lb 1.6 oz (70.8 kg), SpO2 97 %. Body mass index is 21.17 kg/m. Gen:   Well NAD, A and O *3 HEENT:    [place]/AT, EOMI,  MMM Lungs:   Normal work of breathing. CTA B/L, no Wh, rhonchi Heart:   RRR, S1, S2 WNL's, no MRG Abd:   No gross distention Exts:    1+ pitting edema BLE.  warm, pink,  Brisk capillary refill, warm and well perfused.  Psych:    No HI/SI, judgement and insight good, Euthymic mood. Full Affect.   Recent Results (from the past 2160 hour(s))  VITAMIN D 25 Hydroxy (Vit-D Deficiency, Fractures)     Status: Abnormal   Collection Time: 12/03/19  8:37 AM  Result Value Ref Range   Vit D, 25-Hydroxy 24.9 (L) 30.0 - 100.0 ng/mL    Comment: Vitamin D deficiency has been defined by the [place] practice guideline as a level of serum 25-OH vitamin D less than 20 ng/mL (1,2). The Endocrine Society went on to further define vitamin D insufficiency as a level between 21 and 29 ng/mL (2). 1. IOM (Institute of Medicine). 2010. Dietary reference    intakes for calcium and D. [place]: The    [employer]. 2. Holick MF, Binkley [place], Bischoff-Ferrari HA, et al.    Evaluation, treatment, and prevention of vitamin D    deficiency: an Endocrine Society clinical practice    guideline. JCEM. 2011 Jul; 96(7):1911-30.   Lipid panel     Status: None   Collection Time: 12/03/19  8:37 AM  Result Value Ref Range   Cholesterol, Total 130 100 - 199 mg/dL    Triglycerides 55 0 - 149 mg/dL   HDL 69 >39 mg/dL   VLDL Cholesterol Cal 12 5 - 40 mg/dL   LDL Chol Calc (NIH) 49 0 - 99 mg/dL   Chol/HDL Ratio 1.9 0.0 - 5.0 ratio    Comment:  T. Chol/HDL Ratio                                             Men  Women                               1/2 Avg.Risk  3.4    3.3                                   Avg.Risk  5.0    4.4                                2X Avg.Risk  9.6    7.1                                3X Avg.Risk 23.4   11.0   Hemoglobin A1c     Status: None   Collection Time: 12/03/19  8:37 AM  Result Value Ref Range   Hgb A1c MFr Bld 5.2 4.8 - 5.6 %    Comment:          Prediabetes: 5.7 - 6.4          Diabetes: >6.4          Glycemic control for adults with diabetes: <7.0    Est. average glucose Bld gHb Est-mCnc 103 mg/dL  T4, free     Status: None   Collection Time: 12/03/19  8:37 AM  Result Value Ref Range   Free T4 1.28 0.82 - 1.77 ng/dL  TSH     Status: None   Collection Time: 12/03/19  8:37 AM  Result Value Ref Range   TSH 2.290 0.450 - 4.500 uIU/mL  CMP (comprehensive metabolic panel)     Status: Abnormal   Collection Time: 12/03/19  8:37 AM  Result Value Ref Range   Glucose 100 (H) 65 - 99 mg/dL   BUN 27 8 - 27 mg/dL   Creatinine, Ser 1.44 (H) 0.76 - 1.27 mg/dL   GFR calc non Af Amer 44 (L) >59 mL/min/1.73   GFR calc Af Amer 51 (L) >59 mL/min/1.73   BUN/Creatinine Ratio 19 10 - 24   Sodium 143 134 - 144 mmol/L   Potassium 4.8 3.5 - 5.2 mmol/L   Chloride 109 (H) 96 - 106 mmol/L   CO2 19 (L) 20 - 29 mmol/L   Calcium 9.1 8.6 - 10.2 mg/dL   Total Protein 6.0 6.0 - 8.5 g/dL   Albumin 4.2 3.6 - 4.6 g/dL   Globulin, Total 1.8 1.5 - 4.5 g/dL   Albumin/Globulin Ratio 2.3 (H) 1.2 - 2.2   Bilirubin Total 1.2 0.0 - 1.2 mg/dL   Alkaline Phosphatase 59 39 - 117 IU/L   AST 25 0 - 40 IU/L   ALT 15 0 - 44 IU/L  CBC w/Diff     Status: Abnormal   Collection Time: 12/03/19  8:37 AM  Result Value Ref  Range   WBC 7.6 3.4 - 10.8 x10E3/uL   RBC 3.04 (L) 4.14 - 5.80 x10E6/uL   Hemoglobin 10.9 (L) 13.0 - 17.7 g/dL   Hematocrit 32.2 (L) 37.5 -  51.0 %   MCV 106 (H) 79 - 97 fL   MCH 35.9 (H) 26.6 - 33.0 pg   MCHC 33.9 31.5 - 35.7 g/dL   RDW 12.3 11.6 - 15.4 %   Platelets 128 (L) 150 - 450 x10E3/uL   Neutrophils 52 Not Estab. %   Lymphs 36 Not Estab. %   Monocytes 9 Not Estab. %   Eos 1 Not Estab. %   Basos 1 Not Estab. %   Neutrophils Absolute 4.0 1.4 - 7.0 x10E3/uL   Lymphocytes Absolute 2.7 0.7 - 3.1 x10E3/uL   Monocytes Absolute 0.7 0.1 - 0.9 x10E3/uL   EOS (ABSOLUTE) 0.1 0.0 - 0.4 x10E3/uL   Basophils Absolute 0.1 0.0 - 0.2 x10E3/uL   Immature Granulocytes 1 Not Estab. %   Immature Grans (Abs) 0.0 0.0 - 0.1 x10E3/uL

## 2019-12-08 NOTE — Patient Instructions (Addendum)
  Take one tablet of Lipitor/Atorvastatin nightly; do not take one and a half tablets. Discontinue your glimepiride. Take your once weekly Vitamin D.  Call your dermatologist regarding your recent CBC blood counts.  I believe this is likely due to medication side effect from your scalp treatments.  Please, please let us know if the dermatologist does not recommend further treatment for this and thinks it is from something else.  We have not changed anything else however those meds have been changed by them.  Also, please do the stool cards-just to make sure you do not losing blood from the rectum.

## 2019-12-10 ENCOUNTER — Encounter: Payer: Self-pay | Admitting: Family Medicine

## 2019-12-10 DIAGNOSIS — F411 Generalized anxiety disorder: Secondary | ICD-10-CM

## 2019-12-10 HISTORY — DX: Generalized anxiety disorder: F41.1

## 2019-12-10 NOTE — Telephone Encounter (Signed)
After review of my prior office visit note, it appears he is a little confused on what we discussed.  This does not surprise me as he was /is very anxious lately due to his scalp issues.   -We told him to contact his dermatologist to let them know about his recent labs and ask if anything further needs to be changed with his scalp treatment regiment.

## 2019-12-13 ENCOUNTER — Other Ambulatory Visit: Payer: Self-pay

## 2019-12-13 ENCOUNTER — Other Ambulatory Visit: Payer: Medicare Other

## 2019-12-13 DIAGNOSIS — Z1211 Encounter for screening for malignant neoplasm of colon: Secondary | ICD-10-CM

## 2019-12-13 LAB — FECAL OCCULT BLOOD, IMMUNOCHEMICAL

## 2019-12-17 ENCOUNTER — Encounter: Payer: Self-pay | Admitting: Family Medicine

## 2019-12-21 ENCOUNTER — Ambulatory Visit: Payer: Medicare Other | Attending: Internal Medicine

## 2019-12-21 DIAGNOSIS — Z23 Encounter for immunization: Secondary | ICD-10-CM | POA: Insufficient documentation

## 2019-12-21 NOTE — Progress Notes (Signed)
   Covid-[facility] Vaccination [facility]  Name:  George Barnett    MRN: [id number] DOB: 11-02-1933  12/21/2019  Mr. George Barnett was observed post Covid-19 immunization for 15 minutes without incidence. He was provided with Vaccine Information Sheet and instruction to access the V-Safe system.   Mr. George Barnett was instructed to call 911 with any severe reactions post vaccine: Marland Kitchen Difficulty breathing  . Swelling of your face and throat  . A fast heartbeat  . A bad rash all over your body  . Dizziness and weakness    Immunizations Administered    Name Date Dose VIS Date Route   Pfizer COVID-19 Vaccine 12/21/2019 10:25 AM 0.3 mL 10/29/2019 Intramuscular   Manufacturer: [place]   Lot: [id number]   [place]: [id number]

## 2019-12-24 ENCOUNTER — Encounter: Payer: Self-pay | Admitting: Family Medicine

## 2019-12-30 DIAGNOSIS — L570 Actinic keratosis: Secondary | ICD-10-CM | POA: Diagnosis not present

## 2019-12-30 DIAGNOSIS — D485 Neoplasm of uncertain behavior of skin: Secondary | ICD-10-CM | POA: Diagnosis not present

## 2019-12-30 DIAGNOSIS — D0461 Carcinoma in situ of skin of right upper limb, including shoulder: Secondary | ICD-10-CM | POA: Diagnosis not present

## 2019-12-30 DIAGNOSIS — L988 Other specified disorders of the skin and subcutaneous tissue: Secondary | ICD-10-CM | POA: Diagnosis not present

## 2019-12-30 DIAGNOSIS — C44622 Squamous cell carcinoma of skin of right upper limb, including shoulder: Secondary | ICD-10-CM | POA: Diagnosis not present

## 2019-12-30 DIAGNOSIS — L820 Inflamed seborrheic keratosis: Secondary | ICD-10-CM | POA: Diagnosis not present

## 2020-01-12 ENCOUNTER — Ambulatory Visit (INDEPENDENT_AMBULATORY_CARE_PROVIDER_SITE_OTHER): Payer: Medicare Other | Admitting: Family Medicine

## 2020-01-12 ENCOUNTER — Encounter: Payer: Self-pay | Admitting: Family Medicine

## 2020-01-12 ENCOUNTER — Other Ambulatory Visit: Payer: Self-pay

## 2020-01-12 VITALS — BP 139/76 | HR 70 | Ht 72.0 in | Wt 154.0 lb

## 2020-01-12 DIAGNOSIS — E1129 Type 2 diabetes mellitus with other diabetic kidney complication: Secondary | ICD-10-CM | POA: Diagnosis not present

## 2020-01-12 DIAGNOSIS — E1159 Type 2 diabetes mellitus with other circulatory complications: Secondary | ICD-10-CM

## 2020-01-12 DIAGNOSIS — G8929 Other chronic pain: Secondary | ICD-10-CM

## 2020-01-12 DIAGNOSIS — Z79899 Other long term (current) drug therapy: Secondary | ICD-10-CM

## 2020-01-12 DIAGNOSIS — E782 Mixed hyperlipidemia: Secondary | ICD-10-CM

## 2020-01-12 DIAGNOSIS — G250 Essential tremor: Secondary | ICD-10-CM

## 2020-01-12 DIAGNOSIS — F4329 Adjustment disorder with other symptoms: Secondary | ICD-10-CM

## 2020-01-12 DIAGNOSIS — K5900 Constipation, unspecified: Secondary | ICD-10-CM | POA: Diagnosis not present

## 2020-01-12 DIAGNOSIS — E1169 Type 2 diabetes mellitus with other specified complication: Secondary | ICD-10-CM

## 2020-01-12 DIAGNOSIS — L989 Disorder of the skin and subcutaneous tissue, unspecified: Secondary | ICD-10-CM | POA: Diagnosis not present

## 2020-01-12 DIAGNOSIS — D594 Other nonautoimmune hemolytic anemias: Secondary | ICD-10-CM

## 2020-01-12 DIAGNOSIS — E559 Vitamin D deficiency, unspecified: Secondary | ICD-10-CM

## 2020-01-12 DIAGNOSIS — I10 Essential (primary) hypertension: Secondary | ICD-10-CM

## 2020-01-12 DIAGNOSIS — E1122 Type 2 diabetes mellitus with diabetic chronic kidney disease: Secondary | ICD-10-CM

## 2020-01-12 DIAGNOSIS — E109 Type 1 diabetes mellitus without complications: Secondary | ICD-10-CM | POA: Insufficient documentation

## 2020-01-12 DIAGNOSIS — F411 Generalized anxiety disorder: Secondary | ICD-10-CM | POA: Diagnosis not present

## 2020-01-12 DIAGNOSIS — L988 Other specified disorders of the skin and subcutaneous tissue: Secondary | ICD-10-CM

## 2020-01-12 DIAGNOSIS — R809 Proteinuria, unspecified: Secondary | ICD-10-CM

## 2020-01-12 DIAGNOSIS — K862 Cyst of pancreas: Secondary | ICD-10-CM | POA: Diagnosis not present

## 2020-01-12 DIAGNOSIS — N183 Chronic kidney disease, stage 3 unspecified: Secondary | ICD-10-CM

## 2020-01-12 DIAGNOSIS — R519 Headache, unspecified: Secondary | ICD-10-CM

## 2020-01-12 HISTORY — DX: Headache, unspecified: R51.9

## 2020-01-12 HISTORY — DX: Other chronic pain: G89.29

## 2020-01-12 HISTORY — DX: Type 1 diabetes mellitus without complications: E10.9

## 2020-01-12 HISTORY — DX: Adjustment disorder with other symptoms: F43.29

## 2020-01-12 MED ORDER — GLIMEPIRIDE 1 MG PO TABS: 0.5000 mg | ORAL_TABLET | Freq: Every day | ORAL | 0 refills | Status: DC

## 2020-01-12 NOTE — Progress Notes (Signed)
Telehealth office visit note for Mellody Dance, D.O- at Primary Care at [facility]   I connected with current patient today and verified that I am speaking with the correct person using two identifiers.   . Location of the patient: Home . Location of the provider: Office Only the patient (+/- their family members at pt's discretion) and myself were participating in the encounter - This visit type was conducted due to national recommendations for restrictions regarding the COVID-19 Pandemic (e.g. social distancing) in an effort to limit this patient's exposure and mitigate transmission in our community.  This format is felt to be most appropriate for this patient at this time.   - No physical exam could be performed with this format, beyond that communicated to [place] by the patient/ family members as noted.   - Additionally my office staff/ schedulers discussed with the patient that there may be a monetary charge related to this service, depending on their medical insurance.   The patient expressed understanding, and agreed to proceed.      History of Present Illness: No chief complaint on file.    I, Toni Amend, am serving as scribe for [place] that the tremors in his hands have increased but he is unsure why.  He believes it may be due to his increased anxiety associated with his dermatological concerns.   -Of note he was seen by Dr. Ermalene Searing are of neurology.  See my last office visit note for details of this.  Neuro felt acute worsening due to stressors,   - Skin Concerns on Scalp, Managed by Specialist He went to [facility] to get a second opinion about his scalp.  Notes "he pretty much went along with the doctor at [facility] "It made me feel better in a way, knowing that it's gonna be a while and there's no cure."  Says he has mixed emotions.  Says he has been under a lot of stress and "it's the worst thing I have ever been through in my life."   Notes "it'll get better, and then it gets worse, and I don't know if I wake up in the morning if it will be better, the same, or worse."  He takes two tablets of dapsone daily.  This was recently decreased by his dermatologist due to worsening anemia most recently down into the tens.  Says the pain on his head is "like bees stinging you in the scalp."  Says the slightest touch to the area causes pain, especially when he tries to shampoo his head or apply ointment.  When everything first started, notes "he gave me some kind of pill that didn't help that much."     HPI:   Diabetes Mellitus:  Lab Results  Component Value Date   HGBA1C 5.2 12/03/2019   HGBA1C 6.4 (H) 05/14/2019   HGBA1C 7.9 (H) 02/01/2019    HPI from OV 03/03/2019:   Diabetes mellitus: We increased his metformin last OV, this was because prior we had taken him off the Amaryl due to fears of low sugars and patient's age (as well as patient having history of dizziness due to orthostatic hypotension for many yrs now etc  --->   Now on 1,000 BID.   Tol well.  His blood sugars are much better controlled, but now he had a bump in his serum creatinine  - patient's A1c has been very well controlled over the past couple of  years with the lowest A1c being 6.1 just 3 months ago and his highest being 7.0 since he has been seeing me.   During this time, patient was taking glimepiride 1 mg orally with breakfast and 500 metformin in the morning and 500 in the evening.  Since on November 27, 2018 his A1c was 6.1, we decided to DC his glimepiride at that time-January 10.      Then patient was seen on 01/15/2019 for acute office visit due to concerns that his blood sugars were going up.  At that time we increased metformin in the evening to 1000 every afternoon and still 500 every morning.   After only 2 weeks on increased dose of metformin on 02/01/2019 his A1c jumped to 7.9.      Chronic kidney disease:    However, with the metformin change,  his serum creatinine did go up from the 1.3 is up to 1.78 and GFR in the 40s to now 34.   5 years ago on 01/12/2015 patient serum creatinine at that time was 1.46.   Also urine microalbumin to creatinine ratio was 30-300when checked 8\28\18 this is what prompted me to start his losartan at low dose.     CURRENT DM HPI: -Last office visit on 12/08/2019, patient was seen and A1c a couple days prior was found to be at 5.2.  Hence last office visit we [place] his glimepiride especially since he was having some low sugar episodes as well.  Most recently, patient sent does not a message as below and he is here to discuss these concerns with me today.  - Patient Message on 12/24/2019 Chesley Noon, I want to let Dr. Raliegh Scarlet know that my glucose levels are moving in the wrong direction. I think it was last year that we stopped the Glimepiride and increased the Metformin to 4 tablets a day. Soon changed Metformin back to 2 tablets and resumed Glimepiride tablets because the results then were similar to results now. Thanks for your help.  Glucose levels after stopping Glimepiride: 1/21   131              1/26   165              1/31   182              2/5     211     1/22   124              1/27   170              2/1     194 1/23   149              1/28   193              2/2     190 1/24   154              1/29   185              2/3     184     1/25   166              1/30   222              2/4     226   Home glucose readings: Reports fasting sugars of 226, 211, 200, 232, 209, 223, 187, 237, 223, 270, 276, 284, 306, 290, 306, 331, 383,  313, 337.   - Patient reports good compliance with therapy plan: medication and/or lifestyle modification  - His denies acute concerns or problems related to treatment plan other than high BS  - He denies new concerns.  Denies polyuria/polydipsia, hypo/ hyperglycemia symptoms.  Denies new onset of CV sx, or new vision problems.     Does complain of worsening tremors which  she has had for some time and recently seen by neuro- Dr Rexene Alberts, thought to be related to his stress levels.  Last A1C in the office was:  Lab Results  Component Value Date   HGBA1C 5.2 12/03/2019   HGBA1C 6.4 (H) 05/14/2019   HGBA1C 7.9 (H) 02/01/2019   Lab Results  Component Value Date   MICROALBUR 10 07/15/2017   LDLCALC 49 12/03/2019   CREATININE 1.44 (H) 12/03/2019   BP Readings from Last 3 Encounters:  01/12/20 139/76  12/08/19 140/84  08/19/19 (!) 160/80   Wt Readings from Last 3 Encounters:  01/12/20 154 lb (69.9 kg)  12/08/19 156 lb 1.6 oz (70.8 kg)  11/25/19 155 lb (70.3 kg)    Depression screen [facility] 2/9 12/08/2019 11/25/2019 03/03/2019 01/15/2019 11/27/2018  Decreased Interest 0 2 1 0 0  Down, Depressed, Hopeless 0 0 1 0 0  PHQ - 2 Score 0 2 2 0 0  Altered sleeping 0 1 0 0 0  Tired, decreased energy 0 0 0 0 0  Change in appetite 0 0 0 0 0  Feeling bad or failure about yourself  0 0 0 0 0  Trouble concentrating 0 0 0 0 0  Moving slowly or fidgety/restless 0 0 0 0 0  Suicidal thoughts 0 0 0 0 0  PHQ-9 Score 0 3 2 0 0  Difficult doing work/chores - Not difficult at all Not difficult at all Not difficult at all Not difficult at all  Some recent data might be hidden       Impression and Recommendations:    1. 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 Type 2 diabetes mellitus with stage 3 chronic kidney disease, without long-term current use of insulin, unspecified whether stage 3a or 3b CKD ([place])   3. Anemia due to chemical agent (HCC)-dermatologic medications most likely   4. Microalbuminuria due to type 2 diabetes mellitus ([place])   5. Hypertension associated with diabetes ([place])   6. Mixed diabetic hyperlipidemia associated with type 2 diabetes mellitus ([place])   7. High risk medications (not anticoagulants) long-term use   8. Vitamin D deficiency   9. Erosive pustular dermatosis of scalp   10. GAD (generalized anxiety disorder)   11. Stress and adjustment reaction     12. Benign essential tremor-  thought to be related to anxiety   13. Chronic head/ "scalp" pain     1. 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 Type 2 diabetes mellitus with stage 3 chronic kidney disease, without long-term current use of insulin, unspecified whether stage 3a or 3b CKD ([place]) - See below - Hemoglobin A1c; Future - Comprehensive metabolic panel; Future  3. Anemia due to chemical agent (HCC)-dermatologic medications most likely -Dapsone recently decreased by dermatology from 3 tablets daily to 2.  Will recheck CBC - CBC with Differential/Platelet; Future  4. Microalbuminuria due to type 2 diabetes mellitus ([place]) -  On ARB; recheck near future - Hemoglobin A1c; Future - Microalbumin / creatinine urine ratio; Future  5. Hypertension associated with diabetes ([place]) - Bp at goal  - remains on low  dose ARB  - Hemoglobin A1c; Future  6. Mixed diabetic hyperlipidemia associated with type 2 diabetes mellitus (HCC) -LDL at goal approximately 1 month ago at 49 - Hemoglobin A1c; Future  7. High risk medications (not anticoagulants) long-term use -Due to side effects of dapsone causing anemia, will recheck CBC in the future. - Hemoglobin A1c; Future - Magnesium; Future - Vitamin B12; Future - VITAMIN D 25 Hydroxy (Vit-D Deficiency, Fractures); Future - Microalbumin / creatinine urine ratio; Future - CBC with Differential/Platelet; Future - Comprehensive metabolic panel; Future  8. Vitamin D deficiency We will check levels near future.  Last checked in January was too low and patient was asked to increase supplementation. - VITAMIN D 25 Hydroxy (Vit-D Deficiency, Fractures); Future  9. Erosive pustular dermatosis of scalp -See below  10. GAD (generalized anxiety disorder) 11. Stress and adjustment reaction -  See below  12. Benign essential tremor-  thought to be related to anxiety -See below - Magnesium; Future - Vitamin B12; Future  13. Chronic head/  "scalp" pain -Encouraged patient to speak with his dermatologist about better management of his discomfort.    Brittle Diabetes Mellitus   - with Stage 3 CKD  - On 11/27/2018, A1c went down to 6.1. At that time, patient was taking metformin 500 mg twice daily - Discontinued glimepiride at that time  - On 02/01/2019, A1c went up to 7.9. - When we doubled metformin from 500 to 1000 BID, serum creatinine jumped from 1.36 to 1.78.    - We put his Metformin back down to 500 twice daily and glimepiride 1 mg daily  - On 05/14/2019, A1c went down to 6.4.  - One month ago, 12/08/2019, A1c went down to 5.2.  At that time patient did have some mild hypoglycemic symptoms.  - Reviewed patient's trends of A1c during appointment. - Extensive education provided to patient today and all questions answered. - Discussed that goal A1c is 7.5-8.0, but an A1c of 5.1 is dangerously low.  -Patient tells me today he just cannot get it out of his mind that his A1c should be less than 7, thus he tells me he becomes very concerned when that happens  - Discussed adjustments to patient's treatment plan today, including changing meds entirely such as adding insulin to patient's plan. - Per patient, would like to try the Amaryl again. - Per patient, he experienced low blood sugars on the Amaryl, but states "it was very rare" and he would rather go back on that then try a new medicine. - Discussed critical need to monitor for symptoms of low blood sugar, and increased risk associated with glimepiride of this.   Patient states he understands but still wishes to go on the medicine. - Advised patient to restart glimepiride but only at one half-tablet of 1 mg (0.5 mg) glimepiride in the morning.  - Discussed goal of fasting sugars around 120- 160. - If patient's A1c cannot be appropriately managed on this plan, will need to change treatment plan altogether and change medications.  - Re-check A1c in 2 months.  Goal A1c of  7.2 to 8.0. - Last serum creatinine 1.44, one month ago.  - Last urine micro completely normal; re-check near future.  -Patient will need A1c checks every 3 months for the next 6 months to ensure stability after changes in meds made today  - Counseled patient on pathophysiology of disease and discussed various treatment options, which always includes dietary and lifestyle modification as first line.    -  Importance of low carb, heart-healthy diet discussed with patient in addition to walking to a goal of 54min 5d/week or more.   - Continue to check FBS and 2 hours after the biggest meal of your day.  Keep log and bring in next OV for my review.     - Also told patient if you ever feel poorly, please check your blood pressure and blood sugar, as one or the other could be the cause of your symptoms.  - Pt reminded about need for yearly eye and foot exams.  Told patient to make appt.for diabetic eye exam, CMAs here will do foot exams.  - Encouraged patient to visit the [place] or the American diabetes Association --> read information about what "the recommended A1c levels are in 71 year olds".  - We will continue to monitor.    Erosive pustular dermatosis of scalp    -S-E of Anemia due to Chemical Agent/ Dapsone - Managed by Derm specialist - Per patient, saw [facility] dermatology on 12/30/2019 for second opinion, and was told to follow up as needed.  - Patient will continue treatment plan as established by [place].  - Per patient, dapsone dosage recently decreased to 2 tablets per day (from 3 prior) due to S-E of anemia.  - Per patient, experiencing significant stress and anxiety due to this condition. -Patient declines going on antianxiety medicines at this time.  He says he will contact his health insurance about stress management programs which she has already begun to do.  He declines referral to a psychologist for CBT.   I recommended he engage in meditation of  15 to 20 minutes 2-3 times daily for management of his neuropathic-like pain he experiences from his scalp condition as well as the stress that causes him.  - Will continue to monitor alongside specialist.    Benign Essential Tremor - Saw Dr. Rexene Alberts of Neurology for this. - Per neurology, symptoms worsened recently due to anxiety. - Patient advised of findings, but declined medications for tremor or anxiety. -Not a candidate for various medications due to potential side effects.  -Patient's tremors today are essentially stable however if worsens and patient thinks he needs medication management, we would defer to neurology for this.  - Will continue to monitor alongside specialist.    -  GAD (generalized anxiety disorder) -  Stress and Adjustment Reaction - Discussed patient's symptoms of anxiety, especially as related to his erosive pustular dermatosis of scalp.  - Reviewed the "spokes of the wheel" of mood and health management.  Stressed the importance of ongoing prudent habits, including regular exercise, appropriate sleep hygiene, healthful dietary habits, and prayer/meditation to relax.  - Patient declines medications for anxiety at this time.  - Encouraged patient to let us know if he desires referral to psychiatry or psychology as discussed.  - Will continue to monitor.    Lifestyle & Preventative Health Maintenance - Advised patient to continue working toward exercising to improve overall mental, physical, and emotional health.    - Encouraged patient to engage in daily physical activity as tolerated, especially a formal exercise routine.  Recommended that the patient eventually strive for at least 150 minutes of moderate cardiovascular activity per week according to guidelines established by the [facility]   - Healthy dietary habits encouraged, including low-carb, and high amounts of lean protein in diet.   - Patient should also consume adequate amounts of water.  - Health  counseling performed.  All questions answered.  -  As part of my medical decision making, I reviewed the following data within the [place] History obtained from pt /family, CMA notes reviewed and incorporated if applicable, Labs reviewed, Radiograph/ tests reviewed if applicable and OV notes from prior OV's with me, as well as other specialists she/he has seen since seeing me last, were all reviewed and used in my medical decision making process today.    - Additionally, discussion had with patient regarding our treatment plan, and their biases/concerns about that plan were used in my medical decision making today.    - The patient agreed with the plan and demonstrated an understanding of the instructions.   No barriers to understanding were identified.    - Red flag symptoms and signs discussed in detail.  Patient expressed understanding regarding what to do in case of emergency\ urgent symptoms.   - The patient was advised to call back or seek an in-person evaluation if the symptoms worsen or if the condition fails to improve as anticipated.   Return 3 days prior to OV for check CBC, CMP, A1c, urine micro, mag, B12, vitamin D- all ordered today  ( NO NEED FOR LIPID PANEL)  - f/up OV 60mo since added 1/2 tab 1mg  glimepiride today - with NON-Fasting bldwrk 3 d prior; all necessary labs needed for that OV reordered today to avoid any confusion.    Orders Placed This Encounter  Procedures  . Hemoglobin A1c  . Magnesium  . Vitamin B12  . VITAMIN D 25 Hydroxy (Vit-D Deficiency, Fractures)  . Microalbumin / creatinine urine ratio  . CBC with Differential/Platelet  . Comprehensive metabolic panel    Meds ordered this encounter  Medications  . glimepiride (AMARYL) 1 MG tablet    Sig: Take 0.5 tablets (0.5 mg total) by mouth daily with breakfast.    Dispense:  45 tablet    Refill:  0    I provided 28 minutes of non face-to-face time during this encounter.  Additional  time was spent with charting and coordination of care before and after the actual visit commenced.   Note:  This note was prepared with assistance of Dragon voice recognition software. Occasional wrong-word or sound-a-like substitutions may have occurred due to the inherent limitations of voice recognition software.  This document serves as a record of services personally performed by Mellody Dance, DO. It was created on her behalf by Toni Amend, a trained medical scribe. The creation of this record is based on the scribe's personal observations and the provider's statements to them.   This case required medical decision making of at least moderate complexity. The above documentation from Toni Amend, medical scribe, has been reviewed by Marjory Sneddon, D.O.      Patient Care Team    Relationship Specialty Notifications Start End  Mellody Dance, DO PCP - General Family Medicine  09/01/17   Jerrell Belfast, MD Consulting Physician Otolaryngology  03/16/18   Jarome Matin, MD Consulting Physician Dermatology  03/16/18   Franchot Gallo, MD Consulting Physician Urology  07/27/18   Jamse Arn, MD Consulting Physician Physical Medicine and Rehabilitation  07/27/18   Deneise Lever, MD Consulting Physician Pulmonary Disease  07/27/18   Park Liter, MD Consulting Physician Cardiology  11/27/18   Garry Heater, MD Referring Physician Dermatology  06/02/19   Star Age, MD Attending Physician Neurology  12/08/19    Comment: seen for TREMORS--> last seen 12/2018  Marygrace Drought, MD Consulting Physician Ophthalmology  12/08/19  Robyne Peers, MD  Dermatology  01/12/20      -Vitals obtained; medications/ allergies reconciled;  personal medical, social, Sx etc.histories were updated by CMA, reviewed by me and are reflected in chart   Patient Active Problem List   Diagnosis Date Noted  . Hypertension associated with diabetes ([place]) 08/05/2017  . Mixed  diabetic hyperlipidemia associated with type 2 diabetes mellitus ([place]) 08/05/2017  . Microalbuminuria due to type 2 diabetes mellitus ([place]) 08/05/2017  . Type 2 diabetes mellitus with stage 3 chronic kidney disease, without long-term current use of insulin ([place]) 05/05/2017  . 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 Stress and adjustment reaction 01/12/2020  . Chronic head/ "scalp" pain 01/12/2020  . GAD (generalized anxiety disorder) 12/10/2019  . 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 Erosive pustular dermatosis of scalp 12/08/2019  . Dehydration- mild 12/08/2019  . Benign essential tremor 12/08/2019  . Vitamin D deficiency 12/08/2019  . Decreased activity 12/08/2019  . Recurrent right inguinal hernia 05/18/2019  . Atypical chest pain 05/07/2019  . High risk medication use 01/15/2019  . Venous insufficiency of both lower extremities 11/27/2018  . Tremor of both hands-  both wiht rest and activity; acutely worse 11/27/2018  . Orthostatic hypotension 11/05/2018  . Cancer ([place]) 07/27/2018  . Folliculitis- scalp; txed by Derm- Dr Jarome Matin 07/27/2018  . Bilateral lower extremity edema 11/17/2017  . RLS (restless legs syndrome) 06/17/2017  . GERD (gastroesophageal reflux disease) 05/05/2017  . Glossodynia 05/05/2017  . Costochondritis 05/05/2017  . Stage 3 chronic kidney disease ([place]) 04/22/2017  . Abdominal pain 03/25/2017  . CAD (coronary artery disease) 03/25/2017  . Diabetes mellitus without complication ([place]) 51/88/4166  . Snoring 03/25/2017  . Urinary retention due to benign prostatic hyperplasia 01/25/2015  . Primary osteoarthritis of right shoulder 01/24/2015  . Edema, peripheral 11/22/2014     Current Meds  Medication Sig  . aspirin 81 MG tablet Take 81 mg by mouth every evening.   Marland Kitchen atorvastatin (LIPITOR) 10 MG tablet Take 1 tablet every night before bed  . clobetasol ointment (TEMOVATE) 0.05 %   . Cobalamine  Combinations (A-63 + FOLIC ACID PO) Take 1 capsule by mouth every evening.   . dapsone 25 MG tablet Take 2 tablets by mouth daily. Per his dermatologist due to s-e anemia  . glimepiride (AMARYL) 1 MG tablet Take 0.5 tablets (0.5 mg total) by mouth daily with breakfast.  . losartan (COZAAR) 25 MG tablet Take 1 tablet (25 mg total) by mouth daily.  . metFORMIN (GLUCOPHAGE) 500 MG tablet Take 1 tablet (500 mg total) by mouth 2 (two) times daily with a meal.  . Polyethyl Glycol-Propyl Glycol (SYSTANE OP) Place 1 drop into both eyes daily.  . polyethylene glycol (MIRALAX / GLYCOLAX) packet Take 17 g by mouth daily as needed for mild constipation.   . Probiotic Product (PROBIOTIC DAILY PO) Take 2 capsules by mouth daily with lunch.  . silver sulfADIAZINE (SILVADENE) 1 % cream Apply topically 2 (two) times daily.  . Vitamin D, Ergocalciferol, (DRISDOL) 1.25 MG (50000 UNIT) CAPS capsule Take one tablet wkly     Allergies:  Allergies  Allergen Reactions  . Exenatide Nausea And Vomiting  . Meperidine And Related Other (See Comments)    Red streak, that happened once while having an IV  . Nadolol Other (See Comments)    Numbness in fingers  . Sitagliptin Phosphate [Sitagliptin] Other (See Comments)    Weight loss  ROS:  See above HPI for pertinent positives and negatives   Objective:   Blood pressure 139/76, pulse 70, height 6' (1.829 m), weight 154 lb (69.9 kg).  (if some vitals are omitted, this means that patient was UNABLE to obtain them even though they were asked to get them prior to OV today.  They were asked to call us at their earliest convenience with these once obtained. )  General: A & O * 3; sounds in no acute distress; in usual state of health.  Skin: Pt confirms warm and dry extremities and pink fingertips HEENT: Pt confirms lips non-cyanotic Chest: Patient confirms normal chest excursion and movement Respiratory: speaking in full sentences, no conversational dyspnea;  patient confirms no use of accessory muscles Psych: insight appears good, mood- appears full

## 2020-01-14 DIAGNOSIS — C44622 Squamous cell carcinoma of skin of right upper limb, including shoulder: Secondary | ICD-10-CM | POA: Diagnosis not present

## 2020-01-14 DIAGNOSIS — B356 Tinea cruris: Secondary | ICD-10-CM | POA: Diagnosis not present

## 2020-01-14 DIAGNOSIS — L570 Actinic keratosis: Secondary | ICD-10-CM | POA: Diagnosis not present

## 2020-01-14 DIAGNOSIS — Z5181 Encounter for therapeutic drug level monitoring: Secondary | ICD-10-CM | POA: Diagnosis not present

## 2020-01-14 DIAGNOSIS — Z79899 Other long term (current) drug therapy: Secondary | ICD-10-CM | POA: Diagnosis not present

## 2020-01-14 DIAGNOSIS — D0461 Carcinoma in situ of skin of right upper limb, including shoulder: Secondary | ICD-10-CM | POA: Diagnosis not present

## 2020-01-14 DIAGNOSIS — L989 Disorder of the skin and subcutaneous tissue, unspecified: Secondary | ICD-10-CM | POA: Diagnosis not present

## 2020-01-17 ENCOUNTER — Encounter: Payer: Self-pay | Admitting: Family Medicine

## 2020-02-29 ENCOUNTER — Other Ambulatory Visit: Payer: Self-pay | Admitting: Family Medicine

## 2020-02-29 DIAGNOSIS — E1169 Type 2 diabetes mellitus with other specified complication: Secondary | ICD-10-CM

## 2020-03-07 ENCOUNTER — Other Ambulatory Visit: Payer: Medicare Other

## 2020-03-07 ENCOUNTER — Other Ambulatory Visit: Payer: Self-pay

## 2020-03-07 DIAGNOSIS — E1122 Type 2 diabetes mellitus with diabetic chronic kidney disease: Secondary | ICD-10-CM | POA: Diagnosis not present

## 2020-03-07 DIAGNOSIS — E1129 Type 2 diabetes mellitus with other diabetic kidney complication: Secondary | ICD-10-CM | POA: Diagnosis not present

## 2020-03-07 DIAGNOSIS — R809 Proteinuria, unspecified: Secondary | ICD-10-CM

## 2020-03-07 DIAGNOSIS — N183 Chronic kidney disease, stage 3 unspecified: Secondary | ICD-10-CM | POA: Diagnosis not present

## 2020-03-07 DIAGNOSIS — D594 Other nonautoimmune hemolytic anemias: Secondary | ICD-10-CM

## 2020-03-07 DIAGNOSIS — G250 Essential tremor: Secondary | ICD-10-CM | POA: Diagnosis not present

## 2020-03-07 DIAGNOSIS — E1169 Type 2 diabetes mellitus with other specified complication: Secondary | ICD-10-CM | POA: Diagnosis not present

## 2020-03-07 DIAGNOSIS — Z79899 Other long term (current) drug therapy: Secondary | ICD-10-CM | POA: Diagnosis not present

## 2020-03-07 DIAGNOSIS — E1159 Type 2 diabetes mellitus with other circulatory complications: Secondary | ICD-10-CM

## 2020-03-07 DIAGNOSIS — I10 Essential (primary) hypertension: Secondary | ICD-10-CM | POA: Diagnosis not present

## 2020-03-07 DIAGNOSIS — E782 Mixed hyperlipidemia: Secondary | ICD-10-CM | POA: Diagnosis not present

## 2020-03-07 DIAGNOSIS — E559 Vitamin D deficiency, unspecified: Secondary | ICD-10-CM

## 2020-03-08 DIAGNOSIS — R351 Nocturia: Secondary | ICD-10-CM | POA: Diagnosis not present

## 2020-03-08 DIAGNOSIS — N401 Enlarged prostate with lower urinary tract symptoms: Secondary | ICD-10-CM | POA: Diagnosis not present

## 2020-03-08 LAB — COMPREHENSIVE METABOLIC PANEL
ALT: 10 IU/L (ref 0–44)
AST: 19 IU/L (ref 0–40)
Albumin/Globulin Ratio: 2.1 (ref 1.2–2.2)
Albumin: 4 g/dL (ref 3.6–4.6)
Alkaline Phosphatase: 58 IU/L (ref 39–117)
BUN/Creatinine Ratio: 15 (ref 10–24)
BUN: 19 mg/dL (ref 8–27)
Bilirubin Total: 0.4 mg/dL (ref 0.0–1.2)
CO2: 24 mmol/L (ref 20–29)
Calcium: 9.7 mg/dL (ref 8.6–10.2)
Chloride: 103 mmol/L (ref 96–106)
Creatinine, Ser: 1.31 mg/dL — ABNORMAL HIGH (ref 0.76–1.27)
GFR calc Af Amer: 57 mL/min/{1.73_m2} — ABNORMAL LOW (ref >59)
GFR calc non Af Amer: 49 mL/min/{1.73_m2} — ABNORMAL LOW (ref >59)
Globulin, Total: 1.9 g/dL (ref 1.5–4.5)
Glucose: 115 mg/dL — ABNORMAL HIGH (ref 65–99)
Potassium: 4.6 mmol/L (ref 3.5–5.2)
Sodium: 142 mmol/L (ref 134–144)
Total Protein: 5.9 g/dL — ABNORMAL LOW (ref 6.0–8.5)

## 2020-03-08 LAB — CBC WITH DIFFERENTIAL/PLATELET
Basophils Absolute: 0.1 10*3/uL (ref 0.0–0.2)
Basos: 1 %
EOS (ABSOLUTE): 0.1 10*3/uL (ref 0.0–0.4)
Eos: 1 %
Hematocrit: 38.3 % (ref 37.5–51.0)
Hemoglobin: 12.8 g/dL — ABNORMAL LOW (ref 13.0–17.7)
Immature Grans (Abs): 0 10*3/uL (ref 0.0–0.1)
Immature Granulocytes: 0 %
Lymphocytes Absolute: 3.1 10*3/uL (ref 0.7–3.1)
Lymphs: 39 %
MCH: 33.8 pg — ABNORMAL HIGH (ref 26.6–33.0)
MCHC: 33.4 g/dL (ref 31.5–35.7)
MCV: 101 fL — ABNORMAL HIGH (ref 79–97)
Monocytes Absolute: 0.6 10*3/uL (ref 0.1–0.9)
Monocytes: 7 %
Neutrophils Absolute: 4.1 10*3/uL (ref 1.4–7.0)
Neutrophils: 52 %
Platelets: 176 10*3/uL (ref 150–450)
RBC: 3.79 x10E6/uL — ABNORMAL LOW (ref 4.14–5.80)
RDW: 11.6 % (ref 11.6–15.4)
WBC: 8 10*3/uL (ref 3.4–10.8)

## 2020-03-08 LAB — VITAMIN B12: Vitamin B-12: 1289 pg/mL — ABNORMAL HIGH (ref 232–1245)

## 2020-03-08 LAB — HEMOGLOBIN A1C
Est. average glucose Bld gHb Est-mCnc: 203 mg/dL
Hgb A1c MFr Bld: 8.7 % — ABNORMAL HIGH (ref 4.8–5.6)

## 2020-03-08 LAB — MICROALBUMIN / CREATININE URINE RATIO
Creatinine, Urine: 59.8 mg/dL
Microalb/Creat Ratio: <5 mg/g creat (ref 0–29)
Microalbumin, Urine: <3 ug/mL

## 2020-03-08 LAB — MAGNESIUM: Magnesium: 1.9 mg/dL (ref 1.6–2.3)

## 2020-03-08 LAB — VITAMIN D 25 HYDROXY (VIT D DEFICIENCY, FRACTURES): Vit D, 25-Hydroxy: 71.8 ng/mL (ref 30.0–100.0)

## 2020-03-10 ENCOUNTER — Other Ambulatory Visit: Payer: Self-pay

## 2020-03-10 ENCOUNTER — Ambulatory Visit (INDEPENDENT_AMBULATORY_CARE_PROVIDER_SITE_OTHER): Payer: Medicare Other | Admitting: Family Medicine

## 2020-03-10 ENCOUNTER — Encounter: Payer: Self-pay | Admitting: Family Medicine

## 2020-03-10 VITALS — BP 163/89 | HR 59 | Temp 97.5°F | Ht 72.0 in | Wt 156.7 lb

## 2020-03-10 DIAGNOSIS — E1169 Type 2 diabetes mellitus with other specified complication: Secondary | ICD-10-CM

## 2020-03-10 DIAGNOSIS — E1159 Type 2 diabetes mellitus with other circulatory complications: Secondary | ICD-10-CM | POA: Diagnosis not present

## 2020-03-10 DIAGNOSIS — E1122 Type 2 diabetes mellitus with diabetic chronic kidney disease: Secondary | ICD-10-CM

## 2020-03-10 DIAGNOSIS — I10 Essential (primary) hypertension: Secondary | ICD-10-CM

## 2020-03-10 DIAGNOSIS — R809 Proteinuria, unspecified: Secondary | ICD-10-CM

## 2020-03-10 DIAGNOSIS — N183 Chronic kidney disease, stage 3 unspecified: Secondary | ICD-10-CM | POA: Diagnosis not present

## 2020-03-10 DIAGNOSIS — C801 Malignant (primary) neoplasm, unspecified: Secondary | ICD-10-CM

## 2020-03-10 DIAGNOSIS — E782 Mixed hyperlipidemia: Secondary | ICD-10-CM

## 2020-03-10 DIAGNOSIS — D594 Other nonautoimmune hemolytic anemias: Secondary | ICD-10-CM | POA: Diagnosis not present

## 2020-03-10 DIAGNOSIS — E559 Vitamin D deficiency, unspecified: Secondary | ICD-10-CM | POA: Diagnosis not present

## 2020-03-10 DIAGNOSIS — E1129 Type 2 diabetes mellitus with other diabetic kidney complication: Secondary | ICD-10-CM

## 2020-03-10 HISTORY — DX: Essential (primary) hypertension: I10

## 2020-03-10 MED ORDER — GLIMEPIRIDE 1 MG PO TABS: 1.0000 mg | ORAL_TABLET | Freq: Every day | ORAL | 0 refills | Status: DC

## 2020-03-10 MED ORDER — VITAMIN D (ERGOCALCIFEROL) 1.25 MG (50000 UNIT) PO CAPS: ORAL_CAPSULE | ORAL | 3 refills | Status: DC

## 2020-03-10 NOTE — Progress Notes (Signed)
Impression and Recommendations:    1. Type 2 diabetes mellitus with stage 3 chronic kidney disease, without long-term current use of insulin, unspecified whether stage 3a or 3b CKD ([place])   2. Hypertension associated with diabetes ([place])   3. White coat syndrome with diagnosis of hypertension   4. Mixed diabetic hyperlipidemia associated with type 2 diabetes mellitus ([place])   5. Vitamin D deficiency   6. Microalbuminuria due to type 2 diabetes mellitus ([place])   7. 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 Cancer [facility])      Lab Review - Reviewed recent lab work (03/07/2020) in depth with patient today.  All lab work within normal limits unless otherwise noted.  Extensive education provided and all questions answered.  - As patient continues management on dermatological medications, will continue to closely monitor patient's labs.  Recommended re-check of kidney function in 3-6 months.   Type 2 diabetes mellitus with stage 3 chronic kidney disease, without long-term current use of insulin, unspecified whether stage 3a or 3b CKD - Last A1c checked three days ago at 8.7, elevated, up from 5.2 three months ago.  - Discussed that glimepiride can cause low blood sugars, but with patient's recently elevated A1c, need to resume increased dosage of Amaryl.    - Patient understands increased risk of low blood sugars on Amaryl and agrees to resume increased dose today.  Per patient, has had less than ten low blood sugars in the past year.  - Patient will increase dose of glimepiride/Amaryl to one full tablet (1 mg) daily.  See med list. - Patient will keep a closer eye on his blood sugar as we change medications.  - Continue metformin as prescribed.  See med list.  - Counseled patient on pathophysiology of disease and discussed various treatment options, which always includes dietary and lifestyle modification as first line.    - Importance of low carb,  heart-healthy diet discussed with patient in addition to regular aerobic exercise of 23min 5d/week or more.   - Check FBS and 2 hours after the biggest meal of your day.  Keep log and bring in next OV for my review.     - Also told patient if you ever feel poorly, please check your blood pressure and blood sugar, as one or the other could be the cause of your symptoms.  - Pt reminded about need for yearly eye and foot exams.  Told patient to make appt.for diabetic eye exam, CMAs here will do foot exams  - We will continue to monitor.   Hypertension associated with DM - White Coat Syndrome w/ Diagnosis of HTN - BP elevated to 176/92 on intake today.  - Per patient, experiences white coat syndrome in the doctor's office. - Per patient, BP was 124/69 this morning at home.  - Patient will continue current treatment regimen.  See med list.  - Counseled patient on pathophysiology of disease and discussed various treatment options, which always includes dietary and lifestyle modification as first line.   - Lifestyle changes such as dash and heart healthy diets and engaging in a regular exercise program discussed extensively with patient.   - Ambulatory blood pressure monitoring encouraged at least 3 times weekly.  Keep log and bring in every office visit.  Reminded patient that if they ever feel poorly in any way, to check their blood pressure and pulse.  - We will continue to monitor.   Mixed diabetic  hyperlipidemia associated with type 2 diabetes mellitus - Cholesterol levels stable, at goal last check. - Pt will continue current treatment regimen.  See med list.  - We will continue to monitor and re-check as discussed.   Microalbuminuria due to type 2 diabetes mellitus - Stable at this time. - Will continue to monitor and re-check as discussed.   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 - Much improved from prior. - Will continue to monitor.   B12 - 1289 last check three days ago, slightly above normal of 1245. - Continue supplementation as established.  See med list. - Will continue to monitor and re-check as discussed.   Vitamin D Deficiency - Measured at 71.8 three days ago, up from 24.9 three months ago. - Discussed that patient's Vitamin D is currently at goal.  - In the summer, if desired, patient may take one tablet every ten days. - In the winter, advised patient to take one tablet every week.   Health Counseling & Preventative Maintenance - Advised patient to continue working toward exercising to improve overall mental, physical, and emotional health.    - Reviewed the "spokes of the wheel" of wellbeing.  Stressed the importance of ongoing prudent habits, including regular exercise, appropriate sleep hygiene, healthful dietary habits, and prayer/meditation to relax.  - Encouraged patient to engage in daily physical activity, especially a formal exercise routine.  Recommended that the patient eventually strive for at least 150 minutes of moderate cardiovascular activity per week according to guidelines established by the [facility].   - Healthy dietary habits encouraged, including low-carb, and high amounts of lean protein in diet.   - Patient should also consume adequate amounts of water.  - Health counseling performed.  All questions answered.   Recommendations - Lengthy discussion held with patient today regarding transition to safer living spaces as an aging patient.  Patient agrees to begin to look into options for forms of assisted living.   Meds ordered this encounter  Medications  . Vitamin D, Ergocalciferol, (DRISDOL) 1.25 MG (50000 UNIT) CAPS capsule    Sig: take one tab every 10d,    Dispense:  12 capsule    Refill:  3    Give pt 1 year supply please.  Last script was for full year - from Jan 2021, but per pt, no RF's on bottle  .  glimepiride (AMARYL) 1 MG tablet    Sig: Take 1 tablet (1 mg total) by mouth daily with breakfast.    Dispense:  90 tablet    Refill:  0    Medications Discontinued During This Encounter  Medication Reason  . Vitamin D, Ergocalciferol, (DRISDOL) 1.25 MG (50000 UNIT) CAPS capsule   . glimepiride (AMARYL) 1 MG tablet Reorder      Please see AVS handed out to patient at the end of our visit for further patient instructions/ counseling done pertaining to today's office visit.   Return for DM, HTN, HLD follow up every 16mo.     Note:  This note was prepared with assistance of Dragon voice recognition software. Occasional wrong-word or sound-a-like substitutions may have occurred due to the inherent limitations of voice recognition software.   The [place] was signed into law in 2016 which includes the topic of electronic health records.  This provides immediate access to information in MyChart.  This includes consultation notes, operative notes, office notes, lab  results and pathology reports.  If you have any questions about what you read please let us know at your next visit or call us at the office.  We are right here with you.    This case required medical decision making of at least moderate complexity.  This document serves as a record of services personally performed by Mellody Dance, DO. It was created on her behalf by Toni Amend, a trained medical scribe. The creation of this record is based on the scribe's personal observations and the provider's statements to them.    The above documentation from Toni Amend, medical scribe, has been reviewed by Marjory Sneddon, D.O.       --------------------------------------------------------------------------------------------------------------------------------------------------------------------------------------------------------------------------------------------    Subjective:     Phillips Odor, am serving as scribe for Dr.Amon Costilla.   HPI: George Barnett is a 86 y.o. male who presents to [place] at [facility] today for issues as discussed below.  He feels safe at home, but notes "it worries me that [my wife] had a stroke, and I'm 87, and I've had a heart attack, stents, and all that."  - Dermatological Concerns Notes during appointment that while his scalp looks better, "it doesn't feel good."  He has had some additional treatment, and after this, "the part that's bald got better, but the hair part got worse."  He has another appointment at the first of next month.  Says he doesn't know what they will do next, and "I think we're running out of options."  Thinks he's "more or less getting used to it" at this point.  Says it "hurts almost all the time."  He went in for a second opinion, and notes "he more or less agreed with what they were doing."  Says all of his treatments are making him sun sensitive, even lights in his house.  - LE Swelling Says he has had increased swelling in his legs, but believes this is due to his inability to be as active as before.  Says since he's been treating his scalp concerns, "I've been immobile."  To help with his inactivity, he has been trying to get on the treadmill for about 10 minutes per day.  Notes he tries to do it every day, but misses some days.  - Vitamin D Supplementation He is currently taking the once weekly tablet.  HPI:   Diabetes Mellitus:  Home glucose readings:  129 this morning.  Sometimes it creeps into the 130's.  Denies unusual lows or highs.  States it's usually "in the 129 range."   - Patient reports good compliance with therapy plan: medication and/or lifestyle modification  - His denies acute concerns or problems related to treatment plan  - He denies new concerns.  Denies polyuria/polydipsia, hypo/ hyperglycemia symptoms.  Denies new onset of: chest pain, exercise intolerance,  shortness of breath, dizziness, visual changes, headache, lower extremity swelling or claudication.   Last A1C in the office was:  Lab Results  Component Value Date   HGBA1C 8.7 (H) 03/07/2020   HGBA1C 5.2 12/03/2019   HGBA1C 6.4 (H) 05/14/2019   Lab Results  Component Value Date   MICROALBUR 10 07/15/2017   LDLCALC 49 12/03/2019   CREATININE 1.31 (H) 03/07/2020   BP Readings from Last 3 Encounters:  03/10/20 (!) 163/89  01/12/20 139/76  12/08/19 140/84   Wt Readings from Last 3 Encounters:  03/10/20 156 lb 11.2 oz (71.1 kg)  01/12/20 154 lb (69.9 kg)  12/08/19 156  lb 1.6 oz (70.8 kg)       Wt Readings from Last 3 Encounters:  03/10/20 156 lb 11.2 oz (71.1 kg)  01/12/20 154 lb (69.9 kg)  12/08/19 156 lb 1.6 oz (70.8 kg)   BP Readings from Last 3 Encounters:  03/10/20 (!) 163/89  01/12/20 139/76  12/08/19 140/84   Pulse Readings from Last 3 Encounters:  03/10/20 (!) 59  01/12/20 70  12/08/19 76   BMI Readings from Last 3 Encounters:  03/10/20 21.25 kg/m  01/12/20 20.89 kg/m  12/08/19 21.17 kg/m     Patient Care Team    Relationship Specialty Notifications Start End  Mellody Dance, DO PCP - General Family Medicine  09/01/17   Jerrell Belfast, MD Consulting Physician Otolaryngology  03/16/18   Jarome Matin, MD Consulting Physician Dermatology  03/16/18   Franchot Gallo, MD Consulting Physician Urology  07/27/18   Jamse Arn, MD Consulting Physician Physical Medicine and Rehabilitation  07/27/18   Deneise Lever, MD Consulting Physician Pulmonary Disease  07/27/18   Park Liter, MD Consulting Physician Cardiology  11/27/18   Garry Heater, MD Referring Physician Dermatology  06/02/19   Star Age, MD Attending Physician Neurology  12/08/19    Comment: seen for TREMORS--> last seen 12/2018  Marygrace Drought, MD Consulting Physician Ophthalmology  12/08/19   Robyne Peers, MD  Dermatology  01/12/20      Patient Active Problem List    Diagnosis Date Noted  . Hypertension associated with diabetes ([place]) 08/05/2017  . Mixed diabetic hyperlipidemia associated with type 2 diabetes mellitus ([place]) 08/05/2017  . Microalbuminuria due to type 2 diabetes mellitus ([place]) 08/05/2017  . Type 2 diabetes mellitus with stage 3 chronic kidney disease, without long-term current use of insulin ([place]) 05/05/2017  . White coat syndrome with diagnosis of hypertension 03/10/2020  . Brittle diabetes mellitus ([place]) 01/12/2020  . Stress and adjustment reaction 01/12/2020  . Chronic head/ "scalp" pain 01/12/2020  . GAD (generalized anxiety disorder) 12/10/2019  . 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 Erosive pustular dermatosis of scalp 12/08/2019  . Dehydration- mild 12/08/2019  . Benign essential tremor 12/08/2019  . Vitamin D deficiency 12/08/2019  . Decreased activity 12/08/2019  . Recurrent right inguinal hernia 05/18/2019  . Atypical chest pain 05/07/2019  . High risk medication use 01/15/2019  . Venous insufficiency of both lower extremities 11/27/2018  . Tremor of both hands-  both wiht rest and activity; acutely worse 11/27/2018  . Orthostatic hypotension 11/05/2018  . Cancer ([place]) 07/27/2018  . Folliculitis- scalp; txed by Derm- Dr Jarome Matin 07/27/2018  . Bilateral lower extremity edema 11/17/2017  . RLS (restless legs syndrome) 06/17/2017  . GERD (gastroesophageal reflux disease) 05/05/2017  . Glossodynia 05/05/2017  . Costochondritis 05/05/2017  . Stage 3 chronic kidney disease ([place]) 04/22/2017  . Abdominal pain 03/25/2017  . CAD (coronary artery disease) 03/25/2017  . Diabetes mellitus without complication ([place]) 85/88/5027  . Snoring 03/25/2017  . Urinary retention due to benign prostatic hyperplasia 01/25/2015  . Primary osteoarthritis of right shoulder 01/24/2015  . Edema, peripheral 11/22/2014    Past Medical history, Surgical history, Family history, Social history,  Allergies and Medications have been entered into the medical record, reviewed and changed as needed.    Current Meds  Medication Sig  . aspirin 81 MG tablet Take 81 mg by mouth every evening.   Marland Kitchen atorvastatin (LIPITOR) 10 MG tablet Take 1 tablet every night before bed  .  furosemide (LASIX) 20 MG tablet Take 20 mg by mouth daily.  Marland Kitchen glimepiride (AMARYL) 1 MG tablet Take 1 tablet (1 mg total) by mouth daily with breakfast.  . losartan (COZAAR) 25 MG tablet Take 1 tablet (25 mg total) by mouth daily.  . metFORMIN (GLUCOPHAGE) 500 MG tablet TAKE 1 TABLET TWICE DAILY  WITH MEALS  . Polyethyl Glycol-Propyl Glycol (SYSTANE OP) Place 1 drop into both eyes daily.  . polyethylene glycol (MIRALAX / GLYCOLAX) packet Take 17 g by mouth daily as needed for mild constipation.   . Probiotic Product (PROBIOTIC DAILY PO) Take 2 capsules by mouth daily with lunch.  . silver sulfADIAZINE (SILVADENE) 1 % cream Apply topically 2 (two) times daily.  . tacrolimus (PROTOPIC) 0.1 % ointment APPLY TO AFFECTED AREA ON THE SKIN TWICE A DAY.  Marland Kitchen Vitamin D, Ergocalciferol, (DRISDOL) 1.25 MG (50000 UNIT) CAPS capsule take one tab every 10d,  . [DISCONTINUED] glimepiride (AMARYL) 1 MG tablet Take 0.5 tablets (0.5 mg total) by mouth daily with breakfast.  . [DISCONTINUED] Vitamin D, Ergocalciferol, (DRISDOL) 1.25 MG (50000 UNIT) CAPS capsule Take one tablet wkly    Allergies:  Allergies  Allergen Reactions  . Exenatide Nausea And Vomiting  . Meperidine And Related Other (See Comments)    Red streak, that happened once while having an IV  . Nadolol Other (See Comments)    Numbness in fingers  . Sitagliptin Phosphate [Sitagliptin] Other (See Comments)    Weight loss      Review of Systems:  A fourteen system review of systems was performed and found to be positive as per HPI.   Objective:   Blood pressure (!) 163/89, pulse (!) 59, temperature (!) 97.5 F (36.4 C), temperature source Oral, height 6' (1.829 m),  weight 156 lb 11.2 oz (71.1 kg), SpO2 100 %. Body mass index is 21.25 kg/m. General:  Well Developed, well nourished, appropriate for stated age.  Neuro:  Alert and oriented,  extra-ocular muscles intact  HEENT:  Normocephalic, atraumatic, neck supple, no carotid bruits appreciated  Skin:  no gross rash, warm, pink. Cardiac:  RRR, S1 S2 Respiratory:  ECTA B/L and A/P, Not using accessory muscles, speaking in full sentences- unlabored. Vascular:  Ext warm, no cyanosis apprec.; cap RF less 2 sec. Psych:  No HI/SI, judgement and insight good, Euthymic mood. Full Affect.

## 2020-03-10 NOTE — Patient Instructions (Addendum)
Your goal blood pressure should be around 135/85 or less on a regular basis.    However, if you are 84 years old or greater, then higher blood pressures are often tolerated based on your medical conditions.  Please follow the recommendations discussed with you by your primary care provider and/or cardiologist.       Hypertension Hypertension, commonly called high blood pressure, is when the force of blood pumping through the arteries is too strong. The arteries are the blood vessels that carry blood from the heart throughout the body. Hypertension forces the heart to work harder to pump blood and may cause arteries to become narrow or stiff. Having untreated or uncontrolled hypertension can cause heart attacks, strokes, kidney disease, and other problems. A blood pressure reading consists of a higher number over a lower number. Ideally, your blood pressure should be below 120/80. The first ("top") number is called the systolic pressure. It is a measure of the pressure in your arteries as your heart beats. The second ("bottom") number is called the diastolic pressure. It is a measure of the pressure in your arteries as the heart relaxes. What are the causes? The cause of this condition is not known. What increases the risk? Some risk factors for high blood pressure are under your control. Others are not. Factors you can change  Smoking.  Having type 2 diabetes mellitus, high cholesterol, or both.  Not getting enough exercise or physical activity.  Being overweight.  Having too much fat, sugar, calories, or salt (sodium) in your diet.  Drinking too much alcohol. Factors that are difficult or impossible to change  Having chronic kidney disease.  Having a family history of high blood pressure.  Age. Risk increases with age.  Race. You may be at higher risk if you are African-American.  Gender. Men are at higher risk than women before age 65. After age 94, women are at higher risk than  men.  Having obstructive sleep apnea.  Stress. What are the signs or symptoms? Extremely high blood pressure (hypertensive crisis) may cause:  Headache.  Anxiety.  Shortness of breath.  Nosebleed.  Nausea and vomiting.  Severe chest pain.  Jerky movements you cannot control (seizures).  How is this diagnosed? This condition is diagnosed by measuring your blood pressure while you are seated, with your arm resting on a surface. The cuff of the blood pressure monitor will be placed directly against the skin of your upper arm at the level of your heart. It should be measured at least twice using the same arm. Certain conditions can cause a difference in blood pressure between your right and left arms. Certain factors can cause blood pressure readings to be lower or higher than normal (elevated) for a short period of time:  When your blood pressure is higher when you are in a health care provider's office than when you are at home, this is called white coat hypertension. Most people with this condition do not need medicines.  When your blood pressure is higher at home than when you are in a health care provider's office, this is called masked hypertension. Most people with this condition may need medicines to control blood pressure.  If you have a high blood pressure reading during one visit or you have normal blood pressure with other risk factors:  You may be asked to return on a different day to have your blood pressure checked again.  You may be asked to monitor your blood pressure at home  for 1 week or longer.  If you are diagnosed with hypertension, you may have other blood or imaging tests to help your health care provider understand your overall risk for other conditions. How is this treated? This condition is treated by making healthy lifestyle changes, such as eating healthy foods, exercising more, and reducing your alcohol intake. Your health care provider may prescribe  medicine if lifestyle changes are not enough to get your blood pressure under control, and if:  Your systolic blood pressure is above 130.  Your diastolic blood pressure is above 80.  Your personal target blood pressure may vary depending on your medical conditions, your age, and other factors. Follow these instructions at home: Eating and drinking  Eat a diet that is high in fiber and potassium, and low in sodium, added sugar, and fat. An example eating plan is called the DASH (Dietary Approaches to Stop Hypertension) diet. To eat this way: ? Eat plenty of fresh fruits and vegetables. Try to fill half of your plate at each meal with fruits and vegetables. ? Eat whole grains, such as whole wheat pasta, brown rice, or whole grain bread. Fill about one quarter of your plate with whole grains. ? Eat or drink low-fat dairy products, such as skim milk or low-fat yogurt. ? Avoid fatty cuts of meat, processed or cured meats, and poultry with skin. Fill about one quarter of your plate with lean proteins, such as fish, chicken without skin, beans, eggs, and tofu. ? Avoid premade and processed foods. These tend to be higher in sodium, added sugar, and fat.  Reduce your daily sodium intake. Most people with hypertension should eat less than 1,500 mg of sodium a day.  Limit alcohol intake to no more than 1 drink a day for nonpregnant women and 2 drinks a day for men. One drink equals 12 oz of beer, 5 oz of wine, or 1 oz of hard liquor. Lifestyle  Work with your health care provider to maintain a healthy body weight or to lose weight. Ask what an ideal weight is for you.  Get at least 30 minutes of exercise that causes your heart to beat faster (aerobic exercise) most days of the week. Activities may include walking, swimming, or biking.  Include exercise to strengthen your muscles (resistance exercise), such as pilates or lifting weights, as part of your weekly exercise routine. Try to do these types  of exercises for 30 minutes at least 3 days a week.  Do not use any products that contain nicotine or tobacco, such as cigarettes and e-cigarettes. If you need help quitting, ask your health care provider.  Monitor your blood pressure at home as told by your health care provider.  Keep all follow-up visits as told by your health care provider. This is important. Medicines  Take over-the-counter and prescription medicines only as told by your health care provider. Follow directions carefully. Blood pressure medicines must be taken as prescribed.  Do not skip doses of blood pressure medicine. Doing this puts you at risk for problems and can make the medicine less effective.  Ask your health care provider about side effects or reactions to medicines that you should watch for. Contact a health care provider if:  You think you are having a reaction to a medicine you are taking.  You have headaches that keep coming back (recurring).  You feel dizzy.  You have swelling in your ankles.  You have trouble with your vision. Get help right away if:  You develop a severe headache or confusion.  You have unusual weakness or numbness.  You feel faint.  You have severe pain in your chest or abdomen.  You vomit repeatedly.  You have trouble breathing. Summary  Hypertension is when the force of blood pumping through your arteries is too strong. If this condition is not controlled, it may put you at risk for serious complications.  Your personal target blood pressure may vary depending on your medical conditions, your age, and other factors. For most people, a normal blood pressure is less than 120/80.  Hypertension is treated with lifestyle changes, medicines, or a combination of both. Lifestyle changes include weight loss, eating a healthy, low-sodium diet, exercising more, and limiting alcohol. This information is not intended to replace advice given to you by your health care provider.  Make sure you discuss any questions you have with your health care provider. Document Released: 11/04/2005 Document Revised: 10/02/2016 Document Reviewed: 10/02/2016 Elsevier Interactive Patient Education  2018 [employer].    How to Take Your Blood Pressure   Blood pressure is a measurement of how strongly your blood is pressing against the walls of your arteries. Arteries are blood vessels that carry blood from your heart throughout your body. Your health care provider takes your blood pressure at each office visit. You can also take your own blood pressure at home with a blood pressure machine. You may need to take your own blood pressure:  To confirm a diagnosis of high blood pressure (hypertension).  To monitor your blood pressure over time.  To make sure your blood pressure medicine is working.  Supplies needed: To take your blood pressure, you will need a blood pressure machine. You can buy a blood pressure machine, or blood pressure monitor, at most drugstores or online. There are several types of home blood pressure monitors. When choosing one, consider the following:  Choose a monitor that has an arm cuff.  Choose a monitor that wraps snugly around your upper arm. You should be able to fit only one finger between your arm and the cuff.  Do not choose a monitor that measures your blood pressure from your wrist or finger.  Your health care provider can suggest a reliable monitor that will meet your needs. How to prepare To get the most accurate reading, avoid the following for 30 minutes before you check your blood pressure:  Drinking caffeine.  Drinking alcohol.  Eating.  Smoking.  Exercising.  Five minutes before you check your blood pressure:  Empty your bladder.  Sit quietly without talking in a dining chair, rather than in a soft couch or armchair.  How to take your blood pressure To check your blood pressure, follow the instructions in the manual that  came with your blood pressure monitor. If you have a digital blood pressure monitor, the instructions may be as follows: 1. Sit up straight. 2. Place your feet on the floor. Do not cross your ankles or legs. 3. Rest your left arm at the level of your heart on a table or desk or on the arm of a chair. 4. Pull up your shirt sleeve. 5. Wrap the blood pressure cuff around the upper part of your left arm, 1 inch (2.5 cm) above your elbow. It is best to wrap the cuff around bare skin. 6. Fit the cuff snugly around your arm. You should be able to place only one finger between the cuff and your arm. 7. Position the cord inside the  groove of your elbow. 8. Press the power button. 9. Sit quietly while the cuff inflates and deflates. 10. Read the digital reading on the monitor screen and write it down (record it). 11. Wait 2-3 minutes, then repeat the steps, starting at step 1.  What does my blood pressure reading mean? A blood pressure reading consists of a higher number over a lower number. Ideally, your blood pressure should be below 120/80. The first ("top") number is called the systolic pressure. It is a measure of the pressure in your arteries as your heart beats. The second ("bottom") number is called the diastolic pressure. It is a measure of the pressure in your arteries as the heart relaxes. Blood pressure is classified into four stages. The following are the stages for adults who do not have a short-term serious illness or a chronic condition. Systolic pressure and diastolic pressure are measured in a unit called mm Hg. Normal  Systolic pressure: below 563.  Diastolic pressure: below 80. Elevated  Systolic pressure: 893-734.  Diastolic pressure: below 80. Hypertension stage 1  Systolic pressure: 287-681.  Diastolic pressure: 15-72. Hypertension stage 2  Systolic pressure: 620 or above.  Diastolic pressure: 90 or above. You can have prehypertension or hypertension even if only the  systolic or only the diastolic number in your reading is higher than normal. Follow these instructions at home:  Check your blood pressure as often as recommended by your health care provider.  Take your monitor to the next appointment with your health care provider to make sure: ? That you are using it correctly. ? That it provides accurate readings.  Be sure you understand what your goal blood pressure numbers are.  Tell your health care provider if you are having any side effects from blood pressure medicine. Contact a health care provider if:  Your blood pressure is consistently high. Get help right away if:  Your systolic blood pressure is higher than 180.  Your diastolic blood pressure is higher than 110. This information is not intended to replace advice given to you by your health care provider. Make sure you discuss any questions you have with your health care provider. Document Released: 04/12/2016 Document Revised: 06/25/2016 Document Reviewed: 04/12/2016 Elsevier Interactive Patient Education  [employer].

## 2020-03-21 DIAGNOSIS — L578 Other skin changes due to chronic exposure to nonionizing radiation: Secondary | ICD-10-CM | POA: Diagnosis not present

## 2020-03-21 DIAGNOSIS — Z885 Allergy status to narcotic agent status: Secondary | ICD-10-CM | POA: Diagnosis not present

## 2020-03-21 DIAGNOSIS — L988 Other specified disorders of the skin and subcutaneous tissue: Secondary | ICD-10-CM | POA: Diagnosis not present

## 2020-03-21 DIAGNOSIS — L568 Other specified acute skin changes due to ultraviolet radiation: Secondary | ICD-10-CM | POA: Diagnosis not present

## 2020-03-21 DIAGNOSIS — Z79899 Other long term (current) drug therapy: Secondary | ICD-10-CM | POA: Diagnosis not present

## 2020-03-21 DIAGNOSIS — Z888 Allergy status to other drugs, medicaments and biological substances status: Secondary | ICD-10-CM | POA: Diagnosis not present

## 2020-03-21 DIAGNOSIS — L131 Subcorneal pustular dermatitis: Secondary | ICD-10-CM | POA: Diagnosis not present

## 2020-03-21 DIAGNOSIS — Z85828 Personal history of other malignant neoplasm of skin: Secondary | ICD-10-CM | POA: Diagnosis not present

## 2020-03-21 DIAGNOSIS — L570 Actinic keratosis: Secondary | ICD-10-CM | POA: Diagnosis not present

## 2020-03-21 DIAGNOSIS — Z872 Personal history of diseases of the skin and subcutaneous tissue: Secondary | ICD-10-CM | POA: Diagnosis not present

## 2020-03-28 DIAGNOSIS — L988 Other specified disorders of the skin and subcutaneous tissue: Secondary | ICD-10-CM | POA: Diagnosis not present

## 2020-03-28 DIAGNOSIS — L570 Actinic keratosis: Secondary | ICD-10-CM | POA: Diagnosis not present

## 2020-04-05 ENCOUNTER — Other Ambulatory Visit: Payer: Self-pay

## 2020-04-06 ENCOUNTER — Ambulatory Visit (INDEPENDENT_AMBULATORY_CARE_PROVIDER_SITE_OTHER): Payer: Medicare Other | Admitting: Cardiology

## 2020-04-06 ENCOUNTER — Other Ambulatory Visit: Payer: Self-pay

## 2020-04-06 ENCOUNTER — Encounter: Payer: Self-pay | Admitting: Cardiology

## 2020-04-06 VITALS — BP 162/86 | HR 74 | Ht 73.0 in | Wt 157.2 lb

## 2020-04-06 DIAGNOSIS — I951 Orthostatic hypotension: Secondary | ICD-10-CM | POA: Diagnosis not present

## 2020-04-06 DIAGNOSIS — E119 Type 2 diabetes mellitus without complications: Secondary | ICD-10-CM

## 2020-04-06 DIAGNOSIS — I2510 Atherosclerotic heart disease of native coronary artery without angina pectoris: Secondary | ICD-10-CM | POA: Diagnosis not present

## 2020-04-06 NOTE — Patient Instructions (Signed)

## 2020-04-06 NOTE — Progress Notes (Signed)
Cardiology Office Note:    Date:  04/06/2020   ID:  George Barnett, DOB 11-Jul-1933, MRN [id number]  PCP:  Lorrene Reid, PA-C  Cardiologist:  Jenne Campus, MD    Referring MD: Lorrene Reid, PA-C   Chief Complaint  Patient presents with  . Follow-up    6 MO FU   Cardiac wise doing well  History of Present Illness:    George Barnett is a 86 y.o. male with past medical history significant for coronary disease, in 1999 he suffered from small myocardial infarction that required stenting, I do not have details regarding that event.  Since that time he seems to be doing well.  He did have a stress test which showed no evidence of ischemia.  He comes today to my office for follow-up.  Initially he was sent to me because of orthostatic hypotension.  His meds resuscitation has been adjusted and is not a problem anymore.  The biggest problem without a doubt is that his scalp.  He complains of having a lot of pain.  He does see different specialist for it with some limited success so far.  Denies having any cardiac complaints.  Past Medical History:  Diagnosis Date  . Cancer [facility])    SKIN CANCER   . Cataract   . Diabetes mellitus without complication ([place])   . Elbow effusion, left   . Frequency of urination   . GERD (gastroesophageal reflux disease)   . Heart attack ([place])   . Heart disease   . Hypertension   . Primary osteoarthritis of right shoulder 01/24/2015  . Prostate enlargement   . Tremors of nervous system   . Urinary retention due to benign prostatic hyperplasia 01/25/2015    Past Surgical History:  Procedure Laterality Date  . CARDIAC CATHETERIZATION     1999  . cardio stint    . HERNIA REPAIR     BILAT   . INGUINAL HERNIA REPAIR  05/18/2019  . INGUINAL HERNIA REPAIR Right 05/18/2019   Procedure: REPAIR RIGHT INGUINAL HERNIA WITH MESH;  Surgeon: Erroll Luna, MD;  Location: [facility];  Service: General;  Laterality: Right;  . MOHS SURGERY     BILAT EARS   .  PROSTATE BIOPSY    . TOTAL SHOULDER ARTHROPLASTY Right 01/24/2015   Procedure: RIGHT TOTAL SHOULDER ARTHROPLASTY;  Surgeon: Marchia Bond, MD;  Location: [facility];  Service: Orthopedics;  Laterality: Right;    Current Medications: Current Meds  Medication Sig  . aspirin 81 MG tablet Take 81 mg by mouth every evening.   Marland Kitchen atorvastatin (LIPITOR) 10 MG tablet Take 1 tablet every night before bed  . Cobalamine Combinations (X-52 + FOLIC ACID PO) Take 1 capsule by mouth every evening.   . furosemide (LASIX) 20 MG tablet Take 20 mg by mouth daily.  Marland Kitchen glimepiride (AMARYL) 1 MG tablet Take 1 tablet (1 mg total) by mouth daily with breakfast.  . losartan (COZAAR) 25 MG tablet Take 1 tablet (25 mg total) by mouth daily.  . metFORMIN (GLUCOPHAGE) 500 MG tablet TAKE 1 TABLET TWICE DAILY  WITH MEALS  . Polyethyl Glycol-Propyl Glycol (SYSTANE OP) Place 1 drop into both eyes daily.  . polyethylene glycol (MIRALAX / GLYCOLAX) packet Take 17 g by mouth daily as needed for mild constipation.   . Probiotic Product (PROBIOTIC DAILY PO) Take 1 capsule by mouth daily with lunch.   . silver sulfADIAZINE (SILVADENE) 1 % cream Apply topically 2 (two) times daily.  . tacrolimus (PROTOPIC) 0.1 %  ointment Apply topically 2 (two) times daily.  . Vitamin D, Ergocalciferol, (DRISDOL) 1.25 MG (50000 UNIT) CAPS capsule take one tab every 10d,     Allergies:   Exenatide, Meperidine and related, Nadolol, and Sitagliptin phosphate [sitagliptin]   Social History   Socioeconomic History  . Marital status: Married    Spouse name: Not on file  . Number of children: Not on file  . Years of education: Not on file  . Highest education level: Not on file  Occupational History  . Not on file  Tobacco Use  . Smoking status: Never Smoker  . Smokeless tobacco: Never Used  Substance and Sexual Activity  . Alcohol use: No  . Drug use: No  . Sexual activity: Not on file  Other Topics Concern  . Not on file  Social History  Narrative  . Not on file   Social Determinants of Health   Financial Resource Strain:   . Difficulty of Paying Living Expenses:   Food Insecurity:   . Worried About Charity fundraiser in the Last Year:   . Arboriculturist in the Last Year:   Transportation Needs:   . Film/video editor (Medical):   Marland Kitchen Lack of Transportation (Non-Medical):   Physical Activity:   . Days of Exercise per Week:   . Minutes of Exercise per Session:   Stress:   . Feeling of Stress :   Social Connections:   . Frequency of Communication with Friends and Family:   . Frequency of Social Gatherings with Friends and Family:   . Attends Religious Services:   . Active Member of Clubs or Organizations:   . Attends Archivist Meetings:   Marland Kitchen Marital Status:      Family History: The patient's family history includes Hypertension in his brother. ROS:   Please see the history of present illness.    All 14 point review of systems negative except as described per history of present illness  EKGs/Labs/Other Studies Reviewed:      Recent Labs: 12/03/2019: TSH 2.290 03/07/2020: ALT 10; BUN 19; Creatinine, Ser 1.31; Hemoglobin 12.8; Magnesium 1.9; Platelets 176; Potassium 4.6; Sodium 142  Recent Lipid Panel    Component Value Date/Time   CHOL 130 12/03/2019 0837   TRIG 55 12/03/2019 0837   HDL 69 12/03/2019 0837   CHOLHDL 1.9 12/03/2019 0837   LDLCALC 49 12/03/2019 0837    Physical Exam:    VS:  BP (!) 162/86   Pulse 74   Ht 6\' 1"  (1.854 m)   Wt 157 lb 3.2 oz (71.3 kg)   SpO2 98%   BMI 20.74 kg/m     Wt Readings from Last 3 Encounters:  04/06/20 157 lb 3.2 oz (71.3 kg)  03/10/20 156 lb 11.2 oz (71.1 kg)  01/12/20 154 lb (69.9 kg)     GEN:  Well nourished, well developed in no acute distress HEENT: Normal NECK: No JVD; No carotid bruits LYMPHATICS: No lymphadenopathy CARDIAC: RRR, no murmurs, no rubs, no gallops RESPIRATORY:  Clear to auscultation without rales, wheezing or  rhonchi  ABDOMEN: Soft, non-tender, non-distended MUSCULOSKELETAL:  No edema; No deformity  SKIN: Warm and dry LOWER EXTREMITIES: no swelling NEUROLOGIC:  Alert and oriented x 3 PSYCHIATRIC:  Normal affect   ASSESSMENT:    1. Orthostatic hypotension   2. Coronary artery disease involving native coronary artery of native heart without angina pectoris   3. Diabetes mellitus without complication ([place])    PLAN:  In order of problems listed above:  1. Orthostatic hypotension denies have any symptoms.  No chest pain tightness squeezing pressure burning chest. 2. Coronary artery disease: Asymptomatic doing well appropriate medication will continue. 3. Diabetes mellitus: Recently he was find to have hemoglobin A1c being elevated.  Apparently what led to that he is changing his medication.  Adjustment has been made and we are awaiting results of repeated hemoglobin A1c.  I did review KP) 10 laboratory test from primary care physician. 4. Dyslipidemia I do have her last fasting lipid profile showing LDL of 46 and HDL of 69.  That is on Lipitor 10 mg daily which I will continue.   Medication Adjustments/Labs and Tests Ordered: Current medicines are reviewed at length with the patient today.  Concerns regarding medicines are outlined above.  Orders Placed This Encounter  Procedures  . EKG 12-Lead   Medication changes: No orders of the defined types were placed in this encounter.   Signed, Park Liter, MD, [facility] 04/06/2020 1:36 PM    [place]

## 2020-04-22 ENCOUNTER — Other Ambulatory Visit: Payer: Self-pay | Admitting: Family Medicine

## 2020-04-22 DIAGNOSIS — E1169 Type 2 diabetes mellitus with other specified complication: Secondary | ICD-10-CM

## 2020-04-25 DIAGNOSIS — K5900 Constipation, unspecified: Secondary | ICD-10-CM | POA: Diagnosis not present

## 2020-04-25 DIAGNOSIS — K862 Cyst of pancreas: Secondary | ICD-10-CM | POA: Diagnosis not present

## 2020-04-26 DIAGNOSIS — H04123 Dry eye syndrome of bilateral lacrimal glands: Secondary | ICD-10-CM | POA: Diagnosis not present

## 2020-04-26 DIAGNOSIS — Z961 Presence of intraocular lens: Secondary | ICD-10-CM | POA: Diagnosis not present

## 2020-04-26 DIAGNOSIS — E119 Type 2 diabetes mellitus without complications: Secondary | ICD-10-CM | POA: Diagnosis not present

## 2020-04-26 DIAGNOSIS — H52201 Unspecified astigmatism, right eye: Secondary | ICD-10-CM | POA: Diagnosis not present

## 2020-04-26 LAB — HM DIABETES EYE EXAM

## 2020-05-03 ENCOUNTER — Encounter: Payer: Self-pay | Admitting: Physician Assistant

## 2020-05-09 ENCOUNTER — Other Ambulatory Visit: Payer: Self-pay | Admitting: Physician Assistant

## 2020-05-09 DIAGNOSIS — E1129 Type 2 diabetes mellitus with other diabetic kidney complication: Secondary | ICD-10-CM

## 2020-05-09 DIAGNOSIS — E1122 Type 2 diabetes mellitus with diabetic chronic kidney disease: Secondary | ICD-10-CM

## 2020-05-09 DIAGNOSIS — E1159 Type 2 diabetes mellitus with other circulatory complications: Secondary | ICD-10-CM

## 2020-05-09 DIAGNOSIS — E1169 Type 2 diabetes mellitus with other specified complication: Secondary | ICD-10-CM

## 2020-05-09 MED ORDER — METFORMIN HCL 500 MG PO TABS: 500.0000 mg | ORAL_TABLET | Freq: Two times a day (BID) | ORAL | 0 refills | Status: DC

## 2020-05-09 MED ORDER — ATORVASTATIN CALCIUM 10 MG PO TABS: ORAL_TABLET | ORAL | 0 refills | Status: DC

## 2020-05-09 MED ORDER — LOSARTAN POTASSIUM 25 MG PO TABS: 25.0000 mg | ORAL_TABLET | Freq: Every day | ORAL | 0 refills | Status: DC

## 2020-05-17 ENCOUNTER — Ambulatory Visit (INDEPENDENT_AMBULATORY_CARE_PROVIDER_SITE_OTHER): Payer: Medicare Other | Admitting: Podiatry

## 2020-05-17 ENCOUNTER — Other Ambulatory Visit: Payer: Self-pay

## 2020-05-17 ENCOUNTER — Encounter: Payer: Self-pay | Admitting: Podiatry

## 2020-05-17 VITALS — Temp 97.0°F

## 2020-05-17 DIAGNOSIS — M79672 Pain in left foot: Secondary | ICD-10-CM

## 2020-05-17 DIAGNOSIS — E1165 Type 2 diabetes mellitus with hyperglycemia: Secondary | ICD-10-CM

## 2020-05-17 DIAGNOSIS — L84 Corns and callosities: Secondary | ICD-10-CM | POA: Diagnosis not present

## 2020-05-17 DIAGNOSIS — I872 Venous insufficiency (chronic) (peripheral): Secondary | ICD-10-CM | POA: Diagnosis not present

## 2020-05-17 DIAGNOSIS — R600 Localized edema: Secondary | ICD-10-CM | POA: Diagnosis not present

## 2020-05-17 DIAGNOSIS — I2510 Atherosclerotic heart disease of native coronary artery without angina pectoris: Secondary | ICD-10-CM

## 2020-05-17 DIAGNOSIS — L603 Nail dystrophy: Secondary | ICD-10-CM

## 2020-05-17 NOTE — Progress Notes (Signed)
  Subjective:  Patient ID: George Barnett, male    DOB: September 09, 1933,  MRN: [id number]  Chief Complaint  Patient presents with  . Skin Problem    L plantar forefoot submet 1. Pt stated, "It looks like a plantar's wart. I've had it for several months. It can be very painful - 8/10. I haven't used any OTC products because of my diabetes".  . Diabetes    Most recent A1c on file = 8.7. Fasting AM glucose today = 119mg /dL per pt.  No known history of neuropathy or ulcers.    86 y.o. male presents with the above complaint. The history was reviewed and confirmed with the patient.  Feels like he is walking around.  No treatment for this thus far.  He is also concerned about the appearance of his left hallux nail.  Wants to stay active, this is preventing him from walking as far as he wants in his new shoes.  Does not smoke.  Review of Systems: Negative except as noted in the HPI. Denies N/V/F/Ch. Objective:   Vitals:   05/17/20 1331  Temp: (!) 97 F (36.1 C)    Lower extremity focused examination: Palpable pedal pulses.  Mild +1 pitting edema generalized throughout the left lower extremity.  Spider veins present.  Minor varicosities present.  Porokeratosis submet 1 present.  No evidence of bleeding or verruca.  Sensation to light touch and Semmes-Weinstein filament intact plantar soles and dista pulps of toes   Assessment & Plan:   Encounter Diagnoses  Name Primary?  . Bilateral lower extremity edema   . Venous insufficiency of both lower extremities   . Uncontrolled type 2 diabetes mellitus with hyperglycemia ([place])   . Callus of foot Yes  . Pain in left foot   . Nail dystrophy     Patient was evaluated and treated and all questions answered.   Onychodystrophy and porokeratoma left foot -Patient is diabetic with a qualifying condition for at risk foot care. -Discussed nail dystrophy, and this is likely secondary to microtrauma.  If this comes bothersome we can culture in future if  necessary.  Procedure: Paring of Lesion Rationale: painful hyperkeratotic lesion Type of Debridement: manual, sharp debridement. Instrumentation: #15 blade Number of Lesions: 1   Lanae Crumbly, DPM 05/17/2020    Return if symptoms worsen or fail to improve.

## 2020-05-23 ENCOUNTER — Other Ambulatory Visit: Payer: Self-pay | Admitting: Family Medicine

## 2020-05-23 DIAGNOSIS — N183 Chronic kidney disease, stage 3 unspecified: Secondary | ICD-10-CM

## 2020-06-06 DIAGNOSIS — Z4802 Encounter for removal of sutures: Secondary | ICD-10-CM | POA: Diagnosis not present

## 2020-06-06 DIAGNOSIS — D489 Neoplasm of uncertain behavior, unspecified: Secondary | ICD-10-CM | POA: Diagnosis not present

## 2020-06-07 DIAGNOSIS — R351 Nocturia: Secondary | ICD-10-CM | POA: Diagnosis not present

## 2020-06-07 DIAGNOSIS — N401 Enlarged prostate with lower urinary tract symptoms: Secondary | ICD-10-CM | POA: Diagnosis not present

## 2020-06-09 ENCOUNTER — Other Ambulatory Visit: Payer: Self-pay

## 2020-06-09 ENCOUNTER — Ambulatory Visit (INDEPENDENT_AMBULATORY_CARE_PROVIDER_SITE_OTHER): Payer: Medicare Other | Admitting: Physician Assistant

## 2020-06-09 VITALS — BP 199/82 | HR 64 | Temp 97.5°F | Ht 73.0 in | Wt 156.4 lb

## 2020-06-09 DIAGNOSIS — N183 Chronic kidney disease, stage 3 unspecified: Secondary | ICD-10-CM | POA: Diagnosis not present

## 2020-06-09 DIAGNOSIS — E782 Mixed hyperlipidemia: Secondary | ICD-10-CM | POA: Diagnosis not present

## 2020-06-09 DIAGNOSIS — E1159 Type 2 diabetes mellitus with other circulatory complications: Secondary | ICD-10-CM

## 2020-06-09 DIAGNOSIS — I10 Essential (primary) hypertension: Secondary | ICD-10-CM | POA: Diagnosis not present

## 2020-06-09 DIAGNOSIS — E1122 Type 2 diabetes mellitus with diabetic chronic kidney disease: Secondary | ICD-10-CM

## 2020-06-09 DIAGNOSIS — R600 Localized edema: Secondary | ICD-10-CM

## 2020-06-09 DIAGNOSIS — E1169 Type 2 diabetes mellitus with other specified complication: Secondary | ICD-10-CM | POA: Diagnosis not present

## 2020-06-09 DIAGNOSIS — E119 Type 2 diabetes mellitus without complications: Secondary | ICD-10-CM

## 2020-06-09 LAB — POCT GLYCOSYLATED HEMOGLOBIN (HGB A1C): Hemoglobin A1C: 6.6 % — AB (ref 4.0–5.6)

## 2020-06-09 MED ORDER — FUROSEMIDE 20 MG PO TABS: 20.0000 mg | ORAL_TABLET | Freq: Every day | ORAL | 1 refills | Status: DC

## 2020-06-09 MED ORDER — GLIMEPIRIDE 1 MG PO TABS: 1.0000 mg | ORAL_TABLET | Freq: Every day | ORAL | 1 refills | Status: DC

## 2020-06-09 NOTE — Patient Instructions (Signed)
Edema  Edema is when you have too much fluid in your body or under your skin. Edema may make your legs, feet, and ankles swell up. Swelling is also common in looser tissues, like around your eyes. This is a common condition. It gets more common as you get older. There are many possible causes of edema. Eating too much salt (sodium) and being on your feet or sitting for a long time can cause edema in your legs, feet, and ankles. Hot weather may make edema worse. Edema is usually painless. Your skin may look swollen or shiny. Follow these instructions at home:  Keep the swollen body part raised (elevated) above the level of your heart when you are sitting or lying down.  Do not sit still or stand for a long time.  Do not wear tight clothes. Do not wear garters on your upper legs.  Exercise your legs. This can help the swelling go down.  Wear elastic bandages or support stockings as told by your doctor.  Eat a low-salt (low-sodium) diet to reduce fluid as told by your doctor.  Depending on the cause of your swelling, you may need to limit how much fluid you drink (fluid restriction).  Take over-the-counter and prescription medicines only as told by your doctor. Contact a doctor if:  Treatment is not working.  You have heart, liver, or kidney disease and have symptoms of edema.  You have sudden and unexplained weight gain. Get help right away if:  You have shortness of breath or chest pain.  You cannot breathe when you lie down.  You have pain, redness, or warmth in the swollen areas.  You have heart, liver, or kidney disease and get edema all of a sudden.  You have a fever and your symptoms get worse all of a sudden. Summary  Edema is when you have too much fluid in your body or under your skin.  Edema may make your legs, feet, and ankles swell up. Swelling is also common in looser tissues, like around your eyes.  Raise (elevate) the swollen body part above the level of your  heart when you are sitting or lying down.  Follow your doctor's instructions about diet and how much fluid you can drink (fluid restriction). This information is not intended to replace advice given to you by your health care provider. Make sure you discuss any questions you have with your health care provider. Document Revised: 11/07/2017 Document Reviewed: 11/22/2016 Elsevier Patient Education  2020 Elsevier Inc.  

## 2020-06-09 NOTE — Progress Notes (Signed)
Established Patient Office Visit  Subjective:  Patient ID: George Barnett, male    DOB: [birth year]  Age: 86 y.o. MRN: [id number]  CC:  Chief Complaint  Patient presents with  . Diabetes  . Hyperlipidemia  . Hypertension    HPI George Barnett presents for follow-up on diabetes mellitus, hypertension, and hyperlipidemia.  Patient reports he has been stressed from selling their house and moving to an independent living facility.  Diabetes: Pt denies increased urination or thirst. Pt reports medication compliance.  Reports one hypoglycemic episode.  Checking glucose at home.  FBS on average have been from 105-107.  HTN: Pt denies chest pain, palpitations, or dizziness.  He does report some lower extremity edema.  Patient reports his urologist gave him a prescription for Lasix 20 mg and advised for PCP to continue medication management.  Patient reports medication has helped with edema. Taking antihypertensive medication as directed without side effects. Checks BP at home and readings range in 130s-140s/70s. Pt follows a low salt diet.  HLD: Pt taking medication as directed without issues. Denies side effects including myalgias and RUQ pain.    Past Medical History:  Diagnosis Date  . Cancer [facility])    SKIN CANCER   . Cataract   . Diabetes mellitus without complication ([place])   . Elbow effusion, left   . Frequency of urination   . GERD (gastroesophageal reflux disease)   . Heart attack ([place])   . Heart disease   . Hypertension   . Primary osteoarthritis of right shoulder 01/24/2015  . Prostate enlargement   . Tremors of nervous system   . Urinary retention due to benign prostatic hyperplasia 01/25/2015    Past Surgical History:  Procedure Laterality Date  . CARDIAC CATHETERIZATION     1999  . cardio stint    . HERNIA REPAIR     BILAT   . INGUINAL HERNIA REPAIR  05/18/2019  . INGUINAL HERNIA REPAIR Right 05/18/2019   Procedure: REPAIR RIGHT INGUINAL HERNIA WITH MESH;  Surgeon:  Erroll Luna, MD;  Location: [place];  Service: General;  Laterality: Right;  . MOHS SURGERY     BILAT EARS   . PROSTATE BIOPSY    . TOTAL SHOULDER ARTHROPLASTY Right 01/24/2015   Procedure: RIGHT TOTAL SHOULDER ARTHROPLASTY;  Surgeon: Marchia Bond, MD;  Location: [place];  Service: Orthopedics;  Laterality: Right;    Family History  Problem Relation Age of Onset  . Hypertension Brother     Social History   Socioeconomic History  . Marital status: Married    Spouse name: Not on file  . Number of children: Not on file  . Years of education: Not on file  . Highest education level: Not on file  Occupational History  . Not on file  Tobacco Use  . Smoking status: Never Smoker  . Smokeless tobacco: Never Used  Vaping Use  . Vaping Use: Never used  Substance and Sexual Activity  . Alcohol use: No  . Drug use: No  . Sexual activity: Not on file  Other Topics Concern  . Not on file  Social History Narrative  . Not on file   Social Determinants of Health   Financial Resource Strain:   . Difficulty of Paying Living Expenses:   Food Insecurity:   . Worried About Charity fundraiser in the Last Year:   . Arboriculturist in the Last Year:   Transportation Needs:   . Film/video editor (Medical):   Marland Kitchen  Lack of Transportation (Non-Medical):   Physical Activity:   . Days of Exercise per Week:   . Minutes of Exercise per Session:   Stress:   . Feeling of Stress :   Social Connections:   . Frequency of Communication with Friends and Family:   . Frequency of Social Gatherings with Friends and Family:   . Attends Religious Services:   . Active Member of Clubs or Organizations:   . Attends Archivist Meetings:   Marland Kitchen Marital Status:   Intimate Partner Violence:   . Fear of Current or Ex-Partner:   . Emotionally Abused:   Marland Kitchen Physically Abused:   . Sexually Abused:     Outpatient Medications Prior to Visit  Medication Sig Dispense Refill  . aspirin 81 MG tablet Take  81 mg by mouth every evening.     Marland Kitchen atorvastatin (LIPITOR) 10 MG tablet Take 1 tablet every night before bed 90 tablet 0  . Cobalamine Combinations (R-67 + FOLIC ACID PO) Take 1 capsule by mouth every evening.     . cyanocobalamin 1000 MCG tablet Take 1 tablet by mouth daily.    . Dapsone 7.5 % GEL Apply topically. Apply to scalp twice daily    . losartan (COZAAR) 25 MG tablet Take 1 tablet (25 mg total) by mouth daily. 90 tablet 0  . metFORMIN (GLUCOPHAGE) 500 MG tablet Take 1 tablet (500 mg total) by mouth 2 (two) times daily with a meal. 180 tablet 0  . Polyethyl Glycol-Propyl Glycol (SYSTANE OP) Place 1 drop into both eyes daily.    . polyethylene glycol (MIRALAX / GLYCOLAX) packet Take 17 g by mouth daily as needed for mild constipation.     . Probiotic Product (PROBIOTIC DAILY PO) Take 1 capsule by mouth daily with lunch.     . Vitamin D, Ergocalciferol, (DRISDOL) 1.25 MG (50000 UNIT) CAPS capsule take one tab every 10d, 12 capsule 3  . furosemide (LASIX) 20 MG tablet Take 20 mg by mouth daily.     Marland Kitchen glimepiride (AMARYL) 1 MG tablet Take 1 tablet (1 mg total) by mouth daily with breakfast. 90 tablet 0  . silver sulfADIAZINE (SILVADENE) 1 % cream Apply topically 2 (two) times daily.    . tacrolimus (PROTOPIC) 0.1 % ointment Apply topically 2 (two) times daily.     No facility-administered medications prior to visit.    Allergies  Allergen Reactions  . Exenatide Nausea And Vomiting  . Meperidine And Related Other (See Comments)    Red streak, that happened once while having an IV  . Nadolol Other (See Comments)    Numbness in fingers  . Sitagliptin Phosphate [Sitagliptin] Other (See Comments)    Weight loss     ROS Review of Systems  A fourteen system review of systems was performed and found to be positive as per HPI.  Objective:    Physical Exam General:  Well Developed, well nourished, appropriate for stated age.  Neuro:  Alert and oriented,  extra-ocular muscles intact   HEENT:  Normocephalic, atraumatic, neck supple Skin: Warm, dry Cardiac:  RRR, S1 S2 Respiratory:  ECTA B/L and A/P, Not using accessory muscles, speaking in full sentences- unlabored. Vascular:  Ext warm, no cyanosis apprec.; cap RF less 2 sec. trace lower extremity edema Psych:  No HI/SI, judgement and insight good, Euthymic mood. Full Affect.   BP (!) 199/82   Pulse 64   Temp (!) 97.5 F (36.4 C) (Oral)   Ht 6\' 1"  (1.854  m)   Wt 156 lb 6.4 oz (70.9 kg)   SpO2 99% Comment: on RA  BMI 20.63 kg/m  Wt Readings from Last 3 Encounters:  06/09/20 156 lb 6.4 oz (70.9 kg)  04/06/20 157 lb 3.2 oz (71.3 kg)  03/10/20 156 lb 11.2 oz (71.1 kg)     Health Maintenance Due  Topic Date Due  . TETANUS/TDAP  04/25/2020  . INFLUENZA VACCINE  06/18/2020    There are no preventive care reminders to display for this patient.  Lab Results  Component Value Date   TSH 2.290 12/03/2019   Lab Results  Component Value Date   WBC 8.0 03/07/2020   HGB 12.8 (L) 03/07/2020   HCT 38.3 03/07/2020   MCV 101 (H) 03/07/2020   PLT 176 03/07/2020   Lab Results  Component Value Date   NA 142 03/07/2020   K 4.6 03/07/2020   CO2 24 03/07/2020   GLUCOSE 115 (H) 03/07/2020   BUN 19 03/07/2020   CREATININE 1.31 (H) 03/07/2020   BILITOT 0.4 03/07/2020   ALKPHOS 58 03/07/2020   AST 19 03/07/2020   ALT 10 03/07/2020   PROT 5.9 (L) 03/07/2020   ALBUMIN 4.0 03/07/2020   CALCIUM 9.7 03/07/2020   ANIONGAP 10 05/14/2019   Lab Results  Component Value Date   CHOL 130 12/03/2019   Lab Results  Component Value Date   HDL 69 12/03/2019   Lab Results  Component Value Date   LDLCALC 49 12/03/2019   Lab Results  Component Value Date   TRIG 55 12/03/2019   Lab Results  Component Value Date   CHOLHDL 1.9 12/03/2019   Lab Results  Component Value Date   HGBA1C 6.6 (A) 06/09/2020      Assessment & Plan:   Problem List Items Addressed This Visit      Cardiovascular and Mediastinum    Hypertension associated with diabetes ([place])   Relevant Medications   furosemide (LASIX) 20 MG tablet   glimepiride (AMARYL) 1 MG tablet   White coat syndrome with diagnosis of hypertension   Relevant Medications   furosemide (LASIX) 20 MG tablet     Endocrine   Type 2 diabetes mellitus with stage 3 chronic kidney disease, without long-term current use of insulin (HCC) - Primary (Chronic)   Relevant Medications   glimepiride (AMARYL) 1 MG tablet   Other Relevant Orders   POCT glycosylated hemoglobin (Hb A1C) (Completed)   Mixed diabetic hyperlipidemia associated with type 2 diabetes mellitus (HCC)   Relevant Medications   furosemide (LASIX) 20 MG tablet   glimepiride (AMARYL) 1 MG tablet     Other   Bilateral lower extremity edema   Relevant Medications   furosemide (LASIX) 20 MG tablet     Type 2 diabetes mellitus with stage III chronic kidney disease, without long-term current use of insulin: -A1c today is 6.6, at goal and improved from 8.7 -Continue current medication regimen. -Continue ambulatory glucose monitoring and notify the clinic if frequent hypoglycemic events occur and/or FBS is consistently less than 80 for medication adjustments.  Patient verbalized understanding. -Follow low carbohydrate and glucose diet. -Will continue to monitor.  Hypertension associated with diabetes, White coat syndrome with diagnosis of hypertension: -BP in office elevated, patient has diagnosis of whitecoat syndrome -Home BP readings are stable.  Continue ambulatory BP and pulse monitoring, notify the office if BP consistently above 140/90 for medication adjustments. -Continue current medication regimen. -Continue low-sodium diet. -Stay well-hydrated.  Mixed diabetic hyperlipidemia associated with type  2 diabetes mellitus: -Last lipid panel WNL -Continue current medication regimen. -Follow heart healthy diet and stay as active as possible.  Bilateral lower extremity edema: -Continue  Lasix 20 mg. -Use compression socks and elevation. -Return in 4 weeks for lab visit to check CMP to monitor for electrolyte imbalances especially hypokalemia.   Meds ordered this encounter  Medications  . furosemide (LASIX) 20 MG tablet    Sig: Take 1 tablet (20 mg total) by mouth daily.    Dispense:  90 tablet    Refill:  1    Order Specific Question:   Supervising Provider    Answer:   Beatrice Lecher D [[id number]]  . glimepiride (AMARYL) 1 MG tablet    Sig: Take 1 tablet (1 mg total) by mouth daily with breakfast.    Dispense:  90 tablet    Refill:  1    Order Specific Question:   Supervising Provider    Answer:   Beatrice Lecher D [[id number]]    Follow-up: Return in about 3 months (around 09/09/2020) for DM, HTN, LE edema; lab visit in 4 weeks for cmp.   Note:  This note was prepared with assistance of Dragon voice recognition software. Occasional wrong-word or sound-a-like substitutions may have occurred due to the inherent limitations of voice recognition software.   Lorrene Reid, PA-C

## 2020-06-16 DIAGNOSIS — Z85828 Personal history of other malignant neoplasm of skin: Secondary | ICD-10-CM | POA: Diagnosis not present

## 2020-06-16 DIAGNOSIS — C44629 Squamous cell carcinoma of skin of left upper limb, including shoulder: Secondary | ICD-10-CM | POA: Diagnosis not present

## 2020-06-16 DIAGNOSIS — L905 Scar conditions and fibrosis of skin: Secondary | ICD-10-CM | POA: Diagnosis not present

## 2020-06-29 DIAGNOSIS — C7642 Malignant neoplasm of left upper limb: Secondary | ICD-10-CM | POA: Diagnosis not present

## 2020-06-29 DIAGNOSIS — Z483 Aftercare following surgery for neoplasm: Secondary | ICD-10-CM | POA: Diagnosis not present

## 2020-06-29 DIAGNOSIS — Z4802 Encounter for removal of sutures: Secondary | ICD-10-CM | POA: Diagnosis not present

## 2020-07-06 ENCOUNTER — Other Ambulatory Visit: Payer: Self-pay

## 2020-07-06 ENCOUNTER — Other Ambulatory Visit: Payer: Medicare Other

## 2020-07-06 ENCOUNTER — Other Ambulatory Visit: Payer: Self-pay | Admitting: Physician Assistant

## 2020-07-06 DIAGNOSIS — N183 Chronic kidney disease, stage 3 unspecified: Secondary | ICD-10-CM

## 2020-07-06 DIAGNOSIS — E1159 Type 2 diabetes mellitus with other circulatory complications: Secondary | ICD-10-CM

## 2020-07-06 DIAGNOSIS — E782 Mixed hyperlipidemia: Secondary | ICD-10-CM | POA: Diagnosis not present

## 2020-07-06 DIAGNOSIS — E1122 Type 2 diabetes mellitus with diabetic chronic kidney disease: Secondary | ICD-10-CM | POA: Diagnosis not present

## 2020-07-06 DIAGNOSIS — E1169 Type 2 diabetes mellitus with other specified complication: Secondary | ICD-10-CM

## 2020-07-06 DIAGNOSIS — I10 Essential (primary) hypertension: Secondary | ICD-10-CM | POA: Diagnosis not present

## 2020-07-07 LAB — COMPREHENSIVE METABOLIC PANEL
ALT: 14 IU/L (ref 0–44)
AST: 20 IU/L (ref 0–40)
Albumin/Globulin Ratio: 2.4 — ABNORMAL HIGH (ref 1.2–2.2)
Albumin: 4.5 g/dL (ref 3.6–4.6)
Alkaline Phosphatase: 65 IU/L (ref 48–121)
BUN/Creatinine Ratio: 26 — ABNORMAL HIGH (ref 10–24)
BUN: 37 mg/dL — ABNORMAL HIGH (ref 8–27)
Bilirubin Total: 0.6 mg/dL (ref 0.0–1.2)
CO2: 25 mmol/L (ref 20–29)
Calcium: 9.5 mg/dL (ref 8.6–10.2)
Chloride: 102 mmol/L (ref 96–106)
Creatinine, Ser: 1.41 mg/dL — ABNORMAL HIGH (ref 0.76–1.27)
GFR calc Af Amer: 52 mL/min/{1.73_m2} — ABNORMAL LOW (ref >59)
GFR calc non Af Amer: 45 mL/min/{1.73_m2} — ABNORMAL LOW (ref >59)
Globulin, Total: 1.9 g/dL (ref 1.5–4.5)
Glucose: 155 mg/dL — ABNORMAL HIGH (ref 65–99)
Potassium: 4.9 mmol/L (ref 3.5–5.2)
Sodium: 142 mmol/L (ref 134–144)
Total Protein: 6.4 g/dL (ref 6.0–8.5)

## 2020-07-17 DIAGNOSIS — Z23 Encounter for immunization: Secondary | ICD-10-CM | POA: Diagnosis not present

## 2020-07-19 DIAGNOSIS — L988 Other specified disorders of the skin and subcutaneous tissue: Secondary | ICD-10-CM | POA: Diagnosis not present

## 2020-07-19 DIAGNOSIS — L131 Subcorneal pustular dermatitis: Secondary | ICD-10-CM | POA: Diagnosis not present

## 2020-07-19 DIAGNOSIS — Z885 Allergy status to narcotic agent status: Secondary | ICD-10-CM | POA: Diagnosis not present

## 2020-07-19 DIAGNOSIS — L700 Acne vulgaris: Secondary | ICD-10-CM | POA: Diagnosis not present

## 2020-07-19 DIAGNOSIS — L089 Local infection of the skin and subcutaneous tissue, unspecified: Secondary | ICD-10-CM | POA: Diagnosis not present

## 2020-07-19 DIAGNOSIS — Z888 Allergy status to other drugs, medicaments and biological substances status: Secondary | ICD-10-CM | POA: Diagnosis not present

## 2020-07-19 DIAGNOSIS — T887XXA Unspecified adverse effect of drug or medicament, initial encounter: Secondary | ICD-10-CM | POA: Diagnosis not present

## 2020-08-18 DIAGNOSIS — L988 Other specified disorders of the skin and subcutaneous tissue: Secondary | ICD-10-CM | POA: Diagnosis not present

## 2020-08-18 DIAGNOSIS — Z85828 Personal history of other malignant neoplasm of skin: Secondary | ICD-10-CM | POA: Diagnosis not present

## 2020-08-18 DIAGNOSIS — Z79899 Other long term (current) drug therapy: Secondary | ICD-10-CM | POA: Diagnosis not present

## 2020-08-18 DIAGNOSIS — Z5181 Encounter for therapeutic drug level monitoring: Secondary | ICD-10-CM | POA: Diagnosis not present

## 2020-08-18 DIAGNOSIS — Z872 Personal history of diseases of the skin and subcutaneous tissue: Secondary | ICD-10-CM | POA: Diagnosis not present

## 2020-08-25 DIAGNOSIS — Z23 Encounter for immunization: Secondary | ICD-10-CM | POA: Diagnosis not present

## 2020-09-06 ENCOUNTER — Ambulatory Visit (INDEPENDENT_AMBULATORY_CARE_PROVIDER_SITE_OTHER): Payer: Medicare Other | Admitting: Physician Assistant

## 2020-09-06 ENCOUNTER — Other Ambulatory Visit: Payer: Self-pay

## 2020-09-06 ENCOUNTER — Encounter: Payer: Self-pay | Admitting: Physician Assistant

## 2020-09-06 ENCOUNTER — Other Ambulatory Visit: Payer: Self-pay | Admitting: Physician Assistant

## 2020-09-06 VITALS — BP 105/65 | HR 70 | Temp 97.8°F | Ht 73.0 in | Wt 156.4 lb

## 2020-09-06 DIAGNOSIS — N183 Chronic kidney disease, stage 3 unspecified: Secondary | ICD-10-CM

## 2020-09-06 DIAGNOSIS — E1169 Type 2 diabetes mellitus with other specified complication: Secondary | ICD-10-CM | POA: Diagnosis not present

## 2020-09-06 DIAGNOSIS — E1129 Type 2 diabetes mellitus with other diabetic kidney complication: Secondary | ICD-10-CM

## 2020-09-06 DIAGNOSIS — N184 Chronic kidney disease, stage 4 (severe): Secondary | ICD-10-CM

## 2020-09-06 DIAGNOSIS — E782 Mixed hyperlipidemia: Secondary | ICD-10-CM

## 2020-09-06 DIAGNOSIS — R809 Proteinuria, unspecified: Secondary | ICD-10-CM

## 2020-09-06 DIAGNOSIS — E1159 Type 2 diabetes mellitus with other circulatory complications: Secondary | ICD-10-CM

## 2020-09-06 DIAGNOSIS — E119 Type 2 diabetes mellitus without complications: Secondary | ICD-10-CM | POA: Diagnosis not present

## 2020-09-06 DIAGNOSIS — I152 Hypertension secondary to endocrine disorders: Secondary | ICD-10-CM | POA: Diagnosis not present

## 2020-09-06 LAB — POCT GLYCOSYLATED HEMOGLOBIN (HGB A1C): Hemoglobin A1C: 8.1 % — AB (ref 4.0–5.6)

## 2020-09-06 MED ORDER — ATORVASTATIN CALCIUM 10 MG PO TABS: ORAL_TABLET | ORAL | 1 refills | Status: DC

## 2020-09-06 MED ORDER — METFORMIN HCL 500 MG PO TABS: 500.0000 mg | ORAL_TABLET | Freq: Two times a day (BID) | ORAL | 1 refills | Status: DC

## 2020-09-06 MED ORDER — LOSARTAN POTASSIUM 25 MG PO TABS: 25.0000 mg | ORAL_TABLET | Freq: Every day | ORAL | 1 refills | Status: DC

## 2020-09-06 NOTE — Patient Instructions (Signed)

## 2020-09-06 NOTE — Progress Notes (Signed)
Established Patient Office Visit  Subjective:  Patient ID: George Barnett, male    DOB: 25-Sep-1933  Age: 86 y.o. MRN: [id number]  CC:  Chief Complaint  Patient presents with  . Diabetes  . Hypertension  . edema    HPI George Barnett presents for follow up on diabetes mellitus, hypertension and lower extremity edema.  Diabetes: Pt denies increased urination or thirst. Pt reports medication compliance. No hypoglycemic events. Checking glucose at home.  Reports his fasting blood sugars have been elevated around 200s and recently have started to decrease, today it was 120.  States has completed 2 rounds of prednisone taper for resistant erosive pustular dermatitis.  Denies dietary changes and continues to stay active with walking.  HTN: Pt denies chest pain, palpitations, dizziness or leg swelling. Taking medication as directed without side effects. Checks BP at home and reports readings vary. Pt follows a low salt diet.  Lower extremity edema: Continues to take Lasix which does seem to help with swelling.  Past Medical History:  Diagnosis Date  . Cancer [facility])    SKIN CANCER   . Cataract   . Diabetes mellitus without complication ([place])   . Elbow effusion, left   . Frequency of urination   . GERD (gastroesophageal reflux disease)   . Heart attack ([place])   . Heart disease   . Hypertension   . Primary osteoarthritis of right shoulder 01/24/2015  . Prostate enlargement   . Tremors of nervous system   . Urinary retention due to benign prostatic hyperplasia 01/25/2015    Past Surgical History:  Procedure Laterality Date  . CARDIAC CATHETERIZATION     1999  . cardio stint    . HERNIA REPAIR     BILAT   . INGUINAL HERNIA REPAIR  05/18/2019  . INGUINAL HERNIA REPAIR Right 05/18/2019   Procedure: REPAIR RIGHT INGUINAL HERNIA WITH MESH;  Surgeon: Erroll Luna, MD;  Location: [place];  Service: General;  Laterality: Right;  . MOHS SURGERY     BILAT EARS   . PROSTATE BIOPSY    .  TOTAL SHOULDER ARTHROPLASTY Right 01/24/2015   Procedure: RIGHT TOTAL SHOULDER ARTHROPLASTY;  Surgeon: Marchia Bond, MD;  Location: [place];  Service: Orthopedics;  Laterality: Right;    Family History  Problem Relation Age of Onset  . Hypertension Brother     Social History   Socioeconomic History  . Marital status: Married    Spouse name: Not on file  . Number of children: Not on file  . Years of education: Not on file  . Highest education level: Not on file  Occupational History  . Not on file  Tobacco Use  . Smoking status: Never Smoker  . Smokeless tobacco: Never Used  Vaping Use  . Vaping Use: Never used  Substance and Sexual Activity  . Alcohol use: No  . Drug use: No  . Sexual activity: Not on file  Other Topics Concern  . Not on file  Social History Narrative  . Not on file   Social Determinants of Health   Financial Resource Strain:   . Difficulty of Paying Living Expenses: Not on file  Food Insecurity:   . Worried About Charity fundraiser in the Last Year: Not on file  . Ran Out of Food in the Last Year: Not on file  Transportation Needs:   . Lack of Transportation (Medical): Not on file  . Lack of Transportation (Non-Medical): Not on file  Physical Activity:   .  Days of Exercise per Week: Not on file  . Minutes of Exercise per Session: Not on file  Stress:   . Feeling of Stress : Not on file  Social Connections:   . Frequency of Communication with Friends and Family: Not on file  . Frequency of Social Gatherings with Friends and Family: Not on file  . Attends Religious Services: Not on file  . Active Member of Clubs or Organizations: Not on file  . Attends Archivist Meetings: Not on file  . Marital Status: Not on file  Intimate Partner Violence:   . Fear of Current or Ex-Partner: Not on file  . Emotionally Abused: Not on file  . Physically Abused: Not on file  . Sexually Abused: Not on file    Outpatient Medications Prior to Visit   Medication Sig Dispense Refill  . aspirin 81 MG tablet Take 81 mg by mouth every evening.     . Cobalamine Combinations (T-06 + FOLIC ACID PO) Take 1 capsule by mouth every evening.     . furosemide (LASIX) 20 MG tablet Take 1 tablet (20 mg total) by mouth daily. 90 tablet 1  . glimepiride (AMARYL) 1 MG tablet Take 1 tablet (1 mg total) by mouth daily with breakfast. 90 tablet 1  . Polyethyl Glycol-Propyl Glycol (SYSTANE OP) Place 1 drop into both eyes daily.    . polyethylene glycol (MIRALAX / GLYCOLAX) packet Take 17 g by mouth daily as needed for mild constipation.     . Probiotic Product (PROBIOTIC DAILY PO) Take 1 capsule by mouth daily with lunch.     . Vitamin D, Ergocalciferol, (DRISDOL) 1.25 MG (50000 UNIT) CAPS capsule take one tab every 10d, 12 capsule 3  . atorvastatin (LIPITOR) 10 MG tablet Take 1 tablet every night before bed 90 tablet 0  . losartan (COZAAR) 25 MG tablet Take 1 tablet (25 mg total) by mouth daily. 90 tablet 0  . metFORMIN (GLUCOPHAGE) 500 MG tablet Take 1 tablet (500 mg total) by mouth 2 (two) times daily with a meal. 180 tablet 0  . cyanocobalamin 1000 MCG tablet Take 1 tablet by mouth daily.    . Dapsone 7.5 % GEL Apply topically. Apply to scalp twice daily     No facility-administered medications prior to visit.    Allergies  Allergen Reactions  . Exenatide Nausea And Vomiting  . Meperidine And Related Other (See Comments)    Red streak, that happened once while having an IV  . Nadolol Other (See Comments)    Numbness in fingers  . Sitagliptin Phosphate [Sitagliptin] Other (See Comments)    Weight loss     ROS Review of Systems A fourteen system review of systems was performed and found to be positive as per HPI.   Objective:    Physical Exam General:  Well Developed, well nourished, appropriate for stated age.  Neuro:  Alert and oriented,  extra-ocular muscles intact, no focal deficits HEENT:  Normocephalic, atraumatic, neck supple Skin:   no gross rash, warm, pink. Cardiac:  RRR, S1 S2 Respiratory:  ECTA B/L and A/P, Not using accessory muscles, speaking in full sentences- unlabored. Vascular:  Ext warm, no cyanosis apprec.; cap RF less 2 sec. Psych:  No HI/SI, judgement and insight good, Euthymic mood. Full Affect.   BP 105/65   Pulse 70   Temp 97.8 F (36.6 C) (Oral)   Ht 6\' 1"  (1.854 m)   Wt 156 lb 6.4 oz (70.9 kg)   SpO2  94% Comment: on RA  BMI 20.63 kg/m  Wt Readings from Last 3 Encounters:  09/06/20 156 lb 6.4 oz (70.9 kg)  06/09/20 156 lb 6.4 oz (70.9 kg)  04/06/20 157 lb 3.2 oz (71.3 kg)     Health Maintenance Due  Topic Date Due  . TETANUS/TDAP  04/25/2020  . INFLUENZA VACCINE  06/18/2020    There are no preventive care reminders to display for this patient.  Lab Results  Component Value Date   TSH 2.290 12/03/2019   Lab Results  Component Value Date   WBC 8.0 03/07/2020   HGB 12.8 (L) 03/07/2020   HCT 38.3 03/07/2020   MCV 101 (H) 03/07/2020   PLT 176 03/07/2020   Lab Results  Component Value Date   NA 142 09/06/2020   K 4.7 09/06/2020   CO2 27 09/06/2020   GLUCOSE 111 (H) 09/06/2020   BUN 28 (H) 09/06/2020   CREATININE 1.30 (H) 09/06/2020   BILITOT 0.5 09/06/2020   ALKPHOS 75 09/06/2020   AST 21 09/06/2020   ALT 15 09/06/2020   PROT 6.0 09/06/2020   ALBUMIN 3.9 09/06/2020   CALCIUM 9.2 09/06/2020   ANIONGAP 10 05/14/2019   Lab Results  Component Value Date   CHOL 130 12/03/2019   Lab Results  Component Value Date   HDL 69 12/03/2019   Lab Results  Component Value Date   LDLCALC 49 12/03/2019   Lab Results  Component Value Date   TRIG 55 12/03/2019   Lab Results  Component Value Date   CHOLHDL 1.9 12/03/2019   Lab Results  Component Value Date   HGBA1C 8.1 (A) 09/06/2020      Assessment & Plan:   Problem List Items Addressed This Visit      Cardiovascular and Mediastinum   Hypertension associated with diabetes ([place])   Relevant Medications   losartan  (COZAAR) 25 MG tablet   metFORMIN (GLUCOPHAGE) 500 MG tablet   Other Relevant Orders   Comprehensive metabolic panel (Completed)     Endocrine   Mixed diabetic hyperlipidemia associated with type 2 diabetes mellitus (HCC)   Relevant Medications   losartan (COZAAR) 25 MG tablet   metFORMIN (GLUCOPHAGE) 500 MG tablet   Microalbuminuria due to type 2 diabetes mellitus (HCC)   Relevant Medications   losartan (COZAAR) 25 MG tablet   metFORMIN (GLUCOPHAGE) 500 MG tablet     Genitourinary   Stage 4 chronic kidney disease ([place])    Other Visit Diagnoses    Type 2 diabetes mellitus with other specified complication, without long-term current use of insulin (HCC)    -  Primary   Relevant Medications   losartan (COZAAR) 25 MG tablet   metFORMIN (GLUCOPHAGE) 500 MG tablet   Other Relevant Orders   POCT glycosylated hemoglobin (Hb A1C) (Completed)   Comprehensive metabolic panel (Completed)     Type 2 diabetes mellitus with other specified complication, without long-term current use of insulin: -A1c today significantly increased from prior and most likely due to exogenous steroid use.  Patient prefers to wait before making medication adjustments since he recently completed prednisone therapy, which is reasonable. -Continue current medication regimen. -Continue low carbohydrate and glucose diet. -Continue with walking regimen. -Will continue to monitor.  Hypertension associated with diabetes: -BP stable -Continue current medication regimen. -Continue low-sodium diet. -Stay well-hydrated. -Will continue to monitor.  Mixed diabetic hyperlipidemia associated with type 2 diabetes mellitus: -Last lipid panel WNL, LDL 49 -Continue current medication regimen. -Follow a heart healthy diet low  in saturated and trans fats. -Continue to stay active. -Plan to repeat lipid panel and hepatic function next OV.  Microalbuminuria due to type 2 diabetes mellitus: -Last microalbumin/creatinine ratio  stable. -Continue losartan. -Will continue to monitor.  Stage IV CKD: -Last CMP: Creatinine 1.41, GFR 45 -Will repeat renal function today. -Recommend to avoid nephrotoxic substances such as NSAIDs like ibuprofen.  Meds ordered this encounter  Medications  . DISCONTD: atorvastatin (LIPITOR) 10 MG tablet    Sig: Take 1 tablet every night before bed    Dispense:  90 tablet    Refill:  1  . losartan (COZAAR) 25 MG tablet    Sig: Take 1 tablet (25 mg total) by mouth daily.    Dispense:  90 tablet    Refill:  1  . metFORMIN (GLUCOPHAGE) 500 MG tablet    Sig: Take 1 tablet (500 mg total) by mouth 2 (two) times daily with a meal.    Dispense:  180 tablet    Refill:  1    Follow-up: Return in about 4 months (around 01/07/2021) for DM, HTN, HLD and FBW .   Note:  This note was prepared with assistance of Dragon voice recognition software. Occasional wrong-word or sound-a-like substitutions may have occurred due to the inherent limitations of voice recognition software.  Lorrene Reid, PA-C

## 2020-09-06 NOTE — Telephone Encounter (Signed)
Please review note from pharmacy re: potential drug interaction.  Charyl Bigger, CMA

## 2020-09-06 NOTE — Telephone Encounter (Signed)
Colchicine is not on patient's medication list and do not prescribe for patient. Please check with patient to see if he is taking medication and if so, who prescribes it.  Thank you, Herb Grays

## 2020-09-07 LAB — COMPREHENSIVE METABOLIC PANEL
ALT: 15 IU/L (ref 0–44)
AST: 21 IU/L (ref 0–40)
Albumin/Globulin Ratio: 1.9 (ref 1.2–2.2)
Albumin: 3.9 g/dL (ref 3.6–4.6)
Alkaline Phosphatase: 75 IU/L (ref 44–121)
BUN/Creatinine Ratio: 22 (ref 10–24)
BUN: 28 mg/dL — ABNORMAL HIGH (ref 8–27)
Bilirubin Total: 0.5 mg/dL (ref 0.0–1.2)
CO2: 27 mmol/L (ref 20–29)
Calcium: 9.2 mg/dL (ref 8.6–10.2)
Chloride: 103 mmol/L (ref 96–106)
Creatinine, Ser: 1.3 mg/dL — ABNORMAL HIGH (ref 0.76–1.27)
GFR calc Af Amer: 58 mL/min/{1.73_m2} — ABNORMAL LOW (ref >59)
GFR calc non Af Amer: 50 mL/min/{1.73_m2} — ABNORMAL LOW (ref >59)
Globulin, Total: 2.1 g/dL (ref 1.5–4.5)
Glucose: 111 mg/dL — ABNORMAL HIGH (ref 65–99)
Potassium: 4.7 mmol/L (ref 3.5–5.2)
Sodium: 142 mmol/L (ref 134–144)
Total Protein: 6 g/dL (ref 6.0–8.5)

## 2020-09-08 DIAGNOSIS — N1831 Chronic kidney disease, stage 3a: Secondary | ICD-10-CM | POA: Insufficient documentation

## 2020-09-08 DIAGNOSIS — N184 Chronic kidney disease, stage 4 (severe): Secondary | ICD-10-CM | POA: Insufficient documentation

## 2020-09-08 HISTORY — DX: Chronic kidney disease, stage 4 (severe): N18.4

## 2020-09-08 NOTE — Telephone Encounter (Signed)
Spoke with pt who states that he is not taking this medication.  Please review.  Charyl Bigger, CMA

## 2020-09-14 DIAGNOSIS — Z885 Allergy status to narcotic agent status: Secondary | ICD-10-CM | POA: Diagnosis not present

## 2020-09-14 DIAGNOSIS — D485 Neoplasm of uncertain behavior of skin: Secondary | ICD-10-CM | POA: Diagnosis not present

## 2020-09-14 DIAGNOSIS — Z888 Allergy status to other drugs, medicaments and biological substances status: Secondary | ICD-10-CM | POA: Diagnosis not present

## 2020-09-14 DIAGNOSIS — L131 Subcorneal pustular dermatitis: Secondary | ICD-10-CM | POA: Diagnosis not present

## 2020-09-14 DIAGNOSIS — D0421 Carcinoma in situ of skin of right ear and external auricular canal: Secondary | ICD-10-CM | POA: Diagnosis not present

## 2020-09-14 DIAGNOSIS — L988 Other specified disorders of the skin and subcutaneous tissue: Secondary | ICD-10-CM | POA: Diagnosis not present

## 2020-09-14 DIAGNOSIS — Z5181 Encounter for therapeutic drug level monitoring: Secondary | ICD-10-CM | POA: Diagnosis not present

## 2020-09-14 DIAGNOSIS — Z79899 Other long term (current) drug therapy: Secondary | ICD-10-CM | POA: Diagnosis not present

## 2020-09-14 DIAGNOSIS — R21 Rash and other nonspecific skin eruption: Secondary | ICD-10-CM | POA: Diagnosis not present

## 2020-10-24 ENCOUNTER — Other Ambulatory Visit: Payer: Self-pay

## 2020-10-24 DIAGNOSIS — R251 Tremor, unspecified: Secondary | ICD-10-CM | POA: Insufficient documentation

## 2020-10-24 DIAGNOSIS — N400 Enlarged prostate without lower urinary tract symptoms: Secondary | ICD-10-CM | POA: Insufficient documentation

## 2020-10-24 DIAGNOSIS — I219 Acute myocardial infarction, unspecified: Secondary | ICD-10-CM | POA: Insufficient documentation

## 2020-10-24 DIAGNOSIS — I519 Heart disease, unspecified: Secondary | ICD-10-CM | POA: Insufficient documentation

## 2020-10-24 DIAGNOSIS — H269 Unspecified cataract: Secondary | ICD-10-CM | POA: Insufficient documentation

## 2020-10-24 DIAGNOSIS — I10 Essential (primary) hypertension: Secondary | ICD-10-CM | POA: Insufficient documentation

## 2020-10-24 DIAGNOSIS — I252 Old myocardial infarction: Secondary | ICD-10-CM | POA: Insufficient documentation

## 2020-10-24 DIAGNOSIS — M25422 Effusion, left elbow: Secondary | ICD-10-CM | POA: Insufficient documentation

## 2020-10-24 DIAGNOSIS — R350 Frequency of micturition: Secondary | ICD-10-CM | POA: Insufficient documentation

## 2020-10-25 ENCOUNTER — Encounter: Payer: Self-pay | Admitting: Cardiology

## 2020-10-25 ENCOUNTER — Other Ambulatory Visit: Payer: Self-pay

## 2020-10-25 ENCOUNTER — Ambulatory Visit (INDEPENDENT_AMBULATORY_CARE_PROVIDER_SITE_OTHER): Payer: Medicare Other | Admitting: Cardiology

## 2020-10-25 VITALS — BP 148/72 | HR 73 | Ht 72.0 in | Wt 152.0 lb

## 2020-10-25 DIAGNOSIS — I10 Essential (primary) hypertension: Secondary | ICD-10-CM | POA: Diagnosis not present

## 2020-10-25 DIAGNOSIS — I2510 Atherosclerotic heart disease of native coronary artery without angina pectoris: Secondary | ICD-10-CM

## 2020-10-25 DIAGNOSIS — R519 Headache, unspecified: Secondary | ICD-10-CM

## 2020-10-25 DIAGNOSIS — I951 Orthostatic hypotension: Secondary | ICD-10-CM | POA: Diagnosis not present

## 2020-10-25 DIAGNOSIS — G8929 Other chronic pain: Secondary | ICD-10-CM

## 2020-10-25 NOTE — Addendum Note (Signed)
Addended by: Senaida Ores on: 10/25/2020 01:32 PM   Modules accepted: Orders

## 2020-10-25 NOTE — Patient Instructions (Signed)
Medication Instructions:  Your physician has recommended you make the following change in your medication:  STOP: lasix   *If you need a refill on your cardiac medications before your next appointment, please call your pharmacy*   Lab Work: None. If you have labs (blood work) drawn today and your tests are completely normal, you will receive your results only by: Marland Kitchen MyChart Message (if you have MyChart) OR . A paper copy in the mail If you have any lab test that is abnormal or we need to change your treatment, we will call you to review the results.   Testing/Procedures: None   Follow-Up: At [facility], you and your health needs are our priority.  As part of our continuing mission to provide you with exceptional heart care, we have created designated Provider Care Teams.  These Care Teams include your primary Cardiologist (physician) and Advanced Practice Providers (APPs -  Physician Assistants and Nurse Practitioners) who all work together to provide you with the care you need, when you need it.  We recommend signing up for the patient portal called "MyChart".  Sign up information is provided on this After Visit Summary.  MyChart is used to connect with patients for Virtual Visits (Telemedicine).  Patients are able to view lab/test results, encounter notes, upcoming appointments, etc.  Non-urgent messages can be sent to your provider as well.   To learn more about what you can do with MyChart, go to [email or web address].    Your next appointment:   6 month(s)  The format for your next appointment:   In Person  Provider:   Jenne Campus, MD   Other Instructions

## 2020-10-25 NOTE — Progress Notes (Signed)
Cardiology Office Note:    Date:  10/25/2020   ID:  George Barnett, DOB [birth year], MRN [id number]  PCP:  Lorrene Reid, PA-C  Cardiologist:  Jenne Campus, MD    Referring MD: Lorrene Reid, PA-C   Chief Complaint  Patient presents with  . Follow-up  I am doing fine cardiac wise   History of Present Illness:    George Barnett is a 86 y.o. male with past medical history significant for coronary artery disease in 1999 he suffered from small myocardial infarction that required stenting.  I do not have details about the dose events.  In June 2020 we did stress test which showed no evidence of ischemia.  Originally he was referred to me because of orthostatic hypotension.  That being successfully managed as he seems to be doing well.  The biggest problem he have is a chronic pain in his scalp he did have quite extensive dermatological evaluation with not much success.  That bothers him a lot he afraid to go outside during the summer because of sun will bother his scalp during the winter he cannot wear any hot and that why he does not want to go outside he spent majority of time sitting at home watching TV or playing with a computer  Past Medical History:  Diagnosis Date  . Abdominal pain 03/25/2017  . 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 Atypical chest pain 05/07/2019  . Benign essential tremor 12/08/2019  . Bilateral lower extremity edema 11/17/2017  . Brittle diabetes mellitus ([place]) 01/12/2020  . CAD (coronary artery disease) 03/25/2017  . Cancer [facility])    SKIN CANCER   . Cataract   . Chronic head/ "scalp" pain 01/12/2020   Per patient the pain of his scalp is like "bees stinging you all over on your head "he says the slightest touch to the area causes significant pain/discomfort  . Costochondritis 05/05/2017   --->pt had mentioned costochondritis at his last office visit with his former PCP prior to transferring care to [place]:  [name], MD - 02/24/2017 10:30 AM EDT Formatting of this note may be different from the original. Demon is a 86 y.o. male who is here for a follow up visit.  DM: He is taking metformin and glimepiride and tolerates these well. His A1C was 7.1 on recent labs.  HTN: He  . Decreased activity 12/08/2019  . Dehydration- mild 12/08/2019  . Diabetes mellitus without complication ([place])   . Edema, peripheral 11/22/2014  . Elbow effusion, left   . Erosive pustular dermatosis of scalp 12/08/2019  . Folliculitis- scalp; txed by Derm- Dr Jarome Matin 07/27/2018  . Frequency of urination   . GAD (generalized anxiety disorder) 12/10/2019  . GERD (gastroesophageal reflux disease)   . Glossodynia 05/05/2017   [name], MD - 11/22/2016 2:15 PM EST Formatting of this note may be different from the original. Otolaryngology Initial Consultation Note  Requesting Attending Physician: [name], MD Service Requesting Consult: No service for patient encounter.  History of Present Illness:   The patient is being seen in consultation with [name], MD for the evaluation of tongu  . Heart attack ([place])   . Heart disease   . High risk medication use 01/15/2019  . Hypertension   . Hypertension associated with diabetes ([place]) 08/05/2017  . Microalbuminuria due to type 2 diabetes mellitus ([place]) 08/05/2017  . Mixed diabetic hyperlipidemia associated with type 2 diabetes mellitus ([place]) 08/05/2017  .  Orthostatic hypotension 11/05/2018  . Primary osteoarthritis of right shoulder 01/24/2015  . Prostate enlargement   . Recurrent right inguinal hernia 05/18/2019  . RLS (restless legs syndrome) 06/17/2017  . Snoring 03/25/2017  . Stage 3 chronic kidney disease ([place]) 04/22/2017  . Stage 4 chronic kidney disease ([place]) 09/08/2020  . Stress and adjustment reaction 01/12/2020  . Tremor of both hands-  both wiht rest and activity; acutely worse 11/27/2018  . Tremors of nervous system   . Type 2 diabetes mellitus with  stage 3 chronic kidney disease, without long-term current use of insulin ([place]) 05/05/2017  . Urinary retention due to benign prostatic hyperplasia 01/25/2015  . Venous insufficiency of both lower extremities 11/27/2018  . White coat syndrome with diagnosis of hypertension 03/10/2020    Past Surgical History:  Procedure Laterality Date  . CARDIAC CATHETERIZATION     1999  . cardio stint    . HERNIA REPAIR     BILAT   . INGUINAL HERNIA REPAIR  05/18/2019  . INGUINAL HERNIA REPAIR Right 05/18/2019   Procedure: REPAIR RIGHT INGUINAL HERNIA WITH MESH;  Surgeon: Erroll Luna, MD;  Location: [place];  Service: General;  Laterality: Right;  . MOHS SURGERY     BILAT EARS   . PROSTATE BIOPSY    . TOTAL SHOULDER ARTHROPLASTY Right 01/24/2015   Procedure: RIGHT TOTAL SHOULDER ARTHROPLASTY;  Surgeon: Marchia Bond, MD;  Location: [place];  Service: Orthopedics;  Laterality: Right;    Current Medications: Current Meds  Medication Sig  . aspirin 81 MG tablet Take 81 mg by mouth every evening.   Marland Kitchen atorvastatin (LIPITOR) 10 MG tablet TAKE 1 TABLET EVERY NIGHT  BEFORE BED.  . Cobalamine Combinations (Z-61 + FOLIC ACID PO) Take 1 capsule by mouth every evening.   . Fluocinolone Acetonide Body 0.01 % OIL Apply 1 Pump topically as needed.  . furosemide (LASIX) 20 MG tablet Take 1 tablet (20 mg total) by mouth daily.  Marland Kitchen glimepiride (AMARYL) 1 MG tablet Take 1 tablet (1 mg total) by mouth daily with breakfast.  . imiquimod (ALDARA) 5 % cream Apply 1 application topically as needed.  Marland Kitchen losartan (COZAAR) 25 MG tablet Take 1 tablet (25 mg total) by mouth daily.  . metFORMIN (GLUCOPHAGE) 500 MG tablet Take 1 tablet (500 mg total) by mouth 2 (two) times daily with a meal.  . Polyethyl Glycol-Propyl Glycol (SYSTANE OP) Place 1 drop into both eyes daily.  . polyethylene glycol (MIRALAX / GLYCOLAX) packet Take 17 g by mouth daily as needed for mild constipation.   . Probiotic Product (PROBIOTIC DAILY PO) Take 1 capsule  by mouth daily with lunch.   . tacrolimus (PROTOPIC) 0.1 % ointment Apply 1 application topically 2 (two) times daily.  . Vitamin D, Ergocalciferol, (DRISDOL) 1.25 MG (50000 UNIT) CAPS capsule take one tab every 10d,  . [DISCONTINUED] mycophenolate (CELLCEPT) 500 MG tablet Take 500 mg by mouth in the morning and at bedtime.     Allergies:   Exenatide, Meperidine and related, Nadolol, and Sitagliptin phosphate [sitagliptin]   Social History   Socioeconomic History  . Marital status: Married    Spouse name: Not on file  . Number of children: Not on file  . Years of education: Not on file  . Highest education level: Not on file  Occupational History  . Not on file  Tobacco Use  . Smoking status: Never Smoker  . Smokeless tobacco: Never Used  Vaping Use  . Vaping Use: Never used  Substance and Sexual Activity  . Alcohol use: No  . Drug use: No  . Sexual activity: Not on file  Other Topics Concern  . Not on file  Social History Narrative  . Not on file   Social Determinants of Health   Financial Resource Strain:   . Difficulty of Paying Living Expenses: Not on file  Food Insecurity:   . Worried About Charity fundraiser in the Last Year: Not on file  . Ran Out of Food in the Last Year: Not on file  Transportation Needs:   . Lack of Transportation (Medical): Not on file  . Lack of Transportation (Non-Medical): Not on file  Physical Activity:   . Days of Exercise per Week: Not on file  . Minutes of Exercise per Session: Not on file  Stress:   . Feeling of Stress : Not on file  Social Connections:   . Frequency of Communication with Friends and Family: Not on file  . Frequency of Social Gatherings with Friends and Family: Not on file  . Attends Religious Services: Not on file  . Active Member of Clubs or Organizations: Not on file  . Attends Archivist Meetings: Not on file  . Marital Status: Not on file     Family History: The patient's family history  includes Hypertension in his brother. ROS:   Please see the history of present illness.    All 14 point review of systems negative except as described per history of present illness  EKGs/Labs/Other Studies Reviewed:      Recent Labs: 12/03/2019: TSH 2.290 03/07/2020: Hemoglobin 12.8; Magnesium 1.9; Platelets 176 09/06/2020: ALT 15; BUN 28; Creatinine, Ser 1.30; Potassium 4.7; Sodium 142  Recent Lipid Panel    Component Value Date/Time   CHOL 130 12/03/2019 0837   TRIG 55 12/03/2019 0837   HDL 69 12/03/2019 0837   CHOLHDL 1.9 12/03/2019 0837   LDLCALC 49 12/03/2019 0837    Physical Exam:    VS:  BP (!) 148/72 (BP Location: Right Arm, Patient Position: Sitting)   Pulse 73   Ht 6' (1.829 m)   Wt 152 lb (68.9 kg)   SpO2 97%   BMI 20.61 kg/m     Wt Readings from Last 3 Encounters:  10/25/20 152 lb (68.9 kg)  09/06/20 156 lb 6.4 oz (70.9 kg)  06/09/20 156 lb 6.4 oz (70.9 kg)     GEN:  Well nourished, well developed in no acute distress HEENT: Normal NECK: No JVD; No carotid bruits LYMPHATICS: No lymphadenopathy CARDIAC: RRR, no murmurs, no rubs, no gallops RESPIRATORY:  Clear to auscultation without rales, wheezing or rhonchi  ABDOMEN: Soft, non-tender, non-distended MUSCULOSKELETAL:  No edema; No deformity  SKIN: Warm and dry LOWER EXTREMITIES: no swelling NEUROLOGIC:  Alert and oriented x 3 PSYCHIATRIC:  Normal affect   ASSESSMENT:    1. White coat syndrome with diagnosis of hypertension   2. Orthostatic hypotension   3. Coronary artery disease involving native coronary artery of native heart without angina pectoris   4. Chronic head/ "scalp" pain    PLAN:    In order of problems listed above:  1. [place] hypertension: He brought numbers to me of his blood pressure usually is actually low he still described to have some situation when he gets up very quickly get dizzy and weak.  Also when his blood pressure is low 87 systolic he will feel very tired I will  discontinue his diuretic I told him to drink  extra fluids when he is able to blow blood pressure. 2. Orthostatic hypotension will discontinue diuretic. 3. Coronary disease stable we will continue present management. 4. Dyslipidemia: He is on statin Lipitor 10 mg which I will continue, I did review his K PN which show me LDL 49 HDL 69 does have reasonable number we will continue present management.   Medication Adjustments/Labs and Tests Ordered: Current medicines are reviewed at length with the patient today.  Concerns regarding medicines are outlined above.  No orders of the defined types were placed in this encounter.  Medication changes: No orders of the defined types were placed in this encounter.   Signed, Park Liter, MD, [facility] 10/25/2020 1:19 PM    [place]

## 2020-11-02 DIAGNOSIS — C44222 Squamous cell carcinoma of skin of right ear and external auricular canal: Secondary | ICD-10-CM | POA: Diagnosis not present

## 2020-11-09 DIAGNOSIS — Z4802 Encounter for removal of sutures: Secondary | ICD-10-CM | POA: Diagnosis not present

## 2020-11-20 LAB — HM DIABETES EYE EXAM

## 2020-11-23 DIAGNOSIS — L570 Actinic keratosis: Secondary | ICD-10-CM | POA: Diagnosis not present

## 2020-11-23 DIAGNOSIS — Z5181 Encounter for therapeutic drug level monitoring: Secondary | ICD-10-CM | POA: Diagnosis not present

## 2020-11-23 DIAGNOSIS — L988 Other specified disorders of the skin and subcutaneous tissue: Secondary | ICD-10-CM | POA: Diagnosis not present

## 2020-11-27 ENCOUNTER — Encounter: Payer: Self-pay | Admitting: Physician Assistant

## 2020-11-30 DIAGNOSIS — C44222 Squamous cell carcinoma of skin of right ear and external auricular canal: Secondary | ICD-10-CM | POA: Diagnosis not present

## 2020-11-30 DIAGNOSIS — Z483 Aftercare following surgery for neoplasm: Secondary | ICD-10-CM | POA: Diagnosis not present

## 2020-12-14 ENCOUNTER — Ambulatory Visit: Payer: Medicare Other | Admitting: Family Medicine

## 2020-12-21 DIAGNOSIS — K862 Cyst of pancreas: Secondary | ICD-10-CM | POA: Diagnosis not present

## 2020-12-21 DIAGNOSIS — K5900 Constipation, unspecified: Secondary | ICD-10-CM | POA: Diagnosis not present

## 2021-01-04 ENCOUNTER — Encounter (INDEPENDENT_AMBULATORY_CARE_PROVIDER_SITE_OTHER): Payer: Self-pay

## 2021-01-08 ENCOUNTER — Encounter: Payer: Self-pay | Admitting: Family Medicine

## 2021-01-08 ENCOUNTER — Other Ambulatory Visit: Payer: Self-pay

## 2021-01-08 ENCOUNTER — Ambulatory Visit (INDEPENDENT_AMBULATORY_CARE_PROVIDER_SITE_OTHER): Payer: Medicare Other | Admitting: Family Medicine

## 2021-01-08 VITALS — BP 144/70 | HR 141 | Temp 97.0°F | Ht 72.0 in | Wt 156.0 lb

## 2021-01-08 DIAGNOSIS — G250 Essential tremor: Secondary | ICD-10-CM

## 2021-01-08 DIAGNOSIS — L988 Other specified disorders of the skin and subcutaneous tissue: Secondary | ICD-10-CM | POA: Diagnosis not present

## 2021-01-08 DIAGNOSIS — E1159 Type 2 diabetes mellitus with other circulatory complications: Secondary | ICD-10-CM

## 2021-01-08 DIAGNOSIS — I2510 Atherosclerotic heart disease of native coronary artery without angina pectoris: Secondary | ICD-10-CM | POA: Diagnosis not present

## 2021-01-08 DIAGNOSIS — E782 Mixed hyperlipidemia: Secondary | ICD-10-CM | POA: Diagnosis not present

## 2021-01-08 DIAGNOSIS — E559 Vitamin D deficiency, unspecified: Secondary | ICD-10-CM | POA: Diagnosis not present

## 2021-01-08 DIAGNOSIS — N183 Chronic kidney disease, stage 3 unspecified: Secondary | ICD-10-CM

## 2021-01-08 DIAGNOSIS — I152 Hypertension secondary to endocrine disorders: Secondary | ICD-10-CM

## 2021-01-08 DIAGNOSIS — E1169 Type 2 diabetes mellitus with other specified complication: Secondary | ICD-10-CM

## 2021-01-08 DIAGNOSIS — E1122 Type 2 diabetes mellitus with diabetic chronic kidney disease: Secondary | ICD-10-CM | POA: Diagnosis not present

## 2021-01-08 NOTE — Progress Notes (Signed)
[facility] [address] [address] Dept: [phone] Dept Fax: [phone]  New Patient Office Visit  Subjective:    Patient ID: Nathon Stefanski, male    DOB: August 06, 1933, 87 y.o..   MRN: [id number]  Chief Complaint  Patient presents with  . Establish Care    New patient/establish care, no concerns.     History of Present Illness:  Patient is in today to establish care. Mr. Dahlem is married and currently lives with his wife in a Secretary/administrator in [place]. 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 He has 3 children, with the closest one living in [place], [place] and [place].  Mr. Samons has multiple chronic health issues. The issue that is giving him the greatest trouble currently is a chronic scalp condition that has been present for the past 2+ years. He is currently being managed by a dermatology practice in [place]. There have been multiple therapies tried for this, including fluocinolone oil, imiquimod, tacrolimus, and Cellcept. He notes that nothing seems to be maanging this at this point. He continues to have stinging to sharp pains in his scalp much of the time. He does have a follow-up with Dermatology this week.  Mr. Dillenburg has a history of Type 2 diabetes with Stage 3a CKD. He is treated with metformin and glimeperide for this DM and on losartan for HTN and renal protection. He is on Lipitor for hyperlipidemia. He has a past history of CAD and had a stent placed after a small MI in 1999.  Mr. Roper has a chronic tremor condition. He notes this is worsening with time. He has an upcoming appointment with Dr. Rexene Alberts (neurology) for evaluation.  Past Medical History: Patient Active Problem List   Diagnosis Date Noted  . Squamous cell carcinoma of antihelix, right 11/02/2020  . Prostate enlargement   . Heart attack ([place])   . Elbow effusion, left   . Cataract   .  White coat syndrome with diagnosis of hypertension 03/10/2020  . Stress and adjustment reaction 01/12/2020  . Chronic head/ "scalp" pain 01/12/2020  . GAD (generalized anxiety disorder) 12/10/2019  . 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 Erosive pustular dermatosis of scalp 12/08/2019  . Benign essential tremor 12/08/2019  . Vitamin D deficiency 12/08/2019  . Decreased activity 12/08/2019  . Recurrent right inguinal hernia 05/18/2019  . Atypical chest pain 05/07/2019  . High risk medication use 01/15/2019  . Venous insufficiency of both lower extremities 11/27/2018  . Orthostatic hypotension 11/05/2018  . Folliculitis- scalp; txed by Derm- Dr Jarome Matin 07/27/2018  . Bilateral lower extremity edema 11/17/2017  . Hypertension associated with diabetes ([place]) 08/05/2017  . Mixed diabetic hyperlipidemia associated with type 2 diabetes mellitus ([place]) 08/05/2017  . Microalbuminuria due to type 2 diabetes mellitus ([place]) 08/05/2017  . RLS (restless legs syndrome) 06/17/2017  . GERD (gastroesophageal reflux disease) 05/05/2017  . Glossodynia 05/05/2017  . Costochondritis 05/05/2017  . Type 2 diabetes mellitus with stage 3 chronic kidney disease, without long-term current use of insulin ([place]) 05/05/2017  . Stage 3 chronic kidney disease ([place]) 04/22/2017  . CAD (coronary artery disease) 03/25/2017  . Snoring 03/25/2017  . Urinary retention due to benign prostatic hyperplasia 01/25/2015  . Primary osteoarthritis of right shoulder 01/24/2015   Past Surgical History:  Procedure Laterality Date  . CARDIAC CATHETERIZATION     1999  . cardio stint    .  HERNIA REPAIR     BILAT   . INGUINAL HERNIA REPAIR  05/18/2019  . INGUINAL HERNIA REPAIR Right 05/18/2019   Procedure: REPAIR RIGHT INGUINAL HERNIA WITH MESH;  Surgeon: Erroll Luna, MD;  Location: [place];  Service: General;  Laterality: Right;  . MOHS SURGERY     BILAT EARS   . PROSTATE BIOPSY     . TOTAL SHOULDER ARTHROPLASTY Right 01/24/2015   Procedure: RIGHT TOTAL SHOULDER ARTHROPLASTY;  Surgeon: Marchia Bond, MD;  Location: [place];  Service: Orthopedics;  Laterality: Right;   Family History  Problem Relation Age of Onset  . Hypertension Brother    Outpatient Medications Prior to Visit  Medication Sig Dispense Refill  . aspirin 81 MG tablet Take 81 mg by mouth every evening.     Marland Kitchen atorvastatin (LIPITOR) 10 MG tablet TAKE 1 TABLET EVERY NIGHT  BEFORE BED. 90 tablet 1  . Cobalamine Combinations (K-44 + FOLIC ACID PO) Take 1 capsule by mouth every evening.     . Fluocinolone Acetonide Body 0.01 % OIL Apply 1 Pump topically as needed.    Marland Kitchen glimepiride (AMARYL) 1 MG tablet Take 1 tablet (1 mg total) by mouth daily with breakfast. 90 tablet 1  . losartan (COZAAR) 25 MG tablet Take 1 tablet (25 mg total) by mouth daily. 90 tablet 1  . metFORMIN (GLUCOPHAGE) 500 MG tablet Take 1 tablet (500 mg total) by mouth 2 (two) times daily with a meal. 180 tablet 1  . mycophenolate (CELLCEPT) 500 MG tablet Take 1,000 mg by mouth 2 (two) times daily.    Vladimir Faster Glycol-Propyl Glycol (SYSTANE OP) Place 1 drop into both eyes daily.    . polyethylene glycol (MIRALAX / GLYCOLAX) packet Take 17 g by mouth daily as needed for mild constipation.     . Probiotic Product (PROBIOTIC DAILY PO) Take 1 capsule by mouth daily with lunch.     . silver sulfADIAZINE (SILVADENE) 1 % cream Apply topically 2 (two) times daily.    . tacrolimus (PROTOPIC) 0.1 % ointment Apply 1 application topically 2 (two) times daily.    . Vitamin D, Ergocalciferol, (DRISDOL) 1.25 MG (50000 UNIT) CAPS capsule take one tab every 10d, 12 capsule 3  . imiquimod (ALDARA) 5 % cream Apply 1 application topically as needed.     No facility-administered medications prior to visit.   Allergies  Allergen Reactions  . Exenatide Nausea And Vomiting  . Meperidine And Related Other (See Comments)    Red streak, that happened once while  having an IV  . Nadolol Other (See Comments)    Numbness in fingers  . Sitagliptin Phosphate [Sitagliptin] Other (See Comments)    Weight loss    Objective:   Today's Vitals   01/08/21 1346  BP: (!) 144/70  Pulse: (!) 141  Temp: (!) 97 F (36.1 C)  TempSrc: Temporal  SpO2: 92%  Weight: 156 lb (70.8 kg)  Height: 6' (1.829 m)   Body mass index is 21.16 kg/m.   General: Well developed, well nourished. No acute distress. Scalp: There is a broad area across the apex of the scalp with scaly, pustular surface erosions. Ears: The left external ear appears normal. However, the right appears to have the same rash as is present   on his scalp. Neuro: Rolling tremor noted of both hands, but this disappears with rest. No cogwheel rigidity noted of the   upper extremities. Psych: Alert and oriented. Normal mood and affect.  Health Maintenance Due  Topic Date Due  . TETANUS/TDAP  04/25/2020     Assessment & Plan:   1. Type 2 diabetes mellitus with stage 3 chronic kidney disease, without long-term current use of insulin, unspecified whether stage 3a or 3b CKD ([place]) I will check annual DM labs to monitor for co-morbidities and to assess current DM control.  - Microalbumin / creatinine urine ratio - Basic metabolic panel - Hemoglobin A1c - Urinalysis, Routine w reflex microscopic  2. Mixed diabetic hyperlipidemia associated with type 2 diabetes mellitus ([place]) Will check current level of control.  - Lipid panel  3. Hypertension associated with diabetes ([place]) At goal. Will follow.  4. Coronary artery disease involving native coronary artery of native heart without angina pectoris On aspirin and a statin.  5. Benign essential tremor The tremor is consistent with an essential tremor. Agree with neurology visit that is planned, as this does interfere with some of his ADLS.  6. Vitamin D deficiency Recommend he use Vitamin D3 5000 units OTC for maintenance.  7. Erosive pustular  dermatosis of scalp Continue to follow with Dermatology.  Haydee Salter, MD

## 2021-01-09 ENCOUNTER — Ambulatory Visit: Payer: Medicare Other | Admitting: Neurology

## 2021-01-09 ENCOUNTER — Ambulatory Visit: Payer: Medicare Other | Admitting: Physician Assistant

## 2021-01-09 DIAGNOSIS — Z5181 Encounter for therapeutic drug level monitoring: Secondary | ICD-10-CM | POA: Diagnosis not present

## 2021-01-09 DIAGNOSIS — Z79899 Other long term (current) drug therapy: Secondary | ICD-10-CM | POA: Diagnosis not present

## 2021-01-09 DIAGNOSIS — Z85828 Personal history of other malignant neoplasm of skin: Secondary | ICD-10-CM | POA: Diagnosis not present

## 2021-01-09 DIAGNOSIS — Z872 Personal history of diseases of the skin and subcutaneous tissue: Secondary | ICD-10-CM | POA: Diagnosis not present

## 2021-01-09 DIAGNOSIS — L988 Other specified disorders of the skin and subcutaneous tissue: Secondary | ICD-10-CM | POA: Diagnosis not present

## 2021-01-09 LAB — URINALYSIS, ROUTINE W REFLEX MICROSCOPIC
Bilirubin Urine: NEGATIVE
Hgb urine dipstick: NEGATIVE
Ketones, ur: NEGATIVE
Leukocytes,Ua: NEGATIVE
Nitrite: NEGATIVE
Specific Gravity, Urine: 1.025 (ref 1.000–1.030)
Urine Glucose: NEGATIVE
Urobilinogen, UA: 0.2 (ref 0.0–1.0)
pH: 6 (ref 5.0–8.0)

## 2021-01-09 LAB — LIPID PANEL
Cholesterol: 94 mg/dL (ref 0–200)
HDL: 52.2 mg/dL (ref >39.00)
LDL Cholesterol: 30 mg/dL (ref 0–99)
NonHDL: 41.56
Total CHOL/HDL Ratio: 2
Triglycerides: 57 mg/dL (ref 0.0–149.0)
VLDL: 11.4 mg/dL (ref 0.0–40.0)

## 2021-01-09 LAB — BASIC METABOLIC PANEL
BUN: 33 mg/dL — ABNORMAL HIGH (ref 6–23)
CO2: 28 mEq/L (ref 19–32)
Calcium: 9.5 mg/dL (ref 8.4–10.5)
Chloride: 104 mEq/L (ref 96–112)
Creatinine, Ser: 1.26 mg/dL (ref 0.40–1.50)
GFR: 51.79 mL/min — ABNORMAL LOW (ref >60.00)
Glucose, Bld: 164 mg/dL — ABNORMAL HIGH (ref 70–99)
Potassium: 5.1 mEq/L (ref 3.5–5.1)
Sodium: 140 mEq/L (ref 135–145)

## 2021-01-09 LAB — MICROALBUMIN / CREATININE URINE RATIO
Creatinine,U: 104.3 mg/dL
Microalb Creat Ratio: 1.4 mg/g (ref 0.0–30.0)
Microalb, Ur: 1.5 mg/dL (ref 0.0–1.9)

## 2021-01-09 LAB — HEMOGLOBIN A1C: Hgb A1c MFr Bld: 7.2 % — ABNORMAL HIGH (ref 4.6–6.5)

## 2021-01-15 DIAGNOSIS — H01005 Unspecified blepharitis left lower eyelid: Secondary | ICD-10-CM | POA: Diagnosis not present

## 2021-01-15 DIAGNOSIS — H01002 Unspecified blepharitis right lower eyelid: Secondary | ICD-10-CM | POA: Diagnosis not present

## 2021-01-16 ENCOUNTER — Other Ambulatory Visit: Payer: Medicare Other

## 2021-01-30 ENCOUNTER — Ambulatory Visit (INDEPENDENT_AMBULATORY_CARE_PROVIDER_SITE_OTHER): Payer: Medicare Other | Admitting: Neurology

## 2021-01-30 ENCOUNTER — Encounter: Payer: Self-pay | Admitting: Neurology

## 2021-01-30 VITALS — BP 181/79 | HR 80 | Ht 73.0 in | Wt 155.5 lb

## 2021-01-30 DIAGNOSIS — I2510 Atherosclerotic heart disease of native coronary artery without angina pectoris: Secondary | ICD-10-CM | POA: Diagnosis not present

## 2021-01-30 DIAGNOSIS — G250 Essential tremor: Secondary | ICD-10-CM

## 2021-01-30 DIAGNOSIS — N183 Chronic kidney disease, stage 3 unspecified: Secondary | ICD-10-CM

## 2021-01-30 DIAGNOSIS — I951 Orthostatic hypotension: Secondary | ICD-10-CM

## 2021-01-30 MED ORDER — GABAPENTIN 100 MG PO CAPS: 100.0000 mg | ORAL_CAPSULE | Freq: Every day | ORAL | 3 refills | Status: DC

## 2021-01-30 NOTE — Patient Instructions (Addendum)
It was good to see you again today.  I am sorry to hear that your tremor has been disabling and has become worse.  It may tie in with your recent skin cancer treatments and as you have experienced pain on your scalp your tremor may have flared up.   I do not see any signs of Parkinson's disease.  Please remember, that any kind of tremor may be exacerbated by anxiety, anger, nervousness, excitement, dehydration, sleep deprivation, by caffeine, thyroid disease, pain, and low blood sugar values or blood sugar fluctuations. Some medications, especially some antidepressants and lithium can cause or exacerbate tremors.   There are very few medications that symptomatically help with tremor reduction, none are without potential side effects.   I think a beta-blocker would not be a good choice for you as you do have quite a bit of blood pressure drop when you change positions.  You have felt at times lightheaded and when your blood sugar fluctuates you also feel faint at times.  I would recommend staying clear of a trial of a beta-blocker for your tremor.  We could consider a low-dose of Mysoline 50 mg strength, such as half a pill at bedtime in the near future.  It is cleared through the kidneys and may need adjustment for more advanced kidney impairment but for your mild kidney impairment it would be okay to try.  Please be mindful that this medicine can cause grogginess and balance impairment.   For now, as discussed, we will try you on a low-dose of gabapentin.  While it is not considered approved a first-line for tremor control, sometimes it helps.  In addition, because it can help reduce nerve pain, it may help with your scalp pain.  Start Neurontin (gabapentin) 100 mg strength: Take 1 pill once in the evening.  The most common side effects reported are sedation or sleepiness. Rare side effects include balance problems, confusion.  At this lower dose, it is hopefully going to be well-tolerated.  Please  give [place] an update anytime.  You can call us during our regular hours and also email [place] through MyChart if you like.  Follow-up in this clinic in about 3 months.

## 2021-01-30 NOTE — Progress Notes (Signed)
Subjective:    Patient ID: George Barnett is a 87 y.o. male.  HPI     Interim history:   George Barnett is an 87 year old right-handed gentleman with an underlying medical history of diabetes, hypertension, chronic kidney disease, hyperlipidemia, venous insufficiency, and history of orthostatic hypotension, who Presents for reevaluation of his hand tremors.  The patient is accompanied by his wife today.  I first met him on 01/14/2019 at the request of his primary care physician, at which time he reported a several year history of hand tremors, as long as over 50 years.  He did not have any telltale signs of parkinsonism but I did not favor any symptomatic treatment such as primidone or propranolol secondary to concern for side effects including sedation and balance issues.  We talked about tremor triggers.  He did report quite a bit of stress at the time.   Today, 01/30/2021: He reports that his tremor has become worse in the past few months.  He reports that he noticed his tremor flareup after he started medications for his skin cancers.  He has to take topical treatment twice a day with cleaning of the scalp tissue which his wife helps him with.  However, treatment is very painful especially when he first started it and he noticed a significant flare in his hand tremor.  His tremors have become worse over time, it is difficult for him to hold things especially a cup of coffee, it tends to spill.  His handwriting has been much worse.  He is not necessarily keen on taking any more medication than he is but is quite affected by his tremor.  He has also worried about Parkinson's disease. He does admit that he gets lightheaded at times when he stands up from a stooped or bending position.  His wife adds that because of blood sugar fluctuations, at times he feels faint.  The patient's allergies, current medications, family history, past medical history, past social history, past surgical history and problem  list were reviewed and updated as appropriate.    Previously:   01/14/19: (He) reports a long-standing history of hand tremors for many years. His wife reports that he had hand shaking when they got married which was over 50 years ago. He has no obvious family history of tremors. He has one brother. He has 3 kids. Neither parents had shaking as far as he recalls. He has no family history of Parkinson's disease but has worried about Parkinson's disease. In the past several months his tremor has become significantly worse. 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 He does endorse significant stressors lately, he worries about his wife's health. He has had significant issues with skin cancers and folliculitis. He has to have hernia surgery. He worries about his wife also, she had a stroke and she may need knee surgery. He has recently stopped taking glimepiride and has been keeping a log on his blood sugar values. His wife endorses that his blood sugar numbers have been higher. He has not had any recent low blood sugar values but does have jitteriness when he has low blood sugar and sweatiness on his forehead with low blood sugar. His hand tremors of bilateral, has affected his ability to drink from a cup and he avoids drinking hot beverages unless there is a limit on the cup. The tremor also interferes with his ability to write and to feed himself. He  is a nonsmoker and does not utilize any alcohol, drinks caffeine in the form of coffee, 1 cup daily and [facility] with lunch.   His Past Medical History Is Significant For: Past Medical History:  Diagnosis Date  . Abdominal pain 03/25/2017  . 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 Atypical chest pain 05/07/2019  . Benign essential tremor 12/08/2019  . Bilateral lower extremity edema 11/17/2017  . Brittle diabetes mellitus ([place]) 01/12/2020  . CAD  (coronary artery disease) 03/25/2017  . Cancer [facility])    SKIN CANCER   . Cataract   . Chronic head/ "scalp" pain 01/12/2020   Per patient the pain of his scalp is like "bees stinging you all over on your head "he says the slightest touch to the area causes significant pain/discomfort  . Costochondritis 05/05/2017   --->pt had mentioned costochondritis at his last office visit with his former PCP prior to transferring care to [place]:  [name], MD - 02/24/2017 10:30 AM EDT Formatting of this note may be different from the original. Adams is a 87 y.o. male who is here for a follow up visit.  DM: He is taking metformin and glimepiride and tolerates these well. His A1C was 7.1 on recent labs.  HTN: He  . Decreased activity 12/08/2019  . Dehydration- mild 12/08/2019  . Diabetes mellitus without complication ([place])   . Edema, peripheral 11/22/2014  . Elbow effusion, left   . Erosive pustular dermatosis of scalp 12/08/2019  . Folliculitis- scalp; txed by Derm- Dr Jarome Matin 07/27/2018  . Frequency of urination   . GAD (generalized anxiety disorder) 12/10/2019  . GERD (gastroesophageal reflux disease)   . Glossodynia 05/05/2017   [name], MD - 11/22/2016 2:15 PM EST Formatting of this note may be different from the original. Otolaryngology Initial Consultation Note  Requesting Attending Physician: [name], MD Service Requesting Consult: No service for patient encounter.  History of Present Illness:   The patient is being seen in consultation with [name], MD for the evaluation of tongu  . Heart attack ([place])   . Heart disease   . High risk medication use 01/15/2019  . Hypertension   . Hypertension associated with diabetes ([place]) 08/05/2017  . Microalbuminuria due to type 2 diabetes mellitus ([place]) 08/05/2017  . Mixed diabetic hyperlipidemia associated with type 2 diabetes mellitus ([place]) 08/05/2017  . Orthostatic hypotension 11/05/2018  . Primary osteoarthritis of right  shoulder 01/24/2015  . Prostate enlargement   . Recurrent right inguinal hernia 05/18/2019  . RLS (restless legs syndrome) 06/17/2017  . Snoring 03/25/2017  . Stage 3 chronic kidney disease ([place]) 04/22/2017  . Stage 4 chronic kidney disease ([place]) 09/08/2020  . Stress and adjustment reaction 01/12/2020  . Tremor of both hands-  both wiht rest and activity; acutely worse 11/27/2018  . Tremors of nervous system   . Type 2 diabetes mellitus with stage 3 chronic kidney disease, without long-term current use of insulin ([place]) 05/05/2017  . Urinary retention due to benign prostatic hyperplasia 01/25/2015  . Venous insufficiency of both lower extremities 11/27/2018  . White coat syndrome with diagnosis of hypertension 03/10/2020    His Past Surgical History Is Significant For: Past Surgical History:  Procedure Laterality Date  . CARDIAC CATHETERIZATION     1999  . cardio stint    . HERNIA REPAIR     BILAT   . INGUINAL HERNIA REPAIR  05/18/2019  . INGUINAL HERNIA REPAIR Right  05/18/2019   Procedure: REPAIR RIGHT INGUINAL HERNIA WITH MESH;  Surgeon: Erroll Luna, MD;  Location: [facility];  Service: General;  Laterality: Right;  . MOHS SURGERY     BILAT EARS   . PROSTATE BIOPSY    . TOTAL SHOULDER ARTHROPLASTY Right 01/24/2015   Procedure: RIGHT TOTAL SHOULDER ARTHROPLASTY;  Surgeon: Marchia Bond, MD;  Location: [place];  Service: Orthopedics;  Laterality: Right;    His Family History Is Significant For: Family History  Problem Relation Age of Onset  . Hypertension Brother     His Social History Is Significant For: Social History   Socioeconomic History  . Marital status: Married    Spouse name: Not on file  . Number of children: Not on file  . Years of education: Not on file  . Highest education level: Not on file  Occupational History  . Not on file  Tobacco Use  . Smoking status: Never Smoker  . Smokeless tobacco: Never Used  Vaping Use  . Vaping Use: Never used  Substance and Sexual  Activity  . Alcohol use: No  . Drug use: No  . Sexual activity: Not on file  Other Topics Concern  . Not on file  Social History Narrative  . Not on file   Social Determinants of Health   Financial Resource Strain: Not on file  Food Insecurity: Not on file  Transportation Needs: Not on file  Physical Activity: Not on file  Stress: Not on file  Social Connections: Not on file    His Allergies Are:  Allergies  Allergen Reactions  . Exenatide Nausea And Vomiting  . Meperidine And Related Other (See Comments)    Red streak, that happened once while having an IV  . Nadolol Other (See Comments)    Numbness in fingers  . Sitagliptin Phosphate [Sitagliptin] Other (See Comments)    Weight loss   :   His Current Medications Are:  Outpatient Encounter Medications as of 01/30/2021  Medication Sig  . aspirin 81 MG tablet Take 81 mg by mouth every evening.   Marland Kitchen atorvastatin (LIPITOR) 10 MG tablet TAKE 1 TABLET EVERY NIGHT  BEFORE BED.  . Cobalamine Combinations (N-19 + FOLIC ACID PO) Take 1 capsule by mouth every evening.   . Fluocinolone Acetonide Body 0.01 % OIL Apply 1 Pump topically as needed.  Marland Kitchen glimepiride (AMARYL) 1 MG tablet Take 1 tablet (1 mg total) by mouth daily with breakfast.  . losartan (COZAAR) 25 MG tablet Take 1 tablet (25 mg total) by mouth daily.  . metFORMIN (GLUCOPHAGE) 500 MG tablet Take 1 tablet (500 mg total) by mouth 2 (two) times daily with a meal.  . mycophenolate (CELLCEPT) 500 MG tablet Take 1,000 mg by mouth 2 (two) times daily.  Vladimir Faster Glycol-Propyl Glycol (SYSTANE OP) Place 1 drop into both eyes daily.  . polyethylene glycol (MIRALAX / GLYCOLAX) packet Take 17 g by mouth daily as needed for mild constipation.   . Probiotic Product (PROBIOTIC DAILY PO) Take 1 capsule by mouth daily with lunch.   . silver sulfADIAZINE (SILVADENE) 1 % cream Apply topically 2 (two) times daily.  . tacrolimus (PROTOPIC) 0.1 % ointment Apply 1 application topically 2  (two) times daily.   No facility-administered encounter medications on file as of 01/30/2021.  :  Review of Systems:  Out of a complete 14 point review of systems, all are reviewed and negative with the exception of these symptoms as listed below: Review of Systems  Neurological:  RM 1 w/ wife. Last seen 01/14/2019. Tremors starting getting worse about 2-3 months ago when he started having treatment on scalp. Scalp treatments are painful at times he reports. Cannot hold a cup of coffee steady.    Objective:  Neurological Exam  Physical Exam Physical Examination:   Vitals:   01/30/21 0704  BP: (!) 181/79  Pulse: 80   On orthostatic testing: sitting 145/81 HR 69, Standing 120/65 Hr 73.  He denies any orthostatic lightheadedness at this time.  General Examination: The patient is a very pleasant 87 y.o. male in no acute distress. He appears well-developed and well-nourished and well groomed.   HEENT: Normocephalic, atraumatic, pupils are equal, round and reactive to light, extraocular tracking is well-preserved, he is status post cataract repairs, he has hearing aids.  He has no lip, neck or jaw tremor, no voice tremor.  Neck is supple with full range of passive motion.  Scalp is covered with a soft bandage. Airway examination reveals nonfocal findings, mild mouth dryness is noted. Tongue protrudes centrally and palate elevates symmetrically. He has no significant hypophonia or dysarthria. No carotid bruits.  Chest: Clear to auscultation without wheezing, rhonchi or crackles noted.  Heart: S1+S2+0, regular and normal without murmurs, rubs or gallops noted.   Abdomen: Soft, non-tender and non-distended with normal bowel sounds appreciated on auscultation.  Extremities: There is  trace edema in the distal lower extremities bilaterally.   Skin: Warm and dry, with chronic appearing bruising on his arms, and skin lesions on the scalp, mostly covered up with  bandage.  Musculoskeletal: exam reveals no obvious joint deformities, tenderness or joint swelling or erythema.   Neurologically:  Mental status: The patient is awake, alert and oriented in all 4 spheres. His immediate and remote memory, attention, language skills and fund of knowledge are appropriate. There is no evidence of aphasia, agnosia, apraxia or anomia. Speech is clear with normal prosody and enunciation. Thought process is linear. Mood is normal and affect is normal.  Cranial nerves II - XII are as described above under HEENT exam. In addition: shoulder shrug is normal with equal shoulder height noted. Motor exam: thin bulk, global strength is normal for age, he has a normal tone. He has a slight degree of resting tremor intermittently in both hands, left more than right, left on the mild side, minimal on the right side. He has a mild to moderate postural tremor in both hands and a mild action tremor in both hands, no significant lateralization.  Reflexes are 1+ in the upper extremities and trace in the lower extremities.    On 01/14/2019: On Archimedes spiral drawing he has slight trembling with the right hand which is his dominant hand, with the left hand he has moderate trembling. Handwriting with the right hand is difficult to read, on the smaller side and tremulous.  Fine motor skill testing: he has no significant decrement in amplitude with finger taps or foot taps. He has global mild fine motor dyscontrol. Cerebellar testing: No dysmetria or intention tremor on finger to nose testing. Heel to shin is unremarkable bilaterally. There is no truncal or gait ataxia.  Sensory exam: intact to light touch.  Gait, station and balance: He stands without difficulty, his posture is slightly stooped but appears appropriate for age.  He walks with preserved arm swing and no shuffling is noted.  Balance is overall slightly impaired, seems stable.  He turns slowly and cautiously.  He has no walking  aid.  Assessment and Plan:    In summary, George Barnett is a very pleasant 87 year old male with an underlying medical history of diabetes, hypertension, skin cancers, chronic kidney disease, hyperlipidemia, venous insufficiency, and history of orthostatic hypotension, who presents for follow-up consultation of his longstanding history of hand tremors with history and examination in keeping with essential tremor which has progressed over time.  He noticed a recent flare in his tremor since he started skin cancer treatment which has been quite painful and he has to change his dressing and clean his scalp twice a day and has to put a topical agent on.  This is painful every time.  He has a fairly stable to mildly progressed tremor on exam today with a resting component left more than right.  Nevertheless, I reassured him, that there is no telltale sign of Parkinson's disease or parkinsonism.  He is reassured in that regard.  We talked at length about first-line treatments for tremor.  Again, I do not believe he is a good candidate for beta-blocker given his significant systolic and diastolic blood pressure drop, history of lightheadedness and history of blood sugar fluctuations.  We talked about utilizing Mysoline and low-dose.  He does have a history of chronic kidney impairment in the mild range, we could use it with caution at a low dose such as 25 mg which is half a pill.  We also talked about his pain being a trigger for his tremors.  Sometimes we utilize gabapentin as an adjunct for tremors which is off label as so many uses of gabapentin are.  He is advised to try a low-dose of gabapentin at this time which may turn down his tremor but also alleviate some of his pain he experiences.  In turn, with pain reduction his tremor may go down a little bit.  He is agreeable to trying this.  We talked about this medication, potential side effects and expectations.  We will use 100 mg at night in the evening once  a day only for now.  If need be, in the near future we could try low-dose Mysoline but he is advised of the possibility of sedation and balance issues with Mysoline.  As far as his kidney function, taking 50 mg or 25 mg should not be a problem.  He is agreeable to trying gabapentin in low dose.  I provided a new prescription and detailed written instructions today.  He is encouraged to call us or email [place] anytime for an update or with questions.  He is advised to follow-up in his clinic routinely in 3 months, sooner if needed.  I answered all the questions today and the patient and his wife were in agreement.

## 2021-01-31 ENCOUNTER — Other Ambulatory Visit: Payer: Self-pay | Admitting: Physician Assistant

## 2021-01-31 DIAGNOSIS — E1159 Type 2 diabetes mellitus with other circulatory complications: Secondary | ICD-10-CM

## 2021-01-31 DIAGNOSIS — E1129 Type 2 diabetes mellitus with other diabetic kidney complication: Secondary | ICD-10-CM

## 2021-01-31 DIAGNOSIS — E1169 Type 2 diabetes mellitus with other specified complication: Secondary | ICD-10-CM

## 2021-01-31 DIAGNOSIS — E782 Mixed hyperlipidemia: Secondary | ICD-10-CM

## 2021-02-02 ENCOUNTER — Telehealth: Payer: Self-pay

## 2021-02-02 DIAGNOSIS — E1129 Type 2 diabetes mellitus with other diabetic kidney complication: Secondary | ICD-10-CM

## 2021-02-02 DIAGNOSIS — E782 Mixed hyperlipidemia: Secondary | ICD-10-CM

## 2021-02-02 DIAGNOSIS — E1159 Type 2 diabetes mellitus with other circulatory complications: Secondary | ICD-10-CM

## 2021-02-02 DIAGNOSIS — E1169 Type 2 diabetes mellitus with other specified complication: Secondary | ICD-10-CM

## 2021-02-02 MED ORDER — METFORMIN HCL 500 MG PO TABS: 500.0000 mg | ORAL_TABLET | Freq: Two times a day (BID) | ORAL | 1 refills | Status: DC

## 2021-02-02 MED ORDER — LOSARTAN POTASSIUM 25 MG PO TABS: 25.0000 mg | ORAL_TABLET | Freq: Every day | ORAL | 1 refills | Status: DC

## 2021-02-02 MED ORDER — ATORVASTATIN CALCIUM 10 MG PO TABS: ORAL_TABLET | ORAL | 1 refills | Status: DC

## 2021-02-02 NOTE — Telephone Encounter (Signed)
Last OV 01/08/2021 Last fill Lipitor 09/08/20  #90/1 Last fill Losartan 09/06/20  #90/1 Last fill Metformin 09/06/20 #180/1

## 2021-02-08 ENCOUNTER — Other Ambulatory Visit: Payer: Self-pay | Admitting: Physician Assistant

## 2021-02-08 DIAGNOSIS — E1122 Type 2 diabetes mellitus with diabetic chronic kidney disease: Secondary | ICD-10-CM

## 2021-02-08 DIAGNOSIS — N183 Chronic kidney disease, stage 3 unspecified: Secondary | ICD-10-CM

## 2021-02-14 ENCOUNTER — Other Ambulatory Visit: Payer: Self-pay | Admitting: Family Medicine

## 2021-02-14 DIAGNOSIS — N183 Chronic kidney disease, stage 3 unspecified: Secondary | ICD-10-CM

## 2021-02-14 DIAGNOSIS — E1122 Type 2 diabetes mellitus with diabetic chronic kidney disease: Secondary | ICD-10-CM

## 2021-02-14 MED ORDER — GLIMEPIRIDE 1 MG PO TABS: 1.0000 mg | ORAL_TABLET | Freq: Every day | ORAL | 1 refills | Status: DC

## 2021-02-14 NOTE — Telephone Encounter (Signed)
Patient is calling to get a refill on his Glimepiride. If approved, please send to [facility] and call him to let him know that it has been sent in.

## 2021-02-14 NOTE — Telephone Encounter (Signed)
Patient notified VIA phone. Dm/cma  

## 2021-02-14 NOTE — Telephone Encounter (Signed)
Refill request for:  Glimperide 1 mg LR 7/23/2, #90, 1 rf  (previous provider)  LOV 01/08/21 FOV  04/13/21  Please review and advise.  Thanks. Dm/cma

## 2021-03-01 DIAGNOSIS — Z23 Encounter for immunization: Secondary | ICD-10-CM | POA: Diagnosis not present

## 2021-03-08 DIAGNOSIS — L989 Disorder of the skin and subcutaneous tissue, unspecified: Secondary | ICD-10-CM | POA: Diagnosis not present

## 2021-03-08 DIAGNOSIS — Z872 Personal history of diseases of the skin and subcutaneous tissue: Secondary | ICD-10-CM | POA: Diagnosis not present

## 2021-03-08 DIAGNOSIS — L988 Other specified disorders of the skin and subcutaneous tissue: Secondary | ICD-10-CM | POA: Diagnosis not present

## 2021-03-08 DIAGNOSIS — Z85828 Personal history of other malignant neoplasm of skin: Secondary | ICD-10-CM | POA: Diagnosis not present

## 2021-03-08 DIAGNOSIS — L089 Local infection of the skin and subcutaneous tissue, unspecified: Secondary | ICD-10-CM | POA: Diagnosis not present

## 2021-03-12 DIAGNOSIS — H01001 Unspecified blepharitis right upper eyelid: Secondary | ICD-10-CM | POA: Diagnosis not present

## 2021-03-12 DIAGNOSIS — H02403 Unspecified ptosis of bilateral eyelids: Secondary | ICD-10-CM | POA: Diagnosis not present

## 2021-03-12 DIAGNOSIS — H01004 Unspecified blepharitis left upper eyelid: Secondary | ICD-10-CM | POA: Diagnosis not present

## 2021-04-13 ENCOUNTER — Ambulatory Visit (INDEPENDENT_AMBULATORY_CARE_PROVIDER_SITE_OTHER): Payer: Medicare Other | Admitting: Family Medicine

## 2021-04-13 ENCOUNTER — Encounter: Payer: Self-pay | Admitting: Family Medicine

## 2021-04-13 ENCOUNTER — Other Ambulatory Visit: Payer: Self-pay

## 2021-04-13 VITALS — BP 140/78 | HR 54 | Temp 97.8°F | Ht 73.0 in | Wt 159.2 lb

## 2021-04-13 DIAGNOSIS — E1159 Type 2 diabetes mellitus with other circulatory complications: Secondary | ICD-10-CM

## 2021-04-13 DIAGNOSIS — G250 Essential tremor: Secondary | ICD-10-CM | POA: Diagnosis not present

## 2021-04-13 DIAGNOSIS — N1831 Chronic kidney disease, stage 3a: Secondary | ICD-10-CM

## 2021-04-13 DIAGNOSIS — I152 Hypertension secondary to endocrine disorders: Secondary | ICD-10-CM

## 2021-04-13 DIAGNOSIS — L988 Other specified disorders of the skin and subcutaneous tissue: Secondary | ICD-10-CM

## 2021-04-13 DIAGNOSIS — E1122 Type 2 diabetes mellitus with diabetic chronic kidney disease: Secondary | ICD-10-CM

## 2021-04-13 LAB — HEMOGLOBIN A1C: Hgb A1c MFr Bld: 7.4 % — ABNORMAL HIGH (ref 4.6–6.5)

## 2021-04-13 LAB — GLUCOSE, RANDOM: Glucose, Bld: 145 mg/dL — ABNORMAL HIGH (ref 70–99)

## 2021-04-13 NOTE — Progress Notes (Signed)
[facility] [address] [place] [address] Dept: [phone] Dept Fax: [phone]  Chronic Care Office Visit  Subjective:    Patient ID: George Barnett, male    DOB: [birth year], 87 y.o..   MRN: [id number]  Chief Complaint  Patient presents with  . Follow-up    3 month f/u HTN/Diabetes.  Average BP at home 134/73 -115/62 pulse 52.  Average BS  120-150.    History of Present Illness:  Patient is in today for reassessment of chronic medical issues.  George Barnett continues to see dermatology related to his erosive pustular dermatosis. Apparently, he failed at improvement with Cellcept. He is now using tacrolimus and painting the area with gentian violet. This continues to be quite painful for him.  George Barnett was seen by Dr. Rexene Alberts (neurology) in regards to his tremor. This was not found to be a sign of parkinsonism. He was started on gabapentin. George Barnett is not sure there is any difference int he tremor, but he does feel he is sleeping better.  George Barnett has a history of Type 2 DM, hypertension, and hyperlipidemia. He is monitoring his blood sugars at home. Recently, his values have been 125-150 fasting. He takes metformin and glimiperide for this. He is on losartan for blood pressure control and atorvastatin for his lipids.  Past Medical History: Patient Active Problem List   Diagnosis Date Noted  . Squamous cell carcinoma of antihelix, right 11/02/2020  . Prostate enlargement   . Heart attack ([place])   . Elbow effusion, left   . Cataract   . White coat syndrome with diagnosis of hypertension 03/10/2020  . Stress and adjustment reaction 01/12/2020  . Chronic head/ "scalp" pain 01/12/2020  . GAD (generalized anxiety disorder) 12/10/2019  . 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 Erosive pustular dermatosis of scalp 12/08/2019  . Benign essential tremor 12/08/2019   . Vitamin D deficiency 12/08/2019  . Decreased activity 12/08/2019  . Recurrent right inguinal hernia 05/18/2019  . Atypical chest pain 05/07/2019  . Venous insufficiency of both lower extremities 11/27/2018  . Orthostatic hypotension 11/05/2018  . Bilateral lower extremity edema 11/17/2017  . Hypertension associated with diabetes ([place]) 08/05/2017  . Mixed diabetic hyperlipidemia associated with type 2 diabetes mellitus ([place]) 08/05/2017  . Microalbuminuria due to type 2 diabetes mellitus ([place]) 08/05/2017  . RLS (restless legs syndrome) 06/17/2017  . GERD (gastroesophageal reflux disease) 05/05/2017  . Glossodynia 05/05/2017  . Costochondritis 05/05/2017  . Type 2 diabetes mellitus with stage 3 chronic kidney disease, without long-term current use of insulin ([place]) 05/05/2017  . Stage 3 chronic kidney disease ([place]) 04/22/2017  . CAD (coronary artery disease) 03/25/2017  . Snoring 03/25/2017  . Urinary retention due to benign prostatic hyperplasia 01/25/2015  . Primary osteoarthritis of right shoulder 01/24/2015   Past Surgical History:  Procedure Laterality Date  . CARDIAC CATHETERIZATION     1999  . cardio stint    . HERNIA REPAIR     BILAT   . INGUINAL HERNIA REPAIR  05/18/2019  . INGUINAL HERNIA REPAIR Right 05/18/2019   Procedure: REPAIR RIGHT INGUINAL HERNIA WITH MESH;  Surgeon: Erroll Luna, MD;  Location: [facility];  Service: General;  Laterality: Right;  . MOHS SURGERY     BILAT EARS   . PROSTATE BIOPSY    . TOTAL SHOULDER ARTHROPLASTY Right 01/24/2015   Procedure: RIGHT TOTAL SHOULDER ARTHROPLASTY;  Surgeon: Marchia Bond, MD;  [facility]  OR;  Service: Orthopedics;  Laterality: Right;   Family History  Problem Relation Age of Onset  . Hypertension Brother    Outpatient Medications Prior to Visit  Medication Sig Dispense Refill  . aspirin 81 MG tablet Take 81 mg by mouth every evening.     Marland Kitchen atorvastatin (LIPITOR) 10 MG tablet TAKE 1 TABLET EVERY NIGHT  BEFORE BED.  90 tablet 1  . Cobalamine Combinations (J-15 + FOLIC ACID PO) Take 1 capsule by mouth every evening.     . gabapentin (NEURONTIN) 100 MG capsule Take 1 capsule (100 mg total) by mouth at bedtime. 30 capsule 3  . gentian violet 2 % topical solution Apply topically to open sores daily    . glimepiride (AMARYL) 1 MG tablet Take 1 tablet (1 mg total) by mouth daily with breakfast. 90 tablet 1  . losartan (COZAAR) 25 MG tablet Take 1 tablet (25 mg total) by mouth daily. 90 tablet 1  . metFORMIN (GLUCOPHAGE) 500 MG tablet Take 1 tablet (500 mg total) by mouth 2 (two) times daily with a meal. 180 tablet 1  . Polyethyl Glycol-Propyl Glycol (SYSTANE OP) Place 1 drop into both eyes daily.    . polyethylene glycol (MIRALAX / GLYCOLAX) packet Take 17 g by mouth daily as needed for mild constipation.     . Polyethylene Glycol 400 (BLINK TEARS OP) Apply to eye.    . Probiotic Product (PROBIOTIC DAILY PO) Take 1 capsule by mouth daily with lunch.     . tacrolimus (PROTOPIC) 0.1 % ointment Apply 1 application topically 2 (two) times daily.    . Fluocinolone Acetonide Body 0.01 % OIL Apply 1 Pump topically as needed. (Patient not taking: Reported on 04/13/2021)    . mycophenolate (CELLCEPT) 500 MG tablet Take 1,000 mg by mouth 2 (two) times daily.    . silver sulfADIAZINE (SILVADENE) 1 % cream Apply topically 2 (two) times daily.     No facility-administered medications prior to visit.   Allergies  Allergen Reactions  . Exenatide Nausea And Vomiting  . Meperidine And Related Other (See Comments)    Red streak, that happened once while having an IV  . Nadolol Other (See Comments)    Numbness in fingers  . Sitagliptin Phosphate [Sitagliptin] Other (See Comments)    Weight loss      Objective:   Today's Vitals   04/13/21 1307  BP: 140/78  Pulse: (!) 54  Temp: 97.8 F (36.6 C)  TempSrc: Temporal  SpO2: 96%  Weight: 159 lb 3.2 oz (72.2 kg)  Height: 6\' 1"  (1.854 m)   Body mass index is 21 kg/m.    General: Well developed, well nourished. No acute distress. Psych: Alert and oriented x3. Normal mood and affect.  Health Maintenance Due  Topic Date Due  . Zoster Vaccines- Shingrix (1 of 2) Never done  . TETANUS/TDAP  04/25/2020  . COVID-19 Vaccine (4 - Booster for Pfizer series) 03/24/2021     Assessment & Plan:   1. Hypertension associated with diabetes [facility]) Mr. Sandefur's blood pressure is mildly high today. He has noted home Bps of 115-134/62-73. I will continue his amlodipine for now and reassess this in three months.  2. Type 2 diabetes mellitus with stage 3a chronic kidney disease, without long-term current use of insulin ([place]) Mr. Dupriest's HbA1c was improving at 7.2 on his last visit. We will reassess today. He will continue metformin and glimepiride for now.   - Glucose, random - Hemoglobin A1c  3.  Erosive pustular dermatosis of scalp Reviewed dermatology notes. Mr. Dollard may be showing mild improvement at this point. He will continue his current topical regimen.  4. Benign essential tremor Stable. Continue gabapentin for sleep.  Haydee Salter, MD

## 2021-04-19 DIAGNOSIS — Z872 Personal history of diseases of the skin and subcutaneous tissue: Secondary | ICD-10-CM | POA: Diagnosis not present

## 2021-04-19 DIAGNOSIS — L570 Actinic keratosis: Secondary | ICD-10-CM | POA: Diagnosis not present

## 2021-04-19 DIAGNOSIS — Z85828 Personal history of other malignant neoplasm of skin: Secondary | ICD-10-CM | POA: Diagnosis not present

## 2021-04-19 DIAGNOSIS — L089 Local infection of the skin and subcutaneous tissue, unspecified: Secondary | ICD-10-CM | POA: Diagnosis not present

## 2021-04-19 DIAGNOSIS — L988 Other specified disorders of the skin and subcutaneous tissue: Secondary | ICD-10-CM | POA: Diagnosis not present

## 2021-04-23 ENCOUNTER — Telehealth: Payer: Self-pay

## 2021-04-23 ENCOUNTER — Telehealth: Payer: Self-pay | Admitting: Family Medicine

## 2021-04-23 DIAGNOSIS — R251 Tremor, unspecified: Secondary | ICD-10-CM | POA: Insufficient documentation

## 2021-04-23 DIAGNOSIS — E119 Type 2 diabetes mellitus without complications: Secondary | ICD-10-CM

## 2021-04-23 DIAGNOSIS — I519 Heart disease, unspecified: Secondary | ICD-10-CM | POA: Insufficient documentation

## 2021-04-23 DIAGNOSIS — R350 Frequency of micturition: Secondary | ICD-10-CM | POA: Insufficient documentation

## 2021-04-23 DIAGNOSIS — C801 Malignant (primary) neoplasm, unspecified: Secondary | ICD-10-CM | POA: Insufficient documentation

## 2021-04-23 DIAGNOSIS — I10 Essential (primary) hypertension: Secondary | ICD-10-CM | POA: Insufficient documentation

## 2021-04-23 NOTE — Telephone Encounter (Signed)
Pt calling in regards to lab results.  Pt said that he would like to go ahead and try medication that provider suggested for A1C to get lower.  Please advise.

## 2021-04-23 NOTE — Telephone Encounter (Signed)
Patient notified VIA phone that Dr Gena Fray isn't in the office and that I will forward this message.  Please advise on message below about wanting to start a medication to lower A1C.  Thanks. Dm/cma

## 2021-04-23 NOTE — Telephone Encounter (Signed)
Left message with patient's wife for patient to call back and schedule Medicare Annual Wellness Visit (AWV).   Please offer to do virtually or by telephone.   Last AWV:11/25/2019   Please schedule at anytime with Nurse Health Advisor.

## 2021-04-26 ENCOUNTER — Other Ambulatory Visit: Payer: Self-pay

## 2021-04-26 ENCOUNTER — Ambulatory Visit (INDEPENDENT_AMBULATORY_CARE_PROVIDER_SITE_OTHER): Payer: Medicare Other | Admitting: Cardiology

## 2021-04-26 ENCOUNTER — Encounter: Payer: Self-pay | Admitting: Family Medicine

## 2021-04-26 ENCOUNTER — Encounter: Payer: Self-pay | Admitting: Cardiology

## 2021-04-26 VITALS — BP 122/68 | HR 68 | Ht 72.0 in | Wt 159.0 lb

## 2021-04-26 DIAGNOSIS — E119 Type 2 diabetes mellitus without complications: Secondary | ICD-10-CM

## 2021-04-26 DIAGNOSIS — E782 Mixed hyperlipidemia: Secondary | ICD-10-CM | POA: Diagnosis not present

## 2021-04-26 DIAGNOSIS — L988 Other specified disorders of the skin and subcutaneous tissue: Secondary | ICD-10-CM

## 2021-04-26 DIAGNOSIS — I2510 Atherosclerotic heart disease of native coronary artery without angina pectoris: Secondary | ICD-10-CM | POA: Diagnosis not present

## 2021-04-26 DIAGNOSIS — E1169 Type 2 diabetes mellitus with other specified complication: Secondary | ICD-10-CM | POA: Diagnosis not present

## 2021-04-26 DIAGNOSIS — I951 Orthostatic hypotension: Secondary | ICD-10-CM

## 2021-04-26 DIAGNOSIS — E1122 Type 2 diabetes mellitus with diabetic chronic kidney disease: Secondary | ICD-10-CM

## 2021-04-26 DIAGNOSIS — N1831 Chronic kidney disease, stage 3a: Secondary | ICD-10-CM

## 2021-04-26 MED ORDER — FUROSEMIDE 20 MG PO TABS: 20.0000 mg | ORAL_TABLET | Freq: Every day | ORAL | 1 refills | Status: DC

## 2021-04-26 MED ORDER — EMPAGLIFLOZIN 10 MG PO TABS
10.0000 mg | ORAL_TABLET | Freq: Every day | ORAL | 2 refills | Status: DC
Start: 2021-04-26 — End: 2021-07-17

## 2021-04-26 NOTE — Telephone Encounter (Signed)
Patient notified VIA phone.  No questions.  Dm/cma ? ?

## 2021-04-26 NOTE — Progress Notes (Signed)
Cardiology Office Note:    Date:  04/26/2021   ID:  George Barnett, DOB [birth year], MRN [id number]  PCP:  Haydee Salter, MD  Cardiologist:  Jenne Campus, MD    Referring MD: Lorrene Reid, PA-C   Chief Complaint  Patient presents with   Follow-up  Still having a lot of pain to my scalp but otherwise I am doing well  History of Present Illness:    George Barnett is a 87 y.o. male with past medical history significant for coronary artery disease apparently in 1999 he suffered from small myocardial infarction that required stenting.  I do not have details regarding that event.  Last stress test done in 2020 showed no evidence of ischemia, left ventricle ejection fraction at that time was normal.  His EKG is normal.  He also got diabetes mellitus, dyslipidemia, essential hypertension.  Initially he was sent to [place] because of orthostatic hypotension but then seems to be controlled.  He does have some chronic disease of his scalp and usually conversation with him revolve around this problem which bothers him a lot.  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 George Barnett is a 87 y.o. male who is  here for a follow up visit.  DM: He is taking metformin and glimepiride and tolerates these well. His A1C was 7.1 on recent labs.  HTN: He   Decreased activity 12/08/2019   Dehydration- mild 12/08/2019   Diabetes mellitus without complication (HCC)    Edema, peripheral 11/22/2014   Elbow effusion, left    Erosive pustular dermatosis of scalp 7/51/0258   Folliculitis- scalp; txed by Derm- Dr Jarome Matin 07/27/2018   Frequency of urination    GAD (generalized anxiety disorder) 12/10/2019   GERD (gastroesophageal reflux disease)    Glossodynia 05/05/2017   [name], MD - 11/22/2016 2:15 PM EST Formatting of this note may be different from the original. Otolaryngology Initial Consultation Note  Requesting Attending Physician: [name], MD Service Requesting Consult: No service for patient encounter.  History of Present Illness:   The patient is being seen in consultation with [name], MD for the evaluation of tongu   Heart attack [facility])    Heart disease    High risk medication use 01/15/2019   Hypertension    Hypertension associated with diabetes ([place]) 08/05/2017   Microalbuminuria due to type 2 diabetes mellitus ([place]) 08/05/2017   Mixed diabetic hyperlipidemia associated with type 2 diabetes mellitus ([place]) 08/05/2017   Orthostatic hypotension 11/05/2018   Primary osteoarthritis of right shoulder 01/24/2015   Prostate enlargement    Recurrent right inguinal hernia 05/18/2019   RLS (restless legs syndrome) 06/17/2017  Snoring 03/25/2017   Stage 3 chronic kidney disease ([place]) 04/22/2017   Stage 4 chronic kidney disease ([place]) 09/08/2020   Stress and adjustment reaction 01/12/2020   Tremor of both hands-  both wiht rest and activity; acutely worse 11/27/2018   Tremors of nervous system    Type 2 diabetes mellitus with stage 3 chronic kidney disease, without long-term current use of insulin ([place]) 05/05/2017   Urinary retention due to benign prostatic hyperplasia 01/25/2015   Venous  insufficiency of both lower extremities 11/27/2018   White coat syndrome with diagnosis of hypertension 03/10/2020    Past Surgical History:  Procedure Laterality Date   CARDIAC CATHETERIZATION     1999   cardio stint     HERNIA REPAIR     BILAT    INGUINAL HERNIA REPAIR  05/18/2019   INGUINAL HERNIA REPAIR Right 05/18/2019   Procedure: REPAIR RIGHT INGUINAL HERNIA WITH MESH;  Surgeon: Erroll Luna, MD;  Location: [place];  Service: General;  Laterality: Right;   MOHS SURGERY     BILAT EARS    PROSTATE BIOPSY     TOTAL SHOULDER ARTHROPLASTY Right 01/24/2015   Procedure: RIGHT TOTAL SHOULDER ARTHROPLASTY;  Surgeon: Marchia Bond, MD;  Location: [place];  Service: Orthopedics;  Laterality: Right;    Current Medications: Current Meds  Medication Sig   aspirin 81 MG tablet Take 81 mg by mouth every evening.    atorvastatin (LIPITOR) 10 MG tablet TAKE 1 TABLET EVERY NIGHT  BEFORE BED. (Patient taking differently: Take 10 mg by mouth daily. TAKE 1 TABLET EVERY NIGHT  BEFORE BED.)   Cobalamine Combinations (I-33 + FOLIC ACID PO) Take 1 capsule by mouth every evening. Unknown strength   empagliflozin (JARDIANCE) 10 MG TABS tablet Take 1 tablet (10 mg total) by mouth daily before breakfast.   gabapentin (NEURONTIN) 100 MG capsule Take 1 capsule (100 mg total) by mouth at bedtime. (Patient taking differently: Take 100 mg by mouth 3 (three) times daily.)   gentian violet 2 % topical solution Use as directed 0.5 mLs in the mouth or throat 3 (three) times daily.   glimepiride (AMARYL) 1 MG tablet Take 1 tablet (1 mg total) by mouth daily with breakfast.   losartan (COZAAR) 25 MG tablet Take 1 tablet (25 mg total) by mouth daily.   metFORMIN (GLUCOPHAGE) 500 MG tablet Take 1 tablet (500 mg total) by mouth 2 (two) times daily with a meal.   Polyethyl Glycol-Propyl Glycol (SYSTANE OP) Place 1 drop into both eyes daily. Unknown stregth   polyethylene glycol (MIRALAX / GLYCOLAX) packet Take 17 g by mouth  daily as needed for mild constipation.    Polyethylene Glycol 400 (BLINK TEARS OP) Apply 1 drop to eye as needed (eye dryness).   Probiotic Product (PROBIOTIC DAILY PO) Take 1 capsule by mouth daily with lunch. Unknown strength   tacrolimus (PROTOPIC) 0.1 % ointment Apply 1 application topically 2 (two) times daily.   [DISCONTINUED] furosemide (LASIX) 20 MG tablet Take 20 mg by mouth as needed for edema or fluid.     Allergies:   Exenatide, Meperidine and related, Nadolol, and Sitagliptin phosphate [sitagliptin]   Social History   Socioeconomic History   Marital status: Married    Spouse name: Not on file   Number of children: Not on file   Years of education: Not on file   Highest education level: Not on file  Occupational History   Not on file  Tobacco Use   Smoking status: Never  Smokeless tobacco: Never  Vaping Use   Vaping Use: Never used  Substance and Sexual Activity   Alcohol use: No   Drug use: No   Sexual activity: Not on file  Other Topics Concern   Not on file  Social History Narrative   Not on file   Social Determinants of Health   Financial Resource Strain: Not on file  Food Insecurity: Not on file  Transportation Needs: Not on file  Physical Activity: Not on file  Stress: Not on file  Social Connections: Not on file     Family History: The patient's family history includes Hypertension in his brother. ROS:   Please see the history of present illness.    All 14 point review of systems negative except as described per history of present illness  EKGs/Labs/Other Studies Reviewed:      Recent Labs: 09/06/2020: ALT 15 01/08/2021: BUN 33; Creatinine, Ser 1.26; Potassium 5.1; Sodium 140  Recent Lipid Panel    Component Value Date/Time   CHOL 94 01/08/2021 1534   CHOL 130 12/03/2019 0837   TRIG 57.0 01/08/2021 1534   HDL 52.20 01/08/2021 1534   HDL 69 12/03/2019 0837   CHOLHDL 2 01/08/2021 1534   VLDL 11.4 01/08/2021 1534   LDLCALC 30  01/08/2021 1534   LDLCALC 49 12/03/2019 0837    Physical Exam:    VS:  BP 122/68 (BP Location: Left Arm, Patient Position: Sitting)   Pulse 68   Ht 6' (1.829 m)   Wt 159 lb (72.1 kg)   SpO2 94%   BMI 21.56 kg/m     Wt Readings from Last 3 Encounters:  04/26/21 159 lb (72.1 kg)  04/13/21 159 lb 3.2 oz (72.2 kg)  01/30/21 155 lb 8 oz (70.5 kg)     GEN:  Well nourished, well developed in no acute distress HEENT: Normal NECK: No JVD; No carotid bruits LYMPHATICS: No lymphadenopathy CARDIAC: RRR, no murmurs, no rubs, no gallops RESPIRATORY:  Clear to auscultation without rales, wheezing or rhonchi  ABDOMEN: Soft, non-tender, non-distended MUSCULOSKELETAL:  No edema; No deformity  SKIN: Warm and dry LOWER EXTREMITIES: no swelling NEUROLOGIC:  Alert and oriented x 3 PSYCHIATRIC:  Normal affect   ASSESSMENT:    1. Orthostatic hypotension   2. Type 2 diabetes mellitus with stage 3a chronic kidney disease, without long-term current use of insulin (HCC)   3. Erosive pustular dermatosis of scalp   4. Diabetes mellitus without complication ([place])   5. Mixed diabetic hyperlipidemia associated with type 2 diabetes mellitus ([place])   6. Coronary artery disease involving native coronary artery of native heart without angina pectoris    PLAN:    In order of problems listed above:  Orthostatic hypotension not an issue anymore Coronary artery disease with history of myocardial infarction 1999.  Asymptomatic from that point review. Swelling of lower extremities he takes Lasix 20 mg daily to help with that.  I will schedule him to have echocardiogram to reassess left ventricle ejection fraction. Mixed dyslipidemia he is LDL is only 38 I asked him to reduce dose of Lipitor every other day.  He takes only 10 mg, a little check his fasting blood profile, he is K PN show me his LDL of 30 and HDL of 52 Chronic scalp condition which really is troubling him a lot a lot of different kind of  therapeutical approaches has been tried none of this seems to be helping.   Medication Adjustments/Labs and Tests Ordered: Current medicines are  reviewed at length with the patient today.  Concerns regarding medicines are outlined above.  Orders Placed This Encounter  Procedures   EKG 12-Lead   ECHOCARDIOGRAM COMPLETE   Medication changes:  Meds ordered this encounter  Medications   furosemide (LASIX) 20 MG tablet    Sig: Take 1 tablet (20 mg total) by mouth daily.    Dispense:  90 tablet    Refill:  1    Signed, Park Liter, MD, [facility] 04/26/2021 1:33 PM    [facility]

## 2021-04-26 NOTE — Addendum Note (Signed)
Addended by: Senaida Ores on: 04/26/2021 01:42 PM   Modules accepted: Orders

## 2021-04-26 NOTE — Patient Instructions (Signed)
Medication Instructions:  Your physician has recommended you make the following change in your medication:  DECREASE: Lipitor to 10 mg every other day   START: Lasix 20 mg daily   *If you need a refill on your cardiac medications before your next appointment, please call your pharmacy*   Lab Work: None If you have labs (blood work) drawn today and your tests are completely normal, you will receive your results only by: [place] (if you have MyChart) OR A paper copy in the mail If you have any lab test that is abnormal or we need to change your treatment, we will call you to review the results.   Testing/Procedures: Your physician has requested that you have an echocardiogram. Echocardiography is a painless test that uses sound waves to create images of your heart. It provides your doctor with information about the size and shape of your heart and how well your heart's chambers and valves are working. This procedure takes approximately one hour. There are no restrictions for this procedure.    Follow-Up: At [facility], you and your health needs are our priority.  As part of our continuing mission to provide you with exceptional heart care, we have created designated Provider Care Teams.  These Care Teams include your primary Cardiologist (physician) and Advanced Practice Providers (APPs -  Physician Assistants and Nurse Practitioners) who all work together to provide you with the care you need, when you need it.  We recommend signing up for the patient portal called "MyChart".  Sign up information is provided on this After Visit Summary.  MyChart is used to connect with patients for Virtual Visits (Telemedicine).  Patients are able to view lab/test results, encounter notes, upcoming appointments, etc.  Non-urgent messages can be sent to your provider as well.   To learn more about what you can do with MyChart, go to [email or web address].    Your next appointment:   6  month(s)  The format for your next appointment:   In Person  Provider:   Jenne Campus, MD   Other Instructions  Echocardiogram An echocardiogram is a test that uses sound waves (ultrasound) to produce images of the heart. Images from an echocardiogram can provide important information about: Heart size and shape. The size and thickness and movement of your heart's walls. Heart muscle function and strength. Heart valve function or if you have stenosis. Stenosis is when the heart valves are too narrow. If blood is flowing backward through the heart valves (regurgitation). A tumor or infectious growth around the heart valves. Areas of heart muscle that are not working well because of poor blood flow or injury from a heart attack. Aneurysm detection. An aneurysm is a weak or damaged part of an artery wall. The wall bulges out from the normal force of blood pumping through the body. Tell a health care provider about: Any allergies you have. All medicines you are taking, including vitamins, herbs, eye drops, creams, and over-the-counter medicines. Any blood disorders you have. Any surgeries you have had. Any medical conditions you have. Whether you are pregnant or may be pregnant. What are the risks? Generally, this is a safe test. However, problems may occur, including an allergic reaction to dye (contrast) that may be used during the test. What happens before the test? No specific preparation is needed. You may eat and drink normally. What happens during the test? You will take off your clothes from the waist up and put on a hospital gown. Electrodes or  electrocardiogram (ECG)patches may be placed on your chest. The electrodes or patches are then connected to a device that monitors your heart rate and rhythm. You will lie down on a table for an ultrasound exam. A gel will be applied to your chest to help sound waves pass through your skin. A handheld device, called a transducer,  will be pressed against your chest and moved over your heart. The transducer produces sound waves that travel to your heart and bounce back (or "echo" back) to the transducer. These sound waves will be captured in real-time and changed into images of your heart that can be viewed on a video monitor. The images will be recorded on a computer and reviewed by your health care provider. You may be asked to change positions or hold your breath for a short time. This makes it easier to get different views or better views of your heart. In some cases, you may receive contrast through an IV in one of your veins. This can improve the quality of the pictures from your heart. The procedure may vary among health care providers and hospitals.   What can I expect after the test? You may return to your normal, everyday life, including diet, activities, and medicines, unless your health care provider tells you not to do that. Follow these instructions at home: It is up to you to get the results of your test. Ask your health care provider, or the department that is doing the test, when your results will be ready. Keep all follow-up visits. This is important. Summary An echocardiogram is a test that uses sound waves (ultrasound) to produce images of the heart. Images from an echocardiogram can provide important information about the size and shape of your heart, heart muscle function, heart valve function, and other possible heart problems. You do not need to do anything to prepare before this test. You may eat and drink normally. After the echocardiogram is completed, you may return to your normal, everyday life, unless your health care provider tells you not to do that. This information is not intended to replace advice given to you by your health care provider. Make sure you discuss any questions you have with your health care provider. Document Revised: 06/27/2020 Document Reviewed: 06/27/2020 Elsevier Patient  Education  2021 [employer].

## 2021-04-30 NOTE — Progress Notes (Signed)
Subjective:   George [facility] is a 87 y.o. male who presents for Medicare Annual/Subsequent preventive examination.  I connected with  George [facility] on 05/01/21 by a telephone enabled telemedicine application and verified that I am speaking with the correct person using two identifiers.   I discussed the limitations of evaluation and management by telemedicine. The patient expressed understanding and agreed to proceed.  Patient location: [facility]  Provider location: Telehealth  I provided 30 minutes of non face - to - face time during this encounter.   Review of Systems     NA Cardiac Risk Factors include: advanced age (>28men, >53 women);diabetes mellitus;dyslipidemia;hypertension;male gender     Objective:    Today's Vitals   05/01/21 0812  PainSc: 7    There is no height or weight on file to calculate BMI.  Advanced Directives 05/01/2021 05/18/2019 05/14/2019 04/29/2019 08/22/2017 07/04/2017 06/13/2017  Does Patient Have a Medical Advance Directive? Yes Yes Yes No;Yes Yes No;Yes No  Type of Paramedic of [place];Living will [place];Living will [place];Living will - [place];Living will Living will -  Does patient want to make changes to medical advance directive? - No - Patient declined No - Patient declined No - Patient declined - - -  Copy of [place] in Chart? No - copy requested No - copy requested - - No - copy requested - -  Would patient like information on creating a medical advance directive? - - - - - - No - Patient declined    Current Medications (verified) Outpatient Encounter Medications as of 05/01/2021  Medication Sig   aspirin 81 MG tablet Take 81 mg by mouth every evening.    atorvastatin (LIPITOR) 10 MG tablet TAKE 1 TABLET EVERY NIGHT  BEFORE BED. (Patient taking differently: Take 10 mg by mouth daily. TAKE 1 TABLET EVERY NIGHT  BEFORE BED.)   Cobalamine  Combinations (G-25 + FOLIC ACID PO) Take 1 capsule by mouth every evening. Unknown strength   empagliflozin (JARDIANCE) 10 MG TABS tablet Take 1 tablet (10 mg total) by mouth daily before breakfast.   furosemide (LASIX) 20 MG tablet Take 1 tablet (20 mg total) by mouth daily.   gabapentin (NEURONTIN) 100 MG capsule Take 1 capsule (100 mg total) by mouth at bedtime. (Patient taking differently: Take 100 mg by mouth 3 (three) times daily.)   gentian violet 2 % topical solution Use as directed 0.5 mLs in the mouth or throat 3 (three) times daily.   glimepiride (AMARYL) 1 MG tablet Take 1 tablet (1 mg total) by mouth daily with breakfast.   losartan (COZAAR) 25 MG tablet Take 1 tablet (25 mg total) by mouth daily.   metFORMIN (GLUCOPHAGE) 500 MG tablet Take 1 tablet (500 mg total) by mouth 2 (two) times daily with a meal.   Polyethyl Glycol-Propyl Glycol (SYSTANE OP) Place 1 drop into both eyes daily. Unknown stregth   polyethylene glycol (MIRALAX / GLYCOLAX) packet Take 17 g by mouth daily as needed for mild constipation.    Polyethylene Glycol 400 (BLINK TEARS OP) Apply 1 drop to eye as needed (eye dryness).   Probiotic Product (PROBIOTIC DAILY PO) Take 1 capsule by mouth daily with lunch. Unknown strength   tacrolimus (PROTOPIC) 0.1 % ointment Apply 1 application topically 2 (two) times daily.   No facility-administered encounter medications on file as of 05/01/2021.    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 George [facility] is a 87 y.o. male who is here for a follow up visit.  DM: He is taking metformin and glimepiride and tolerates these well. His A1C was 7.1 on recent labs.  HTN: He   Decreased activity 12/08/2019   Dehydration- mild 12/08/2019   Diabetes mellitus without complication (HCC)    Edema, peripheral 11/22/2014   Elbow effusion, left    Erosive pustular dermatosis of scalp 2/77/4128   Folliculitis- scalp; txed by Derm- Dr Jarome Matin 07/27/2018   Frequency of urination    GAD (generalized anxiety disorder) 12/10/2019   GERD (gastroesophageal reflux disease)    Glossodynia 05/05/2017   [name], MD - 11/22/2016 2:15 PM EST Formatting of this note may be different from the original. Otolaryngology Initial Consultation Note  Requesting Attending Physician: [name], MD Service Requesting Consult: No service for patient encounter.  History of Present Illness:   The patient is being seen in consultation with [name], MD for the evaluation of tongu   Heart attack [facility])    Heart disease    High risk medication use 01/15/2019   Hypertension    Hypertension associated with diabetes ([place]) 08/05/2017   Microalbuminuria due to type 2 diabetes mellitus ([place]) 08/05/2017   Mixed diabetic hyperlipidemia associated with type 2 diabetes mellitus ([place]) 08/05/2017   Orthostatic hypotension 11/05/2018   Primary osteoarthritis of right shoulder 01/24/2015   Prostate enlargement    Recurrent right inguinal hernia 05/18/2019   RLS (restless legs syndrome)  06/17/2017   Snoring 03/25/2017   Stage 3 chronic kidney disease ([place]) 04/22/2017   Stage 4 chronic kidney disease ([place]) 09/08/2020   Stress and adjustment reaction 01/12/2020   Tremor of both hands-  both wiht rest and activity; acutely worse 11/27/2018   Tremors of nervous system    Type 2 diabetes mellitus with stage 3 chronic kidney disease, without long-term current use of insulin ([place]) 05/05/2017   Urinary retention due to benign prostatic hyperplasia 01/25/2015   Venous insufficiency of both lower extremities 11/27/2018   White coat syndrome with diagnosis of hypertension 03/10/2020   Past Surgical History:  Procedure Laterality Date   CARDIAC CATHETERIZATION     1999   cardio stint     HERNIA REPAIR     BILAT    INGUINAL HERNIA REPAIR  05/18/2019   INGUINAL HERNIA REPAIR Right 05/18/2019   Procedure: REPAIR RIGHT INGUINAL HERNIA WITH MESH;  Surgeon: Erroll Luna, MD;  Location: [place];  Service: General;  Laterality: Right;   [place]  PROSTATE BIOPSY     TOTAL SHOULDER ARTHROPLASTY Right 01/24/2015   Procedure: RIGHT TOTAL SHOULDER ARTHROPLASTY;  Surgeon: Marchia Bond, MD;  Location: [place];  Service: Orthopedics;  Laterality: Right;   Family History  Problem Relation Age of Onset   Hypertension Brother    Social History   Socioeconomic History   Marital status: Married    Spouse name: Not on file   Number of children: Not on file   Years of education: Not on file   Highest education level: Not on file  Occupational History   Not on file  Tobacco Use   Smoking status: Never   Smokeless tobacco: Never  Vaping Use   Vaping Use: Never used  Substance and Sexual Activity   Alcohol use: No   Drug use: No   Sexual activity: Not on file  Other Topics Concern   Not on file  Social History Narrative   Not on file   Social Determinants of Health   Financial Resource Strain: Low Risk    Difficulty of Paying Living Expenses: Not hard at all  Food  Insecurity: No Food Insecurity   Worried About Charity fundraiser in the Last Year: Never true   [place] in the Last Year: Never true  Transportation Needs: No Transportation Needs   Lack of Transportation (Medical): No   Lack of Transportation (Non-Medical): No  Physical Activity: Inactive   Days of Exercise per Week: 0 days   Minutes of Exercise per Session: 0 min  Stress: No Stress Concern Present   Feeling of Stress : Only a little  Social Connections: Moderately Integrated   Frequency of Communication with Friends and Family: More than three times a week   Frequency of Social Gatherings with Friends and Family: Three times a week   Attends Religious Services: More than 4 times per year   Active Member of Clubs or Organizations: No   Attends Archivist Meetings: Never   Marital Status: Married    Tobacco Counseling Counseling given: Not Answered   Clinical Intake:  Pre-visit preparation completed: Yes  Pain : 0-10 Pain Score: 7  Pain Type: Chronic pain Pain Location: Other (Comment) (scalp) Pain Descriptors / Indicators: Discomfort, Tender Pain Onset: More than a month ago Pain Frequency: Intermittent Pain Relieving Factors: gabapentin  Pain Relieving Factors: gabapentin  Nutritional Risks: None Diabetes: Yes CBG done?: No Did pt. bring in CBG monitor from [facility]?: No  How often do you need to have someone help you when you read instructions, pamphlets, or other written materials from your doctor or pharmacy?: 1 - Never  Diabetic? Yes  Nutrition Risk Assessment:  Has the patient had any N/V/D within the last 2 months?  No  Does the patient have any non-healing wounds?  No  Has the patient had any unintentional weight loss or weight gain?  No   Diabetes:  Is the patient diabetic?  Yes  If diabetic, was a CBG obtained today?  No  Did the patient bring in their glucometer from [facility]?  No  How often do you monitor your CBG's? Once a day.    Financial Strains and Diabetes Management:  Are you having any financial strains with the device, your supplies or your medication? No .  Does the patient want to be seen by Chronic Care Management for management of their diabetes?  No  Would the patient like to be referred to a Nutritionist or for Diabetic Management?  No  Diabetic Exams:  Diabetic Eye Exam: Completed 02-2021. Overdue for diabetic eye exam. Pt has been advised about the importance in completing this exam. A referral has been placed today. Message sent to referral coordinator for scheduling purposes. Advised pt to expect a call from office referred to regarding appt.  Diabetic Foot Exam: Completed . Pt has been advised about the importance in completing this exam. Pt is scheduled for diabetic foot exam on 11-27-2018    Interpreter Needed?: No  Information entered by :: Leroy Kennedy LPN   Activities of Daily Living In your present state of health, do you have any difficulty performing the following activities: 05/01/2021 09/06/2020  Hearing? Y N  Comment hearing aids in both ears -  Vision? N N  Difficulty concentrating or making decisions? N Y  Walking or climbing stairs? N N  Dressing or bathing? N N  Doing errands, shopping? N N  Preparing Food and eating ? N -  Using the Toilet? N -  In the past six months, have you accidently leaked urine? N -  Managing your Medications? N -  Managing your Finances? N -  Housekeeping or managing your Housekeeping? N -  Some recent data might be hidden    Patient Care Team: Haydee Salter, MD as PCP - General (Family Medicine) Jerrell [place], MD as Consulting Physician (Otolaryngology) Franchot Gallo, MD as Consulting Physician (Urology) Jamse Arn, MD as Consulting Physician (Physical Medicine and Rehabilitation) Deneise Lever, MD as Consulting Physician (Pulmonary Disease) Park Liter, MD as Consulting Physician (Cardiology) Garry Heater, MD as Referring Physician (Dermatology) Star Age, MD as Attending Physician (Neurology) Marygrace Drought, MD as Consulting Physician (Ophthalmology)  Indicate any recent Medical Services you may have received from other than [facility] providers in the past year (date may be approximate).     Assessment:   This is a routine wellness examination for [facility].  Hearing/Vision screen Hearing Screening - Comments:: Has hearing aids in both ears Vision Screening - Comments:: [facility]  up to date  Dietary issues and exercise activities discussed: Current Exercise Habits: The patient does not participate in regular exercise at present   Goals Addressed             This Visit's Progress    Increase physical activity         Depression Screen PHQ 2/9 Scores 05/01/2021 01/08/2021 09/06/2020 06/09/2020 03/10/2020 12/08/2019 11/25/2019  PHQ - 2 Score 0 1 0 0 1 0 2  PHQ- 9 Score - - 0 0 1 0 3    Fall Risk Fall Risk  05/01/2021 01/08/2021 09/06/2020 06/09/2020 11/25/2019  Falls in the past year? 0 0 0 0 0  Comment - - - - -  Number falls in past yr: 0 - - - 0  Injury with Fall? 0 - - - 0  Risk for fall due to : - - No Fall Risks No Fall Risks -  Follow up Falls evaluation completed;Falls prevention discussed - Falls evaluation completed Falls evaluation completed Falls evaluation completed    FALL RISK PREVENTION PERTAINING TO THE [facility]:  Any stairs in or around the [facility]? No  If so, are there any without handrails? Yes  [place] free of loose throw rugs in walkways, pet beds, electrical cords, etc? Yes  Adequate lighting in your [facility] to reduce risk of falls? Yes   ASSISTIVE DEVICES UTILIZED TO PREVENT FALLS:  Life alert? Yes  Use of a cane, walker or w/c?  No  Grab bars in the bathroom? Yes  Shower chair or bench in shower? Yes  Elevated toilet seat or a handicapped toilet? No   TIMED UP AND GO:  Was the test performed? No .      Cognitive Function:  Normal  cognitive status assessed by direct observation by this Nurse Health Advisor. No abnormalities found.      6CIT Screen 11/25/2019  What Year? 0 points  What month? 0 points  What time? 0 points  Count back from 20 0 points  Months in reverse 0 points  Repeat phrase 0 points  Total Score 0    Immunizations Immunization History  Administered Date(s) Administered   Influenza Nasal 08/15/2018   Influenza-Unspecified 08/22/2014, 09/17/2015, 08/26/2016, 08/28/2017, 08/19/2019   PFIZER(Purple Top)SARS-COV-2 Vaccination 12/01/2019, 12/21/2019, 12/25/2020   Pneumococcal Conjugate-13 03/25/2017   Pneumococcal Polysaccharide-23 02/16/2013   Tdap 04/25/2010   Zoster, Live 01/03/2014, 08/18/2014    TDAP status: Due, Education has been provided regarding the importance of this vaccine. Advised may receive this vaccine at local pharmacy or Health Dept. Aware to provide a copy of the vaccination record if obtained from local pharmacy or Health Dept. Verbalized acceptance and understanding.  Flu Vaccine status: Up to date  Pneumococcal vaccine status: Up to date  Covid-19 vaccine status: Completed vaccines  Qualifies for Shingles Vaccine? Yes   Zostavax completed No   Shingrix Completed?: No.    Education has been provided regarding the importance of this vaccine. Patient has been advised to call insurance company to determine out of pocket expense if they have not yet received this vaccine. Advised may also receive vaccine at local pharmacy or Health Dept. Verbalized acceptance and understanding.  Screening Tests Health Maintenance  Topic Date Due   Zoster Vaccines- Shingrix (1 of 2) Never done   TETANUS/TDAP  04/25/2020   COVID-19 Vaccine (4 - Booster for Pfizer series) 03/24/2021   FOOT EXAM  06/09/2021   INFLUENZA VACCINE  06/18/2021   HEMOGLOBIN A1C  10/14/2021   OPHTHALMOLOGY EXAM  11/20/2021   PNA vac Low Risk Adult  Completed   HPV VACCINES  Aged Out    Health  Maintenance  Health Maintenance Due  Topic Date Due   Zoster Vaccines- Shingrix (1 of 2) Never done   TETANUS/TDAP  04/25/2020   COVID-19 Vaccine (4 - Booster for Pfizer series) 03/24/2021    Colorectal cancer screening: No longer required.   Lung Cancer Screening: (Low Dose CT Chest recommended if Age 36-80 years, 30 pack-year currently smoking OR have quit w/in 15years.) does not qualify.   Lung Cancer Screening Referral:  na  Additional Screening:  Hepatitis C Screening: does qualify  Vision Screening: Recommended annual ophthalmology exams for early detection of glaucoma and other disorders of the eye. Is the patient up to date with their annual eye exam?  Yes  Who is the provider or what is the name of the office in which the patient attends annual eye exams? [facility] If pt is not established with a provider, would they like to be referred to a provider to establish care?  established .   Dental Screening: Recommended annual dental exams for proper oral hygiene  Community Resource Referral / Chronic Care Management: CRR required this visit?  No   CCM required this visit?  No      Plan:     I have personally reviewed and noted the following in the patient's chart:   Medical and social history Use of  alcohol, tobacco or illicit drugs  Current medications and supplements including opioid prescriptions. Patient is not currently taking opioid prescriptions. Functional ability and status Nutritional status Physical activity Advanced directives List of other physicians Hospitalizations, surgeries, and ER visits in previous 12 months Vitals Screenings to include cognitive, depression, and falls Referrals and appointments  In addition, I have reviewed and discussed with patient certain preventive protocols, quality metrics, and best practice recommendations. A written personalized care plan for preventive services as well as general preventive health  recommendations were provided to patient.     Leroy Kennedy, LPN   [id number]/[id number]   Nurse Notes: na

## 2021-05-01 ENCOUNTER — Ambulatory Visit (INDEPENDENT_AMBULATORY_CARE_PROVIDER_SITE_OTHER): Payer: Medicare Other | Admitting: *Deleted

## 2021-05-01 DIAGNOSIS — Z0000 Encounter for general adult medical examination without abnormal findings: Secondary | ICD-10-CM | POA: Diagnosis not present

## 2021-05-01 NOTE — Patient Instructions (Addendum)
George Barnett , Thank you for taking time to come for your Medicare Wellness Visit. I appreciate your ongoing commitment to your health goals. Please review the following plan we discussed and let me know if I can assist you in the future.   Screening recommendations/referrals: Colonoscopy: no longer required Recommended yearly ophthalmology/optometry visit for glaucoma screening and checkup Recommended yearly dental visit for hygiene and checkup  Vaccinations: Influenza vaccine: Up to date Pneumococcal vaccine: Up to date Tdap vaccine:  Education provided Shingles vaccine: Education provided on Shingrix    Advanced directives: copy requested  Conditions/risks identified:   Next appointment: 8-29 @ 1:00 [place]  Preventive Care 85 Years and Older, Male Preventive care refers to lifestyle choices and visits with your health care provider that can promote health and wellness. What does preventive care include? A yearly physical exam. This is also called an annual well check. Dental exams once or twice a year. Routine eye exams. Ask your health care provider how often you should have your eyes checked. Personal lifestyle choices, including: Daily care of your teeth and gums. Regular physical activity. Eating a healthy diet. Avoiding tobacco and drug use. Limiting alcohol use. Practicing safe sex. Taking low doses of aspirin every day. Taking vitamin and mineral supplements as recommended by your health care provider. What happens during an annual well check? The services and screenings done by your health care provider during your annual well check will depend on your age, overall health, lifestyle risk factors, and family history of disease. Counseling  Your health care provider may ask you questions about your: Alcohol use. Tobacco use. Drug use. Emotional well-being. Home and relationship well-being. Sexual activity. Eating habits. History of falls. Memory and ability to  understand (cognition). Work and work Statistician. Screening  You may have the following tests or measurements: Height, weight, and BMI. Blood pressure. Lipid and cholesterol levels. These may be checked every 5 years, or more frequently if you are over 42 years old. Skin check. Lung cancer screening. You may have this screening every year starting at age 82 if you have a 30-pack-year history of smoking and currently smoke or have quit within the past 15 years. Fecal occult blood test (FOBT) of the stool. You may have this test every year starting at age 71. Flexible sigmoidoscopy or colonoscopy. You may have a sigmoidoscopy every 5 years or a colonoscopy every 10 years starting at age 15. Prostate cancer screening. Recommendations will vary depending on your family history and other risks. Hepatitis C blood test. Hepatitis B blood test. Sexually transmitted disease (STD) testing. Diabetes screening. This is done by checking your blood sugar (glucose) after you have not eaten for a while (fasting). You may have this done every 1-3 years. Abdominal aortic aneurysm (AAA) screening. You may need this if you are a current or former smoker. Osteoporosis. You may be screened starting at age 67 if you are at high risk. Talk with your health care provider about your test results, treatment options, and if necessary, the need for more tests. Vaccines  Your health care provider may recommend certain vaccines, such as: Influenza vaccine. This is recommended every year. Tetanus, diphtheria, and acellular pertussis (Tdap, Td) vaccine. You may need a Td booster every 10 years. Zoster vaccine. You may need this after age 57. Pneumococcal 13-valent conjugate (PCV13) vaccine. One dose is recommended after age 29. Pneumococcal polysaccharide (PPSV23) vaccine. One dose is recommended after age 21. Talk to your health care provider about which screenings  and vaccines you need and how often you need them. This  information is not intended to replace advice given to you by your health care provider. Make sure you discuss any questions you have with your health care provider. Document Released: 12/01/2015 Document Revised: 07/24/2016 Document Reviewed: 09/05/2015 Elsevier Interactive Patient Education  2017 [place] Prevention in the Home Falls can cause injuries. They can happen to people of all ages. There are many things you can do to make your home safe and to help prevent falls. What can I do on the outside of my home? Regularly fix the edges of walkways and driveways and fix any cracks. Remove anything that might make you trip as you walk through a door, such as a raised step or threshold. Trim any bushes or trees on the path to your home. Use bright outdoor lighting. Clear any walking paths of anything that might make someone trip, such as rocks or tools. Regularly check to see if handrails are loose or broken. Make sure that both sides of any steps have handrails. Any raised decks and porches should have guardrails on the edges. Have any leaves, snow, or ice cleared regularly. Use sand or salt on walking paths during winter. Clean up any spills in your garage right away. This includes oil or grease spills. What can I do in the bathroom? Use night lights. Install grab bars by the toilet and in the tub and shower. Do not use towel bars as grab bars. Use non-skid mats or decals in the tub or shower. If you need to sit down in the shower, use a plastic, non-slip stool. Keep the floor dry. Clean up any water that spills on the floor as soon as it happens. Remove soap buildup in the tub or shower regularly. Attach bath mats securely with double-sided non-slip rug tape. Do not have throw rugs and other things on the floor that can make you trip. What can I do in the bedroom? Use night lights. Make sure that you have a light by your bed that is easy to reach. Do not use any sheets or  blankets that are too big for your bed. They should not hang down onto the floor. Have a firm chair that has side arms. You can use this for support while you get dressed. Do not have throw rugs and other things on the floor that can make you trip. What can I do in the kitchen? Clean up any spills right away. Avoid walking on wet floors. Keep items that you use a lot in easy-to-reach places. If you need to reach something above you, use a strong step stool that has a grab bar. Keep electrical cords out of the way. Do not use floor polish or wax that makes floors slippery. If you must use wax, use non-skid floor wax. Do not have throw rugs and other things on the floor that can make you trip. What can I do with my stairs? Do not leave any items on the stairs. Make sure that there are handrails on both sides of the stairs and use them. Fix handrails that are broken or loose. Make sure that handrails are as long as the stairways. Check any carpeting to make sure that it is firmly attached to the stairs. Fix any carpet that is loose or worn. Avoid having throw rugs at the top or bottom of the stairs. If you do have throw rugs, attach them to the floor with carpet tape.  Make sure that you have a light switch at the top of the stairs and the bottom of the stairs. If you do not have them, ask someone to add them for you. What else can I do to help prevent falls? Wear shoes that: Do not have high heels. Have rubber bottoms. Are comfortable and fit you well. Are closed at the toe. Do not wear sandals. If you use a stepladder: Make sure that it is fully opened. Do not climb a closed stepladder. Make sure that both sides of the stepladder are locked into place. Ask someone to hold it for you, if possible. Clearly mark and make sure that you can see: Any grab bars or handrails. First and last steps. Where the edge of each step is. Use tools that help you move around (mobility aids) if they are  needed. These include: Canes. Walkers. Scooters. Crutches. Turn on the lights when you go into a dark area. Replace any light bulbs as soon as they burn out. Set up your furniture so you have a clear path. Avoid moving your furniture around. If any of your floors are uneven, fix them. If there are any pets around you, be aware of where they are. Review your medicines with your doctor. Some medicines can make you feel dizzy. This can increase your chance of falling. Ask your doctor what other things that you can do to help prevent falls. This information is not intended to replace advice given to you by your health care provider. Make sure you discuss any questions you have with your health care provider. Document Released: 08/31/2009 Document Revised: 04/11/2016 Document Reviewed: 12/09/2014 Elsevier Interactive Patient Education  2017 [employer].

## 2021-05-03 ENCOUNTER — Ambulatory Visit (INDEPENDENT_AMBULATORY_CARE_PROVIDER_SITE_OTHER): Payer: Medicare Other | Admitting: Neurology

## 2021-05-03 ENCOUNTER — Encounter: Payer: Self-pay | Admitting: Neurology

## 2021-05-03 VITALS — BP 142/76 | HR 63 | Ht 72.0 in | Wt 157.3 lb

## 2021-05-03 DIAGNOSIS — I2510 Atherosclerotic heart disease of native coronary artery without angina pectoris: Secondary | ICD-10-CM

## 2021-05-03 DIAGNOSIS — G250 Essential tremor: Secondary | ICD-10-CM

## 2021-05-03 NOTE — Progress Notes (Signed)
Subjective:    Patient ID: George Barnett is a 87 y.o. male.  HPI    Interim history:   George Barnett is an 87 year old right-handed gentleman with an underlying medical history of diabetes, hypertension, chronic kidney disease, hyperlipidemia, venous insufficiency, and history of orthostatic hypotension, who presents for Follow-up consultation of his essential tremor of several years duration.  The patient is accompanied by his wife again today.  I last saw him on 01/30/2021, at which time he reported that his tremor has become worse.  He did have a resting component, he was worried about Parkinson's disease but he did not have any telltale signs of parkinsonism.  We talked about treatment options for essential tremor.  I suggested we avoid a beta-blocker.  He was advised that we could consider low-dose Mysoline but I advised him to use gabapentin and low-dose for now, 100 mg at bedtime.  We talked about side effects of beta-blockers, especially in light of his orthostatic lightheadedness, and the risk for hypotension as well as his chronic kidney disease and the risk for side effects with Mysoline at length at the time.  Today, 05/03/21: He reports feeling about the same with the regard to the tremor, perhaps slightly better.  He has significant pain on his scalp, he has to go to the dermatologist on 6 weekly basis get treatments for his pustular dermatitis.  Treatment at home and cleaning the debris at home on a day-to-day basis is extremely painful for him.  Dermatology had increased his gabapentin to 100 mg 3 times daily.  He is tolerating this.  In the past, perhaps a year or 2 ago when he tried gabapentin through dermatology he woke up with significant hearing loss and was too scared to continue with the gabapentin.  He is tolerating it at this time, no side effects reported.  He feels a little bit discouraged as to the painful scalp treatments he is going through but has been told by his  dermatologist that things are looking better.  His wife also feels that there are patches that are looking much better than they used to.  Let us check on his kidney function from 01/08/2021 showed BUN of 33 and creatinine of 1.26.  He tries to hydrate well with water.  He drinks 1 cup of coffee in the morning and 1 soda during the day.  He has not had any recent falls.  The patient's allergies, current medications, family history, past medical history, past social history, past surgical history and problem list were reviewed and updated as appropriate.      Previously:   I first met him on 01/14/2019 at the request of his primary care physician, at which time he reported a several year history of hand tremors, as long as over 50 years.  He did not have any telltale signs of parkinsonism but I did not favor any symptomatic treatment such as primidone or propranolol secondary to concern for side effects including sedation and balance issues.  We talked about tremor triggers.  He did report quite a bit of stress at the time.     01/14/19: (He) reports a long-standing history of hand tremors for many years. His wife reports that he had hand shaking when they got married which was over 50 years ago. He has no obvious family history of tremors. He has one brother. He has 3 kids. Neither parents had shaking as far as he recalls. He has no family history of Parkinson's disease but  has worried about Parkinson's disease. In the past several months his tremor has become significantly worse. 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 He does endorse significant stressors lately, he worries about his wife's health. He has had significant issues with skin cancers and folliculitis. He has to have hernia surgery. He worries about his wife also, she had a stroke and she may need knee surgery. He has recently stopped taking glimepiride and has been keeping a  log on his blood sugar values. His wife endorses that his blood sugar numbers have been higher. He has not had any recent low blood sugar values but does have jitteriness when he has low blood sugar and sweatiness on his forehead with low blood sugar. His hand tremors of bilateral, has affected his ability to drink from a cup and he avoids drinking hot beverages unless there is a limit on the cup. The tremor also interferes with his ability to write and to feed himself. He is a nonsmoker and does not utilize any alcohol, drinks caffeine in the form of coffee, 1 cup daily and [facility] with lunch.   His Past Medical History Is Significant For: Past Medical History:  Diagnosis Date   Abdominal pain 03/25/2017   Anemia due to chemical agent (HCC)-dermatologic medications most likely 12/08/2019   Atypical chest pain 05/07/2019   Benign essential tremor 12/08/2019   Bilateral lower extremity edema 11/17/2017   Brittle diabetes mellitus ([place]) 01/12/2020   CAD (coronary artery disease) 03/25/2017   Cancer [facility])    SKIN CANCER    Cataract    Chronic head/ "scalp" pain 01/12/2020   Per patient the pain of his scalp is like "bees stinging you all over on your head "he says the slightest touch to the area causes significant pain/discomfort   Costochondritis 05/05/2017   --->pt had mentioned costochondritis at his last office visit with his former PCP prior to transferring care to [place]:  George Lister, MD - 02/24/2017 10:30 AM EDT Formatting of this note may be different from the original. George Barnett is a 87 y.o. male who is here for a follow up visit.  DM: He is taking metformin and glimepiride and tolerates these well. His A1C was 7.1 on recent labs.  HTN: He   Decreased activity 12/08/2019   Dehydration- mild 12/08/2019   Diabetes mellitus without complication (HCC)    Edema, peripheral 11/22/2014   Elbow effusion, left    Erosive pustular dermatosis of scalp 9/56/2130   Folliculitis- scalp; txed by Derm-  Dr Jarome Matin 07/27/2018   Frequency of urination    GAD (generalized anxiety disorder) 12/10/2019   GERD (gastroesophageal reflux disease)    Glossodynia 05/05/2017   [name], MD - 11/22/2016 2:15 PM EST Formatting of this note may be different from the original. Otolaryngology Initial Consultation Note  Requesting Attending Physician: [name], MD Service Requesting Consult: No service for patient encounter.  History of Present Illness:   The patient is being seen in consultation with [name], MD for the evaluation of tongu   Heart attack [facility])    Heart disease    High risk medication use 01/15/2019   Hypertension    Hypertension associated with diabetes ([place]) 08/05/2017   Microalbuminuria due to type 2 diabetes mellitus ([place]) 08/05/2017   Mixed diabetic hyperlipidemia associated with type 2 diabetes mellitus ([place]) 08/05/2017   Orthostatic hypotension 11/05/2018   Primary  osteoarthritis of right shoulder 01/24/2015   Prostate enlargement    Recurrent right inguinal hernia 05/18/2019   RLS (restless legs syndrome) 06/17/2017   Snoring 03/25/2017   Stage 3 chronic kidney disease ([place]) 04/22/2017   Stage 4 chronic kidney disease ([place]) 09/08/2020   Stress and adjustment reaction 01/12/2020   Tremor of both hands-  both wiht rest and activity; acutely worse 11/27/2018   Tremors of nervous system    Type 2 diabetes mellitus with stage 3 chronic kidney disease, without long-term current use of insulin ([place]) 05/05/2017   Urinary retention due to benign prostatic hyperplasia 01/25/2015   Venous insufficiency of both lower extremities 11/27/2018   White coat syndrome with diagnosis of hypertension 03/10/2020    His Past Surgical History Is Significant For: Past Surgical History:  Procedure Laterality Date   CARDIAC CATHETERIZATION     1999   cardio stint     HERNIA REPAIR     BILAT    INGUINAL HERNIA REPAIR  05/18/2019   INGUINAL HERNIA REPAIR Right 05/18/2019    Procedure: REPAIR RIGHT INGUINAL HERNIA WITH MESH;  Surgeon: Erroll Luna, MD;  Location: [place];  Service: General;  Laterality: Right;   MOHS SURGERY     BILAT EARS    PROSTATE BIOPSY     TOTAL SHOULDER ARTHROPLASTY Right 01/24/2015   Procedure: RIGHT TOTAL SHOULDER ARTHROPLASTY;  Surgeon: Marchia Bond, MD;  Location: [place];  Service: Orthopedics;  Laterality: Right;    His Family History Is Significant For: Family History  Problem Relation Age of Onset   Hypertension Brother     His Social History Is Significant For: Social History   Socioeconomic History   Marital status: Married    Spouse name: Not on file   Number of children: Not on file   Years of education: Not on file   Highest education level: Not on file  Occupational History   Not on file  Tobacco Use   Smoking status: Never   Smokeless tobacco: Never  Vaping Use   Vaping Use: Never used  Substance and Sexual Activity   Alcohol use: No   Drug use: No   Sexual activity: Not on file  Other Topics Concern   Not on file  Social History Narrative   Not on file   Social Determinants of Health   Financial Resource Strain: Low Risk    Difficulty of Paying Living Expenses: Not hard at all  Food Insecurity: No Food Insecurity   Worried About Charity fundraiser in the Last Year: Never true   [place] in the Last Year: Never true  Transportation Needs: No Transportation Needs   Lack of Transportation (Medical): No   Lack of Transportation (Non-Medical): No  Physical Activity: Inactive   Days of Exercise per Week: 0 days   Minutes of Exercise per Session: 0 min  Stress: No Stress Concern Present   Feeling of Stress : Only a little  Social Connections: Moderately Integrated   Frequency of Communication with Friends and Family: More than three times a week   Frequency of Social Gatherings with Friends and Family: Three times a week   Attends Religious Services: More than 4 times per year   Active  Member of Clubs or Organizations: No   Attends Archivist Meetings: Never   Marital Status: Married    His Allergies Are:  Allergies  Allergen Reactions   Exenatide Nausea And Vomiting   Meperidine And Related Other (  See Comments)    Red streak, that happened once while having an IV   Nadolol Other (See Comments)    Numbness in fingers   Sitagliptin Phosphate [Sitagliptin] Other (See Comments)    Weight loss   :   His Current Medications Are:  Outpatient Encounter Medications as of 05/03/2021  Medication Sig   aspirin 81 MG tablet Take 81 mg by mouth every evening.    atorvastatin (LIPITOR) 10 MG tablet TAKE 1 TABLET EVERY NIGHT  BEFORE BED. (Patient taking differently: Take 10 mg by mouth daily. TAKE 1 TABLET EVERY other night before bed)   Cobalamine Combinations (P-80 + FOLIC ACID PO) Take 1 capsule by mouth every evening. Unknown strength   empagliflozin (JARDIANCE) 10 MG TABS tablet Take 1 tablet (10 mg total) by mouth daily before breakfast.   furosemide (LASIX) 20 MG tablet Take 1 tablet (20 mg total) by mouth daily.   gabapentin (NEURONTIN) 100 MG capsule Take 1 capsule (100 mg total) by mouth at bedtime. (Patient taking differently: Take 100 mg by mouth 3 (three) times daily.)   gentian violet 2 % topical solution Use as directed 0.5 mLs in the mouth or throat 3 (three) times daily.   glimepiride (AMARYL) 1 MG tablet Take 1 tablet (1 mg total) by mouth daily with breakfast.   losartan (COZAAR) 25 MG tablet Take 1 tablet (25 mg total) by mouth daily.   metFORMIN (GLUCOPHAGE) 500 MG tablet Take 1 tablet (500 mg total) by mouth 2 (two) times daily with a meal.   Polyethyl Glycol-Propyl Glycol (SYSTANE OP) Place 1 drop into both eyes daily. Unknown stregth   polyethylene glycol (MIRALAX / GLYCOLAX) packet Take 17 g by mouth daily as needed for mild constipation.    Polyethylene Glycol 400 (BLINK TEARS OP) Apply 1 drop to eye as needed (eye dryness).   Probiotic Product  (PROBIOTIC DAILY PO) Take 1 capsule by mouth daily with lunch. Unknown strength   tacrolimus (PROTOPIC) 0.1 % ointment Apply 1 application topically 2 (two) times daily.   No facility-administered encounter medications on file as of 05/03/2021.  :  Review of Systems:  Out of a complete 14 point review of systems, all are reviewed and negative with the exception of these symptoms as listed below:  Review of Systems  Neurological:        Here for f/u on tremors reports sx are the same since last visit.    Objective:  Neurological Exam  Physical Exam Physical Examination:   Vitals:   05/03/21 1018  BP: (!) 142/76  Pulse: 63    General Examination: The patient is a very pleasant 87 y.o. male in no acute distress. He appears well-developed and well-nourished and well groomed.   HEENT: Normocephalic, atraumatic, pupils are equal, round and reactive to light, extraocular tracking is well-preserved, he is status post cataract repairs, he has hearing aids.  Corrective eyeglasses in place.  He has no lip, neck or jaw tremor, no voice tremor.  Neck is supple with full range of passive motion.  Scalp is covered with a soft bandage. Airway examination reveals nonfocal findings, mild mouth dryness is noted. Tongue protrudes centrally and palate elevates symmetrically. He has no significant hypophonia or dysarthria. No carotid bruits.   Chest: Clear to auscultation without wheezing, rhonchi or crackles noted.   Heart: S1+S2+0, regular and normal without murmurs, rubs or gallops noted.   Abdomen: Soft, non-tender and non-distended.   Extremities: There is  trace edema in the  distal lower extremities bilaterally.   Skin: Warm and dry, with chronic appearing bruising on his arms, and multiple circular skin lesions from dermatological procedures on the forearms.   Musculoskeletal: exam reveals no obvious joint deformities.   Neurologically: Mental status: The patient is awake, alert and  oriented in all 4 spheres. His immediate and remote memory, attention, language skills and fund of knowledge are appropriate. There is no evidence of aphasia, agnosia, apraxia or anomia. Speech is clear with normal prosody and enunciation. Thought process is linear. Mood is normal and affect is normal. Cranial nerves II - XII are as described above under HEENT exam.  Motor exam: thin bulk, global strength is normal for age, he has a normal tone. He has a slight degree of resting tremor intermittently in both hands, left more than right, stable. He has a mild to moderate postural tremor in both hands and a mild action tremor in both hands, no significant lateralization and seems slightly better compared to last time.     (On 01/14/2019: On Archimedes spiral drawing he has slight trembling with the right hand which is his dominant hand, with the left hand he has moderate trembling. Handwriting with the right hand is difficult to read, on the smaller side and tremulous.)   Fine motor skill testing: he has no significant decrement in amplitude with fine motor testing.  Globally mild impairment of fine motor control noted, stable.  Cerebellar testing: No dysmetria or intention tremor on finger to nose testing. Heel to shin is unremarkable bilaterally. There is no truncal or gait ataxia. Sensory exam: intact to light touch. Gait, station and balance: He stands without difficulty, his posture is slightly stooped but appears appropriate for age.  He walks with preserved arm swing and no shuffling is noted.  Balance is overall slightly impaired, seems stable.  He turns slowly and cautiously.  He has no walking aid.     Assessment and Plan:    In summary, George Barnett is a very pleasant 87 year old male with an underlying medical history of diabetes, hypertension, skin cancers, chronic kidney disease, hyperlipidemia, venous insufficiency, and history of orthostatic hypotension, who presents for follow-up  consultation of his longstanding history of hand tremors with history and examination in keeping with essential tremor.  He has experienced progression over time.  His tremor also flared up when he started his scalp treatments.  These are very painful for him.  He has to also have a regular dressing and cleaning of his scalp and tries to rinse and clean his scalp daily.  This is very painful and he reports that the tremor flares up in anticipation of scalp treatments and cleaning.  He has been on gabapentin, we started low-dose with 100 mg at bedtime only, dermatologist increased it to 100 mg 3 times daily at this time.  It has not made a whole lot of difference for the scalp pain but perhaps has improved the tremor a little bit.  I do believe his tremor is just ever so slightly better as compared to last time.  He is advised to continue with the current prescription of gabapentin which he has through the dermatologist.  There is room to increase it potentially very cautiously, he does have chronic kidney disease and is advised to be mindful of this.  He is advised to talk to his dermatologist about this at this time.  He is advised to follow-up routinely for tremor recheck in this clinic to see one of  our nurse practitioners in about 6 months.  I answered all their questions today and the patient and his wife were in agreement. I spent 30 minutes in total face-to-face time and in reviewing records during pre-charting, more than 50% of which was spent in counseling and coordination of care, reviewing test results, reviewing medications and treatment regimen and/or in discussing or reviewing the diagnosis of ET, the prognosis and treatment options. Pertinent laboratory and imaging test results that were available during this visit with the patient were reviewed by me and considered in my medical decision making (see chart for details).

## 2021-05-03 NOTE — Patient Instructions (Signed)
It was nice to see you again today.  I hope your scalp treatments are successful and you get some relief soon.  Please continue with the gabapentin 100 mg 3 times daily at this time as per dermatology.  You could talk to them about gently increasing it but keep in mind that you do have chronic kidney impairment.   Please follow-up with one of our nurse practitioners routinely for tremor recheck in about 6 months.  I do believe your tremor looks just a little bit better today.

## 2021-05-08 ENCOUNTER — Encounter: Payer: Self-pay | Admitting: Family Medicine

## 2021-05-08 DIAGNOSIS — L988 Other specified disorders of the skin and subcutaneous tissue: Secondary | ICD-10-CM

## 2021-05-16 ENCOUNTER — Other Ambulatory Visit: Payer: Self-pay

## 2021-05-16 ENCOUNTER — Ambulatory Visit (INDEPENDENT_AMBULATORY_CARE_PROVIDER_SITE_OTHER): Payer: Medicare Other

## 2021-05-16 DIAGNOSIS — I2510 Atherosclerotic heart disease of native coronary artery without angina pectoris: Secondary | ICD-10-CM

## 2021-05-16 LAB — ECHOCARDIOGRAM COMPLETE
Area-P 1/2: 2.91 cm2
S' Lateral: 2.8 cm

## 2021-05-16 NOTE — Progress Notes (Signed)
Complete echocardiogram performed.  Jimmy Ayannah Faddis RDCS, RVT  

## 2021-05-23 ENCOUNTER — Telehealth: Payer: Self-pay

## 2021-05-23 NOTE — Telephone Encounter (Signed)
-----   Message from Park Liter, MD sent at 05/22/2021  4:52 PM EDT ----- Echocardiogram showed normal left ventricle ejection fraction, mild mitral valve regurgitation, overall looks good

## 2021-05-23 NOTE — Telephone Encounter (Signed)
Spoke with patient regarding results and recommendation.  Patient verbalizes understanding and is agreeable to plan of care. Advised patient to call back with any issues or concerns.  

## 2021-06-01 DIAGNOSIS — L988 Other specified disorders of the skin and subcutaneous tissue: Secondary | ICD-10-CM | POA: Diagnosis not present

## 2021-06-01 DIAGNOSIS — L820 Inflamed seborrheic keratosis: Secondary | ICD-10-CM | POA: Diagnosis not present

## 2021-06-01 DIAGNOSIS — D229 Melanocytic nevi, unspecified: Secondary | ICD-10-CM | POA: Diagnosis not present

## 2021-06-01 DIAGNOSIS — Z85828 Personal history of other malignant neoplasm of skin: Secondary | ICD-10-CM | POA: Diagnosis not present

## 2021-06-01 DIAGNOSIS — L821 Other seborrheic keratosis: Secondary | ICD-10-CM | POA: Diagnosis not present

## 2021-06-01 DIAGNOSIS — L570 Actinic keratosis: Secondary | ICD-10-CM | POA: Diagnosis not present

## 2021-06-01 DIAGNOSIS — Z79899 Other long term (current) drug therapy: Secondary | ICD-10-CM | POA: Diagnosis not present

## 2021-06-01 DIAGNOSIS — D1801 Hemangioma of skin and subcutaneous tissue: Secondary | ICD-10-CM | POA: Diagnosis not present

## 2021-06-27 DIAGNOSIS — N401 Enlarged prostate with lower urinary tract symptoms: Secondary | ICD-10-CM | POA: Diagnosis not present

## 2021-06-27 DIAGNOSIS — R351 Nocturia: Secondary | ICD-10-CM | POA: Diagnosis not present

## 2021-06-27 DIAGNOSIS — N281 Cyst of kidney, acquired: Secondary | ICD-10-CM | POA: Diagnosis not present

## 2021-07-12 ENCOUNTER — Other Ambulatory Visit: Payer: Self-pay | Admitting: Family Medicine

## 2021-07-12 DIAGNOSIS — E1122 Type 2 diabetes mellitus with diabetic chronic kidney disease: Secondary | ICD-10-CM

## 2021-07-12 DIAGNOSIS — N183 Chronic kidney disease, stage 3 unspecified: Secondary | ICD-10-CM

## 2021-07-13 DIAGNOSIS — D0439 Carcinoma in situ of skin of other parts of face: Secondary | ICD-10-CM | POA: Diagnosis not present

## 2021-07-13 DIAGNOSIS — D485 Neoplasm of uncertain behavior of skin: Secondary | ICD-10-CM | POA: Diagnosis not present

## 2021-07-13 DIAGNOSIS — L988 Other specified disorders of the skin and subcutaneous tissue: Secondary | ICD-10-CM | POA: Diagnosis not present

## 2021-07-13 DIAGNOSIS — L309 Dermatitis, unspecified: Secondary | ICD-10-CM | POA: Diagnosis not present

## 2021-07-13 DIAGNOSIS — D0462 Carcinoma in situ of skin of left upper limb, including shoulder: Secondary | ICD-10-CM | POA: Diagnosis not present

## 2021-07-13 DIAGNOSIS — D0430 Carcinoma in situ of skin of unspecified part of face: Secondary | ICD-10-CM | POA: Diagnosis not present

## 2021-07-13 DIAGNOSIS — L570 Actinic keratosis: Secondary | ICD-10-CM | POA: Diagnosis not present

## 2021-07-16 ENCOUNTER — Ambulatory Visit: Payer: Medicare Other | Admitting: Family Medicine

## 2021-07-17 ENCOUNTER — Other Ambulatory Visit: Payer: Self-pay | Admitting: Family Medicine

## 2021-07-17 DIAGNOSIS — E119 Type 2 diabetes mellitus without complications: Secondary | ICD-10-CM

## 2021-07-18 ENCOUNTER — Other Ambulatory Visit: Payer: Self-pay

## 2021-07-18 ENCOUNTER — Encounter: Payer: Self-pay | Admitting: Family Medicine

## 2021-07-18 ENCOUNTER — Ambulatory Visit (INDEPENDENT_AMBULATORY_CARE_PROVIDER_SITE_OTHER): Payer: Medicare Other | Admitting: Family Medicine

## 2021-07-18 VITALS — BP 106/60 | HR 70 | Temp 97.4°F | Ht 72.0 in | Wt 160.8 lb

## 2021-07-18 DIAGNOSIS — I152 Hypertension secondary to endocrine disorders: Secondary | ICD-10-CM

## 2021-07-18 DIAGNOSIS — E782 Mixed hyperlipidemia: Secondary | ICD-10-CM | POA: Diagnosis not present

## 2021-07-18 DIAGNOSIS — L988 Other specified disorders of the skin and subcutaneous tissue: Secondary | ICD-10-CM

## 2021-07-18 DIAGNOSIS — E1169 Type 2 diabetes mellitus with other specified complication: Secondary | ICD-10-CM

## 2021-07-18 DIAGNOSIS — E1159 Type 2 diabetes mellitus with other circulatory complications: Secondary | ICD-10-CM

## 2021-07-18 DIAGNOSIS — G250 Essential tremor: Secondary | ICD-10-CM

## 2021-07-18 DIAGNOSIS — B353 Tinea pedis: Secondary | ICD-10-CM

## 2021-07-18 DIAGNOSIS — E119 Type 2 diabetes mellitus without complications: Secondary | ICD-10-CM

## 2021-07-18 MED ORDER — GABAPENTIN 100 MG PO CAPS: 100.0000 mg | ORAL_CAPSULE | Freq: Three times a day (TID) | ORAL | 3 refills | Status: DC

## 2021-07-18 MED ORDER — ATORVASTATIN CALCIUM 10 MG PO TABS: 10.0000 mg | ORAL_TABLET | ORAL | 3 refills | Status: DC

## 2021-07-18 MED ORDER — LOSARTAN POTASSIUM 25 MG PO TABS: 25.0000 mg | ORAL_TABLET | Freq: Every day | ORAL | 3 refills | Status: DC

## 2021-07-18 MED ORDER — METFORMIN HCL 500 MG PO TABS: 500.0000 mg | ORAL_TABLET | Freq: Two times a day (BID) | ORAL | 3 refills | Status: DC

## 2021-07-18 NOTE — Progress Notes (Signed)
[facility] [address] [place] [address] Dept: [phone] Dept Fax: [phone]  Chronic Care Office Visit  Subjective:    Patient ID: George Barnett, male    DOB: [birth year], 87 y.o..   MRN: [id number]  Chief Complaint  Patient presents with   Diabetes   Hypertension   Hyperlipidemia   History of Present Illness:  Patient is in today for reassessment of chronic medical issues.  Mr. Canela continues to see dermatology related to his erosive pustular dermatosis. He is currently on MTX, tacrolimus and painting the area with gentian violet. This continues to be quite painful for him. His MTX dose was recently increased. He thinks this may be doing slightly better. He is also being seen by the dermatologist for several SCC lesions and has an upcoming Moh's procedure.   Mr. Kise continue to see Dr. Rexene Alberts (neurology) in regards to his tremor. This was not found to be a sign of parkinsonism. He was remains on gabapentin, now three times a day. Mr. Skora is not sure there is any difference in the tremor, but he does feel he is sleeping better.   Mr. Montellano has a history of Type 2 DM, hypertension, and hyperlipidemia. He is monitoring his blood sugars at home. He takes metformin and glimiperide for this, and at his last visit, we added Jardiance to his regimen. Recently, his values have been 120-140s fasting.  He is on losartan for blood pressure control and atorvastatin for his lipids.  Past Medical History: Patient Active Problem List   Diagnosis Date Noted   Squamous cell carcinoma of antihelix, right 11/02/2020   Prostate enlargement    History of myocardial infarction    Elbow effusion, left    Cataract    Stage 4 chronic kidney disease ([place]) 09/08/2020   White coat syndrome with diagnosis of hypertension 03/10/2020   Stress and adjustment reaction 01/12/2020   Chronic head/ "scalp" pain 01/12/2020   Anemia  due to chemical agent (HCC)-dermatologic medications most likely 12/08/2019   Erosive pustular dermatosis of scalp 12/08/2019   Benign essential tremor 12/08/2019   Vitamin D deficiency 12/08/2019   Recurrent right inguinal hernia 05/18/2019   Atypical chest pain 05/07/2019   Venous insufficiency of both lower extremities 11/27/2018   Tremor of both hands-  both wiht rest and activity; acutely worse 11/27/2018   Orthostatic hypotension 11/05/2018   Bilateral lower extremity edema 11/17/2017   Hypertension associated with diabetes ([place]) 08/05/2017   Mixed diabetic hyperlipidemia associated with type 2 diabetes mellitus ([place]) 08/05/2017   Microalbuminuria due to type 2 diabetes mellitus ([place]) 08/05/2017   RLS (restless legs syndrome) 06/17/2017   GERD (gastroesophageal reflux disease) 05/05/2017   Glossodynia 05/05/2017   Costochondritis 05/05/2017   Type 2 diabetes mellitus with stage 3 chronic kidney disease, without long-term current use of insulin ([place]) 05/05/2017   CAD (coronary artery disease) 03/25/2017   Snoring 03/25/2017   Urinary retention due to benign prostatic hyperplasia 01/25/2015   Primary osteoarthritis of right shoulder 01/24/2015   Past Surgical History:  Procedure Laterality Date   CARDIAC CATHETERIZATION     1999   cardio stint     HERNIA REPAIR     BILAT    INGUINAL HERNIA REPAIR  05/18/2019   INGUINAL HERNIA REPAIR Right 05/18/2019   Procedure: REPAIR RIGHT INGUINAL HERNIA WITH MESH;  Surgeon: Erroll Luna, MD;  Location: [facility];  Service: General;  Laterality: Right;   MOHS SURGERY  BILAT EARS    PROSTATE BIOPSY     TOTAL SHOULDER ARTHROPLASTY Right 01/24/2015   Procedure: RIGHT TOTAL SHOULDER ARTHROPLASTY;  Surgeon: Marchia Bond, MD;  Location: [facility];  Service: Orthopedics;  Laterality: Right;   Family History  Problem Relation Age of Onset   Hypertension Brother    Outpatient Medications Prior to Visit  Medication Sig Dispense Refill   aspirin  81 MG tablet Take 81 mg by mouth every evening.      folic acid (FOLVITE) 1 MG tablet Take 1 mg by mouth daily. except Mondays     furosemide (LASIX) 20 MG tablet Take 1 tablet (20 mg total) by mouth daily. 90 tablet 1   gentian violet 2 % topical solution Use as directed 0.5 mLs in the mouth or throat 3 (three) times daily.     glimepiride (AMARYL) 1 MG tablet TAKE 1 TABLET DAILY WITH   BREAKFAST 90 tablet 1   JARDIANCE 10 MG TABS tablet TAKE 1 TABLET BY MOUTH DAILY BEFORE BREAKFAST. 30 tablet 2   methotrexate (RHEUMATREX) 2.5 MG tablet Take 15 mg by mouth once a week.     Polyethyl Glycol-Propyl Glycol (SYSTANE OP) Place 1 drop into both eyes daily. Unknown stregth     polyethylene glycol (MIRALAX / GLYCOLAX) packet Take 17 g by mouth daily as needed for mild constipation.      Probiotic Product (PROBIOTIC DAILY PO) Take 1 capsule by mouth daily with lunch. Unknown strength     atorvastatin (LIPITOR) 10 MG tablet TAKE 1 TABLET EVERY NIGHT  BEFORE BED. (Patient taking differently: Take 10 mg by mouth daily. TAKE 1 TABLET EVERY other night before bed) 90 tablet 1   gabapentin (NEURONTIN) 100 MG capsule Take 1 capsule (100 mg total) by mouth at bedtime. (Patient taking differently: Take 100 mg by mouth 3 (three) times daily.) 30 capsule 3   losartan (COZAAR) 25 MG tablet Take 1 tablet (25 mg total) by mouth daily. 90 tablet 1   metFORMIN (GLUCOPHAGE) 500 MG tablet Take 1 tablet (500 mg total) by mouth 2 (two) times daily with a meal. 180 tablet 1   tacrolimus (PROTOPIC) 0.1 % ointment Apply 1 application topically 2 (two) times daily. (Patient not taking: Reported on 07/18/2021)     Cobalamine Combinations (B-15 + FOLIC ACID PO) Take 1 capsule by mouth every evening. Unknown strength     Polyethylene Glycol 400 (BLINK TEARS OP) Apply 1 drop to eye as needed (eye dryness). (Patient not taking: Reported on 07/18/2021)     No facility-administered medications prior to visit.   Allergies  Allergen  Reactions   Exenatide Nausea And Vomiting   Meperidine And Related Other (See Comments)    Red streak, that happened once while having an IV   Nadolol Other (See Comments)    Numbness in fingers   Sitagliptin Phosphate [Sitagliptin] Other (See Comments)    Weight loss     Objective:   Today's Vitals   07/18/21 1301  BP: 106/60  Pulse: 70  Temp: (!) 97.4 F (36.3 C)  TempSrc: Temporal  SpO2: 96%  Weight: 160 lb 12.8 oz (72.9 kg)  Height: 6' (1.829 m)   Body mass index is 21.81 kg/m.   General: Well developed, well nourished. No acute distress. Feet: Both ankles with puffy edema present. DP pulses are hard to appreciate with the swelling   present.  The skin is intact. There are multiple spider varicosities present. There is some scaliness   between  several of the toes and around the nails. 5.07 mm monofilament testing normal except for   over the ball on the left. Psych: Alert and oriented. Normal mood and affect.  Health Maintenance Due  Topic Date Due   Zoster Vaccines- Shingrix (1 of 2) Never done   TETANUS/TDAP  04/25/2020   FOOT EXAM  06/09/2021   COVID-19 Vaccine (5 - Booster for Pfizer series) 07/01/2021   INFLUENZA VACCINE  06/18/2021   Lab Results Lab Results  Component Value Date   CHOL 94 01/08/2021   HDL 52.20 01/08/2021   LDLCALC 30 01/08/2021   TRIG 57.0 01/08/2021   CHOLHDL 2 01/08/2021   Lab Results  Component Value Date   HGBA1C 7.4 (H) 04/13/2021     Assessment & Plan:   1. Type 2 diabetes mellitus with other specified complication, without long-term current use of insulin ([place]) I will continue Mr. Metoyer on metformin, glimiperide, and empagliflozin. We will check his A1c and glucose today.  - metFORMIN (GLUCOPHAGE) 500 MG tablet; Take 1 tablet (500 mg total) by mouth 2 (two) times daily with a meal.  Dispense: 180 tablet; Refill: 3 - Glucose, random - Hemoglobin A1c  2. Mixed diabetic hyperlipidemia associated with type 2 diabetes  mellitus ([place]) At goal on atorvastatin. I will check this again in Feb.  - atorvastatin (LIPITOR) 10 MG tablet; Take 1 tablet (10 mg total) by mouth every other day.  Dispense: 45 tablet; Refill: 3  3. Hypertension associated with diabetes ([place]) Blood pressure at goal. Continue losartan.  - losartan (COZAAR) 25 MG tablet; Take 1 tablet (25 mg total) by mouth daily.  Dispense: 90 tablet; Refill: 3  4. Erosive pustular dermatosis of scalp Stable. He will continue his current dose of gabapentin.  - gabapentin (NEURONTIN) 100 MG capsule; Take 1 capsule (100 mg total) by mouth 3 (three) times daily.  Dispense: 270 capsule; Refill: 3  5. Benign essential tremor Not really improved with gabapentin. He will continue to follow with neurology.  6. Tinea pedis of both feet Recommend he use OTC athlete's foot cream (antifungal) to resolve mild infection.   Haydee Salter, MD

## 2021-07-19 LAB — GLUCOSE, RANDOM: Glucose, Bld: 193 mg/dL — ABNORMAL HIGH (ref 70–99)

## 2021-07-19 LAB — HEMOGLOBIN A1C: Hgb A1c MFr Bld: 7.8 % — ABNORMAL HIGH (ref 4.6–6.5)

## 2021-07-20 ENCOUNTER — Encounter: Payer: Self-pay | Admitting: Family Medicine

## 2021-07-20 MED ORDER — EMPAGLIFLOZIN 25 MG PO TABS: 25.0000 mg | ORAL_TABLET | Freq: Every day | ORAL | 3 refills | Status: DC

## 2021-07-20 NOTE — Addendum Note (Signed)
Addended by: Haydee Salter on: 07/20/2021 04:34 PM   Modules accepted: Orders

## 2021-08-02 DIAGNOSIS — Z23 Encounter for immunization: Secondary | ICD-10-CM | POA: Diagnosis not present

## 2021-08-09 DIAGNOSIS — D0462 Carcinoma in situ of skin of left upper limb, including shoulder: Secondary | ICD-10-CM | POA: Diagnosis not present

## 2021-08-09 DIAGNOSIS — D0439 Carcinoma in situ of skin of other parts of face: Secondary | ICD-10-CM | POA: Diagnosis not present

## 2021-08-16 DIAGNOSIS — C760 Malignant neoplasm of head, face and neck: Secondary | ICD-10-CM | POA: Diagnosis not present

## 2021-08-16 DIAGNOSIS — Z4802 Encounter for removal of sutures: Secondary | ICD-10-CM | POA: Diagnosis not present

## 2021-08-20 ENCOUNTER — Other Ambulatory Visit: Payer: Self-pay

## 2021-08-21 ENCOUNTER — Ambulatory Visit (INDEPENDENT_AMBULATORY_CARE_PROVIDER_SITE_OTHER): Payer: Medicare Other | Admitting: Family Medicine

## 2021-08-21 ENCOUNTER — Encounter: Payer: Self-pay | Admitting: Family Medicine

## 2021-08-21 VITALS — BP 120/78 | HR 77 | Temp 98.0°F | Ht 72.0 in | Wt 155.2 lb

## 2021-08-21 DIAGNOSIS — N5082 Scrotal pain: Secondary | ICD-10-CM | POA: Diagnosis not present

## 2021-08-21 MED ORDER — TRIAMCINOLONE ACETONIDE 0.1 % EX CREA: 1.0000 "application " | TOPICAL_CREAM | Freq: Two times a day (BID) | CUTANEOUS | 0 refills | Status: DC

## 2021-08-21 NOTE — Progress Notes (Signed)
[facility] [address] [place] [address] Dept: [phone] Dept Fax: [phone]  Office Visit  Subjective:    Patient ID: George Barnett, male    DOB: August 04, 1933, 87 y.o..   MRN: [id number]  Chief Complaint  Patient presents with   Follow-up    F/u medication issue with Jardiance. C/o haivng a rough spot on scrotum that has gotten better but his under wear tou    History of Present Illness:  Patient is in today for evaluation of a painful area on his scrotum. Mr. Romberger notes her first noted a crusty area on the left scrotum about 2-3 weeks ago. The crust has resolved, but he continues to have a pinching sensation in that area. He has not noted other rash. He was concerned, as he had a dosage increase in his Jardiance just prior to this arising. He had noted that genital infections were more common for patients on Jardiance.  Past Medical History: Patient Active Problem List   Diagnosis Date Noted   Squamous cell carcinoma of antihelix, right 11/02/2020   Prostate enlargement    History of myocardial infarction    Elbow effusion, left    Cataract    Stage 4 chronic kidney disease ([place]) 09/08/2020   White coat syndrome with diagnosis of hypertension 03/10/2020   Stress and adjustment reaction 01/12/2020   Chronic head/ "scalp" pain 01/12/2020   Anemia due to chemical agent (HCC)-dermatologic medications most likely 12/08/2019   Erosive pustular dermatosis of scalp 12/08/2019   Benign essential tremor 12/08/2019   Vitamin D deficiency 12/08/2019   Recurrent right inguinal hernia 05/18/2019   Atypical chest pain 05/07/2019   Venous insufficiency of both lower extremities 11/27/2018   Tremor of both hands-  both wiht rest and activity; acutely worse 11/27/2018   Orthostatic hypotension 11/05/2018   Bilateral lower extremity edema 11/17/2017   Hypertension associated with diabetes ([place]) 08/05/2017   Mixed  diabetic hyperlipidemia associated with type 2 diabetes mellitus ([place]) 08/05/2017   Microalbuminuria due to type 2 diabetes mellitus ([place]) 08/05/2017   RLS (restless legs syndrome) 06/17/2017   GERD (gastroesophageal reflux disease) 05/05/2017   Glossodynia 05/05/2017   Costochondritis 05/05/2017   Type 2 diabetes mellitus with stage 3 chronic kidney disease, without long-term current use of insulin ([place]) 05/05/2017   CAD (coronary artery disease) 03/25/2017   Snoring 03/25/2017   Urinary retention due to benign prostatic hyperplasia 01/25/2015   Primary osteoarthritis of right shoulder 01/24/2015   Past Surgical History:  Procedure Laterality Date   CARDIAC CATHETERIZATION     1999   cardio stint     HERNIA REPAIR     BILAT    INGUINAL HERNIA REPAIR  05/18/2019   INGUINAL HERNIA REPAIR Right 05/18/2019   Procedure: REPAIR RIGHT INGUINAL HERNIA WITH MESH;  Surgeon: Erroll Luna, MD;  Location: [facility];  Service: General;  Laterality: Right;   MOHS SURGERY     BILAT EARS    PROSTATE BIOPSY     TOTAL SHOULDER ARTHROPLASTY Right 01/24/2015   Procedure: RIGHT TOTAL SHOULDER ARTHROPLASTY;  Surgeon: Marchia Bond, MD;  Location: [facility];  Service: Orthopedics;  Laterality: Right;   Family History  Problem Relation Age of Onset   Hypertension Brother    Outpatient Medications Prior to Visit  Medication Sig Dispense Refill   aspirin 81 MG tablet Take 81 mg by mouth every evening.      atorvastatin (LIPITOR) 10 MG tablet Take 1 tablet (10  mg total) by mouth every other day. 45 tablet 3   [employer] PAIN RELIEF 4 % CREA Apply topically as needed.     empagliflozin (JARDIANCE) 25 MG TABS tablet Take 1 tablet (25 mg total) by mouth daily before breakfast. 30 tablet 3   folic acid (FOLVITE) 1 MG tablet Take 1 mg by mouth daily. except Mondays     furosemide (LASIX) 20 MG tablet Take 1 tablet (20 mg total) by mouth daily. 90 tablet 1   gabapentin (NEURONTIN) 100 MG capsule Take 1 capsule (100 mg  total) by mouth 3 (three) times daily. 270 capsule 3   gentian violet 2 % topical solution Use as directed 0.5 mLs in the mouth or throat 3 (three) times daily.     glimepiride (AMARYL) 1 MG tablet TAKE 1 TABLET DAILY WITH   BREAKFAST 90 tablet 1   losartan (COZAAR) 25 MG tablet Take 1 tablet (25 mg total) by mouth daily. 90 tablet 3   metFORMIN (GLUCOPHAGE) 500 MG tablet Take 1 tablet (500 mg total) by mouth 2 (two) times daily with a meal. 180 tablet 3   methotrexate (RHEUMATREX) 2.5 MG tablet Take 15 mg by mouth once a week.     mupirocin ointment (BACTROBAN) 2 % APPLY TOPICALLY DAILY. TO OPEN SORES ON SCALP     Polyethyl Glycol-Propyl Glycol (SYSTANE OP) Place 1 drop into both eyes daily. Unknown stregth     polyethylene glycol (MIRALAX / GLYCOLAX) packet Take 17 g by mouth daily as needed for mild constipation.      predniSONE (DELTASONE) 10 MG tablet Take 4 PO QAM x 5 days, 3 PO QAM x 5 days, 2 PO QAM x 5 days, 1 PO QAM x 5 days, 1/2 PO QAM x 5 days then STOP     Probiotic Product (PROBIOTIC DAILY PO) Take 1 capsule by mouth daily with lunch. Unknown strength     tacrolimus (PROTOPIC) 0.1 % ointment Apply 1 application topically 2 (two) times daily.     No facility-administered medications prior to visit.   Allergies  Allergen Reactions   Exenatide Nausea And Vomiting   Meperidine And Related Other (See Comments)    Red streak, that happened once while having an IV   Nadolol Other (See Comments)    Numbness in fingers   Sitagliptin Phosphate [Sitagliptin] Other (See Comments)    Weight loss     Objective:   Today's Vitals   08/21/21 1132  BP: 120/78  Pulse: 77  Temp: 98 F (36.7 C)  TempSrc: Temporal  SpO2: 98%  Weight: 155 lb 3.2 oz (70.4 kg)  Height: 6' (1.829 m)   Body mass index is 21.05 kg/m.   General: Well developed, well nourished. No acute distress. GU: Normal male external genitalia. Circumcised. Testicles are soft and of normal size. The testicles are  nontender,   including the epididymis and the vas deferens. There are no varices noted. The skin of the scrotum appears normal,  without rash and there is no palpable lesion in the scrotal wall. There is some scaliness in the pubic area around the   hair follicles, but no associated redness. Psych: Alert and oriented. Normal mood and affect.  Health Maintenance Due  Topic Date Due   Zoster Vaccines- Shingrix (1 of 2) Never done   TETANUS/TDAP  04/25/2020   INFLUENZA VACCINE  06/18/2021   COVID-19 Vaccine (5 - Booster for Pfizer series) 07/01/2021     Assessment & Plan:   1. Scrotal  pain The etiology of the scrotal pain is unclear at this point, as the exam is essentially normal. I recommended he use some lotion in the pubic area to try and resolve the scaliness. I recommend he use TAC cream to the area of the scrotum with the pinching sensation and see if this resolves. Although Jardiance can be associated with genitourinary fungal infections, I see no evidence of a fungal infection currently, so feel he is okay to continue on the new Jardiance dose. We will reassess this at his next visit.  - triamcinolone cream (KENALOG) 0.1 %; Apply 1 application topically 2 (two) times daily.  Dispense: 30 g; Refill: 0  Haydee Salter, MD

## 2021-08-27 ENCOUNTER — Encounter (HOSPITAL_COMMUNITY): Payer: Self-pay

## 2021-08-27 ENCOUNTER — Other Ambulatory Visit: Payer: Self-pay

## 2021-08-27 ENCOUNTER — Ambulatory Visit (HOSPITAL_COMMUNITY)
Admission: EM | Admit: 2021-08-27 | Discharge: 2021-08-27 | Disposition: A | Discharge status: Home / Self Care | Payer: Medicare Other | Attending: Internal Medicine | Admitting: Internal Medicine

## 2021-08-27 DIAGNOSIS — T8149XA Infection following a procedure, other surgical site, initial encounter: Secondary | ICD-10-CM | POA: Diagnosis not present

## 2021-08-27 MED ORDER — CEPHALEXIN 500 MG PO CAPS: 500.0000 mg | ORAL_CAPSULE | Freq: Three times a day (TID) | ORAL | 0 refills | Status: AC

## 2021-08-27 NOTE — Discharge Instructions (Addendum)
Please apply antibiotic ointments to the areas involved No need for occlusive dressing If you have worsening symptoms please call your dermatologist to be reevaluated.

## 2021-08-27 NOTE — ED Triage Notes (Signed)
Pt presents for wound check on left forearm around site where he had squamous cell carcinoma area frozen off at dermatology on 08/09/2021.

## 2021-08-27 NOTE — ED Provider Notes (Signed)
[facility]    CSN: [id number] Arrival date & time: 08/27/21  5956      History   Chief Complaint Chief Complaint  Patient presents with   Wound Check    HPI George Barnett is a 87 y.o. male comes to the urgent care for wound check.  Patient underwent cryotherapy for squamous cell carcinoma of the skin on 08/09/2021.  Following that patient has noticed increased redness spreading away from the edges of the wound.  No fever or chills.  No discharge from the wound.  Patient is concerned that it may be infected.   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 George Barnett is a 87 y.o. male who is here for a follow up visit.  DM: He is taking metformin and glimepiride and tolerates these well. His A1C was 7.1 on recent labs.  HTN: He   Decreased activity 12/08/2019   Dehydration- mild 12/08/2019   Diabetes mellitus without complication (HCC)    Edema, peripheral 11/22/2014   Elbow effusion, left    Erosive pustular dermatosis of scalp 3/87/5643   Folliculitis- scalp; txed by Derm- Dr Jarome Matin 07/27/2018   Frequency of urination    GAD (generalized anxiety disorder) 12/10/2019    GERD (gastroesophageal reflux disease)    Glossodynia 05/05/2017   [name], Barnett - 11/22/2016 2:15 PM EST Formatting of this note may be different from the original. Otolaryngology Initial Consultation Note  Requesting Attending Physician: [name], Barnett Service Requesting Consult: No service for patient encounter.  History of Present Illness:   The patient is being seen in consultation with [name], Barnett for the evaluation of tongu   Heart attack [facility])    Heart disease    High risk medication use 01/15/2019   Hypertension    Hypertension associated with diabetes ([place]) 08/05/2017   Microalbuminuria due to type 2 diabetes mellitus ([place]) 08/05/2017   Mixed diabetic hyperlipidemia associated with type 2 diabetes mellitus ([place]) 08/05/2017   Orthostatic hypotension 11/05/2018   Primary osteoarthritis of right shoulder 01/24/2015   Prostate enlargement    Recurrent right inguinal hernia 05/18/2019   RLS (restless legs syndrome) 06/17/2017   Snoring 03/25/2017   Stage 3 chronic kidney disease ([place]) 04/22/2017   Stage 4 chronic kidney disease ([place]) 09/08/2020   Stress and adjustment reaction 01/12/2020   Tremor of both hands-  both wiht rest and activity; acutely worse 11/27/2018   Tremors of nervous system    Type 2 diabetes mellitus with stage 3 chronic kidney disease, without long-term current use of insulin ([place]) 05/05/2017   Urinary retention due to benign prostatic hyperplasia 01/25/2015  Venous insufficiency of both lower extremities 11/27/2018   White coat syndrome with diagnosis of hypertension 03/10/2020    Patient Active Problem List   Diagnosis Date Noted   Squamous cell carcinoma of antihelix, right 11/02/2020   Prostate enlargement    History of myocardial infarction    Elbow effusion, left    Cataract    Stage 4 chronic kidney disease ([place]) 09/08/2020   White coat syndrome with diagnosis of hypertension 03/10/2020   Stress and adjustment reaction 01/12/2020    Chronic head/ "scalp" pain 01/12/2020   Anemia due to chemical agent (HCC)-dermatologic medications most likely 12/08/2019   Erosive pustular dermatosis of scalp 12/08/2019   Benign essential tremor 12/08/2019   Vitamin D deficiency 12/08/2019   Recurrent right inguinal hernia 05/18/2019   Atypical chest pain 05/07/2019   Venous insufficiency of both lower extremities 11/27/2018   Tremor of both hands-  both wiht rest and activity; acutely worse 11/27/2018   Orthostatic hypotension 11/05/2018   Bilateral lower extremity edema 11/17/2017   Hypertension associated with diabetes ([place]) 08/05/2017   Mixed diabetic hyperlipidemia associated with type 2 diabetes mellitus ([place]) 08/05/2017   Microalbuminuria due to type 2 diabetes mellitus ([place]) 08/05/2017   RLS (restless legs syndrome) 06/17/2017   GERD (gastroesophageal reflux disease) 05/05/2017   Glossodynia 05/05/2017   Costochondritis 05/05/2017   Type 2 diabetes mellitus with stage 3 chronic kidney disease, without long-term current use of insulin ([place]) 05/05/2017   CAD (coronary artery disease) 03/25/2017   Snoring 03/25/2017   Urinary retention due to benign prostatic hyperplasia 01/25/2015   Primary osteoarthritis of right shoulder 01/24/2015    Past Surgical History:  Procedure Laterality Date   CARDIAC CATHETERIZATION     1999   cardio stint     HERNIA REPAIR     BILAT    INGUINAL HERNIA REPAIR  05/18/2019   INGUINAL HERNIA REPAIR Right 05/18/2019   Procedure: REPAIR RIGHT INGUINAL HERNIA WITH MESH;  Surgeon: Erroll Luna, Barnett;  Location: [place];  Service: General;  Laterality: Right;   MOHS SURGERY     BILAT EARS    PROSTATE BIOPSY     TOTAL SHOULDER ARTHROPLASTY Right 01/24/2015   Procedure: RIGHT TOTAL SHOULDER ARTHROPLASTY;  Surgeon: Marchia Bond, Barnett;  Location: [place];  Service: Orthopedics;  Laterality: Right;       Home Medications    Prior to Admission medications   Medication Sig Start Date End Date Taking?  Authorizing Provider  cephALEXin (KEFLEX) 500 MG capsule Take 1 capsule (500 mg total) by mouth 3 (three) times daily for 5 days. 08/27/21 09/01/21 Yes Modupe Shampine, Myrene Galas, Barnett  aspirin 81 MG tablet Take 81 mg by mouth every evening.     Provider, Historical, Barnett  atorvastatin (LIPITOR) 10 MG tablet Take 1 tablet (10 mg total) by mouth every other day. 07/18/21   Haydee Salter, Barnett  [employer] PAIN RELIEF 4 % CREA Apply topically as needed. 06/01/21   Provider, Historical, Barnett  empagliflozin (JARDIANCE) 25 MG TABS tablet Take 1 tablet (25 mg total) by mouth daily before breakfast. 07/20/21   Haydee Salter, Barnett  folic acid (FOLVITE) 1 MG tablet Take 1 mg by mouth daily. except Mondays    Provider, Historical, Barnett  furosemide (LASIX) 20 MG tablet Take 1 tablet (20 mg total) by mouth daily. 04/26/21   Park Liter, Barnett  gabapentin (NEURONTIN) 100 MG capsule Take 1 capsule (100 mg total) by mouth 3 (three) times daily. 07/18/21  Haydee Salter, Barnett  gentian violet 2 % topical solution Use as directed 0.5 mLs in the mouth or throat 3 (three) times daily. 03/15/21   Provider, Historical, Barnett  glimepiride (AMARYL) 1 MG tablet TAKE 1 TABLET DAILY WITH   BREAKFAST 07/12/21   Haydee Salter, Barnett  losartan (COZAAR) 25 MG tablet Take 1 tablet (25 mg total) by mouth daily. 07/18/21   Haydee Salter, Barnett  metFORMIN (GLUCOPHAGE) 500 MG tablet Take 1 tablet (500 mg total) by mouth 2 (two) times daily with a meal. 07/18/21   Haydee Salter, Barnett  methotrexate (RHEUMATREX) 2.5 MG tablet Take 15 mg by mouth once a week. 07/09/21   Provider, Historical, Barnett  mupirocin ointment (BACTROBAN) 2 % APPLY TOPICALLY DAILY. TO OPEN SORES ON SCALP 03/08/21   Provider, Historical, Barnett  Polyethyl Glycol-Propyl Glycol (SYSTANE OP) Place 1 drop into both eyes daily. Unknown stregth    Provider, Historical, Barnett  polyethylene glycol (MIRALAX / GLYCOLAX) packet Take 17 g by mouth daily as needed for mild constipation.     Provider, Historical, Barnett   predniSONE (DELTASONE) 10 MG tablet Take 4 PO QAM x 5 days, 3 PO QAM x 5 days, 2 PO QAM x 5 days, 1 PO QAM x 5 days, 1/2 PO QAM x 5 days then STOP 07/31/21   Provider, Historical, Barnett  Probiotic Product (PROBIOTIC DAILY PO) Take 1 capsule by mouth daily with lunch. Unknown strength    Provider, Historical, Barnett  tacrolimus (PROTOPIC) 0.1 % ointment Apply 1 application topically 2 (two) times daily. 06/15/20   Provider, Historical, Barnett  triamcinolone cream (KENALOG) 0.1 % Apply 1 application topically 2 (two) times daily. 08/21/21   Haydee Salter, Barnett    Family History Family History  Problem Relation Age of Onset   Hypertension Brother     Social History Social History   Tobacco Use   Smoking status: Never   Smokeless tobacco: Never  Vaping Use   Vaping Use: Never used  Substance Use Topics   Alcohol use: No   Drug use: No     Allergies   Exenatide, Meperidine and related, Nadolol, and Sitagliptin phosphate [sitagliptin]   Review of Systems Review of Systems  Gastrointestinal: Negative.   Musculoskeletal: Negative.   Skin:  Positive for color change and wound.    Physical Exam Triage Vital Signs ED Triage Vitals  Enc Vitals Group     BP 08/27/21 1119 (!) 169/93     Pulse Rate 08/27/21 1119 60     Resp 08/27/21 1119 16     Temp 08/27/21 1119 98 F (36.7 C)     Temp Source 08/27/21 1119 Oral     SpO2 08/27/21 1119 100 %     Weight --      Height --      Head Circumference --      Peak Flow --      Pain Score 08/27/21 1124 3     Pain Loc --      Pain Edu? --      Excl. in [place]? --    No data found.  Updated Vital Signs BP (!) 169/93 (BP Location: Right Arm)   Pulse 60   Temp 98 F (36.7 C) (Oral)   Resp 16   SpO2 100%   Visual Acuity Right Eye Distance:   Left Eye Distance:   Bilateral Distance:    Right Eye Near:   Left Eye Near:  Bilateral Near:     Physical Exam Vitals and nursing note reviewed.  Cardiovascular:     Rate and Rhythm: Normal  rate and regular rhythm.  Musculoskeletal:        General: Normal range of motion.  Skin:    Comments: Left forearm wound with healthy eschar.  Wound measures about half an inch in the longest diameter.  There is surrounding erythema with few pustules.     UC Treatments / Results  Labs (all labs ordered are listed, but only abnormal results are displayed) Labs Reviewed - No data to display  EKG   Radiology No results found.  Procedures Procedures (including critical care time)  Medications Ordered in UC Medications - No data to display  Initial Impression / Assessment and Plan / UC Course  I have reviewed the triage vital signs and the nursing notes.  Pertinent labs & imaging results that were available during my care of the patient were reviewed by me and considered in my medical decision making (see chart for details).     1.  Postoperative wound cellulitis: Keflex 500 mg 3 times daily for 5 days Mupirocin cream to be applied to the area If symptoms worsen please go to the dermatologist office to be evaluated. Return precautions given. Final Clinical Impressions(s) / UC Diagnoses   Final diagnoses:  Postoperative wound cellulitis     Discharge Instructions      Please apply antibiotic ointments to the areas involved No need for occlusive dressing If you have worsening symptoms please call your dermatologist to be reevaluated.   ED Prescriptions     Medication Sig Dispense Auth. Provider   cephALEXin (KEFLEX) 500 MG capsule Take 1 capsule (500 mg total) by mouth 3 (three) times daily for 5 days. 15 capsule Jazmine Longshore, Myrene Galas, Barnett      PDMP not reviewed this encounter.   Chase Picket, Barnett 08/27/21 [phone]

## 2021-09-18 DIAGNOSIS — L988 Other specified disorders of the skin and subcutaneous tissue: Secondary | ICD-10-CM | POA: Diagnosis not present

## 2021-09-18 DIAGNOSIS — B358 Other dermatophytoses: Secondary | ICD-10-CM | POA: Diagnosis not present

## 2021-09-18 DIAGNOSIS — B359 Dermatophytosis, unspecified: Secondary | ICD-10-CM | POA: Diagnosis not present

## 2021-09-18 DIAGNOSIS — L131 Subcorneal pustular dermatitis: Secondary | ICD-10-CM | POA: Diagnosis not present

## 2021-09-18 DIAGNOSIS — C44612 Basal cell carcinoma of skin of right upper limb, including shoulder: Secondary | ICD-10-CM | POA: Diagnosis not present

## 2021-09-18 DIAGNOSIS — Z5181 Encounter for therapeutic drug level monitoring: Secondary | ICD-10-CM | POA: Diagnosis not present

## 2021-09-18 DIAGNOSIS — Z7952 Long term (current) use of systemic steroids: Secondary | ICD-10-CM | POA: Diagnosis not present

## 2021-10-16 ENCOUNTER — Other Ambulatory Visit: Payer: Self-pay

## 2021-10-17 ENCOUNTER — Ambulatory Visit (INDEPENDENT_AMBULATORY_CARE_PROVIDER_SITE_OTHER): Payer: Medicare Other | Admitting: Family Medicine

## 2021-10-17 VITALS — BP 130/74 | HR 70 | Temp 97.5°F | Ht 72.0 in | Wt 154.8 lb

## 2021-10-17 DIAGNOSIS — L988 Other specified disorders of the skin and subcutaneous tissue: Secondary | ICD-10-CM

## 2021-10-17 DIAGNOSIS — I152 Hypertension secondary to endocrine disorders: Secondary | ICD-10-CM

## 2021-10-17 DIAGNOSIS — N1831 Chronic kidney disease, stage 3a: Secondary | ICD-10-CM | POA: Diagnosis not present

## 2021-10-17 DIAGNOSIS — E1169 Type 2 diabetes mellitus with other specified complication: Secondary | ICD-10-CM | POA: Diagnosis not present

## 2021-10-17 DIAGNOSIS — E1159 Type 2 diabetes mellitus with other circulatory complications: Secondary | ICD-10-CM | POA: Diagnosis not present

## 2021-10-17 DIAGNOSIS — E1122 Type 2 diabetes mellitus with diabetic chronic kidney disease: Secondary | ICD-10-CM

## 2021-10-17 DIAGNOSIS — E782 Mixed hyperlipidemia: Secondary | ICD-10-CM | POA: Diagnosis not present

## 2021-10-17 NOTE — Progress Notes (Signed)
[facility] [address] [place] [address] Dept: [phone] Dept Fax: [phone]  Chronic Care Office Visit  Subjective:    Patient ID: George Barnett, male    DOB: [birth year], 87 y.o..   MRN: [id number]  Chief Complaint  Patient presents with   Follow-up    3 month f/u.  No concerns. Average BS 120-140 and BP 101/65 - 132-75    History of Present Illness:  Patient is in today for reassessment of chronic medical issues.  George Barnett continues to see dermatology related to his erosive pustular dermatosis. He remains on MTX and tacrolimus. He is now on a compounded topical product containing tofacitinib and niacinamide. He notes he may have had some mild improvements with this. He is also being seen by the dermatologist for several SCC lesions. He has had slow healing for many of these after treatments.   George Barnett has a history of Type 2 DM, hypertension, and hyperlipidemia. He is monitoring his blood sugars at home. He takes metformin, glimepiride, and Jardiance. Recently, his FSBS have been 120-140s fasting.  He is on losartan for blood pressure control and atorvastatin for his lipids.  Past Medical History: Patient Active Problem List   Diagnosis Date Noted   Squamous cell carcinoma of antihelix, right 11/02/2020   Prostate enlargement    History of myocardial infarction    Elbow effusion, left    Cataract    Stage 4 chronic kidney disease ([place]) 09/08/2020   White coat syndrome with diagnosis of hypertension 03/10/2020   Stress and adjustment reaction 01/12/2020   Chronic head/ "scalp" pain 01/12/2020   Anemia due to chemical agent (HCC)-dermatologic medications most likely 12/08/2019   Erosive pustular dermatosis of scalp 12/08/2019   Benign essential tremor 12/08/2019   Vitamin D deficiency 12/08/2019   Recurrent right inguinal hernia 05/18/2019   Atypical chest pain 05/07/2019   Venous insufficiency of  both lower extremities 11/27/2018   Tremor of both hands-  both wiht rest and activity; acutely worse 11/27/2018   Orthostatic hypotension 11/05/2018   Bilateral lower extremity edema 11/17/2017   Hypertension associated with diabetes ([place]) 08/05/2017   Mixed diabetic hyperlipidemia associated with type 2 diabetes mellitus ([place]) 08/05/2017   Microalbuminuria due to type 2 diabetes mellitus ([place]) 08/05/2017   RLS (restless legs syndrome) 06/17/2017   GERD (gastroesophageal reflux disease) 05/05/2017   Glossodynia 05/05/2017   Costochondritis 05/05/2017   Type 2 diabetes mellitus with stage 3 chronic kidney disease, without long-term current use of insulin ([place]) 05/05/2017   CAD (coronary artery disease) 03/25/2017   Snoring 03/25/2017   Urinary retention due to benign prostatic hyperplasia 01/25/2015   Primary osteoarthritis of right shoulder 01/24/2015   Past Surgical History:  Procedure Laterality Date   CARDIAC CATHETERIZATION     1999   cardio stint     HERNIA REPAIR     BILAT    INGUINAL HERNIA REPAIR  05/18/2019   INGUINAL HERNIA REPAIR Right 05/18/2019   Procedure: REPAIR RIGHT INGUINAL HERNIA WITH MESH;  Surgeon: Erroll Luna, MD;  Location: [facility];  Service: General;  Laterality: Right;   MOHS SURGERY     BILAT EARS    PROSTATE BIOPSY     TOTAL SHOULDER ARTHROPLASTY Right 01/24/2015   Procedure: RIGHT TOTAL SHOULDER ARTHROPLASTY;  Surgeon: Marchia Bond, MD;  Location: [facility];  Service: Orthopedics;  Laterality: Right;   Family History  Problem Relation Age of Onset   Hypertension Brother  Outpatient Medications Prior to Visit  Medication Sig Dispense Refill   aspirin 81 MG tablet Take 81 mg by mouth every evening.      atorvastatin (LIPITOR) 10 MG tablet Take 1 tablet (10 mg total) by mouth every other day. 45 tablet 3   [employer] PAIN RELIEF 4 % CREA Apply topically as needed.     empagliflozin (JARDIANCE) 25 MG TABS tablet Take 1 tablet (25 mg total) by mouth daily  before breakfast. 30 tablet 3   folic acid (FOLVITE) 1 MG tablet Take 1 mg by mouth daily. except Mondays     furosemide (LASIX) 20 MG tablet Take 1 tablet (20 mg total) by mouth daily. 90 tablet 1   gabapentin (NEURONTIN) 100 MG capsule Take 1 capsule (100 mg total) by mouth 3 (three) times daily. 270 capsule 3   glimepiride (AMARYL) 1 MG tablet TAKE 1 TABLET DAILY WITH   BREAKFAST 90 tablet 1   losartan (COZAAR) 25 MG tablet Take 1 tablet (25 mg total) by mouth daily. 90 tablet 3   metFORMIN (GLUCOPHAGE) 500 MG tablet Take 1 tablet (500 mg total) by mouth 2 (two) times daily with a meal. 180 tablet 3   methotrexate (RHEUMATREX) 2.5 MG tablet Take 15 mg by mouth once a week.     NONFORMULARY OR COMPOUNDED ITEM Apply topically in the morning and at bedtime. Tofacitinib/niacinamide     Polyethyl Glycol-Propyl Glycol (SYSTANE OP) Place 1 drop into both eyes daily. Unknown stregth     polyethylene glycol (MIRALAX / GLYCOLAX) packet Take 17 g by mouth daily as needed for mild constipation.      predniSONE (DELTASONE) 10 MG tablet Take 4 PO QAM x 5 days, 3 PO QAM x 5 days, 2 PO QAM x 5 days, 1 PO QAM x 5 days, 1/2 PO QAM x 5 days then STOP     Probiotic Product (PROBIOTIC DAILY PO) Take 1 capsule by mouth daily with lunch. Unknown strength     tacrolimus (PROTOPIC) 0.1 % ointment Apply 1 application topically 2 (two) times daily.     triamcinolone cream (KENALOG) 0.1 % Apply 1 application topically 2 (two) times daily. 30 g 0   mupirocin ointment (BACTROBAN) 2 % APPLY TOPICALLY DAILY. TO OPEN SORES ON SCALP     gentian violet 2 % topical solution Use as directed 0.5 mLs in the mouth or throat 3 (three) times daily. (Patient not taking: Reported on 10/17/2021)     No facility-administered medications prior to visit.   Allergies  Allergen Reactions   Exenatide Nausea And Vomiting   Meperidine And Related Other (See Comments)    Red streak, that happened once while having an IV   Nadolol Other (See  Comments)    Numbness in fingers   Sitagliptin Phosphate [Sitagliptin] Other (See Comments)    Weight loss    Objective:   Today's Vitals   10/17/21 1522  BP: 130/74  Pulse: 70  Temp: (!) 97.5 F (36.4 C)  TempSrc: Temporal  SpO2: 98%  Weight: 154 lb 12.8 oz (70.2 kg)  Height: 6' (1.829 m)   Body mass index is 20.99 kg/m.   General: Well developed, well nourished. No acute distress. Scalp: Pustular lesions and chronic skin changes to the scalp. The area looks less   involved than earlier this year. Psych: Alert and oriented. Normal mood and affect.  Health Maintenance Due  Topic Date Due   Zoster Vaccines- Shingrix (1 of 2) Never done   TETANUS/TDAP  04/25/2020   COVID-19 Vaccine (5 - Booster for Pfizer series) 04/26/2021   Lab Results Last hemoglobin A1c Lab Results  Component Value Date   HGBA1C 7.8 (H) 07/18/2021   BMP Latest Ref Rng & Units 07/18/2021 04/13/2021 01/08/2021  Glucose 70 - 99 mg/dL 193(H) 145(H) 164(H)  BUN 6 - 23 mg/dL - - 33(H)  Creatinine 0.40 - 1.50 mg/dL - - 1.26  BUN/Creat Ratio 10 - 24 - - -  Sodium 135 - 145 mEq/L - - 140  Potassium 3.5 - 5.1 mEq/L - - 5.1  Chloride 96 - 112 mEq/L - - 104  CO2 19 - 32 mEq/L - - 28  Calcium 8.4 - 10.5 mg/dL - - 9.5     Assessment & Plan:   1. Hypertension associated with diabetes ([place]) Blood pressure is at goal. We will continue losartan.  2. Type 2 diabetes mellitus with stage 3a chronic kidney disease, without long-term current use of insulin (HCC) Due to reassess A1c. Continue metformin, glimepiride, and empagliflozin.  - Glucose, random - Hemoglobin A1c  3. Mixed diabetic hyperlipidemia associated with type 2 diabetes mellitus (HCC) Last lipids were in excellent control. [place] plan to recheck lipids at next visit in 3 months.  4. Erosive pustular dermatosis of scalp I see improvements in the scalp with the current treatments. Mr. Wehmeyer struggles with both the appearance of this and with the  pain he feels. He notes some hope that the current treatments will continue to lead to an improvement.  Haydee Salter, MD

## 2021-10-18 LAB — HEMOGLOBIN A1C: Hgb A1c MFr Bld: 8.9 % — ABNORMAL HIGH (ref 4.6–6.5)

## 2021-10-18 LAB — GLUCOSE, RANDOM: Glucose, Bld: 189 mg/dL — ABNORMAL HIGH (ref 70–99)

## 2021-10-19 ENCOUNTER — Encounter: Payer: Self-pay | Admitting: Family Medicine

## 2021-10-25 ENCOUNTER — Other Ambulatory Visit: Payer: Self-pay | Admitting: Family Medicine

## 2021-10-25 DIAGNOSIS — E119 Type 2 diabetes mellitus without complications: Secondary | ICD-10-CM

## 2021-10-26 DIAGNOSIS — C44612 Basal cell carcinoma of skin of right upper limb, including shoulder: Secondary | ICD-10-CM | POA: Diagnosis not present

## 2021-10-29 ENCOUNTER — Encounter: Payer: Self-pay | Admitting: Family Medicine

## 2021-10-29 DIAGNOSIS — C4491 Basal cell carcinoma of skin, unspecified: Secondary | ICD-10-CM | POA: Insufficient documentation

## 2021-11-05 ENCOUNTER — Ambulatory Visit (INDEPENDENT_AMBULATORY_CARE_PROVIDER_SITE_OTHER): Payer: Medicare Other | Admitting: Cardiology

## 2021-11-05 ENCOUNTER — Encounter: Payer: Self-pay | Admitting: Cardiology

## 2021-11-05 ENCOUNTER — Other Ambulatory Visit: Payer: Self-pay

## 2021-11-05 VITALS — BP 114/60 | HR 78 | Ht 72.0 in | Wt 155.0 lb

## 2021-11-05 DIAGNOSIS — I2510 Atherosclerotic heart disease of native coronary artery without angina pectoris: Secondary | ICD-10-CM | POA: Diagnosis not present

## 2021-11-05 DIAGNOSIS — R0789 Other chest pain: Secondary | ICD-10-CM | POA: Diagnosis not present

## 2021-11-05 DIAGNOSIS — E1159 Type 2 diabetes mellitus with other circulatory complications: Secondary | ICD-10-CM

## 2021-11-05 DIAGNOSIS — I152 Hypertension secondary to endocrine disorders: Secondary | ICD-10-CM | POA: Diagnosis not present

## 2021-11-05 DIAGNOSIS — I951 Orthostatic hypotension: Secondary | ICD-10-CM

## 2021-11-05 NOTE — Progress Notes (Signed)
Chief Complaint  Patient presents with   Follow-up    Rm 2, w wife. Here for 6 month tremor f/u. Pt reports taking gabapentin only at night. His main concern today is his hand tremors, which he reports has worsened. Unable to write, has to use both hands to drink.     HISTORY OF PRESENT ILLNESS:  11/06/21 ALL:  George Barnett is a 87 y.o. male here today for follow up for essential tremor. He was last seen by Dr Rexene Alberts 04/2021 and advised to continue gabapentin 100mg  TID per dermatology. He did feel that TID dosing was helping and has recently decreased to 100mg  QHS. Tremor continues to be bothersome. He reports that it effects his ability to write and he has difficulty eating due to tremor. He is followed closely by PCP. Last creatinine normal 12/2020. He has follow up for CPE soon.    HISTORY (copied from Dr Guadelupe Sabin previous note)  Mr. George Barnett is an 87 year old right-handed gentleman with an underlying medical history of diabetes, hypertension, chronic kidney disease, hyperlipidemia, venous insufficiency, and history of orthostatic hypotension, who presents for Follow-up consultation of his essential tremor of several years duration.  The patient is accompanied by his wife again today.  I last saw him on 01/30/2021, at which time he reported that his tremor has become worse.  He did have a resting component, he was worried about Parkinson's disease but he did not have any telltale signs of parkinsonism.  We talked about treatment options for essential tremor.  I suggested we avoid a beta-blocker.  He was advised that we could consider low-dose Mysoline but I advised him to use gabapentin and low-dose for now, 100 mg at bedtime.  We talked about side effects of beta-blockers, especially in light of his orthostatic lightheadedness, and the risk for hypotension as well as his chronic kidney disease and the risk for side effects with Mysoline at length at the time.   Today, 05/03/21: He reports  feeling about the same with the regard to the tremor, perhaps slightly better.  He has significant pain on his scalp, he has to go to the dermatologist on 6 weekly basis get treatments for his pustular dermatitis.  Treatment at home and cleaning the debris at home on a day-to-day basis is extremely painful for him.  Dermatology had increased his gabapentin to 100 mg 3 times daily.  He is tolerating this.  In the past, perhaps a year or 2 ago when he tried gabapentin through dermatology he woke up with significant hearing loss and was too scared to continue with the gabapentin.  He is tolerating it at this time, no side effects reported.  He feels a little bit discouraged as to the painful scalp treatments he is going through but has been told by his dermatologist that things are looking better.  His wife also feels that there are patches that are looking much better than they used to.  Let us check on his kidney function from 01/08/2021 showed BUN of 33 and creatinine of 1.26.  He tries to hydrate well with water.  He drinks 1 cup of coffee in the morning and 1 soda during the day.  He has not had any recent falls.   REVIEW OF SYSTEMS: Out of a complete 14 system review of symptoms, the patient complains only of the following symptoms, skin irritation and pain, tremor, and all other reviewed systems are negative.   ALLERGIES: Allergies  Allergen Reactions   Exenatide  Nausea And Vomiting   Meperidine And Related Other (See Comments)    Red streak, that happened once while having an IV   Nadolol Other (See Comments)    Numbness in fingers   Sitagliptin Phosphate [Sitagliptin] Other (See Comments)    Weight loss      HOME MEDICATIONS: Outpatient Medications Prior to Visit  Medication Sig Dispense Refill   aspirin 81 MG tablet Take 81 mg by mouth every evening.      atorvastatin (LIPITOR) 10 MG tablet Take 1 tablet (10 mg total) by mouth every other day. 45 tablet 3   [employer] PAIN RELIEF 4 % CREA  Apply topically as needed.     folic acid (FOLVITE) 1 MG tablet Take 1 mg by mouth daily. except Mondays     furosemide (LASIX) 20 MG tablet Take 1 tablet (20 mg total) by mouth daily. 90 tablet 1   gabapentin (NEURONTIN) 100 MG capsule Take 100 mg by mouth at bedtime.     glimepiride (AMARYL) 1 MG tablet TAKE 1 TABLET DAILY WITH   BREAKFAST 90 tablet 1   JARDIANCE 25 MG TABS tablet TAKE 1 TABLET DAILY BEFORE BREAKFAST 90 tablet 1   losartan (COZAAR) 25 MG tablet Take 1 tablet (25 mg total) by mouth daily. 90 tablet 3   metFORMIN (GLUCOPHAGE) 500 MG tablet Take 1,000 mg by mouth 2 (two) times daily with a meal. Take 1000mg  in am and 500mg  in pm     metFORMIN (GLUCOPHAGE) 500 MG tablet Take by mouth as directed. Take 1000mg  in am and 500mg  in pm     methotrexate (RHEUMATREX) 2.5 MG tablet Take 15 mg by mouth once a week.     mupirocin ointment (BACTROBAN) 2 % On ARM     NONFORMULARY OR COMPOUNDED ITEM Apply topically in the morning and at bedtime. Tofacitinib/niacinamide     Polyethyl Glycol-Propyl Glycol (SYSTANE OP) Place 1 drop into both eyes daily. Unknown stregth     polyethylene glycol (MIRALAX / GLYCOLAX) packet Take 17 g by mouth daily as needed for mild constipation.      predniSONE (DELTASONE) 10 MG tablet Take 4 PO QAM x 5 days, 3 PO QAM x 5 days, 2 PO QAM x 5 days, 1 PO QAM x 5 days, 1/2 PO QAM x 5 days then STOP     Probiotic Product (PROBIOTIC DAILY PO) Take 1 capsule by mouth daily with lunch. Unknown strength     tacrolimus (PROTOPIC) 0.1 % ointment Apply 1 application topically 2 (two) times daily.     triamcinolone cream (KENALOG) 0.1 % Apply 1 application topically 2 (two) times daily. 30 g 0   No facility-administered medications prior to visit.     PAST MEDICAL HISTORY: Past Medical History:  Diagnosis Date   Abdominal pain 03/25/2017   Anemia due to chemical agent (HCC)-dermatologic medications most likely 12/08/2019   Atypical chest pain 05/07/2019   Benign essential  tremor 12/08/2019   Bilateral lower extremity edema 11/17/2017   Brittle diabetes mellitus ([place]) 01/12/2020   CAD (coronary artery disease) 03/25/2017   Cancer [facility])    SKIN CANCER    Cataract    Chronic head/ "scalp" pain 01/12/2020   Per patient the pain of his scalp is like "bees stinging you all over on your head "he says the slightest touch to the area causes significant pain/discomfort   Costochondritis 05/05/2017   --->pt had mentioned costochondritis at his last office visit with his former PCP prior to transferring  care to [place]:  [name], MD - 02/24/2017 10:30 AM EDT Formatting of this note may be different from the original. Donathan is a 87 y.o. male who is here for a follow up visit.  DM: He is taking metformin and glimepiride and tolerates these well. His A1C was 7.1 on recent labs.  HTN: He   Decreased activity 12/08/2019   Dehydration- mild 12/08/2019   Diabetes mellitus without complication (HCC)    Edema, peripheral 11/22/2014   Elbow effusion, left    Erosive pustular dermatosis of scalp 2/42/3536   Folliculitis- scalp; txed by Derm- Dr Jarome Matin 07/27/2018   Frequency of urination    GAD (generalized anxiety disorder) 12/10/2019   GERD (gastroesophageal reflux disease)    Glossodynia 05/05/2017   [name], MD - 11/22/2016 2:15 PM EST Formatting of this note may be different from the original. Otolaryngology Initial Consultation Note  Requesting Attending Physician: [name], MD Service Requesting Consult: No service for patient encounter.  History of Present Illness:   The patient is being seen in consultation with [name], MD for the evaluation of tongu   Heart attack [facility])    Heart disease    High risk medication use 01/15/2019   Hypertension    Hypertension associated with diabetes ([place]) 08/05/2017   Microalbuminuria due to type 2 diabetes mellitus ([place]) 08/05/2017   Mixed diabetic hyperlipidemia associated with type 2 diabetes  mellitus ([place]) 08/05/2017   Orthostatic hypotension 11/05/2018   Primary osteoarthritis of right shoulder 01/24/2015   Prostate enlargement    Recurrent right inguinal hernia 05/18/2019   RLS (restless legs syndrome) 06/17/2017   Snoring 03/25/2017   Stage 3 chronic kidney disease ([place]) 04/22/2017   Stage 4 chronic kidney disease ([place]) 09/08/2020   Stress and adjustment reaction 01/12/2020   Tremor of both hands-  both wiht rest and activity; acutely worse 11/27/2018   Tremors of nervous system    Type 2 diabetes mellitus with stage 3 chronic kidney disease, without long-term current use of insulin ([place]) 05/05/2017   Urinary retention due to benign prostatic hyperplasia 01/25/2015   Venous insufficiency of both lower extremities 11/27/2018   White coat syndrome with diagnosis of hypertension 03/10/2020     PAST SURGICAL HISTORY: Past Surgical History:  Procedure Laterality Date   CARDIAC CATHETERIZATION     1999   cardio stint     HERNIA REPAIR     BILAT    INGUINAL HERNIA REPAIR  05/18/2019   INGUINAL HERNIA REPAIR Right 05/18/2019   Procedure: REPAIR RIGHT INGUINAL HERNIA WITH MESH;  Surgeon: Erroll Luna, MD;  Location: [place] [facility];  Service: General;  Laterality: Right;   MOHS SURGERY     BILAT EARS    PROSTATE BIOPSY     TOTAL SHOULDER ARTHROPLASTY Right 01/24/2015   Procedure: RIGHT TOTAL SHOULDER ARTHROPLASTY;  Surgeon: Marchia Bond, MD;  Location: [place];  Service: Orthopedics;  Laterality: Right;     FAMILY HISTORY: Family History  Problem Relation Age of Onset   Hypertension Brother      SOCIAL HISTORY: Social History   Socioeconomic History   Marital status: Married    Spouse name: Not on file   Number of children: Not on file   Years of education: Not on file   Highest education level: Not on file  Occupational History   Not on file  Tobacco Use   Smoking status: Never   Smokeless tobacco: Never  Vaping Use   Vaping  Use: Never used  Substance and Sexual Activity    Alcohol use: No   Drug use: No   Sexual activity: Not on file  Other Topics Concern   Not on file  Social History Narrative   Not on file   Social Determinants of Health   Financial Resource Strain: Low Risk    Difficulty of Paying Living Expenses: Not hard at all  Food Insecurity: No Food Insecurity   Worried About Charity fundraiser in the Last Year: Never true   [place] in the Last Year: Never true  Transportation Needs: No Transportation Needs   Lack of Transportation (Medical): No   Lack of Transportation (Non-Medical): No  Physical Activity: Inactive   Days of Exercise per Week: 0 days   Minutes of Exercise per Session: 0 min  Stress: No Stress Concern Present   Feeling of Stress : Only a little  Social Connections: Moderately Integrated   Frequency of Communication with Friends and Family: More than three times a week   Frequency of Social Gatherings with Friends and Family: Three times a week   Attends Religious Services: More than 4 times per year   Active Member of Clubs or Organizations: No   Attends Archivist Meetings: Never   Marital Status: Married  Human resources officer Violence: Not At Risk   Fear of Current or Ex-Partner: No   Emotionally Abused: No   Physically Abused: No   Sexually Abused: No     PHYSICAL EXAM  Vitals:   11/06/21 1324  BP: (!) 153/77  Pulse: 77  Weight: 156 lb (70.8 kg)  Height: 6' (1.829 m)   Body mass index is 21.16 kg/m.  Generalized: Well developed, in no acute distress  Cardiology: normal rate and rhythm, no murmur auscultated  Respiratory: clear to auscultation bilaterally    Neurological examination  Mentation: Alert oriented to time, place, history taking. Follows all commands speech and language fluent Cranial nerve II-XII: Pupils were equal round reactive to light. Extraocular movements were full, visual field were full on confrontational test. Facial sensation and strength were normal. Uvula tongue  midline. Head turning and shoulder shrug  were normal and symmetric. Motor: The motor testing reveals 5 over 5 strength of all 4 extremities. Good symmetric motor tone is noted throughout. Mild to moderate bilateral action and postural tremor, intermittent mild bilateral resting tremor.  Sensory: Sensory testing is intact to soft touch on all 4 extremities. No evidence of extinction is noted.  Coordination: Cerebellar testing reveals good finger-nose-finger and heel-to-shin bilaterally.  Gait and station: Gait is stable without assistive device, slight stooped posture, no shuffling noted.    DIAGNOSTIC DATA (LABS, IMAGING, TESTING) - I reviewed patient records, labs, notes, testing and imaging myself where available.  Lab Results  Component Value Date   WBC 8.0 03/07/2020   HGB 12.8 (L) 03/07/2020   HCT 38.3 03/07/2020   MCV 101 (H) 03/07/2020   PLT 176 03/07/2020      Component Value Date/Time   NA 140 01/08/2021 1534   NA 142 09/06/2020 1140   K 5.1 01/08/2021 1534   CL 104 01/08/2021 1534   CO2 28 01/08/2021 1534   GLUCOSE 189 (H) 10/17/2021 1552   BUN 33 (H) 01/08/2021 1534   BUN 28 (H) 09/06/2020 1140   CREATININE 1.26 01/08/2021 1534   CREATININE 1.23 03/07/2014 1454   CALCIUM 9.5 01/08/2021 1534   PROT 6.0 09/06/2020 1140   ALBUMIN 3.9 09/06/2020 1140  AST 21 09/06/2020 1140   ALT 15 09/06/2020 1140   ALKPHOS 75 09/06/2020 1140   BILITOT 0.5 09/06/2020 1140   GFRNONAA 50 (L) 09/06/2020 1140   GFRAA 58 (L) 09/06/2020 1140   Lab Results  Component Value Date   CHOL 94 01/08/2021   HDL 52.20 01/08/2021   LDLCALC 30 01/08/2021   TRIG 57.0 01/08/2021   CHOLHDL 2 01/08/2021   Lab Results  Component Value Date   HGBA1C 8.9 (H) 10/17/2021   Lab Results  Component Value Date   VITAMINB12 1,289 (H) 03/07/2020   Lab Results  Component Value Date   TSH 2.290 12/03/2019    No flowsheet data found.   No flowsheet data found.   ASSESSMENT AND PLAN  87  y.o. year old male  has a past medical history of Abdominal pain (03/25/2017), Anemia due to chemical agent (HCC)-dermatologic medications most likely (12/08/2019), Atypical chest pain (05/07/2019), Benign essential tremor (12/08/2019), Bilateral lower extremity edema (11/17/2017), Brittle diabetes mellitus ([place]) (01/12/2020), CAD (coronary artery disease) (03/25/2017), Cancer ([place]), Cataract, Chronic head/ "scalp" pain (01/12/2020), Costochondritis (05/05/2017), Decreased activity (12/08/2019), Dehydration- mild (12/08/2019), Diabetes mellitus without complication ([place]), Edema, peripheral (11/22/2014), Elbow effusion, left, Erosive pustular dermatosis of scalp (01/07/2541), Folliculitis- scalp; txed by Derm- Dr Jarome Matin (07/27/2018), Frequency of urination, GAD (generalized anxiety disorder) (12/10/2019), GERD (gastroesophageal reflux disease), Glossodynia (05/05/2017), Heart attack ([place]), Heart disease, High risk medication use (01/15/2019), Hypertension, Hypertension associated with diabetes ([place]) (08/05/2017), Microalbuminuria due to type 2 diabetes mellitus ([place]) (08/05/2017), Mixed diabetic hyperlipidemia associated with type 2 diabetes mellitus ([place]) (08/05/2017), Orthostatic hypotension (11/05/2018), Primary osteoarthritis of right shoulder (01/24/2015), Prostate enlargement, Recurrent right inguinal hernia (05/18/2019), RLS (restless legs syndrome) (06/17/2017), Snoring (03/25/2017), Stage 3 chronic kidney disease ([place]) (04/22/2017), Stage 4 chronic kidney disease ([place]) (09/08/2020), Stress and adjustment reaction (01/12/2020), Tremor of both hands-  both wiht rest and activity; acutely worse (11/27/2018), Tremors of nervous system, Type 2 diabetes mellitus with stage 3 chronic kidney disease, without long-term current use of insulin ([place]) (05/05/2017), Urinary retention due to benign prostatic hyperplasia (01/25/2015), Venous insufficiency of both lower extremities (11/27/2018), and White coat syndrome with diagnosis of hypertension  (03/10/2020). here with    Essential tremor  Mr Agrusa has not noted any significant improvement in tremor with gabapentin 100mg  TID. He is now taking 100mg  at bedtime and feels it only helps him rest better. He may continue per PCP/dermatology direction. We will start a low dose of primidone. He will take 1/2 tablet (25mg ) daily. If well tolerated after 1-2 weeks, he was advised to increase to full tablet (50mg ) daily. Side effects reviewed and educational material provided. He will continue close follow up with PCP. We will avoid multiple daily dosing at this time due to history of elevated creatinine. We will monitor closely if dose adjustments are needed. Healthy lifestyle habits encouraged. He will follow up with me in 4-6 months. He verbalizes understanding and agreement with this plan.    No orders of the defined types were placed in this encounter.    Meds ordered this encounter  Medications   primidone (MYSOLINE) 50 MG tablet    Sig: Take 1 tablet (50 mg total) by mouth daily.    Dispense:  90 tablet    Refill:  1    Order Specific Question:   Supervising Provider    Answer:   Melvenia Beam [[id number]]      Debbora Presto, MSN, FNP-C 11/06/2021, 2:16 PM  [facility] [phone]  [address] [place], [place] [address] [phone]

## 2021-11-05 NOTE — Patient Instructions (Signed)
Medication Instructions:  Your physician recommends that you continue on your current medications as directed. Please refer to the Current Medication list given to you today.  *If you need a refill on your cardiac medications before your next appointment, please call your pharmacy*   Lab Work: NONE If you have labs (blood work) drawn today and your tests are completely normal, you will receive your results only by: [place] (if you have MyChart) OR A paper copy in the mail If you have any lab test that is abnormal or we need to change your treatment, we will call you to review the results.   Testing/Procedures: NONE   Follow-Up: At [facility], you and your health needs are our priority.  As part of our continuing mission to provide you with exceptional heart care, we have created designated Provider Care Teams.  These Care Teams include your primary Cardiologist (physician) and Advanced Practice Providers (APPs -  Physician Assistants and Nurse Practitioners) who all work together to provide you with the care you need, when you need it.  We recommend signing up for the patient portal called "MyChart".  Sign up information is provided on this After Visit Summary.  MyChart is used to connect with patients for Virtual Visits (Telemedicine).  Patients are able to view lab/test results, encounter notes, upcoming appointments, etc.  Non-urgent messages can be sent to your provider as well.   To learn more about what you can do with MyChart, go to [email or web address].    Your next appointment:   1 year(s)  The format for your next appointment:   In Person  Provider:   Jenne Campus, MD    Other Instructions

## 2021-11-05 NOTE — Progress Notes (Signed)
Cardiology Office [place]:    Date:  11/05/2021   ID:  George Barnett, DOB 1933/05/06, MRN [id number]  PCP:  George Salter, [place]  Cardiologist:  George Campus, [place]    Referring [place]: George Salter, [place]   No chief complaint on file. Doing well cardiac wise  History of Present Illness:    George Barnett is a 87 y.o. male   with past medical history significant for coronary artery disease apparently in 1999 he suffered from small myocardial infarction that required stenting.  I do not have details regarding that event.  Last stress test done in 2020 showed no evidence of ischemia, left ventricle ejection fraction at that time was normal.  His EKG is normal.  He also got diabetes mellitus, dyslipidemia, essential hypertension.  Initially he was sent to [place] because of orthostatic hypotension but then seems to be controlled.  He does have some chronic disease of his scalp and usually conversation with him revolve around this problem which bothers him a lot.  Past Medical History:  Diagnosis Date   Abdominal pain 03/25/2017   Anemia due to chemical agent (HCC)-dermatologic medications most likely 12/08/2019   Atypical chest pain 05/07/2019   Benign essential tremor 12/08/2019   Bilateral lower extremity edema 11/17/2017   Brittle diabetes mellitus ([place]) 01/12/2020   CAD (coronary artery disease) 03/25/2017   Cancer [facility])    SKIN CANCER    Cataract    Chronic head/ "scalp" pain 01/12/2020   Per patient the pain of his scalp is like "bees stinging you all over on your head "he says the slightest touch to the area causes significant pain/discomfort   Costochondritis 05/05/2017   --->pt had mentioned costochondritis at his last office visit with his former PCP prior to transferring care to [place]:  George Lister, [place] - 02/24/2017 10:30 AM EDT Formatting of this [place] may be different from the original. George Barnett is a 87 y.o. male who is here for a follow up visit.  DM: He is taking metformin and  glimepiride and tolerates these well. His A1C was 7.1 on recent labs.  HTN: He   Decreased activity 12/08/2019   Dehydration- mild 12/08/2019   Diabetes mellitus without complication (HCC)    Edema, peripheral 11/22/2014   Elbow effusion, left    Erosive pustular dermatosis of scalp 1/94/1740   Folliculitis- scalp; txed by Derm- Dr George Barnett 07/27/2018   Frequency of urination    GAD (generalized anxiety disorder) 12/10/2019   GERD (gastroesophageal reflux disease)    Glossodynia 05/05/2017   George Barnett - 11/22/2016 2:15 PM EST Formatting of this [place] may be different from the original. George [place]ett  Requesting Attending Physician: George Lux, [place] Service Requesting Consult: No service for patient encounter.  History of Present Illness:   The patient is being seen in consultation with George Lux, [place] for the evaluation of tongu   Heart attack [facility])    Heart disease    High risk medication use 01/15/2019   Hypertension    Hypertension associated with diabetes ([place]) 08/05/2017   Microalbuminuria due to type 2 diabetes mellitus ([place]) 08/05/2017   Mixed diabetic hyperlipidemia associated with type 2 diabetes mellitus ([place]) 08/05/2017   Orthostatic hypotension 11/05/2018   Primary osteoarthritis of right shoulder 01/24/2015   Prostate enlargement    Recurrent right inguinal hernia 05/18/2019   RLS (restless legs syndrome) 06/17/2017   Snoring 03/25/2017   Stage 3 chronic kidney disease ([place]) 04/22/2017  Stage 4 chronic kidney disease ([place]) 09/08/2020   Stress and adjustment reaction 01/12/2020   Tremor of both hands-  both wiht rest and activity; acutely worse 11/27/2018   Tremors of nervous system    Type 2 diabetes mellitus with stage 3 chronic kidney disease, without long-term current use of insulin ([place]) 05/05/2017   Urinary retention due to benign prostatic hyperplasia 01/25/2015   Venous insufficiency of both lower extremities 11/27/2018   White  coat syndrome with diagnosis of hypertension 03/10/2020    Past Surgical History:  Procedure Laterality Date   CARDIAC CATHETERIZATION     1999   cardio stint     HERNIA REPAIR     BILAT    INGUINAL HERNIA REPAIR  05/18/2019   INGUINAL HERNIA REPAIR Right 05/18/2019   Procedure: REPAIR RIGHT INGUINAL HERNIA WITH MESH;  Surgeon: George Luna, [place];  Location: [place];  Service: General;  Laterality: Right;   MOHS SURGERY     BILAT EARS    PROSTATE BIOPSY     TOTAL SHOULDER ARTHROPLASTY Right 01/24/2015   Procedure: RIGHT TOTAL SHOULDER ARTHROPLASTY;  Surgeon: George Bond, [place];  Location: [place];  Service: Orthopedics;  Laterality: Right;    Current Medications: No outpatient medications have been marked as taking for the 11/05/21 encounter (Appointment) with George Liter, [place].     Allergies:   Exenatide, Meperidine and related, Nadolol, and Sitagliptin phosphate [sitagliptin]   Social History   Socioeconomic History   Marital status: Married    Spouse name: Not on file   Number of children: Not on file   Years of education: Not on file   Highest education level: Not on file  Occupational History   Not on file  Tobacco Use   Smoking status: Never   Smokeless tobacco: Never  Vaping Use   Vaping Use: Never used  Substance and Sexual Activity   Alcohol use: No   Drug use: No   Sexual activity: Not on file  Other Topics Concern   Not on file  Social History Narrative   Not on file   Social Determinants of Health   Financial Resource Strain: Low Risk    Difficulty of Paying Living Expenses: Not hard at all  Food Insecurity: No Food Insecurity   Worried About Estate manager/land agent of Food in the Last Year: Never true   [place] in the Last Year: Never true  Transportation Needs: No Transportation Needs   Lack of Transportation (Medical): No   Lack of Transportation (Non-Medical): No  Physical Activity: Inactive   Days of Exercise per Week: 0 days   Minutes of  Exercise per Session: 0 min  Stress: No Stress Concern Present   Feeling of Stress : Only a little  Social Connections: Moderately Integrated   Frequency of Communication with Friends and Family: More than three times a week   Frequency of Social Gatherings with Friends and Family: Three times a week   Attends Religious Services: More than 4 times per year   Active Member of Clubs or Organizations: No   Attends Archivist Meetings: Never   Marital Status: Married     Family History: The patient's family history includes Hypertension in his brother. ROS:   Please see the history of present illness.    All 14 point review of systems negative except as described per history of present illness  EKGs/Labs/Other Studies Reviewed:      Recent Labs: 01/08/2021: BUN 33; Creatinine, Ser  1.26; Potassium 5.1; Sodium 140  Recent Lipid Panel    Component Value Date/Time   CHOL 94 01/08/2021 1534   CHOL 130 12/03/2019 0837   TRIG 57.0 01/08/2021 1534   HDL 52.20 01/08/2021 1534   HDL 69 12/03/2019 0837   CHOLHDL 2 01/08/2021 1534   VLDL 11.4 01/08/2021 1534   LDLCALC 30 01/08/2021 1534   LDLCALC 49 12/03/2019 0837    Physical Exam:    VS:  There were no vitals taken for this visit.    Wt Readings from Last 3 Encounters:  10/17/21 154 lb 12.8 oz (70.2 kg)  08/21/21 155 lb 3.2 oz (70.4 kg)  07/18/21 160 lb 12.8 oz (72.9 kg)     GEN:  Well nourished, well developed in no acute distress HEENT: Normal NECK: No JVD; No carotid bruits LYMPHATICS: No lymphadenopathy CARDIAC: RRR, no murmurs, no rubs, no gallops RESPIRATORY:  Clear to auscultation without rales, wheezing or rhonchi  ABDOMEN: Soft, non-tender, non-distended MUSCULOSKELETAL:  No edema; No deformity  SKIN: Warm and dry LOWER EXTREMITIES: no swelling NEUROLOGIC:  Alert and oriented x 3 PSYCHIATRIC:  Normal affect   ASSESSMENT:    1. Coronary artery disease involving native coronary artery of native heart  without angina pectoris   2. Hypertension associated with diabetes ([place])   3. Orthostatic hypotension   4. Atypical chest pain    PLAN:    In order of problems listed above:  Coronary disease: Stable from that point review, denies have any chest pain tightness squeezing pressure burning chest.  Continue present management Essential hypertension: Blood pressure appears to be well controlled. Orthostatic hypotension denies have any recent complaints Atypical chest pain denies having any   Medication Adjustments/Labs and Tests Ordered: Current medicines are reviewed at length with the patient today.  Concerns regarding medicines are outlined above.  No orders of the defined types were placed in this encounter.  Medication changes: No orders of the defined types were placed in this encounter.   Signed, George Liter, [place], [facility] 11/05/2021 1:11 PM    [place]

## 2021-11-05 NOTE — Patient Instructions (Signed)
Below is our plan:  We will start low dose primidone 50mg  daily. I would like for you to start 1/2 tablet daily for 1-2 weeks. If well tolerated, increase dose to 1 full tablet daily.   Please make sure you are staying well hydrated. I recommend 50-60 ounces daily. Well balanced diet and regular exercise encouraged. Consistent sleep schedule with 6-8 hours recommended.   Please continue follow up with care team as directed.   Follow up with me in 4-6 months   You may receive a survey regarding today's visit. I encourage you to leave honest feed back as I do use this information to improve patient care. Thank you for seeing me today!

## 2021-11-05 NOTE — Progress Notes (Signed)
Cardiology Office Note:    Date:  11/05/2021   ID:  George [place], DOB Dec 13, 1932, MRN [id number]  PCP:  Haydee Salter, MD  Cardiologist:  Jenne Campus, MD    Referring MD: Haydee Salter, MD   Chief Complaint  Patient presents with   Follow-up  Cardiac wise I am doing well  History of Present Illness:    George Barnett is a 87 y.o. male   with past medical history significant for coronary artery disease apparently in 1999 he suffered from small myocardial infarction that required stenting.  I do not have details regarding that event.  Last stress test done in 2020 showed no evidence of ischemia, left ventricle ejection fraction at that time was normal.  His EKG is normal.  He also got diabetes mellitus, dyslipidemia, essential hypertension.  Initially he was sent to [place] because of orthostatic hypotension but then seems to be controlled.  He does have some chronic disease of his scalp and usually conversation with him revolve around this problem which bothers him a lot. Overall cardiac wise doing well denies have any chest pain tightness squeezing pressure burning chest no palpitations no dizziness.  Biggest concern that he got days like always scalp pain as well as some tremor.  Past Medical History:  Diagnosis Date   Abdominal pain 03/25/2017   Anemia due to chemical agent (HCC)-dermatologic medications most likely 12/08/2019   Atypical chest pain 05/07/2019   Benign essential tremor 12/08/2019   Bilateral lower extremity edema 11/17/2017   Brittle diabetes mellitus ([place]) 01/12/2020   CAD (coronary artery disease) 03/25/2017   Cancer [facility])    SKIN CANCER    Cataract    Chronic head/ "scalp" pain 01/12/2020   Per patient the pain of his scalp is like "bees stinging you all over on your head "he says the slightest touch to the area causes significant pain/discomfort   Costochondritis 05/05/2017   --->pt had mentioned costochondritis at his last office visit with his former PCP  prior to transferring care to [place]:  [name], MD - 02/24/2017 10:30 AM EDT Formatting of this note may be different from the original. George Barnett is a 87 y.o. male who is here for a follow up visit.  DM: He is taking metformin and glimepiride and tolerates these well. His A1C was 7.1 on recent labs.  HTN: He   Decreased activity 12/08/2019   Dehydration- mild 12/08/2019   Diabetes mellitus without complication (HCC)    Edema, peripheral 11/22/2014   Elbow effusion, left    Erosive pustular dermatosis of scalp 8/65/7846   Folliculitis- scalp; txed by Derm- Dr Jarome Matin 07/27/2018   Frequency of urination    GAD (generalized anxiety disorder) 12/10/2019   GERD (gastroesophageal reflux disease)    Glossodynia 05/05/2017   [name], MD - 11/22/2016 2:15 PM EST Formatting of this note may be different from the original. Otolaryngology Initial Consultation Note  Requesting Attending Physician: [name], MD Service Requesting Consult: No service for patient encounter.  History of Present Illness:   The patient is being seen in consultation with [name], MD for the evaluation of tongu   Heart attack [facility])    Heart disease    High risk medication use 01/15/2019   Hypertension    Hypertension associated with diabetes ([place]) 08/05/2017   Microalbuminuria due to type 2 diabetes mellitus ([place]) 08/05/2017   Mixed diabetic hyperlipidemia associated with type 2 diabetes mellitus ([place]) 08/05/2017   Orthostatic  hypotension 11/05/2018   Primary osteoarthritis of right shoulder 01/24/2015   Prostate enlargement    Recurrent right inguinal hernia 05/18/2019   RLS (restless legs syndrome) 06/17/2017   Snoring 03/25/2017   Stage 3 chronic kidney disease ([place]) 04/22/2017   Stage 4 chronic kidney disease ([place]) 09/08/2020   Stress and adjustment reaction 01/12/2020   Tremor of both hands-  both wiht rest and activity; acutely worse 11/27/2018   Tremors of nervous system    Type 2 diabetes  mellitus with stage 3 chronic kidney disease, without long-term current use of insulin ([place]) 05/05/2017   Urinary retention due to benign prostatic hyperplasia 01/25/2015   Venous insufficiency of both lower extremities 11/27/2018   White coat syndrome with diagnosis of hypertension 03/10/2020    Past Surgical History:  Procedure Laterality Date   CARDIAC CATHETERIZATION     1999   cardio stint     HERNIA REPAIR     BILAT    INGUINAL HERNIA REPAIR  05/18/2019   INGUINAL HERNIA REPAIR Right 05/18/2019   Procedure: REPAIR RIGHT INGUINAL HERNIA WITH MESH;  Surgeon: Erroll Luna, MD;  Location: [place];  Service: General;  Laterality: Right;   MOHS SURGERY     BILAT EARS    PROSTATE BIOPSY     TOTAL SHOULDER ARTHROPLASTY Right 01/24/2015   Procedure: RIGHT TOTAL SHOULDER ARTHROPLASTY;  Surgeon: Marchia Bond, MD;  Location: [place];  Service: Orthopedics;  Laterality: Right;    Current Medications: Current Meds  Medication Sig   aspirin 81 MG tablet Take 81 mg by mouth every evening.    atorvastatin (LIPITOR) 10 MG tablet Take 1 tablet (10 mg total) by mouth every other day.   [employer] PAIN RELIEF 4 % CREA Apply topically as needed.   folic acid (FOLVITE) 1 MG tablet Take 1 mg by mouth daily. except Mondays   furosemide (LASIX) 20 MG tablet Take 1 tablet (20 mg total) by mouth daily.   gabapentin (NEURONTIN) 100 MG capsule Take 100 mg by mouth at bedtime.   glimepiride (AMARYL) 1 MG tablet TAKE 1 TABLET DAILY WITH   BREAKFAST   JARDIANCE 25 MG TABS tablet TAKE 1 TABLET DAILY BEFORE BREAKFAST   losartan (COZAAR) 25 MG tablet Take 1 tablet (25 mg total) by mouth daily.   metFORMIN (GLUCOPHAGE) 500 MG tablet Take 1,000 mg by mouth 2 (two) times daily with a meal. Take 1000mg  in am and 500mg  in pm   metFORMIN (GLUCOPHAGE) 500 MG tablet Take by mouth as directed. Take 1000mg  in am and 500mg  in pm   methotrexate (RHEUMATREX) 2.5 MG tablet Take 15 mg by mouth once a week.   mupirocin ointment  (BACTROBAN) 2 % APPLY TOPICALLY DAILY. TO OPEN SORES ON SCALP   NONFORMULARY OR COMPOUNDED ITEM Apply topically in the morning and at bedtime. Tofacitinib/niacinamide   Polyethyl Glycol-Propyl Glycol (SYSTANE OP) Place 1 drop into both eyes daily. Unknown stregth   polyethylene glycol (MIRALAX / GLYCOLAX) packet Take 17 g by mouth daily as needed for mild constipation.    predniSONE (DELTASONE) 10 MG tablet Take 4 PO QAM x 5 days, 3 PO QAM x 5 days, 2 PO QAM x 5 days, 1 PO QAM x 5 days, 1/2 PO QAM x 5 days then STOP   Probiotic Product (PROBIOTIC DAILY PO) Take 1 capsule by mouth daily with lunch. Unknown strength   tacrolimus (PROTOPIC) 0.1 % ointment Apply 1 application topically 2 (two) times daily.   triamcinolone cream (KENALOG) 0.1 %  Apply 1 application topically 2 (two) times daily.     Allergies:   Exenatide, Meperidine and related, Nadolol, and Sitagliptin phosphate [sitagliptin]   Social History   Socioeconomic History   Marital status: Married    Spouse name: Not on file   Number of children: Not on file   Years of education: Not on file   Highest education level: Not on file  Occupational History   Not on file  Tobacco Use   Smoking status: Never   Smokeless tobacco: Never  Vaping Use   Vaping Use: Never used  Substance and Sexual Activity   Alcohol use: No   Drug use: No   Sexual activity: Not on file  Other Topics Concern   Not on file  Social History Narrative   Not on file   Social Determinants of Health   Financial Resource Strain: Low Risk    Difficulty of Paying Living Expenses: Not hard at all  Food Insecurity: No Food Insecurity   Worried About Charity fundraiser in the Last Year: Never true   George [place] in the Last Year: Never true  Transportation Needs: No Transportation Needs   Lack of Transportation (Medical): No   Lack of Transportation (Non-Medical): No  Physical Activity: Inactive   Days of Exercise per Week: 0 days   Minutes of  Exercise per Session: 0 min  Stress: No Stress Concern Present   Feeling of Stress : Only a little  Social Connections: Moderately Integrated   Frequency of Communication with Friends and Family: More than three times a week   Frequency of Social Gatherings with Friends and Family: Three times a week   Attends Religious Services: More than 4 times per year   Active Member of Clubs or Organizations: No   Attends Archivist Meetings: Never   Marital Status: Married     Family History: The patient's family history includes Hypertension in his brother. ROS:   Please see the history of present illness.    All 14 point review of systems negative except as described per history of present illness  EKGs/Labs/Other Studies Reviewed:      Recent Labs: 01/08/2021: BUN 33; Creatinine, Ser 1.26; Potassium 5.1; Sodium 140  Recent Lipid Panel    Component Value Date/Time   CHOL 94 01/08/2021 1534   CHOL 130 12/03/2019 0837   TRIG 57.0 01/08/2021 1534   HDL 52.20 01/08/2021 1534   HDL 69 12/03/2019 0837   CHOLHDL 2 01/08/2021 1534   VLDL 11.4 01/08/2021 1534   LDLCALC 30 01/08/2021 1534   LDLCALC 49 12/03/2019 0837    Physical Exam:    VS:  BP 114/60 (BP Location: Left Arm, Patient Position: Sitting, Cuff Size: Normal)    Pulse 78    Ht 6' (1.829 m)    Wt 155 lb (70.3 kg)    SpO2 97%    BMI 21.02 kg/m     Wt Readings from Last 3 Encounters:  11/05/21 155 lb (70.3 kg)  10/17/21 154 lb 12.8 oz (70.2 kg)  08/21/21 155 lb 3.2 oz (70.4 kg)     GEN:  Well nourished, well developed in no acute distress HEENT: Normal NECK: No JVD; No carotid bruits LYMPHATICS: No lymphadenopathy CARDIAC: RRR, no murmurs, no rubs, no gallops RESPIRATORY:  Clear to auscultation without rales, wheezing or rhonchi  ABDOMEN: Soft, non-tender, non-distended MUSCULOSKELETAL:  No edema; No deformity  SKIN: Warm and dry LOWER EXTREMITIES: no swelling NEUROLOGIC:  Alert  and oriented x  3 PSYCHIATRIC:  Normal affect   ASSESSMENT:    1. Coronary artery disease involving native coronary artery of native heart without angina pectoris   2. Hypertension associated with diabetes ([place])   3. Orthostatic hypotension   4. Atypical chest pain    PLAN:    In order of problems listed above:  Coronary disease with hemiparesis and standing long time ago in 1999 doing well from that point review, he is on antiplatelet therapy which I will continue Essential hypertension blood pressure well controlled today 114/60 we will continue present management Tremor which appears to be worse he does have appoint with neurologist tomorrow.  I hope this is not Parkinson.  I told him if there is need to use beta-blocker in his situation should be no problem. Dyslipidemia I did review K PN which show me his LDL 30 HDL 52 this is from February last year we will make arrangements for another fasting lipid profile Hemoglobin A1c from 30 November of this year 8.9 clearly not well controlled but that being worked up by primary care physician   Medication Adjustments/Labs and Tests Ordered: Current medicines are reviewed at length with the patient today.  Concerns regarding medicines are outlined above.  No orders of the defined types were placed in this encounter.  Medication changes: No orders of the defined types were placed in this encounter.   Signed, Park Liter, MD, [facility] 11/05/2021 1:33 PM    [facility]

## 2021-11-06 ENCOUNTER — Ambulatory Visit (INDEPENDENT_AMBULATORY_CARE_PROVIDER_SITE_OTHER): Payer: Medicare Other | Admitting: Family Medicine

## 2021-11-06 ENCOUNTER — Encounter: Payer: Self-pay | Admitting: Family Medicine

## 2021-11-06 VITALS — BP 153/77 | HR 77 | Ht 72.0 in | Wt 156.0 lb

## 2021-11-06 DIAGNOSIS — G250 Essential tremor: Secondary | ICD-10-CM

## 2021-11-06 MED ORDER — PRIMIDONE 50 MG PO TABS: 50.0000 mg | ORAL_TABLET | Freq: Every day | ORAL | 1 refills | Status: DC

## 2021-11-08 DIAGNOSIS — Z4802 Encounter for removal of sutures: Secondary | ICD-10-CM | POA: Diagnosis not present

## 2021-11-09 DIAGNOSIS — L565 Disseminated superficial actinic porokeratosis (DSAP): Secondary | ICD-10-CM | POA: Diagnosis not present

## 2021-11-09 DIAGNOSIS — L988 Other specified disorders of the skin and subcutaneous tissue: Secondary | ICD-10-CM | POA: Diagnosis not present

## 2021-11-09 DIAGNOSIS — Z5181 Encounter for therapeutic drug level monitoring: Secondary | ICD-10-CM | POA: Diagnosis not present

## 2021-11-09 DIAGNOSIS — Z85828 Personal history of other malignant neoplasm of skin: Secondary | ICD-10-CM | POA: Diagnosis not present

## 2021-11-09 DIAGNOSIS — L570 Actinic keratosis: Secondary | ICD-10-CM | POA: Diagnosis not present

## 2021-11-15 ENCOUNTER — Telehealth: Payer: Self-pay | Admitting: Family Medicine

## 2021-11-15 ENCOUNTER — Encounter: Payer: Self-pay | Admitting: Family Medicine

## 2021-11-15 NOTE — Telephone Encounter (Signed)
Pt called and said he was returning a call but I didn't see a note on who called him. Sending back to [facility]

## 2021-11-15 NOTE — Telephone Encounter (Signed)
Noted.... I had called for his wife.  Dm/cma

## 2021-12-04 ENCOUNTER — Other Ambulatory Visit: Payer: Self-pay | Admitting: Family Medicine

## 2021-12-04 DIAGNOSIS — E1122 Type 2 diabetes mellitus with diabetic chronic kidney disease: Secondary | ICD-10-CM

## 2021-12-04 DIAGNOSIS — N183 Chronic kidney disease, stage 3 unspecified: Secondary | ICD-10-CM

## 2021-12-17 DIAGNOSIS — Z961 Presence of intraocular lens: Secondary | ICD-10-CM | POA: Diagnosis not present

## 2021-12-17 LAB — HM DIABETES EYE EXAM

## 2021-12-18 ENCOUNTER — Encounter: Payer: Self-pay | Admitting: Family Medicine

## 2021-12-19 ENCOUNTER — Encounter: Payer: Self-pay | Admitting: Family Medicine

## 2022-01-16 ENCOUNTER — Other Ambulatory Visit: Payer: Self-pay

## 2022-01-17 ENCOUNTER — Ambulatory Visit (INDEPENDENT_AMBULATORY_CARE_PROVIDER_SITE_OTHER): Payer: Medicare Other | Admitting: Family Medicine

## 2022-01-17 ENCOUNTER — Encounter: Payer: Self-pay | Admitting: Family Medicine

## 2022-01-17 VITALS — BP 142/80 | HR 82 | Temp 97.9°F | Ht 72.0 in | Wt 147.4 lb

## 2022-01-17 DIAGNOSIS — L988 Other specified disorders of the skin and subcutaneous tissue: Secondary | ICD-10-CM | POA: Diagnosis not present

## 2022-01-17 DIAGNOSIS — I10 Essential (primary) hypertension: Secondary | ICD-10-CM

## 2022-01-17 DIAGNOSIS — E1122 Type 2 diabetes mellitus with diabetic chronic kidney disease: Secondary | ICD-10-CM

## 2022-01-17 DIAGNOSIS — R634 Abnormal weight loss: Secondary | ICD-10-CM | POA: Diagnosis not present

## 2022-01-17 DIAGNOSIS — E782 Mixed hyperlipidemia: Secondary | ICD-10-CM

## 2022-01-17 DIAGNOSIS — N1831 Chronic kidney disease, stage 3a: Secondary | ICD-10-CM | POA: Diagnosis not present

## 2022-01-17 DIAGNOSIS — E785 Hyperlipidemia, unspecified: Secondary | ICD-10-CM | POA: Insufficient documentation

## 2022-01-17 LAB — COMPREHENSIVE METABOLIC PANEL
ALT: 12 U/L (ref 0–53)
AST: 19 U/L (ref 0–37)
Albumin: 4.4 g/dL (ref 3.5–5.2)
Alkaline Phosphatase: 50 U/L (ref 39–117)
BUN: 43 mg/dL — ABNORMAL HIGH (ref 6–23)
CO2: 25 mEq/L (ref 19–32)
Calcium: 9.6 mg/dL (ref 8.4–10.5)
Chloride: 107 mEq/L (ref 96–112)
Creatinine, Ser: 1.42 mg/dL (ref 0.40–1.50)
GFR: 44.55 mL/min — ABNORMAL LOW (ref >60.00)
Glucose, Bld: 131 mg/dL — ABNORMAL HIGH (ref 70–99)
Potassium: 5.1 mEq/L (ref 3.5–5.1)
Sodium: 141 mEq/L (ref 135–145)
Total Bilirubin: 0.7 mg/dL (ref 0.2–1.2)
Total Protein: 6.3 g/dL (ref 6.0–8.3)

## 2022-01-17 LAB — URINALYSIS, ROUTINE W REFLEX MICROSCOPIC
Bilirubin Urine: NEGATIVE
Hgb urine dipstick: NEGATIVE
Leukocytes,Ua: NEGATIVE
Nitrite: NEGATIVE
RBC / HPF: NONE SEEN (ref 0–?)
Specific Gravity, Urine: 1.015 (ref 1.000–1.030)
Total Protein, Urine: NEGATIVE
Urine Glucose: >=1000 — AB
Urobilinogen, UA: 0.2 (ref 0.0–1.0)
pH: 5.5 (ref 5.0–8.0)

## 2022-01-17 LAB — CBC
HCT: 37.4 % — ABNORMAL LOW (ref 39.0–52.0)
Hemoglobin: 12.6 g/dL — ABNORMAL LOW (ref 13.0–17.0)
MCHC: 33.7 g/dL (ref 30.0–36.0)
MCV: 107 fl — ABNORMAL HIGH (ref 78.0–100.0)
Platelets: 193 10*3/uL (ref 150.0–400.0)
RBC: 3.49 Mil/uL — ABNORMAL LOW (ref 4.22–5.81)
RDW: 14.8 % (ref 11.5–15.5)
WBC: 7.9 10*3/uL (ref 4.0–10.5)

## 2022-01-17 LAB — MICROALBUMIN / CREATININE URINE RATIO
Creatinine,U: 75.6 mg/dL
Microalb Creat Ratio: 1.2 mg/g (ref 0.0–30.0)
Microalb, Ur: 0.9 mg/dL (ref 0.0–1.9)

## 2022-01-17 LAB — LIPID PANEL
Cholesterol: 114 mg/dL (ref 0–200)
HDL: 53.1 mg/dL (ref >39.00)
LDL Cholesterol: 44 mg/dL (ref 0–99)
NonHDL: 60.51
Total CHOL/HDL Ratio: 2
Triglycerides: 83 mg/dL (ref 0.0–149.0)
VLDL: 16.6 mg/dL (ref 0.0–40.0)

## 2022-01-17 LAB — HEMOGLOBIN A1C: Hgb A1c MFr Bld: 7.4 % — ABNORMAL HIGH (ref 4.6–6.5)

## 2022-01-17 LAB — TSH: TSH: 2.28 u[IU]/mL (ref 0.35–5.50)

## 2022-01-17 NOTE — Progress Notes (Signed)
[facility] [address] [place] [address] Dept: [phone] Dept Fax: [phone]  Chronic Care Office Visit  Subjective:    Patient ID: George Barnett, male    DOB: Jul 04, 1933, 88 y.o..   MRN: [id number]  Chief Complaint  Patient presents with   Follow-up    3 month f/u.  Fasting today.  C/o not having appetite (lost 9 lbs).      History of Present Illness:  Patient is in today for reassessment of chronic medical issues.  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 He is currently on a regimen of alternating use of tacrolimus and clobetasol.  He felt his scalp was doing better, but in the past 2 days, he had a new eruptions of bumps on his scalp.  George Barnett's weight is down about 9 lbs. from his last visit. He notes he has been under significant stress. His wife had recent shoulder surgery, for which he is needing to provide her with more care. He has had difficulty at times with reliable assistance and other personal stressors. He notes this has decreased his appetite.   George Barnett has a history of Type 2 DM managed with metformin, glimepiride, and Jardiance. He has had prior ADRs with exenatide and sitigliptin. We had discussed a once daily basal insulin injection, but he notes he is afraid to use such. He has friedns that have had hypoglycemic episodes with insulin use.   George Barnett has hypertension and hyperlipidemia.  He is on losartan for blood pressure control and atorvastatin for his lipids.  Past Medical History: Patient Active Problem List   Diagnosis Date Noted   Hyperlipidemia 01/17/2022   Basal cell carcinoma 10/29/2021   Squamous cell carcinoma of antihelix, right 11/02/2020   Prostate enlargement    History of myocardial infarction    Elbow effusion, left    Cataract    Stage 3a chronic kidney disease (CKD) ([place]) 09/08/2020   White coat syndrome with diagnosis of hypertension 03/10/2020    Stress and adjustment reaction 01/12/2020   Chronic head/ "scalp" pain 01/12/2020   Anemia due to chemical agent (HCC)-dermatologic medications most likely 12/08/2019   Erosive pustular dermatosis of scalp 12/08/2019   Benign essential tremor 12/08/2019   Vitamin D deficiency 12/08/2019   Recurrent right inguinal hernia 05/18/2019   Atypical chest pain 05/07/2019   Venous insufficiency of both lower extremities 11/27/2018   Tremor of both hands-  both wiht rest and activity; acutely worse 11/27/2018   Orthostatic hypotension 11/05/2018   Bilateral lower extremity edema 11/17/2017   Essential hypertension 08/05/2017   Microalbuminuria due to type 2 diabetes mellitus ([place]) 08/05/2017   RLS (restless legs syndrome) 06/17/2017   GERD (gastroesophageal reflux disease) 05/05/2017   Glossodynia 05/05/2017   Costochondritis 05/05/2017   Type 2 diabetes mellitus with stage 3 chronic kidney disease, without long-term current use of insulin ([place]) 05/05/2017   CAD (coronary artery disease) 03/25/2017   Snoring 03/25/2017   Urinary retention due to benign prostatic hyperplasia 01/25/2015   Primary osteoarthritis of right shoulder 01/24/2015   Past Surgical History:  Procedure Laterality Date   CARDIAC CATHETERIZATION     1999   cardio stint     HERNIA REPAIR     BILAT    INGUINAL HERNIA REPAIR  05/18/2019   INGUINAL HERNIA REPAIR Right 05/18/2019   Procedure: REPAIR RIGHT INGUINAL HERNIA WITH MESH;  Surgeon: Erroll Luna, MD;  Location: [facility];  Service: General;  Laterality: Right;  MOHS SURGERY     BILAT EARS    PROSTATE BIOPSY     TOTAL SHOULDER ARTHROPLASTY Right 01/24/2015   Procedure: RIGHT TOTAL SHOULDER ARTHROPLASTY;  Surgeon: Marchia Bond, MD;  Location: [facility];  Service: Orthopedics;  Laterality: Right;   Family History  Problem Relation Age of Onset   Hypertension Brother    Outpatient Medications Prior to Visit  Medication Sig Dispense Refill   aspirin 81 MG tablet Take  81 mg by mouth every evening.      atorvastatin (LIPITOR) 10 MG tablet Take 1 tablet (10 mg total) by mouth every other day. 45 tablet 3   clobetasol ointment (TEMOVATE) 0.05 % Apply topically.     [employer] PAIN RELIEF 4 % CREA Apply topically as needed.     folic acid (FOLVITE) 1 MG tablet Take 1 mg by mouth daily. except Mondays     furosemide (LASIX) 20 MG tablet Take 1 tablet (20 mg total) by mouth daily. 90 tablet 1   gabapentin (NEURONTIN) 100 MG capsule Take 100 mg by mouth at bedtime.     glimepiride (AMARYL) 1 MG tablet TAKE 1 TABLET DAILY WITH   BREAKFAST. 90 tablet 1   JARDIANCE 25 MG TABS tablet TAKE 1 TABLET DAILY BEFORE BREAKFAST 90 tablet 1   losartan (COZAAR) 25 MG tablet Take 1 tablet (25 mg total) by mouth daily. 90 tablet 3   metFORMIN (GLUCOPHAGE) 500 MG tablet Take 1,000 mg by mouth 2 (two) times daily with a meal. Take 1000mg  in am and 500mg  in pm     methotrexate (RHEUMATREX) 2.5 MG tablet Take 15 mg by mouth once a week.     mupirocin ointment (BACTROBAN) 2 % On ARM     NONFORMULARY OR COMPOUNDED ITEM Apply topically in the morning and at bedtime. Tofacitinib/niacinamide     Polyethyl Glycol-Propyl Glycol (SYSTANE OP) Place 1 drop into both eyes daily. Unknown stregth     polyethylene glycol (MIRALAX / GLYCOLAX) packet Take 17 g by mouth daily as needed for mild constipation.      predniSONE (DELTASONE) 10 MG tablet Take 4 PO QAM x 5 days, 3 PO QAM x 5 days, 2 PO QAM x 5 days, 1 PO QAM x 5 days, 1/2 PO QAM x 5 days then STOP     primidone (MYSOLINE) 50 MG tablet Take 1 tablet (50 mg total) by mouth daily. 90 tablet 1   Probiotic Product (PROBIOTIC DAILY PO) Take 1 capsule by mouth daily with lunch. Unknown strength     tacrolimus (PROTOPIC) 0.1 % ointment Apply 1 application topically 2 (two) times daily.     metFORMIN (GLUCOPHAGE) 500 MG tablet Take by mouth as directed. Take 1000mg  in am and 500mg  in pm     No facility-administered medications prior to visit.   Allergies   Allergen Reactions   Exenatide Nausea And Vomiting   Meperidine And Related Other (See Comments)    Red streak, that happened once while having an IV   Nadolol Other (See Comments)    Numbness in fingers   Sitagliptin Phosphate [Sitagliptin] Other (See Comments)    Weight loss      Objective:   Today's Vitals   01/17/22 0833  BP: (!) 142/80  Pulse: 82  Temp: 97.9 F (36.6 C)  TempSrc: Temporal  SpO2: 98%  Weight: 147 lb 6.4 oz (66.9 kg)  Height: 6' (1.829 m)   Body mass index is 19.99 kg/m.   General: Well developed, well nourished.  No acute distress. Scalp: The skin is very thin, shiny, with pigmentary macules and superficial telangectasias.   There is a region of superficial ulcerations, some of which have purulent exudates. Psych: Alert and oriented. Normal mood and affect.  Health Maintenance Due  Topic Date Due   TETANUS/TDAP  04/25/2020   Zoster Vaccines- Shingrix (2 of 2) 01/10/2022   Lab Results Last hemoglobin A1c Lab Results  Component Value Date   HGBA1C 8.9 (H) 10/17/2021   Assessment & Plan:   1. Type 2 diabetes mellitus with stage 3a chronic kidney disease, without long-term current use of insulin ([place]) Due for annual DM labs. We discussed that if his A1c remains high, we might try pioglitazone rather than moving to insulin. Otherwise he will continue metformin, glimepiride, and empagliflozin.  - Microalbumin / creatinine urine ratio - Hemoglobin A1c - Urinalysis, Routine w reflex microscopic - Comprehensive metabolic panel  2. Stage 3a chronic kidney disease (CKD) (HCC) We will reassess his GFR.  - Comprehensive metabolic panel  3. Erosive pustular dermatosis of scalp Mr. Greis appears to be having a flare of this. I recommend he reach out to his dermatologist to determine what action he needs to take.  4. Essential hypertension Blood pressure is mildly high today. Mr. Coufal has had issues with a degree of white coat hypertension. He  is also at risk for orthostasis. I will continue his losartan.  - Microalbumin / creatinine urine ratio - Comprehensive metabolic panel  5. Weight loss Although Mr. Rylander feels his weight is down due to stress, I will check additional labs to assess for other potential causes and to check on his nutritional status.  - Comprehensive metabolic panel - TSH - CBC  6. Mixed hyperlipidemia Due for repeat lipids. Continue atorvastatin.  - Lipid panel   Return in about 3 months (around 04/19/2022) for Reassessment.   Haydee Salter, MD

## 2022-02-07 DIAGNOSIS — L988 Other specified disorders of the skin and subcutaneous tissue: Secondary | ICD-10-CM | POA: Diagnosis not present

## 2022-02-07 DIAGNOSIS — D0439 Carcinoma in situ of skin of other parts of face: Secondary | ICD-10-CM | POA: Diagnosis not present

## 2022-02-07 DIAGNOSIS — Z79631 Long term (current) use of antimetabolite agent: Secondary | ICD-10-CM | POA: Diagnosis not present

## 2022-02-07 DIAGNOSIS — Z79899 Other long term (current) drug therapy: Secondary | ICD-10-CM | POA: Diagnosis not present

## 2022-02-07 DIAGNOSIS — Z79622 Long term (current) use of janus kinase inhibitor: Secondary | ICD-10-CM | POA: Diagnosis not present

## 2022-02-07 DIAGNOSIS — B354 Tinea corporis: Secondary | ICD-10-CM | POA: Diagnosis not present

## 2022-02-07 DIAGNOSIS — Z5181 Encounter for therapeutic drug level monitoring: Secondary | ICD-10-CM | POA: Diagnosis not present

## 2022-04-01 DIAGNOSIS — J300 Vasomotor rhinitis: Secondary | ICD-10-CM | POA: Diagnosis not present

## 2022-04-01 DIAGNOSIS — H6121 Impacted cerumen, right ear: Secondary | ICD-10-CM | POA: Diagnosis not present

## 2022-04-01 DIAGNOSIS — J3489 Other specified disorders of nose and nasal sinuses: Secondary | ICD-10-CM | POA: Diagnosis not present

## 2022-04-06 ENCOUNTER — Other Ambulatory Visit: Payer: Self-pay | Admitting: Family Medicine

## 2022-04-06 DIAGNOSIS — E119 Type 2 diabetes mellitus without complications: Secondary | ICD-10-CM

## 2022-04-08 ENCOUNTER — Other Ambulatory Visit: Payer: Self-pay | Admitting: Family Medicine

## 2022-04-08 DIAGNOSIS — E1169 Type 2 diabetes mellitus with other specified complication: Secondary | ICD-10-CM

## 2022-04-09 NOTE — Progress Notes (Unsigned)
No chief complaint on file.   HISTORY OF PRESENT ILLNESS:  04/09/22 ALL:  George Barnett returns for follow up for essential tremor. He was last seen 10/2021. Primidone started. Gabapentin was continued per dermatology. He was unable to tolerate '50mg'$  dosing and continued '25mg'$ .   11/06/2021 ALL:  George Barnett is a 88 y.o. male here today for follow up for essential tremor. He was last seen by Dr Rexene Alberts 04/2021 and advised to continue gabapentin '100mg'$  TID per dermatology. He did feel that TID dosing was helping and has recently decreased to '100mg'$  QHS. Tremor continues to be bothersome. He reports that it effects his ability to write and he has difficulty eating due to tremor. He is followed closely by PCP. Last creatinine normal 12/2020. He has follow up for CPE soon.    HISTORY (copied from Dr Guadelupe Sabin previous note)  George Barnett is an 88 year old right-handed gentleman with an underlying medical history of diabetes, hypertension, chronic kidney disease, hyperlipidemia, venous insufficiency, and history of orthostatic hypotension, who presents for Follow-up consultation of his essential tremor of several years duration.  The patient is accompanied by his wife again today.  I last saw him on 01/30/2021, at which time he reported that his tremor has become worse.  He did have a resting component, he was worried about Parkinson's disease but he did not have any telltale signs of parkinsonism.  We talked about treatment options for essential tremor.  I suggested we avoid a beta-blocker.  He was advised that we could consider low-dose Mysoline but I advised him to use gabapentin and low-dose for now, 100 mg at bedtime.  We talked about side effects of beta-blockers, especially in light of his orthostatic lightheadedness, and the risk for hypotension as well as his chronic kidney disease and the risk for side effects with Mysoline at length at the time.   Today, 05/03/21: He reports feeling about the same with  the regard to the tremor, perhaps slightly better.  He has significant pain on his scalp, he has to go to the dermatologist on 6 weekly basis get treatments for his pustular dermatitis.  Treatment at home and cleaning the debris at home on a day-to-day basis is extremely painful for him.  Dermatology had increased his gabapentin to 100 mg 3 times daily.  He is tolerating this.  In the past, perhaps a year or 2 ago when he tried gabapentin through dermatology he woke up with significant hearing loss and was too scared to continue with the gabapentin.  He is tolerating it at this time, no side effects reported.  He feels a little bit discouraged as to the painful scalp treatments he is going through but has been told by his dermatologist that things are looking better.  His wife also feels that there are patches that are looking much better than they used to.  Let us check on his kidney function from 01/08/2021 showed BUN of 33 and creatinine of 1.26.  He tries to hydrate well with water.  He drinks 1 cup of coffee in the morning and 1 soda during the day.  He has not had any recent falls.   REVIEW OF SYSTEMS: Out of a complete 14 system review of symptoms, the patient complains only of the following symptoms, skin irritation and pain, tremor, and all other reviewed systems are negative.   ALLERGIES: Allergies  Allergen Reactions   Exenatide Nausea And Vomiting   Meperidine And Related Other (See Comments)  Red streak, that happened once while having an IV   Nadolol Other (See Comments)    Numbness in fingers   Sitagliptin Phosphate [Sitagliptin] Other (See Comments)    Weight loss      HOME MEDICATIONS: Outpatient Medications Prior to Visit  Medication Sig Dispense Refill   aspirin 81 MG tablet Take 81 mg by mouth every evening.      atorvastatin (LIPITOR) 10 MG tablet TAKE 1 TABLET EVERY NIGHT  BEFORE BED 90 tablet 1   clobetasol ointment (TEMOVATE) 0.05 % Apply topically.     [facility] PAIN  RELIEF 4 % CREA Apply topically as needed.     folic acid (FOLVITE) 1 MG tablet Take 1 mg by mouth daily. except Mondays     furosemide (LASIX) 20 MG tablet Take 1 tablet (20 mg total) by mouth daily. 90 tablet 1   gabapentin (NEURONTIN) 100 MG capsule Take 100 mg by mouth at bedtime.     glimepiride (AMARYL) 1 MG tablet TAKE 1 TABLET DAILY WITH   BREAKFAST. 90 tablet 1   JARDIANCE 25 MG TABS tablet TAKE 1 TABLET DAILY BEFORE BREAKFAST 90 tablet 3   losartan (COZAAR) 25 MG tablet Take 1 tablet (25 mg total) by mouth daily. 90 tablet 3   metFORMIN (GLUCOPHAGE) 500 MG tablet Take 1,000 mg by mouth 2 (two) times daily with a meal. Take '1000mg'$  in am and '500mg'$  in pm     methotrexate (RHEUMATREX) 2.5 MG tablet Take 15 mg by mouth once a week.     mupirocin ointment (BACTROBAN) 2 % On ARM     Polyethyl Glycol-Propyl Glycol (SYSTANE OP) Place 1 drop into both eyes daily. Unknown stregth     polyethylene glycol (MIRALAX / GLYCOLAX) packet Take 17 g by mouth daily as needed for mild constipation.      predniSONE (DELTASONE) 10 MG tablet Take 4 PO QAM x 5 days, 3 PO QAM x 5 days, 2 PO QAM x 5 days, 1 PO QAM x 5 days, 1/2 PO QAM x 5 days then STOP     primidone (MYSOLINE) 50 MG tablet Take 1 tablet (50 mg total) by mouth daily. 90 tablet 1   Probiotic Product (PROBIOTIC DAILY PO) Take 1 capsule by mouth daily with lunch. Unknown strength     tacrolimus (PROTOPIC) 0.1 % ointment Apply 1 application topically 2 (two) times daily.     No facility-administered medications prior to visit.     PAST MEDICAL HISTORY: Past Medical History:  Diagnosis Date   Abdominal pain 03/25/2017   Anemia due to chemical agent (HCC)-dermatologic medications most likely 12/08/2019   Atypical chest pain 05/07/2019   Benign essential tremor 12/08/2019   Bilateral lower extremity edema 11/17/2017   Brittle diabetes mellitus ([place]) 01/12/2020   CAD (coronary artery disease) 03/25/2017   Cancer [facility])    SKIN CANCER    Cataract     Chronic head/ "scalp" pain 01/12/2020   Per patient the pain of his scalp is like "bees stinging you all over on your head "he says the slightest touch to the area causes significant pain/discomfort   Costochondritis 05/05/2017   --->pt had mentioned costochondritis at his last office visit with his former PCP prior to transferring care to [place]:  [name], MD - 02/24/2017 10:30 AM EDT Formatting of this note may be different from the original. Walt is a 88 y.o. male who is here for a follow up visit.  DM: He is taking metformin and  glimepiride and tolerates these well. His A1C was 7.1 on recent labs.  HTN: He   Decreased activity 12/08/2019   Dehydration- mild 12/08/2019   Diabetes mellitus without complication (HCC)    Edema, peripheral 11/22/2014   Elbow effusion, left    Erosive pustular dermatosis of scalp 1/85/6314   Folliculitis- scalp; txed by Derm- Dr Jarome Matin 07/27/2018   Frequency of urination    GAD (generalized anxiety disorder) 12/10/2019   GERD (gastroesophageal reflux disease)    Glossodynia 05/05/2017   [name], MD - 11/22/2016 2:15 PM EST Formatting of this note may be different from the original. Otolaryngology Initial Consultation Note  Requesting Attending Physician: [name], MD Service Requesting Consult: No service for patient encounter.  History of Present Illness:   The patient is being seen in consultation with [name], MD for the evaluation of tongu   Heart attack [facility])    Heart disease    High risk medication use 01/15/2019   Hypertension    Hypertension associated with diabetes ([place]) 08/05/2017   Microalbuminuria due to type 2 diabetes mellitus ([place]) 08/05/2017   Mixed diabetic hyperlipidemia associated with type 2 diabetes mellitus ([place]) 08/05/2017   Orthostatic hypotension 11/05/2018   Primary osteoarthritis of right shoulder 01/24/2015   Prostate enlargement    Recurrent right inguinal hernia 05/18/2019   RLS (restless  legs syndrome) 06/17/2017   Snoring 03/25/2017   Stage 3 chronic kidney disease ([place]) 04/22/2017   Stage 4 chronic kidney disease ([place]) 09/08/2020   Stress and adjustment reaction 01/12/2020   Tremor of both hands-  both wiht rest and activity; acutely worse 11/27/2018   Tremors of nervous system    Type 2 diabetes mellitus with stage 3 chronic kidney disease, without long-term current use of insulin ([place]) 05/05/2017   Urinary retention due to benign prostatic hyperplasia 01/25/2015   Venous insufficiency of both lower extremities 11/27/2018   White coat syndrome with diagnosis of hypertension 03/10/2020     PAST SURGICAL HISTORY: Past Surgical History:  Procedure Laterality Date   CARDIAC CATHETERIZATION     1999   cardio stint     HERNIA REPAIR     BILAT    INGUINAL HERNIA REPAIR  05/18/2019   INGUINAL HERNIA REPAIR Right 05/18/2019   Procedure: REPAIR RIGHT INGUINAL HERNIA WITH MESH;  Surgeon: Erroll Luna, MD;  Location: [place];  Service: General;  Laterality: Right;   MOHS SURGERY     BILAT EARS    PROSTATE BIOPSY     TOTAL SHOULDER ARTHROPLASTY Right 01/24/2015   Procedure: RIGHT TOTAL SHOULDER ARTHROPLASTY;  Surgeon: Marchia Bond, MD;  Location: [place];  Service: Orthopedics;  Laterality: Right;     FAMILY HISTORY: Family History  Problem Relation Age of Onset   Hypertension Brother      SOCIAL HISTORY: Social History   Socioeconomic History   Marital status: Married    Spouse name: Not on file   Number of children: Not on file   Years of education: Not on file   Highest education level: Not on file  Occupational History   Not on file  Tobacco Use   Smoking status: Never   Smokeless tobacco: Never  Vaping Use   Vaping Use: Never used  Substance and Sexual Activity   Alcohol use: No   Drug use: No   Sexual activity: Not on file  Other Topics Concern   Not on file  Social History Narrative   Not on file  Social Determinants of Health   Financial Resource  Strain: Low Risk    Difficulty of Paying Living Expenses: Not hard at all  Food Insecurity: No Food Insecurity   Worried About Charity fundraiser in the Last Year: Never true   [place] in the Last Year: Never true  Transportation Needs: No Transportation Needs   Lack of Transportation (Medical): No   Lack of Transportation (Non-Medical): No  Physical Activity: Inactive   Days of Exercise per Week: 0 days   Minutes of Exercise per Session: 0 min  Stress: No Stress Concern Present   Feeling of Stress : Only a little  Social Connections: Moderately Integrated   Frequency of Communication with Friends and Family: More than three times a week   Frequency of Social Gatherings with Friends and Family: Three times a week   Attends Religious Services: More than 4 times per year   Active Member of Clubs or Organizations: No   Attends Archivist Meetings: Never   Marital Status: Married  Human resources officer Violence: Not At Risk   Fear of Current or Ex-Partner: No   Emotionally Abused: No   Physically Abused: No   Sexually Abused: No     PHYSICAL EXAM  There were no vitals filed for this visit.  There is no height or weight on file to calculate BMI.  Generalized: Well developed, in no acute distress  Cardiology: normal rate and rhythm, no murmur auscultated  Respiratory: clear to auscultation bilaterally    Neurological examination  Mentation: Alert oriented to time, place, history taking. Follows all commands speech and language fluent Cranial nerve II-XII: Pupils were equal round reactive to light. Extraocular movements were full, visual field were full on confrontational test. Facial sensation and strength were normal. Uvula tongue midline. Head turning and shoulder shrug  were normal and symmetric. Motor: The motor testing reveals 5 over 5 strength of all 4 extremities. Good symmetric motor tone is noted throughout. Mild to moderate bilateral action and postural  tremor, intermittent mild bilateral resting tremor.  Sensory: Sensory testing is intact to soft touch on all 4 extremities. No evidence of extinction is noted.  Coordination: Cerebellar testing reveals good finger-nose-finger and heel-to-shin bilaterally.  Gait and station: Gait is stable without assistive device, slight stooped posture, no shuffling noted.    DIAGNOSTIC DATA (LABS, IMAGING, TESTING) - I reviewed patient records, labs, notes, testing and imaging myself where available.  Lab Results  Component Value Date   WBC 7.9 01/17/2022   HGB 12.6 (L) 01/17/2022   HCT 37.4 (L) 01/17/2022   MCV 107.0 (H) 01/17/2022   PLT 193.0 01/17/2022      Component Value Date/Time   NA 141 01/17/2022 0921   NA 142 09/06/2020 1140   K 5.1 01/17/2022 0921   CL 107 01/17/2022 0921   CO2 25 01/17/2022 0921   GLUCOSE 131 (H) 01/17/2022 0921   BUN 43 (H) 01/17/2022 0921   BUN 28 (H) 09/06/2020 1140   CREATININE 1.42 01/17/2022 0921   CREATININE 1.23 03/07/2014 1454   CALCIUM 9.6 01/17/2022 0921   PROT 6.3 01/17/2022 0921   PROT 6.0 09/06/2020 1140   ALBUMIN 4.4 01/17/2022 0921   ALBUMIN 3.9 09/06/2020 1140   AST 19 01/17/2022 0921   ALT 12 01/17/2022 0921   ALKPHOS 50 01/17/2022 0921   BILITOT 0.7 01/17/2022 0921   BILITOT 0.5 09/06/2020 1140   GFRNONAA 50 (L) 09/06/2020 1140   GFRAA 58 (L) 09/06/2020  1140   Lab Results  Component Value Date   CHOL 114 01/17/2022   HDL 53.10 01/17/2022   LDLCALC 44 01/17/2022   TRIG 83.0 01/17/2022   CHOLHDL 2 01/17/2022   Lab Results  Component Value Date   HGBA1C 7.4 (H) 01/17/2022   Lab Results  Component Value Date   VITAMINB12 1,289 (H) 03/07/2020   Lab Results  Component Value Date   TSH 2.28 01/17/2022        View : No data to display.               View : No data to display.           ASSESSMENT AND PLAN  88 y.o. year old male  has a past medical history of Abdominal pain (03/25/2017), Anemia due to chemical  agent (HCC)-dermatologic medications most likely (12/08/2019), Atypical chest pain (05/07/2019), Benign essential tremor (12/08/2019), Bilateral lower extremity edema (11/17/2017), Brittle diabetes mellitus ([place]) (01/12/2020), CAD (coronary artery disease) (03/25/2017), Cancer ([place]), Cataract, Chronic head/ "scalp" pain (01/12/2020), Costochondritis (05/05/2017), Decreased activity (12/08/2019), Dehydration- mild (12/08/2019), Diabetes mellitus without complication ([place]), Edema, peripheral (11/22/2014), Elbow effusion, left, Erosive pustular dermatosis of scalp (12/15/7865), Folliculitis- scalp; txed by Derm- Dr Jarome Matin (07/27/2018), Frequency of urination, GAD (generalized anxiety disorder) (12/10/2019), GERD (gastroesophageal reflux disease), Glossodynia (05/05/2017), Heart attack ([place]), Heart disease, High risk medication use (01/15/2019), Hypertension, Hypertension associated with diabetes ([place]) (08/05/2017), Microalbuminuria due to type 2 diabetes mellitus ([place]) (08/05/2017), Mixed diabetic hyperlipidemia associated with type 2 diabetes mellitus ([place]) (08/05/2017), Orthostatic hypotension (11/05/2018), Primary osteoarthritis of right shoulder (01/24/2015), Prostate enlargement, Recurrent right inguinal hernia (05/18/2019), RLS (restless legs syndrome) (06/17/2017), Snoring (03/25/2017), Stage 3 chronic kidney disease ([place]) (04/22/2017), Stage 4 chronic kidney disease ([place]) (09/08/2020), Stress and adjustment reaction (01/12/2020), Tremor of both hands-  both wiht rest and activity; acutely worse (11/27/2018), Tremors of nervous system, Type 2 diabetes mellitus with stage 3 chronic kidney disease, without long-term current use of insulin ([place]) (05/05/2017), Urinary retention due to benign prostatic hyperplasia (01/25/2015), Venous insufficiency of both lower extremities (11/27/2018), and White coat syndrome with diagnosis of hypertension (03/10/2020). here with    No diagnosis found.  Mr Kauth has not noted any significant improvement  in tremor with gabapentin '100mg'$  TID. He is now taking '100mg'$  at bedtime and feels it only helps him rest better. He may continue per PCP/dermatology direction. We will start a low dose of primidone. He will take 1/2 tablet ('25mg'$ ) daily. If well tolerated after 1-2 weeks, he was advised to increase to full tablet ('50mg'$ ) daily. Side effects reviewed and educational material provided. He will continue close follow up with PCP. We will avoid multiple daily dosing at this time due to history of elevated creatinine. We will monitor closely if dose adjustments are needed. Healthy lifestyle habits encouraged. He will follow up with me in 4-6 months. He verbalizes understanding and agreement with this plan.    No orders of the defined types were placed in this encounter.    No orders of the defined types were placed in this encounter.     Debbora Presto, MSN, FNP-C 04/09/2022, 5:29 PM  [facility] [address] [place], [place] [address] [phone]

## 2022-04-09 NOTE — Patient Instructions (Signed)
Below is our plan:  We will continue primidone '25mg'$  daily. You may increase to '50mg'$  if needed. Continue to monitor BP at home. Follow up as directed with PCP and dermatology.   Please make sure you are staying well hydrated. I recommend 50-60 ounces daily. Well balanced diet and regular exercise encouraged. Consistent sleep schedule with 6-8 hours recommended.   Please continue follow up with care team as directed.   Follow up with me in 1 year   You may receive a survey regarding today's visit. I encourage you to leave honest feed back as I do use this information to improve patient care. Thank you for seeing me today!

## 2022-04-10 ENCOUNTER — Encounter: Payer: Self-pay | Admitting: Family Medicine

## 2022-04-10 ENCOUNTER — Ambulatory Visit (INDEPENDENT_AMBULATORY_CARE_PROVIDER_SITE_OTHER): Payer: Medicare Other | Admitting: Family Medicine

## 2022-04-10 VITALS — BP 180/82 | HR 61 | Ht 72.0 in | Wt 144.0 lb

## 2022-04-10 DIAGNOSIS — G250 Essential tremor: Secondary | ICD-10-CM | POA: Diagnosis not present

## 2022-04-10 MED ORDER — PRIMIDONE 50 MG PO TABS: 50.0000 mg | ORAL_TABLET | Freq: Every day | ORAL | 1 refills | Status: AC

## 2022-04-29 ENCOUNTER — Ambulatory Visit (INDEPENDENT_AMBULATORY_CARE_PROVIDER_SITE_OTHER): Payer: Medicare Other | Admitting: Family Medicine

## 2022-04-29 ENCOUNTER — Encounter: Payer: Self-pay | Admitting: Family Medicine

## 2022-04-29 VITALS — BP 122/70 | HR 68 | Temp 96.9°F | Ht 72.0 in | Wt 137.2 lb

## 2022-04-29 DIAGNOSIS — L988 Other specified disorders of the skin and subcutaneous tissue: Secondary | ICD-10-CM | POA: Diagnosis not present

## 2022-04-29 DIAGNOSIS — I10 Essential (primary) hypertension: Secondary | ICD-10-CM | POA: Diagnosis not present

## 2022-04-29 DIAGNOSIS — C44321 Squamous cell carcinoma of skin of nose: Secondary | ICD-10-CM

## 2022-04-29 DIAGNOSIS — R634 Abnormal weight loss: Secondary | ICD-10-CM | POA: Diagnosis not present

## 2022-04-29 DIAGNOSIS — N1831 Chronic kidney disease, stage 3a: Secondary | ICD-10-CM

## 2022-04-29 DIAGNOSIS — R519 Headache, unspecified: Secondary | ICD-10-CM | POA: Diagnosis not present

## 2022-04-29 DIAGNOSIS — E1122 Type 2 diabetes mellitus with diabetic chronic kidney disease: Secondary | ICD-10-CM | POA: Diagnosis not present

## 2022-04-29 DIAGNOSIS — G8929 Other chronic pain: Secondary | ICD-10-CM

## 2022-04-29 MED ORDER — GABAPENTIN 100 MG PO CAPS: 100.0000 mg | ORAL_CAPSULE | Freq: Two times a day (BID) | ORAL | 3 refills | Status: DC

## 2022-04-29 NOTE — Progress Notes (Signed)
[facility] [address] [place] [address] Dept: [phone] Dept Fax: [phone]  Chronic Care Office Visit  Subjective:    Patient ID: George Barnett, male    DOB: July 17, 1933, 88 y.o..   MRN: [id number]  Chief Complaint  Patient presents with   Follow-up    3 month f/u. C/o scalp issues and has lost 5 lbs.      History of Present Illness:  Patient is in today for reassessment of chronic medical issues.  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 He is currently on a regimen of alternating use of tacrolimus and clobetasol.  He is currently on a tapering steroid regimen (down to 2 tablets daily), but has not seen much improvement in his scalp. He is scheduled with Dr. John Giovanni (dermatology) to have a CO2 laser treatment of his scalp as a trial to help resolve his rash. In the meantime, he notes considerable pain associated with this. He is currently on gabapentin 100 mg daily. This helps his sleep a little bit.  Mr. [name] also notes he has a squamous cell carcinoma on his left nose. He I scheduled on Thursday for Moh's surgery for this.  Mr. [name] has issues with chronic rhinitis and serous otitis/eustachian tube dysfunction. He wa started on an Atroven nasal spray and this has reduced some of his rhinorrhea.   Mr. [name]'s weight is down about 17 lbs. since Nov. He finds that when he is not on prednisone, he has no appetite. He and his wife live at [facility] and are provided a continental breakfast and dinner daily.   Mr. [name] has a history of Type 2 DM managed with metformin, glimepiride, and Jardiance. He has had prior ADRs with exenatide and sitagliptin. We had discussed a once daily basal insulin injection, but he notes he is afraid of hypoglycemic episodes with insulin use.    Mr. [name] has hypertension and hyperlipidemia.  He is on losartan for blood pressure control and atorvastatin for his  lipids.  Past Medical History: Patient Active Problem List   Diagnosis Date Noted   Squamous cell cancer of skin of nose 04/29/2022   Hyperlipidemia 01/17/2022   Basal cell carcinoma 10/29/2021   Squamous cell carcinoma of antihelix, right 11/02/2020   Prostate enlargement    History of myocardial infarction    Elbow effusion, left    Cataract    Stage 3a chronic kidney disease (CKD) ([place]) 09/08/2020   White coat syndrome with diagnosis of hypertension 03/10/2020   Stress and adjustment reaction 01/12/2020   Chronic head/ "scalp" pain 01/12/2020   Anemia due to chemical agent (HCC)-dermatologic medications most likely 12/08/2019   Erosive pustular dermatosis of scalp 12/08/2019   Benign essential tremor 12/08/2019   Vitamin D deficiency 12/08/2019   Recurrent right inguinal hernia 05/18/2019   Atypical chest pain 05/07/2019   Venous insufficiency of both lower extremities 11/27/2018   Tremor of both hands-  both wiht rest and activity; acutely worse 11/27/2018   Orthostatic hypotension 11/05/2018   Bilateral lower extremity edema 11/17/2017   Essential hypertension 08/05/2017   Microalbuminuria due to type 2 diabetes mellitus ([place]) 08/05/2017   RLS (restless legs syndrome) 06/17/2017   GERD (gastroesophageal reflux disease) 05/05/2017   Glossodynia 05/05/2017   Costochondritis 05/05/2017   Type 2 diabetes mellitus with stage 3 chronic kidney disease, without long-term current use of insulin ([place]) 05/05/2017   CAD (coronary artery disease) 03/25/2017   Snoring 03/25/2017   Urinary retention due  to benign prostatic hyperplasia 01/25/2015   Primary osteoarthritis of right shoulder 01/24/2015   Past Surgical History:  Procedure Laterality Date   CARDIAC CATHETERIZATION     1999   cardio stint     HERNIA REPAIR     BILAT    INGUINAL HERNIA REPAIR  05/18/2019   INGUINAL HERNIA REPAIR Right 05/18/2019   Procedure: REPAIR RIGHT INGUINAL HERNIA WITH MESH;  Surgeon: Erroll Luna, MD;  Location: [facility];  Service: General;  Laterality: Right;   MOHS SURGERY     BILAT EARS    PROSTATE BIOPSY     TOTAL SHOULDER ARTHROPLASTY Right 01/24/2015   Procedure: RIGHT TOTAL SHOULDER ARTHROPLASTY;  Surgeon: Marchia Bond, MD;  Location: [facility];  Service: Orthopedics;  Laterality: Right;   Family History  Problem Relation Age of Onset   Hypertension Brother    Outpatient Medications Prior to Visit  Medication Sig Dispense Refill   acitretin (SORIATANE) 10 MG capsule      aspirin 81 MG tablet Take 81 mg by mouth every evening.      atorvastatin (LIPITOR) 10 MG tablet TAKE 1 TABLET EVERY NIGHT  BEFORE BED 90 tablet 1   clobetasol ointment (TEMOVATE) 0.05 % Apply topically.     Dapsone 5 % topical gel      folic acid (FOLVITE) 1 MG tablet Take 1 mg by mouth daily. except Mondays     furosemide (LASIX) 20 MG tablet Take 1 tablet (20 mg total) by mouth daily. 90 tablet 1   glimepiride (AMARYL) 1 MG tablet TAKE 1 TABLET DAILY WITH   BREAKFAST. 90 tablet 1   ipratropium (ATROVENT) 0.03 % nasal spray Instill 2 puffs in each nostril 2-3 times per day.     JARDIANCE 25 MG TABS tablet TAKE 1 TABLET DAILY BEFORE BREAKFAST 90 tablet 3   losartan (COZAAR) 25 MG tablet Take 1 tablet (25 mg total) by mouth daily. 90 tablet 3   metFORMIN (GLUCOPHAGE) 500 MG tablet Take 1,000 mg by mouth 2 (two) times daily with a meal. Take '1000mg'$  in am and '500mg'$  in pm     methotrexate (RHEUMATREX) 2.5 MG tablet Take 15 mg by mouth once a week.     Polyethyl Glycol-Propyl Glycol (SYSTANE OP) Place 1 drop into both eyes daily. Unknown stregth     polyethylene glycol (MIRALAX / GLYCOLAX) packet Take 17 g by mouth daily as needed for mild constipation.      predniSONE (DELTASONE) 10 MG tablet Take by mouth.     primidone (MYSOLINE) 50 MG tablet Take 1 tablet (50 mg total) by mouth daily. 90 tablet 1   Probiotic Product (PROBIOTIC DAILY PO) Take 1 capsule by mouth daily with lunch. Unknown strength      tacrolimus (PROTOPIC) 0.1 % ointment Apply 1 application topically 2 (two) times daily.     terbinafine (LAMISIL) 1 % cream Apply twice daily to lesions on the left leg until resolved.     gabapentin (NEURONTIN) 100 MG capsule Take 100 mg by mouth at bedtime.     No facility-administered medications prior to visit.   Allergies  Allergen Reactions   Exenatide Nausea And Vomiting   Meperidine And Related Other (See Comments)    Red streak, that happened once while having an IV   Nadolol Other (See Comments)    Numbness in fingers   Sitagliptin Phosphate [Sitagliptin] Other (See Comments)    Weight loss      Objective:   Today's Vitals  04/29/22 1000  BP: 122/70  Pulse: 68  Temp: (!) 96.9 F (36.1 C)  TempSrc: Temporal  SpO2: 99%  Weight: 137 lb 3.2 oz (62.2 kg)  Height: 6' (1.829 m)   Body mass index is 18.61 kg/m.   General: Well developed, well nourished. No acute distress. HEENT: The skin of the scalp is very thin, shiny, with pigmentary macules and superficial telangectasias.   There is a large region of superficial ulcerations, some of which have purulent exudates. External ears normal.   EAC and TMs normal bilaterally.  Psych: Alert and oriented. Normal mood and affect.  Health Maintenance Due  Topic Date Due   TETANUS/TDAP  04/25/2020   Zoster Vaccines- Shingrix (2 of 2) 01/10/2022     Assessment & Plan:   1. Essential hypertension Blood pressure is at goal. We will continue losartan 25 mg daily.  2. Type 2 diabetes mellitus with stage 3a chronic kidney disease, without long-term current use of insulin [facility]) Mr. Yankee notes he did a recent A1c as a mail-in test with his insurance. His results was 7.4%. This is about where he was in March as well. We will continue his current regimen of glimepiride 1 mg qam, empagliflozin 25 mg daily, and metformin 100 mg bid.  3. Erosive pustular dermatosis of scalp Continue to follow with dermatology.  4. Chronic head/  "scalp" pain I will try increasing his gabapentin to bid to see if this will provide better pain relief. We will titrate this up as needed until we have improved pain control.  - gabapentin (NEURONTIN) 100 MG capsule; Take 1 capsule (100 mg total) by mouth 2 (two) times daily.  Dispense: 180 capsule; Refill: 3  5. Squamous cell cancer of skin of nose Scheduled for Moh's surgery.  6. Weight loss The etiology of the weight loss is still uncertain. This could represent losing from weight gain brought on by frequent prednisone use. Screening labs at the last visit did not reveal other obvious sources. I would consider possible depression in light of his ongoing health issues and stressors. I will follow this for now.   Return in about 3 months (around 07/30/2022) for Reassessment.   Haydee Salter, MD

## 2022-04-30 DIAGNOSIS — L988 Other specified disorders of the skin and subcutaneous tissue: Secondary | ICD-10-CM | POA: Diagnosis not present

## 2022-05-01 ENCOUNTER — Ambulatory Visit (INDEPENDENT_AMBULATORY_CARE_PROVIDER_SITE_OTHER): Payer: Medicare Other

## 2022-05-01 DIAGNOSIS — Z0000 Encounter for general adult medical examination without abnormal findings: Secondary | ICD-10-CM | POA: Diagnosis not present

## 2022-05-01 NOTE — Progress Notes (Signed)
Subjective:   George Barnett is a 88 y.o. male who presents for an subsequent Medicare Annual Wellness Visit. I connected with George Barnett today by telephone and verified that I am speaking with the correct person using two identifiers. Location patient: [place] Location provider: work Persons participating in the virtual visit: patient, provider.   I discussed the limitations, risks, security and privacy concerns of performing an evaluation and management service by telephone and the availability of in person appointments. I also discussed with the patient that there may be a patient responsible charge related to this service. The patient expressed understanding and verbally consented to this telephonic visit.    Interactive audio and video telecommunications were attempted between this provider and patient, however failed, due to patient having technical difficulties OR patient did not have access to video capability.  We continued and completed visit with audio only.     Review of Systems     Cardiac Risk Factors include: advanced age (>85mn, >>23women);male gender     Objective:    Today's Vitals   There is no height or weight on file to calculate BMI.     05/01/2022    9:36 AM 05/01/2021    8:26 AM 05/18/2019    3:36 PM 05/14/2019    9:12 AM 04/29/2019   11:08 AM 08/22/2017    1:21 PM 07/04/2017    1:11 PM  Advanced Directives  Does Patient Have a Medical Advance Directive? Yes Yes Yes Yes No;Yes Yes No;Yes  Type of AParamedicof A[place]Living will HQ[place]Living will [place]appa[place]Living will HCopanLiving will  HFalling WaterLiving will Living will  Does patient want to make changes to medical advance directive?   No - Patient declined No - Patient declined No - Patient declined    Copy of H[place]in Chart? No - copy requested No - copy requested No -  copy requested   No - copy requested     Current Medications (verified) Outpatient Encounter Medications as of 05/01/2022  Medication Sig   acitretin (SORIATANE) 10 MG capsule    aspirin 81 MG tablet Take 81 mg by mouth every evening.    atorvastatin (LIPITOR) 10 MG tablet TAKE 1 TABLET EVERY NIGHT  BEFORE BED   cephALEXin (KEFLEX) 500 MG capsule Take 500 mg by mouth 2 (two) times daily.   clobetasol ointment (TEMOVATE) 0.05 % Apply topically.   Dapsone 5 % topical gel    folic acid (FOLVITE) 1 MG tablet Take 1 mg by mouth daily. except Mondays   furosemide (LASIX) 20 MG tablet Take 1 tablet (20 mg total) by mouth daily.   gabapentin (NEURONTIN) 100 MG capsule Take 1 capsule (100 mg total) by mouth 2 (two) times daily.   glimepiride (AMARYL) 1 MG tablet TAKE 1 TABLET DAILY WITH   BREAKFAST.   ipratropium (ATROVENT) 0.03 % nasal spray Instill 2 puffs in each nostril 2-3 times per day.   JARDIANCE 25 MG TABS tablet TAKE 1 TABLET DAILY BEFORE BREAKFAST   losartan (COZAAR) 25 MG tablet Take 1 tablet (25 mg total) by mouth daily.   metFORMIN (GLUCOPHAGE) 500 MG tablet Take 1,000 mg by mouth 2 (two) times daily with a meal. Take '1000mg'$  in am and '500mg'$  in pm   methotrexate (RHEUMATREX) 2.5 MG tablet Take 15 mg by mouth once a week.   Polyethyl Glycol-Propyl Glycol (SYSTANE OP) Place 1 drop into both  eyes daily. Unknown stregth   polyethylene glycol (MIRALAX / GLYCOLAX) packet Take 17 g by mouth daily as needed for mild constipation.    predniSONE (DELTASONE) 10 MG tablet Take by mouth.   primidone (MYSOLINE) 50 MG tablet Take 1 tablet (50 mg total) by mouth daily.   Probiotic Product (PROBIOTIC DAILY PO) Take 1 capsule by mouth daily with lunch. Unknown strength   tacrolimus (PROTOPIC) 0.1 % ointment Apply 1 application topically 2 (two) times daily.   terbinafine (LAMISIL) 1 % cream Apply twice daily to lesions on the left leg until resolved.   No facility-administered encounter medications on  file as of 05/01/2022.    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[place]) 01/12/2020   CAD (coronary artery disease) 03/25/2017   Cancer [facility])    SKIN CANCER    Cataract    Chronic head/ "scalp" pain 01/12/2020   Per patient the pain of his scalp is like "bees stinging you all over on your head "he says the slightest touch to the area causes significant pain/discomfort   Costochondritis 05/05/2017   --->pt had mentioned costochondritis at his last office visit with his former PCP prior to transferring care to [place]:  George Lister, MD - 02/24/2017 10:30 AM EDT Formatting of this note may be different from the original. George Barnett is a 88 y.o. male who is here for a follow up visit.  DM: He is taking metformin and glimepiride and tolerates these well. His A1C was 7.1 on recent labs.  HTN: He   Decreased activity 12/08/2019   Dehydration- mild 12/08/2019   Diabetes mellitus without complication (HCC)    Edema, peripheral 11/22/2014   Elbow effusion, left    Erosive pustular dermatosis of scalp 6/56/8127   Folliculitis- scalp; txed by Derm- Dr George Barnett 07/27/2018   Frequency of urination    GAD (generalized anxiety disorder) 12/10/2019   GERD (gastroesophageal reflux disease)    Glossodynia 05/05/2017   George Pique, MD - 11/22/2016 2:15 PM EST Formatting of this note may be different from the original. Otolaryngology Initial Consultation Note  Requesting Attending Physician: [name], MD Service Requesting Consult: No service for patient encounter.  History of Present Illness:   The patient is being seen in consultation  with [name], MD for the evaluation of tongu   Heart attack [facility])    Heart disease    High risk medication use 01/15/2019   Hypertension    Hypertension associated with diabetes ([place]) 08/05/2017   Microalbuminuria due to type 2 diabetes mellitus ([place]) 08/05/2017   Mixed diabetic hyperlipidemia associated with type 2 diabetes mellitus ([place]) 08/05/2017   Orthostatic hypotension 11/05/2018   Primary osteoarthritis of right shoulder 01/24/2015   Prostate enlargement    Recurrent right inguinal hernia 05/18/2019   RLS (restless legs syndrome) 06/17/2017   Snoring 03/25/2017   Stage 3 chronic kidney disease ([place]) 04/22/2017   Stage 4 chronic kidney disease ([place]) 09/08/2020   Stress and adjustment reaction 01/12/2020   Tremor of both hands-  both wiht rest and activity; acutely worse 11/27/2018   Tremors of nervous system    Type 2 diabetes mellitus with stage 3  chronic kidney disease, without long-term current use of insulin ([place]) 05/05/2017   Urinary retention due to benign prostatic hyperplasia 01/25/2015   Venous insufficiency of both lower extremities 11/27/2018   White coat syndrome with diagnosis of hypertension 03/10/2020   Past Surgical History:  Procedure Laterality Date   CARDIAC CATHETERIZATION     1999   cardio stint     HERNIA REPAIR     BILAT    INGUINAL HERNIA REPAIR  05/18/2019   INGUINAL HERNIA REPAIR Right 05/18/2019   Procedure: REPAIR RIGHT INGUINAL HERNIA WITH MESH;  Surgeon: Erroll Luna, MD;  Location: [facility];  Service: General;  Laterality: Right;   MOHS SURGERY     BILAT EARS    PROSTATE BIOPSY     TOTAL SHOULDER ARTHROPLASTY Right 01/24/2015   Procedure: RIGHT TOTAL SHOULDER ARTHROPLASTY;  Surgeon: Marchia Bond, MD;  Location: [facility];  Service: Orthopedics;  Laterality: Right;   Family History  Problem Relation Age of Onset   Hypertension Brother    Social History   Socioeconomic History   Marital status: Married    Spouse name: Not on file   Number  of children: Not on file   Years of education: Not on file   Highest education level: Not on file  Occupational History   Not on file  Tobacco Use   Smoking status: Never   Smokeless tobacco: Never  Vaping Use   Vaping Use: Never used  Substance and Sexual Activity   Alcohol use: No   Drug use: No   Sexual activity: Not on file  Other Topics Concern   Not on file  Social History Narrative   Not on file   Social Determinants of Health   Financial Resource Strain: Low Risk  (05/01/2022)   Overall Financial Resource Strain (CARDIA)    Difficulty of Paying Living Expenses: Not hard at all  Food Insecurity: No Food Insecurity (05/01/2022)   Hunger Vital Sign    Worried About Running Out of Food in the Last Year: Never true    [place] in the Last Year: Never true  Transportation Needs: No Transportation Needs (05/01/2022)   PRAPARE - Hydrologist (Medical): No    Lack of Transportation (Non-Medical): No  Physical Activity: Inactive (05/01/2022)   Exercise Vital Sign    Days of Exercise per Week: 0 days    Minutes of Exercise per Session: 0 min  Stress: No Stress Concern Present (05/01/2022)   [place]    Feeling of Stress : Not at all  Social Connections: Moderately Isolated (05/01/2022)   Social Connection and Isolation Panel [NHANES]    Frequency of Communication with Friends and Family: Three times a week    Frequency of Social Gatherings with Friends and Family: Three times a week    Attends Religious Services: Never    Active Member of Clubs or Organizations: No    Attends Music therapist: Never    Marital Status: Married    Tobacco Counseling Counseling given: Not Answered   Clinical Intake:  Pre-visit preparation completed: Yes  Pain : No/denies pain     Nutritional Risks: None Diabetes: Yes CBG done?: No Did pt. bring in CBG monitor from  [place]?: No  How often do you need to have someone help you when you read instructions, pamphlets, or other written materials from your doctor or pharmacy?: 1 - Never What is the  last grade level you completed in school?: college  Diabetic?yes Nutrition Risk Assessment:  Has the patient had any N/V/D within the last 2 months?  No  Does the patient have any non-healing wounds?  No  Has the patient had any unintentional weight loss or weight gain?  No   Diabetes:  Is the patient diabetic?  Yes  If diabetic, was a CBG obtained today?  No  Did the patient bring in their glucometer from [place]?  No  How often do you monitor your CBG's? 3 x day .   Financial Strains and Diabetes Management:  Are you having any financial strains with the device, your supplies or your medication? No .  Does the patient want to be seen by Chronic Care Management for management of their diabetes?  No  Would the patient like to be referred to a Nutritionist or for Diabetic Management?  No   Diabetic Exams:  Diabetic Eye Exam: Completed 11/2021 Diabetic Foot Exam: Overdue, Pt has been advised about the importance in completing this exam. Pt is scheduled for diabetic foot exam on next office visit .   Interpreter Needed?: No  Information entered by :: [email or web address]   Activities of Daily Living    05/01/2022    9:38 AM  In your present state of health, do you have any difficulty performing the following activities:  Hearing? 0  Vision? 0  Difficulty concentrating or making decisions? 0  Walking or climbing stairs? 0  Dressing or bathing? 0  Doing errands, shopping? 0  Preparing Food and eating ? N  Using the Toilet? N  In the past six months, have you accidently leaked urine? N  Do you have problems with loss of bowel control? N  Managing your Medications? N  Managing your Finances? N  Housekeeping or managing your Housekeeping? N    Patient Care Team: Haydee Salter, MD as PCP - General (Family  Medicine) Jerrell Belfast, MD as Consulting Physician (Otolaryngology) Franchot Gallo, MD as Consulting Physician (Urology) Jamse Arn, MD as Consulting Physician (Physical Medicine and Rehabilitation) Deneise Lever, MD as Consulting Physician (Pulmonary Disease) Park Liter, MD as Consulting Physician (Cardiology) Garry Heater, MD as Referring Physician (Dermatology) Star Age, MD as Attending Physician (Neurology) Marygrace Drought, MD as Consulting Physician (Ophthalmology) Thersa Salt as Consulting Physician (Dermatology)  Indicate any recent Medical Services you may have received from other than [facility] providers in the past year (date may be approximate).     Assessment:   This is a routine wellness examination for [place].  Hearing/Vision screen Vision Screening - Comments:: Annual eye exam wears glasses   Dietary issues and exercise activities discussed: Current Exercise Habits: The patient does not participate in regular exercise at present, Exercise limited by: None identified   Goals Addressed             This Visit's Progress    Increase physical activity   On track      Depression Screen    05/01/2022    9:36 AM 05/01/2021    8:32 AM 01/08/2021    1:44 PM 09/06/2020   10:38 AM 06/09/2020   11:21 AM 03/10/2020   10:36 AM 12/08/2019   10:40 AM  PHQ 2/9 Scores  PHQ - 2 Score 0 0 1 0 0 1 0  PHQ- 9 Score    0 0 1 0    Fall Risk    05/01/2022    9:37 AM 05/01/2021  8:31 AM 01/08/2021    1:44 PM 09/06/2020   10:37 AM 06/09/2020   11:20 AM  Fall Risk   Falls in the past year? 0 0 0 0 0  Number falls in past yr: 0 0     Injury with Fall? 0 0     Risk for fall due to :    No Fall Risks No Fall Risks  Follow up Falls evaluation completed;Education provided Falls evaluation completed;Falls prevention discussed  Falls evaluation completed Falls evaluation completed    FALL RISK PREVENTION PERTAINING TO THE [place]:  Any stairs  in or around the [place]? No  If so, are there any without handrails? No  [place] free of loose throw rugs in walkways, pet beds, electrical cords, etc? Yes  Adequate lighting in your [place] to reduce risk of falls? Yes   ASSISTIVE DEVICES UTILIZED TO PREVENT FALLS:  Life alert? No  Use of a cane, walker or w/c? No  Grab bars in the bathroom? Yes  Shower chair or bench in shower? Yes  Elevated toilet seat or a handicapped toilet? Yes   Cognitive Function:  Normal cognitive status assessed by telephone conversation by this Nurse Health Advisor. No abnormalities found.        11/25/2019    2:08 PM  6CIT Screen  What Year? 0 points  What month? 0 points  What time? 0 points  Count back from 20 0 points  Months in reverse 0 points  Repeat phrase 0 points  Total Score 0 points    Immunizations Immunization History  Administered Date(s) Administered   Hepatitis A 01/23/2003, 02/09/2004   Influenza Nasal 08/15/2018   Influenza-Unspecified 08/22/2014, 09/17/2015, 08/26/2016, 08/28/2017, 08/19/2019, 08/24/2021   PFIZER Comirnaty(Gray Top)Covid-19 Tri-Sucrose Vaccine 03/01/2021   PFIZER(Purple Top)SARS-COV-2 Vaccination 12/01/2019, 12/21/2019, 08/25/2020, 03/01/2021   Pfizer Covid-19 Vaccine Bivalent Booster 33yr & up 08/02/2021   Pneumococcal Conjugate-13 03/25/2017   Pneumococcal Polysaccharide-23 02/16/2013   Tdap 04/25/2010   Zoster Recombinat (Shingrix) 11/15/2021   Zoster, Live 01/03/2014, 08/18/2014    TDAP status: Due, Education has been provided regarding the importance of this vaccine. Advised may receive this vaccine at local pharmacy or Health Dept. Aware to provide a copy of the vaccination record if obtained from local pharmacy or Health Dept. Verbalized acceptance and understanding.  Flu Vaccine status: Up to date  Pneumococcal vaccine status: Up to date  Covid-19 vaccine status: Completed vaccines  Qualifies for Shingles Vaccine? Yes   Zostavax completed Yes    Shingrix Completed?: Yes  Screening Tests Health Maintenance  Topic Date Due   TETANUS/TDAP  04/25/2020   Zoster Vaccines- Shingrix (2 of 2) 01/10/2022   INFLUENZA VACCINE  06/18/2022   FOOT EXAM  07/18/2022   HEMOGLOBIN A1C  07/20/2022   OPHTHALMOLOGY EXAM  12/17/2022   Pneumonia Vaccine 88 Years old  Completed   COVID-19 Vaccine  Completed   HPV VACCINES  Aged Out    Health Maintenance  Health Maintenance Due  Topic Date Due   TETANUS/TDAP  04/25/2020   Zoster Vaccines- Shingrix (2 of 2) 01/10/2022    Colorectal cancer screening: No longer required.   Lung Cancer Screening: (Low Dose CT Chest recommended if Age 86-80years, 30 pack-year currently smoking OR have quit w/in 15years.) does not qualify.   Lung Cancer Screening Referral: n/a  Additional Screening:  Hepatitis C Screening: does not qualify;   Vision Screening: Recommended annual ophthalmology exams for early detection of glaucoma and other disorders of the eye. Is  the patient up to date with their annual eye exam?  Yes  Who is the provider or what is the name of the office in which the patient attends annual eye exams? Dr.McQuen  If pt is not established with a provider, would they like to be referred to a provider to establish care? No .   Dental Screening: Recommended annual dental exams for proper oral hygiene  Community Resource Referral / Chronic Care Management: CRR required this visit?  No   CCM required this visit?  No      Plan:     I have personally reviewed and noted the following in the patient's chart:   Medical and social history Use of alcohol, tobacco or illicit drugs  Current medications and supplements including opioid prescriptions. Patient is not currently taking opioid prescriptions. Functional ability and status Nutritional status Physical activity Advanced directives List of other physicians Hospitalizations, surgeries, and ER visits in previous 12  months Vitals Screenings to include cognitive, depression, and falls Referrals and appointments  In addition, I have reviewed and discussed with patient certain preventive protocols, quality metrics, and best practice recommendations. A written personalized care plan for preventive services as well as general preventive health recommendations were provided to patient.     Randel Pigg, LPN   3/78/5885   Nurse Notes: none

## 2022-05-01 NOTE — Patient Instructions (Signed)
George Barnett , Thank you for taking time to come for your Medicare Wellness Visit. I appreciate your ongoing commitment to your health goals. Please review the following plan we discussed and let me know if I can assist you in the future.   Screening recommendations/referrals: Colonoscopy: no longer required  Recommended yearly ophthalmology/optometry visit for glaucoma screening and checkup Recommended yearly dental visit for hygiene and checkup  Vaccinations: Influenza vaccine: completed  Pneumococcal vaccine: completed  Tdap vaccine: due  Shingles vaccine: completed     Advanced directives: yes  Conditions/risks identified: none   Next appointment: none  Preventive Care 86 Years and Older, Male Preventive care refers to lifestyle choices and visits with your health care provider that can promote health and wellness. What does preventive care include? A yearly physical exam. This is also called an annual well check. Dental exams once or twice a year. Routine eye exams. Ask your health care provider how often you should have your eyes checked. Personal lifestyle choices, including: Daily care of your teeth and gums. Regular physical activity. Eating a healthy diet. Avoiding tobacco and drug use. Limiting alcohol use. Practicing safe sex. Taking low doses of aspirin every day. Taking vitamin and mineral supplements as recommended by your health care provider. What happens during an annual well check? The services and screenings done by your health care provider during your annual well check will depend on your age, overall health, lifestyle risk factors, and family history of disease. Counseling  Your health care provider may ask you questions about your: Alcohol use. Tobacco use. Drug use. Emotional well-being. Home and relationship well-being. Sexual activity. Eating habits. History of falls. Memory and ability to understand (cognition). Work and work  Statistician. Screening  You may have the following tests or measurements: Height, weight, and BMI. Blood pressure. Lipid and cholesterol levels. These may be checked every 5 years, or more frequently if you are over 76 years old. Skin check. Lung cancer screening. You may have this screening every year starting at age 57 if you have a 30-pack-year history of smoking and currently smoke or have quit within the past 15 years. Fecal occult blood test (FOBT) of the stool. You may have this test every year starting at age 48. Flexible sigmoidoscopy or colonoscopy. You may have a sigmoidoscopy every 5 years or a colonoscopy every 10 years starting at age 29. Prostate cancer screening. Recommendations will vary depending on your family history and other risks. Hepatitis C blood test. Hepatitis B blood test. Sexually transmitted disease (STD) testing. Diabetes screening. This is done by checking your blood sugar (glucose) after you have not eaten for a while (fasting). You may have this done every 1-3 years. Abdominal aortic aneurysm (AAA) screening. You may need this if you are a current or former smoker. Osteoporosis. You may be screened starting at age 96 if you are at high risk. Talk with your health care provider about your test results, treatment options, and if necessary, the need for more tests. Vaccines  Your health care provider may recommend certain vaccines, such as: Influenza vaccine. This is recommended every year. Tetanus, diphtheria, and acellular pertussis (Tdap, Td) vaccine. You may need a Td booster every 10 years. Zoster vaccine. You may need this after age 45. Pneumococcal 13-valent conjugate (PCV13) vaccine. One dose is recommended after age 78. Pneumococcal polysaccharide (PPSV23) vaccine. One dose is recommended after age 25. Talk to your health care provider about which screenings and vaccines you need and how often  you need them. This information is not intended to replace  advice given to you by your health care provider. Make sure you discuss any questions you have with your health care provider. Document Released: 12/01/2015 Document Revised: 07/24/2016 Document Reviewed: 09/05/2015 Elsevier Interactive Patient Education  2017 [place] Prevention in the Home Falls can cause injuries. They can happen to people of all ages. There are many things you can do to make your home safe and to help prevent falls. What can I do on the outside of my home? Regularly fix the edges of walkways and driveways and fix any cracks. Remove anything that might make you trip as you walk through a door, such as a raised step or threshold. Trim any bushes or trees on the path to your home. Use bright outdoor lighting. Clear any walking paths of anything that might make someone trip, such as rocks or tools. Regularly check to see if handrails are loose or broken. Make sure that both sides of any steps have handrails. Any raised decks and porches should have guardrails on the edges. Have any leaves, snow, or ice cleared regularly. Use sand or salt on walking paths during winter. Clean up any spills in your garage right away. This includes oil or grease spills. What can I do in the bathroom? Use night lights. Install grab bars by the toilet and in the tub and shower. Do not use towel bars as grab bars. Use non-skid mats or decals in the tub or shower. If you need to sit down in the shower, use a plastic, non-slip stool. Keep the floor dry. Clean up any water that spills on the floor as soon as it happens. Remove soap buildup in the tub or shower regularly. Attach bath mats securely with double-sided non-slip rug tape. Do not have throw rugs and other things on the floor that can make you trip. What can I do in the bedroom? Use night lights. Make sure that you have a light by your bed that is easy to reach. Do not use any sheets or blankets that are too big for your bed.  They should not hang down onto the floor. Have a firm chair that has side arms. You can use this for support while you get dressed. Do not have throw rugs and other things on the floor that can make you trip. What can I do in the kitchen? Clean up any spills right away. Avoid walking on wet floors. Keep items that you use a lot in easy-to-reach places. If you need to reach something above you, use a strong step stool that has a grab bar. Keep electrical cords out of the way. Do not use floor polish or wax that makes floors slippery. If you must use wax, use non-skid floor wax. Do not have throw rugs and other things on the floor that can make you trip. What can I do with my stairs? Do not leave any items on the stairs. Make sure that there are handrails on both sides of the stairs and use them. Fix handrails that are broken or loose. Make sure that handrails are as long as the stairways. Check any carpeting to make sure that it is firmly attached to the stairs. Fix any carpet that is loose or worn. Avoid having throw rugs at the top or bottom of the stairs. If you do have throw rugs, attach them to the floor with carpet tape. Make sure that you have a light  switch at the top of the stairs and the bottom of the stairs. If you do not have them, ask someone to add them for you. What else can I do to help prevent falls? Wear shoes that: Do not have high heels. Have rubber bottoms. Are comfortable and fit you well. Are closed at the toe. Do not wear sandals. If you use a stepladder: Make sure that it is fully opened. Do not climb a closed stepladder. Make sure that both sides of the stepladder are locked into place. Ask someone to hold it for you, if possible. Clearly mark and make sure that you can see: Any grab bars or handrails. First and last steps. Where the edge of each step is. Use tools that help you move around (mobility aids) if they are needed. These  include: Canes. Walkers. Scooters. Crutches. Turn on the lights when you go into a dark area. Replace any light bulbs as soon as they burn out. Set up your furniture so you have a clear path. Avoid moving your furniture around. If any of your floors are uneven, fix them. If there are any pets around you, be aware of where they are. Review your medicines with your doctor. Some medicines can make you feel dizzy. This can increase your chance of falling. Ask your doctor what other things that you can do to help prevent falls. This information is not intended to replace advice given to you by your health care provider. Make sure you discuss any questions you have with your health care provider. Document Released: 08/31/2009 Document Revised: 04/11/2016 Document Reviewed: 12/09/2014 Elsevier Interactive Patient Education  2017 [employer].

## 2022-05-02 ENCOUNTER — Ambulatory Visit: Payer: Medicare Other

## 2022-05-02 DIAGNOSIS — C44321 Squamous cell carcinoma of skin of nose: Secondary | ICD-10-CM | POA: Diagnosis not present

## 2022-05-04 ENCOUNTER — Other Ambulatory Visit: Payer: Self-pay | Admitting: Family Medicine

## 2022-05-04 DIAGNOSIS — I152 Hypertension secondary to endocrine disorders: Secondary | ICD-10-CM

## 2022-05-10 DIAGNOSIS — B354 Tinea corporis: Secondary | ICD-10-CM | POA: Diagnosis not present

## 2022-05-10 DIAGNOSIS — L988 Other specified disorders of the skin and subcutaneous tissue: Secondary | ICD-10-CM | POA: Diagnosis not present

## 2022-05-10 DIAGNOSIS — Z79899 Other long term (current) drug therapy: Secondary | ICD-10-CM | POA: Diagnosis not present

## 2022-05-10 DIAGNOSIS — Z862 Personal history of diseases of the blood and blood-forming organs and certain disorders involving the immune mechanism: Secondary | ICD-10-CM | POA: Diagnosis not present

## 2022-05-10 DIAGNOSIS — Z79621 Long term (current) use of calcineurin inhibitor: Secondary | ICD-10-CM | POA: Diagnosis not present

## 2022-05-10 DIAGNOSIS — Z5181 Encounter for therapeutic drug level monitoring: Secondary | ICD-10-CM | POA: Diagnosis not present

## 2022-05-12 ENCOUNTER — Inpatient Hospital Stay (HOSPITAL_COMMUNITY)
Admission: EM | Admit: 2022-05-12 | Discharge: 2022-05-20 | DRG: 193 | Disposition: A | Discharge status: Skilled Nursing Facility | Payer: Medicare Other | Attending: Internal Medicine | Admitting: Internal Medicine

## 2022-05-12 ENCOUNTER — Emergency Department (HOSPITAL_COMMUNITY): Payer: Medicare Other

## 2022-05-12 ENCOUNTER — Encounter (HOSPITAL_COMMUNITY): Payer: Self-pay

## 2022-05-12 ENCOUNTER — Other Ambulatory Visit: Payer: Self-pay | Admitting: Family Medicine

## 2022-05-12 ENCOUNTER — Other Ambulatory Visit: Payer: Self-pay

## 2022-05-12 DIAGNOSIS — L03811 Cellulitis of head [any part, except face]: Secondary | ICD-10-CM | POA: Diagnosis present

## 2022-05-12 DIAGNOSIS — R3915 Urgency of urination: Secondary | ICD-10-CM | POA: Diagnosis present

## 2022-05-12 DIAGNOSIS — E1169 Type 2 diabetes mellitus with other specified complication: Secondary | ICD-10-CM | POA: Diagnosis present

## 2022-05-12 DIAGNOSIS — Z85828 Personal history of other malignant neoplasm of skin: Secondary | ICD-10-CM

## 2022-05-12 DIAGNOSIS — R531 Weakness: Secondary | ICD-10-CM | POA: Diagnosis not present

## 2022-05-12 DIAGNOSIS — N401 Enlarged prostate with lower urinary tract symptoms: Secondary | ICD-10-CM | POA: Diagnosis present

## 2022-05-12 DIAGNOSIS — Z79899 Other long term (current) drug therapy: Secondary | ICD-10-CM

## 2022-05-12 DIAGNOSIS — F411 Generalized anxiety disorder: Secondary | ICD-10-CM | POA: Diagnosis present

## 2022-05-12 DIAGNOSIS — L988 Other specified disorders of the skin and subcutaneous tissue: Secondary | ICD-10-CM | POA: Diagnosis not present

## 2022-05-12 DIAGNOSIS — N184 Chronic kidney disease, stage 4 (severe): Secondary | ICD-10-CM | POA: Diagnosis present

## 2022-05-12 DIAGNOSIS — I959 Hypotension, unspecified: Secondary | ICD-10-CM | POA: Diagnosis not present

## 2022-05-12 DIAGNOSIS — R339 Retention of urine, unspecified: Secondary | ICD-10-CM | POA: Diagnosis present

## 2022-05-12 DIAGNOSIS — G2581 Restless legs syndrome: Secondary | ICD-10-CM | POA: Diagnosis present

## 2022-05-12 DIAGNOSIS — Z7984 Long term (current) use of oral hypoglycemic drugs: Secondary | ICD-10-CM | POA: Diagnosis not present

## 2022-05-12 DIAGNOSIS — J690 Pneumonitis due to inhalation of food and vomit: Secondary | ICD-10-CM | POA: Diagnosis not present

## 2022-05-12 DIAGNOSIS — I2510 Atherosclerotic heart disease of native coronary artery without angina pectoris: Secondary | ICD-10-CM | POA: Diagnosis not present

## 2022-05-12 DIAGNOSIS — C4490 Unspecified malignant neoplasm of skin, unspecified: Secondary | ICD-10-CM | POA: Diagnosis present

## 2022-05-12 DIAGNOSIS — Z7189 Other specified counseling: Secondary | ICD-10-CM

## 2022-05-12 DIAGNOSIS — R509 Fever, unspecified: Secondary | ICD-10-CM | POA: Diagnosis not present

## 2022-05-12 DIAGNOSIS — M47816 Spondylosis without myelopathy or radiculopathy, lumbar region: Secondary | ICD-10-CM | POA: Diagnosis not present

## 2022-05-12 DIAGNOSIS — Z888 Allergy status to other drugs, medicaments and biological substances status: Secondary | ICD-10-CM

## 2022-05-12 DIAGNOSIS — Z20822 Contact with and (suspected) exposure to covid-19: Secondary | ICD-10-CM | POA: Diagnosis present

## 2022-05-12 DIAGNOSIS — J9601 Acute respiratory failure with hypoxia: Secondary | ICD-10-CM | POA: Diagnosis not present

## 2022-05-12 DIAGNOSIS — J189 Pneumonia, unspecified organism: Principal | ICD-10-CM | POA: Diagnosis present

## 2022-05-12 DIAGNOSIS — N179 Acute kidney failure, unspecified: Secondary | ICD-10-CM | POA: Diagnosis not present

## 2022-05-12 DIAGNOSIS — C44321 Squamous cell carcinoma of skin of nose: Secondary | ICD-10-CM | POA: Diagnosis not present

## 2022-05-12 DIAGNOSIS — A419 Sepsis, unspecified organism: Secondary | ICD-10-CM | POA: Diagnosis not present

## 2022-05-12 DIAGNOSIS — L89321 Pressure ulcer of left buttock, stage 1: Secondary | ICD-10-CM | POA: Diagnosis present

## 2022-05-12 DIAGNOSIS — I129 Hypertensive chronic kidney disease with stage 1 through stage 4 chronic kidney disease, or unspecified chronic kidney disease: Secondary | ICD-10-CM | POA: Diagnosis present

## 2022-05-12 DIAGNOSIS — Z9981 Dependence on supplemental oxygen: Secondary | ICD-10-CM | POA: Diagnosis not present

## 2022-05-12 DIAGNOSIS — J9690 Respiratory failure, unspecified, unspecified whether with hypoxia or hypercapnia: Secondary | ICD-10-CM | POA: Diagnosis not present

## 2022-05-12 DIAGNOSIS — Z8249 Family history of ischemic heart disease and other diseases of the circulatory system: Secondary | ICD-10-CM

## 2022-05-12 DIAGNOSIS — R29898 Other symptoms and signs involving the musculoskeletal system: Secondary | ICD-10-CM | POA: Diagnosis present

## 2022-05-12 DIAGNOSIS — E871 Hypo-osmolality and hyponatremia: Secondary | ICD-10-CM | POA: Diagnosis not present

## 2022-05-12 DIAGNOSIS — M48061 Spinal stenosis, lumbar region without neurogenic claudication: Secondary | ICD-10-CM | POA: Diagnosis not present

## 2022-05-12 DIAGNOSIS — R270 Ataxia, unspecified: Secondary | ICD-10-CM | POA: Diagnosis not present

## 2022-05-12 DIAGNOSIS — C44529 Squamous cell carcinoma of skin of other part of trunk: Secondary | ICD-10-CM | POA: Diagnosis not present

## 2022-05-12 DIAGNOSIS — R0602 Shortness of breath: Secondary | ICD-10-CM | POA: Diagnosis not present

## 2022-05-12 DIAGNOSIS — D539 Nutritional anemia, unspecified: Secondary | ICD-10-CM | POA: Diagnosis present

## 2022-05-12 DIAGNOSIS — L89311 Pressure ulcer of right buttock, stage 1: Secondary | ICD-10-CM | POA: Diagnosis present

## 2022-05-12 DIAGNOSIS — R9431 Abnormal electrocardiogram [ECG] [EKG]: Secondary | ICD-10-CM | POA: Diagnosis not present

## 2022-05-12 DIAGNOSIS — R350 Frequency of micturition: Secondary | ICD-10-CM | POA: Diagnosis present

## 2022-05-12 DIAGNOSIS — D509 Iron deficiency anemia, unspecified: Secondary | ICD-10-CM | POA: Diagnosis present

## 2022-05-12 DIAGNOSIS — N1831 Chronic kidney disease, stage 3a: Secondary | ICD-10-CM | POA: Diagnosis present

## 2022-05-12 DIAGNOSIS — R251 Tremor, unspecified: Secondary | ICD-10-CM | POA: Diagnosis present

## 2022-05-12 DIAGNOSIS — Z96611 Presence of right artificial shoulder joint: Secondary | ICD-10-CM | POA: Diagnosis present

## 2022-05-12 DIAGNOSIS — Z515 Encounter for palliative care: Secondary | ICD-10-CM | POA: Diagnosis not present

## 2022-05-12 DIAGNOSIS — E1122 Type 2 diabetes mellitus with diabetic chronic kidney disease: Secondary | ICD-10-CM | POA: Diagnosis present

## 2022-05-12 DIAGNOSIS — J90 Pleural effusion, not elsewhere classified: Secondary | ICD-10-CM | POA: Diagnosis not present

## 2022-05-12 DIAGNOSIS — N183 Chronic kidney disease, stage 3 unspecified: Secondary | ICD-10-CM | POA: Diagnosis present

## 2022-05-12 DIAGNOSIS — E782 Mixed hyperlipidemia: Secondary | ICD-10-CM | POA: Diagnosis present

## 2022-05-12 DIAGNOSIS — E119 Type 2 diabetes mellitus without complications: Secondary | ICD-10-CM | POA: Diagnosis not present

## 2022-05-12 DIAGNOSIS — Z66 Do not resuscitate: Secondary | ICD-10-CM | POA: Diagnosis present

## 2022-05-12 DIAGNOSIS — I252 Old myocardial infarction: Secondary | ICD-10-CM

## 2022-05-12 DIAGNOSIS — G834 Cauda equina syndrome: Secondary | ICD-10-CM | POA: Diagnosis present

## 2022-05-12 DIAGNOSIS — G250 Essential tremor: Secondary | ICD-10-CM | POA: Diagnosis present

## 2022-05-12 DIAGNOSIS — R21 Rash and other nonspecific skin eruption: Secondary | ICD-10-CM | POA: Diagnosis present

## 2022-05-12 DIAGNOSIS — L8990 Pressure ulcer of unspecified site, unspecified stage: Secondary | ICD-10-CM | POA: Insufficient documentation

## 2022-05-12 DIAGNOSIS — M792 Neuralgia and neuritis, unspecified: Secondary | ICD-10-CM | POA: Diagnosis not present

## 2022-05-12 DIAGNOSIS — M4804 Spinal stenosis, thoracic region: Secondary | ICD-10-CM | POA: Diagnosis not present

## 2022-05-12 DIAGNOSIS — J9600 Acute respiratory failure, unspecified whether with hypoxia or hypercapnia: Secondary | ICD-10-CM | POA: Diagnosis not present

## 2022-05-12 DIAGNOSIS — R1314 Dysphagia, pharyngoesophageal phase: Secondary | ICD-10-CM | POA: Diagnosis not present

## 2022-05-12 DIAGNOSIS — K219 Gastro-esophageal reflux disease without esophagitis: Secondary | ICD-10-CM | POA: Diagnosis present

## 2022-05-12 DIAGNOSIS — Z7401 Bed confinement status: Secondary | ICD-10-CM | POA: Diagnosis not present

## 2022-05-12 DIAGNOSIS — R1310 Dysphagia, unspecified: Secondary | ICD-10-CM | POA: Diagnosis not present

## 2022-05-12 DIAGNOSIS — I509 Heart failure, unspecified: Secondary | ICD-10-CM | POA: Diagnosis not present

## 2022-05-12 LAB — COMPREHENSIVE METABOLIC PANEL
ALT: 14 U/L (ref 0–44)
AST: 16 U/L (ref 15–41)
Albumin: 3.1 g/dL — ABNORMAL LOW (ref 3.5–5.0)
Alkaline Phosphatase: 39 U/L (ref 38–126)
Anion gap: 9 (ref 5–15)
BUN: 41 mg/dL — ABNORMAL HIGH (ref 8–23)
CO2: 27 mmol/L (ref 22–32)
Calcium: 9.5 mg/dL (ref 8.9–10.3)
Chloride: 102 mmol/L (ref 98–111)
Creatinine, Ser: 1.64 mg/dL — ABNORMAL HIGH (ref 0.61–1.24)
GFR, Estimated: 40 mL/min — ABNORMAL LOW (ref >60)
Glucose, Bld: 159 mg/dL — ABNORMAL HIGH (ref 70–99)
Potassium: 4.4 mmol/L (ref 3.5–5.1)
Sodium: 138 mmol/L (ref 135–145)
Total Bilirubin: 0.7 mg/dL (ref 0.3–1.2)
Total Protein: 6 g/dL — ABNORMAL LOW (ref 6.5–8.1)

## 2022-05-12 LAB — CBC WITH DIFFERENTIAL/PLATELET
Abs Immature Granulocytes: 0.03 10*3/uL (ref 0.00–0.07)
Basophils Absolute: 0.1 10*3/uL (ref 0.0–0.1)
Basophils Relative: 1 %
Eosinophils Absolute: 0.6 10*3/uL — ABNORMAL HIGH (ref 0.0–0.5)
Eosinophils Relative: 8 %
HCT: 32.6 % — ABNORMAL LOW (ref 39.0–52.0)
Hemoglobin: 10.8 g/dL — ABNORMAL LOW (ref 13.0–17.0)
Immature Granulocytes: 0 %
Lymphocytes Relative: 12 %
Lymphs Abs: 0.9 10*3/uL (ref 0.7–4.0)
MCH: 34.4 pg — ABNORMAL HIGH (ref 26.0–34.0)
MCHC: 33.1 g/dL (ref 30.0–36.0)
MCV: 103.8 fL — ABNORMAL HIGH (ref 80.0–100.0)
Monocytes Absolute: 0.5 10*3/uL (ref 0.1–1.0)
Monocytes Relative: 7 %
Neutro Abs: 5.4 10*3/uL (ref 1.7–7.7)
Neutrophils Relative %: 72 %
Platelets: 157 10*3/uL (ref 150–400)
RBC: 3.14 MIL/uL — ABNORMAL LOW (ref 4.22–5.81)
RDW: 13.3 % (ref 11.5–15.5)
WBC: 7.4 10*3/uL (ref 4.0–10.5)
nRBC: 0 % (ref 0.0–0.2)

## 2022-05-12 LAB — APTT: aPTT: 25 seconds (ref 24–36)

## 2022-05-12 LAB — CBG MONITORING, ED: Glucose-Capillary: 164 mg/dL — ABNORMAL HIGH (ref 70–99)

## 2022-05-12 LAB — PROTIME-INR
INR: 1.1 (ref 0.8–1.2)
Prothrombin Time: 13.6 seconds (ref 11.4–15.2)

## 2022-05-13 ENCOUNTER — Encounter (HOSPITAL_COMMUNITY): Payer: Self-pay | Admitting: Internal Medicine

## 2022-05-13 ENCOUNTER — Observation Stay (HOSPITAL_COMMUNITY): Payer: Medicare Other

## 2022-05-13 DIAGNOSIS — R531 Weakness: Secondary | ICD-10-CM | POA: Diagnosis not present

## 2022-05-13 DIAGNOSIS — J189 Pneumonia, unspecified organism: Secondary | ICD-10-CM | POA: Diagnosis not present

## 2022-05-13 DIAGNOSIS — J90 Pleural effusion, not elsewhere classified: Secondary | ICD-10-CM | POA: Diagnosis not present

## 2022-05-13 DIAGNOSIS — R29898 Other symptoms and signs involving the musculoskeletal system: Secondary | ICD-10-CM | POA: Diagnosis not present

## 2022-05-13 DIAGNOSIS — M48061 Spinal stenosis, lumbar region without neurogenic claudication: Secondary | ICD-10-CM | POA: Diagnosis not present

## 2022-05-13 DIAGNOSIS — M4804 Spinal stenosis, thoracic region: Secondary | ICD-10-CM | POA: Diagnosis not present

## 2022-05-13 DIAGNOSIS — M47816 Spondylosis without myelopathy or radiculopathy, lumbar region: Secondary | ICD-10-CM | POA: Diagnosis not present

## 2022-05-13 DIAGNOSIS — R270 Ataxia, unspecified: Secondary | ICD-10-CM | POA: Diagnosis not present

## 2022-05-13 LAB — BASIC METABOLIC PANEL
Anion gap: 8 (ref 5–15)
BUN: 37 mg/dL — ABNORMAL HIGH (ref 8–23)
CO2: 27 mmol/L (ref 22–32)
Calcium: 8.9 mg/dL (ref 8.9–10.3)
Chloride: 103 mmol/L (ref 98–111)
Creatinine, Ser: 1.62 mg/dL — ABNORMAL HIGH (ref 0.61–1.24)
GFR, Estimated: 41 mL/min — ABNORMAL LOW (ref >60)
Glucose, Bld: 120 mg/dL — ABNORMAL HIGH (ref 70–99)
Potassium: 4.2 mmol/L (ref 3.5–5.1)
Sodium: 138 mmol/L (ref 135–145)

## 2022-05-13 LAB — URINALYSIS, ROUTINE W REFLEX MICROSCOPIC
Bacteria, UA: NONE SEEN
Bilirubin Urine: NEGATIVE
Glucose, UA: >=500 mg/dL — AB
Hgb urine dipstick: NEGATIVE
Ketones, ur: NEGATIVE mg/dL
Leukocytes,Ua: NEGATIVE
Nitrite: NEGATIVE
Protein, ur: NEGATIVE mg/dL
Specific Gravity, Urine: 1.024 (ref 1.005–1.030)
pH: 5 (ref 5.0–8.0)

## 2022-05-13 LAB — CBC WITH DIFFERENTIAL/PLATELET
Abs Immature Granulocytes: 0.06 10*3/uL (ref 0.00–0.07)
Basophils Absolute: 0.1 10*3/uL (ref 0.0–0.1)
Basophils Relative: 1 %
Eosinophils Absolute: 0.9 10*3/uL — ABNORMAL HIGH (ref 0.0–0.5)
Eosinophils Relative: 12 %
HCT: 34.1 % — ABNORMAL LOW (ref 39.0–52.0)
Hemoglobin: 11.3 g/dL — ABNORMAL LOW (ref 13.0–17.0)
Immature Granulocytes: 1 %
Lymphocytes Relative: 15 %
Lymphs Abs: 1.1 10*3/uL (ref 0.7–4.0)
MCH: 34.8 pg — ABNORMAL HIGH (ref 26.0–34.0)
MCHC: 33.1 g/dL (ref 30.0–36.0)
MCV: 104.9 fL — ABNORMAL HIGH (ref 80.0–100.0)
Monocytes Absolute: 0.5 10*3/uL (ref 0.1–1.0)
Monocytes Relative: 7 %
Neutro Abs: 4.7 10*3/uL (ref 1.7–7.7)
Neutrophils Relative %: 64 %
Platelets: 151 10*3/uL (ref 150–400)
RBC: 3.25 MIL/uL — ABNORMAL LOW (ref 4.22–5.81)
RDW: 13.3 % (ref 11.5–15.5)
WBC: 7.2 10*3/uL (ref 4.0–10.5)
nRBC: 0 % (ref 0.0–0.2)

## 2022-05-13 LAB — LACTIC ACID, PLASMA
Lactic Acid, Venous: 1.1 mmol/L (ref 0.5–1.9)
Lactic Acid, Venous: 1 mmol/L (ref 0.5–1.9)

## 2022-05-13 LAB — HEPATIC FUNCTION PANEL
ALT: 12 U/L (ref 0–44)
AST: 16 U/L (ref 15–41)
Albumin: 2.8 g/dL — ABNORMAL LOW (ref 3.5–5.0)
Alkaline Phosphatase: 35 U/L — ABNORMAL LOW (ref 38–126)
Bilirubin, Direct: 0.2 mg/dL (ref 0.0–0.2)
Indirect Bilirubin: 0.4 mg/dL (ref 0.3–0.9)
Total Bilirubin: 0.6 mg/dL (ref 0.3–1.2)
Total Protein: 5.5 g/dL — ABNORMAL LOW (ref 6.5–8.1)

## 2022-05-13 LAB — TSH: TSH: 1.331 u[IU]/mL (ref 0.350–4.500)

## 2022-05-13 LAB — RESP PANEL BY RT-PCR (FLU A&B, COVID) ARPGX2
Influenza A by PCR: NEGATIVE
Influenza B by PCR: NEGATIVE
SARS Coronavirus 2 by RT PCR: NEGATIVE

## 2022-05-13 LAB — RETICULOCYTES
Immature Retic Fract: 11.7 % (ref 2.3–15.9)
RBC.: 3.22 MIL/uL — ABNORMAL LOW (ref 4.22–5.81)
Retic Count, Absolute: 36.7 10*3/uL (ref 19.0–186.0)
Retic Ct Pct: 1.1 % (ref 0.4–3.1)

## 2022-05-13 LAB — GLUCOSE, CAPILLARY
Glucose-Capillary: 108 mg/dL — ABNORMAL HIGH (ref 70–99)
Glucose-Capillary: 110 mg/dL — ABNORMAL HIGH (ref 70–99)
Glucose-Capillary: 206 mg/dL — ABNORMAL HIGH (ref 70–99)
Glucose-Capillary: 307 mg/dL — ABNORMAL HIGH (ref 70–99)
Glucose-Capillary: 164 mg/dL — ABNORMAL HIGH (ref 70–99)

## 2022-05-13 LAB — FOLATE: Folate: 13.3 ng/mL (ref >5.9)

## 2022-05-13 LAB — IRON AND TIBC
Iron: 24 ug/dL — ABNORMAL LOW (ref 45–182)
Saturation Ratios: 12 % — ABNORMAL LOW (ref 17.9–39.5)
TIBC: 196 ug/dL — ABNORMAL LOW (ref 250–450)
UIBC: 172 ug/dL

## 2022-05-13 LAB — VITAMIN B12: Vitamin B-12: 313 pg/mL (ref 180–914)

## 2022-05-13 LAB — POC OCCULT BLOOD, ED: Fecal Occult Bld: NEGATIVE

## 2022-05-13 LAB — CK: Total CK: 32 U/L — ABNORMAL LOW (ref 49–397)

## 2022-05-13 LAB — FERRITIN: Ferritin: 282 ng/mL (ref 24–336)

## 2022-05-13 MED ORDER — LOSARTAN POTASSIUM 50 MG PO TABS
25.0000 mg | ORAL_TABLET | Freq: Every day | ORAL | Status: DC
  Administered 2022-05-13 – 2022-05-15 (×3): 25 mg via ORAL
  Filled 2022-05-13 (×3): qty 1

## 2022-05-13 MED ORDER — TERBINAFINE HCL 1 % EX CREA
TOPICAL_CREAM | Freq: Every day | CUTANEOUS | Status: DC
  Filled 2022-05-13: qty 12

## 2022-05-13 MED ORDER — SODIUM CHLORIDE 0.9 % IV BOLUS
500.0000 mL | Freq: Once | INTRAVENOUS | Status: AC
  Administered 2022-05-13: 500 mL via INTRAVENOUS

## 2022-05-13 MED ORDER — POLYETHYLENE GLYCOL 3350 17 G PO PACK
17.0000 g | PACK | Freq: Every day | ORAL | Status: DC
  Administered 2022-05-13 – 2022-05-18 (×4): 17 g via ORAL
  Filled 2022-05-13 (×6): qty 1

## 2022-05-13 MED ORDER — SODIUM CHLORIDE 0.9 % IV SOLN
2.0000 g | INTRAVENOUS | Status: DC
  Administered 2022-05-13 – 2022-05-15 (×3): 2 g via INTRAVENOUS
  Filled 2022-05-13 (×2): qty 20

## 2022-05-13 MED ORDER — POLYVINYL ALCOHOL 1.4 % OP SOLN
1.0000 [drp] | Freq: Every day | OPHTHALMIC | Status: DC
Start: 2022-05-13 — End: 2022-05-21
  Administered 2022-05-13 – 2022-05-19 (×7): 1 [drp] via OPHTHALMIC
  Filled 2022-05-13: qty 15

## 2022-05-13 MED ORDER — ACETAMINOPHEN 325 MG PO TABS
650.0000 mg | ORAL_TABLET | Freq: Four times a day (QID) | ORAL | Status: DC | PRN
Start: 2022-05-13 — End: 2022-05-14
  Administered 2022-05-13 (×3): 650 mg via ORAL
  Filled 2022-05-13 (×4): qty 2

## 2022-05-13 MED ORDER — GABAPENTIN 100 MG PO CAPS
100.0000 mg | ORAL_CAPSULE | Freq: Two times a day (BID) | ORAL | Status: DC
  Administered 2022-05-13 – 2022-05-14 (×3): 100 mg via ORAL
  Filled 2022-05-13 (×3): qty 1

## 2022-05-13 MED ORDER — SODIUM CHLORIDE 0.9 % IV SOLN
500.0000 mg | INTRAVENOUS | Status: DC
  Administered 2022-05-14 – 2022-05-15 (×2): 500 mg via INTRAVENOUS
  Filled 2022-05-13 (×2): qty 5

## 2022-05-13 MED ORDER — ASPIRIN 81 MG PO TBEC
81.0000 mg | DELAYED_RELEASE_TABLET | Freq: Every evening | ORAL | Status: DC
  Administered 2022-05-13 – 2022-05-15 (×3): 81 mg via ORAL
  Filled 2022-05-13 (×3): qty 1

## 2022-05-13 MED ORDER — SODIUM CHLORIDE 0.9 % IV SOLN
500.0000 mg | Freq: Once | INTRAVENOUS | Status: AC
  Administered 2022-05-13: 500 mg via INTRAVENOUS
  Filled 2022-05-13: qty 5

## 2022-05-13 MED ORDER — SODIUM CHLORIDE 0.9 % IV SOLN
INTRAVENOUS | Status: AC
Start: 2022-05-13 — End: 2022-05-14

## 2022-05-13 MED ORDER — SODIUM CHLORIDE 0.9 % IV SOLN
1.0000 g | Freq: Once | INTRAVENOUS | Status: AC
  Administered 2022-05-13: 1 g via INTRAVENOUS
  Filled 2022-05-13: qty 10

## 2022-05-13 MED ORDER — ENOXAPARIN SODIUM 30 MG/0.3ML IJ SOSY
30.0000 mg | PREFILLED_SYRINGE | INTRAMUSCULAR | Status: DC
Start: 2022-05-13 — End: 2022-05-17
  Administered 2022-05-14 – 2022-05-16 (×3): 30 mg via SUBCUTANEOUS
  Filled 2022-05-13 (×4): qty 0.3

## 2022-05-13 MED ORDER — ENSURE ENLIVE PO LIQD
237.0000 mL | Freq: Two times a day (BID) | ORAL | Status: DC
  Administered 2022-05-13 – 2022-05-19 (×8): 237 mL via ORAL

## 2022-05-13 MED ORDER — CLOBETASOL PROPIONATE 0.05 % EX OINT
1.0000 | TOPICAL_OINTMENT | Freq: Two times a day (BID) | CUTANEOUS | Status: DC
Start: 2022-05-13 — End: 2022-05-21
  Filled 2022-05-13: qty 15

## 2022-05-13 MED ORDER — INSULIN ASPART 100 UNIT/ML IJ SOLN
0.0000 [IU] | Freq: Three times a day (TID) | INTRAMUSCULAR | Status: DC
  Administered 2022-05-13: 4 [IU] via SUBCUTANEOUS
  Administered 2022-05-13 – 2022-05-14 (×3): 2 [IU] via SUBCUTANEOUS
  Administered 2022-05-14 – 2022-05-15 (×2): 1 [IU] via SUBCUTANEOUS
  Administered 2022-05-15: 2 [IU] via SUBCUTANEOUS
  Administered 2022-05-16 – 2022-05-17 (×3): 1 [IU] via SUBCUTANEOUS

## 2022-05-13 MED ORDER — ACITRETIN 10 MG PO CAPS
10.0000 mg | ORAL_CAPSULE | Freq: Every day | ORAL | Status: DC
  Filled 2022-05-13: qty 1

## 2022-05-13 MED ORDER — POLYETHYL GLYCOL-PROPYL GLYCOL 0.4-0.3 % OP GEL: Freq: Every day | OPHTHALMIC | Status: DC

## 2022-05-13 MED ORDER — TAMSULOSIN HCL 0.4 MG PO CAPS
0.4000 mg | ORAL_CAPSULE | Freq: Every day | ORAL | Status: DC
  Administered 2022-05-13 – 2022-05-18 (×5): 0.4 mg via ORAL
  Filled 2022-05-13 (×6): qty 1

## 2022-05-13 MED ORDER — TACROLIMUS 0.1 % EX OINT
1.0000 | TOPICAL_OINTMENT | Freq: Two times a day (BID) | CUTANEOUS | Status: DC
Start: 2022-05-13 — End: 2022-05-15
  Filled 2022-05-13: qty 1

## 2022-05-13 MED ORDER — PRIMIDONE 50 MG PO TABS
25.0000 mg | ORAL_TABLET | Freq: Every day | ORAL | Status: DC
  Administered 2022-05-13 – 2022-05-18 (×5): 25 mg via ORAL
  Filled 2022-05-13 (×8): qty 0.5

## 2022-05-13 MED ORDER — ATORVASTATIN CALCIUM 10 MG PO TABS
10.0000 mg | ORAL_TABLET | ORAL | Status: DC
  Administered 2022-05-14: 10 mg via ORAL
  Filled 2022-05-13: qty 1

## 2022-05-13 NOTE — ED Notes (Signed)
Pt ambulated to restroom with stand by assistance

## 2022-05-13 NOTE — Evaluation (Signed)
Physical Therapy Evaluation Patient Details Name: George Barnett MRN: [id number] DOB: 1933-04-01 Today's Date: 05/13/2022  History of Present Illness  88 y.o. male with complex medical history including stage IV CKD, diabetes mellitus, erosive pustular dermatosis of the scalp, hypertension, restless leg syndrome, and CAD who presents to the emergency department for evaluation of progressively worsening weakness.  Clinical Impression  Pt admitted with above diagnosis.  Pt currently with functional limitations due to the deficits listed below (see PT Problem List). Pt will benefit from skilled PT to increase their independence and safety with mobility to allow discharge to the venue listed below.  Pt reports falling due to LE weakness lately.  Pt also reports he has forcing himself to eat at home.  Pt states he typically sleeps a lot due to pain on his scalp.  Pt and spouse feel pt can return home upon d/c however pt needs to keep moving during hospitalization.  Pt would benefit from HHPT upon d/c and initial 24/7 assist for safety.       Recommendations for follow up therapy are one component of a multi-disciplinary discharge planning process, led by the attending physician.  Recommendations may be updated based on patient status, additional functional criteria and insurance authorization.  Follow Up Recommendations Home health PT      Assistance Recommended at Discharge Frequent or constant Supervision/Assistance  Patient can return home with the following  A little help with walking and/or transfers;A little help with bathing/dressing/bathroom;Assist for transportation    Equipment Recommendations None recommended by PT  Recommendations for Other Services       Functional Status Assessment Patient has had a recent decline in their functional status and demonstrates the ability to make significant improvements in function in a reasonable and predictable amount of time.     Precautions /  Restrictions Precautions Precautions: Fall Restrictions Weight Bearing Restrictions: No      Mobility  Bed Mobility Overal bed mobility: Needs Assistance Bed Mobility: Supine to Sit     Supine to sit: Min assist, HOB elevated     General bed mobility comments: assist for trunk upright    Transfers Overall transfer level: Needs assistance Equipment used: Rolling walker (2 wheels) Transfers: Sit to/from Stand Sit to Stand: Min assist           General transfer comment: assist to rise and steady, posterior lean present, cues for hand placement    Ambulation/Gait Ambulation/Gait assistance: Min guard Gait Distance (Feet): 4 Feet Assistive device: Rolling walker (2 wheels) Gait Pattern/deviations: Step-through pattern, Decreased stride length       General Gait Details: pt marched in place for approx 2 minutes x2, then ambulated 4 ft to recliner, pt self limited activity due to fear of falling and fatigue  Stairs            Wheelchair Mobility    Modified Rankin (Stroke Patients Only)       Balance Overall balance assessment: Needs assistance, History of Falls         Standing balance support: Bilateral upper extremity supported, Reliant on assistive device for balance Standing balance-Leahy Scale: Poor Standing balance comment: reliant on UE support                             Pertinent Vitals/Pain Pain Assessment Pain Assessment: Faces Faces Pain Scale: Hurts even more Pain Location: scalp Pain Descriptors / Indicators: Discomfort Pain Intervention(s): Monitored during session  Home Living   Living Arrangements: Spouse/significant other Available Help at Discharge: Family;Available 24 hours/day Type of Home: Independent living facility         Home Layout: One level Home Equipment: Agricultural consultant (2 wheels);BSC/3in1      Prior Function Prior Level of Function : Independent/Modified Independent              Mobility Comments: pt typically independent, reports weakness and difficulty getting up after falling at home, states he has fallen numerous times lately due to legs giving out/weakness       Hand Dominance        Extremity/Trunk Assessment        Lower Extremity Assessment Lower Extremity Assessment: Generalized weakness    Cervical / Trunk Assessment Cervical / Trunk Assessment: Other exceptions Cervical / Trunk Exceptions: cachectic appearance  Communication   Communication: No difficulties  Cognition Arousal/Alertness: Awake/alert Behavior During Therapy: WFL for tasks assessed/performed Overall Cognitive Status: Within Functional Limits for tasks assessed                                          General Comments      Exercises     Assessment/Plan    PT Assessment Patient needs continued PT services  PT Problem List Decreased strength;Decreased activity tolerance;Decreased mobility;Decreased balance;Decreased knowledge of use of DME       PT Treatment Interventions DME instruction;Gait training;Therapeutic exercise;Therapeutic activities;Functional mobility training;Patient/family education;Balance training    PT Goals (Current goals can be found in the Care Plan section)  Acute Rehab PT Goals PT Goal Formulation: With patient/family Time For Goal Achievement: 05/27/22 Potential to Achieve Goals: Good    Frequency Min 3X/week     Co-evaluation               AM-PAC PT "6 Clicks" Mobility  Outcome Measure Help needed turning from your back to your side while in a flat bed without using bedrails?: A Little Help needed moving from lying on your back to sitting on the side of a flat bed without using bedrails?: A Little Help needed moving to and from a bed to a chair (including a wheelchair)?: A Little Help needed standing up from a chair using your arms (e.g., wheelchair or bedside chair)?: A Little Help needed to walk in hospital  room?: A Lot Help needed climbing 3-5 steps with a railing? : A Lot 6 Click Score: 16    End of Session Equipment Utilized During Treatment: Gait belt Activity Tolerance: Patient tolerated treatment well Patient left: in chair;with call bell/phone within reach;with chair alarm set Nurse Communication: Mobility status PT Visit Diagnosis: Other abnormalities of gait and mobility (R26.89)    Time: 1610-9604 PT Time Calculation (min) (ACUTE ONLY): 20 min   Charges:   PT Evaluation $PT Eval Low Complexity: 1 Low         Kati PT, DPT Acute Rehabilitation Services Pager: [phone] Office: [phone]   Janan Halter [place] 05/13/2022, 11:43 AM

## 2022-05-14 DIAGNOSIS — M792 Neuralgia and neuritis, unspecified: Secondary | ICD-10-CM | POA: Diagnosis not present

## 2022-05-14 DIAGNOSIS — C44321 Squamous cell carcinoma of skin of nose: Secondary | ICD-10-CM | POA: Diagnosis not present

## 2022-05-14 DIAGNOSIS — C44529 Squamous cell carcinoma of skin of other part of trunk: Secondary | ICD-10-CM | POA: Diagnosis not present

## 2022-05-14 DIAGNOSIS — J9690 Respiratory failure, unspecified, unspecified whether with hypoxia or hypercapnia: Secondary | ICD-10-CM | POA: Diagnosis not present

## 2022-05-14 DIAGNOSIS — Z66 Do not resuscitate: Secondary | ICD-10-CM | POA: Diagnosis present

## 2022-05-14 DIAGNOSIS — E1122 Type 2 diabetes mellitus with diabetic chronic kidney disease: Secondary | ICD-10-CM | POA: Diagnosis present

## 2022-05-14 DIAGNOSIS — L89321 Pressure ulcer of left buttock, stage 1: Secondary | ICD-10-CM | POA: Diagnosis present

## 2022-05-14 DIAGNOSIS — R1314 Dysphagia, pharyngoesophageal phase: Secondary | ICD-10-CM | POA: Diagnosis not present

## 2022-05-14 DIAGNOSIS — J90 Pleural effusion, not elsewhere classified: Secondary | ICD-10-CM | POA: Diagnosis not present

## 2022-05-14 DIAGNOSIS — J189 Pneumonia, unspecified organism: Secondary | ICD-10-CM | POA: Diagnosis present

## 2022-05-14 DIAGNOSIS — Z7401 Bed confinement status: Secondary | ICD-10-CM | POA: Diagnosis not present

## 2022-05-14 DIAGNOSIS — N1831 Chronic kidney disease, stage 3a: Secondary | ICD-10-CM | POA: Diagnosis not present

## 2022-05-14 DIAGNOSIS — R29898 Other symptoms and signs involving the musculoskeletal system: Secondary | ICD-10-CM | POA: Diagnosis not present

## 2022-05-14 DIAGNOSIS — C4490 Unspecified malignant neoplasm of skin, unspecified: Secondary | ICD-10-CM | POA: Diagnosis present

## 2022-05-14 DIAGNOSIS — N179 Acute kidney failure, unspecified: Secondary | ICD-10-CM | POA: Diagnosis not present

## 2022-05-14 DIAGNOSIS — R531 Weakness: Secondary | ICD-10-CM | POA: Diagnosis not present

## 2022-05-14 DIAGNOSIS — E1169 Type 2 diabetes mellitus with other specified complication: Secondary | ICD-10-CM | POA: Diagnosis present

## 2022-05-14 DIAGNOSIS — J690 Pneumonitis due to inhalation of food and vomit: Secondary | ICD-10-CM | POA: Diagnosis not present

## 2022-05-14 DIAGNOSIS — Z515 Encounter for palliative care: Secondary | ICD-10-CM | POA: Diagnosis not present

## 2022-05-14 DIAGNOSIS — D539 Nutritional anemia, unspecified: Secondary | ICD-10-CM | POA: Diagnosis present

## 2022-05-14 DIAGNOSIS — L988 Other specified disorders of the skin and subcutaneous tissue: Secondary | ICD-10-CM | POA: Diagnosis not present

## 2022-05-14 DIAGNOSIS — R9431 Abnormal electrocardiogram [ECG] [EKG]: Secondary | ICD-10-CM | POA: Diagnosis not present

## 2022-05-14 DIAGNOSIS — R509 Fever, unspecified: Secondary | ICD-10-CM | POA: Diagnosis present

## 2022-05-14 DIAGNOSIS — J9601 Acute respiratory failure with hypoxia: Secondary | ICD-10-CM | POA: Diagnosis present

## 2022-05-14 DIAGNOSIS — D509 Iron deficiency anemia, unspecified: Secondary | ICD-10-CM | POA: Diagnosis present

## 2022-05-14 DIAGNOSIS — N184 Chronic kidney disease, stage 4 (severe): Secondary | ICD-10-CM | POA: Diagnosis present

## 2022-05-14 DIAGNOSIS — G834 Cauda equina syndrome: Secondary | ICD-10-CM | POA: Diagnosis present

## 2022-05-14 DIAGNOSIS — Z7189 Other specified counseling: Secondary | ICD-10-CM | POA: Diagnosis not present

## 2022-05-14 DIAGNOSIS — K219 Gastro-esophageal reflux disease without esophagitis: Secondary | ICD-10-CM | POA: Diagnosis present

## 2022-05-14 DIAGNOSIS — Z7984 Long term (current) use of oral hypoglycemic drugs: Secondary | ICD-10-CM | POA: Diagnosis not present

## 2022-05-14 DIAGNOSIS — E871 Hypo-osmolality and hyponatremia: Secondary | ICD-10-CM | POA: Diagnosis not present

## 2022-05-14 DIAGNOSIS — I959 Hypotension, unspecified: Secondary | ICD-10-CM | POA: Diagnosis not present

## 2022-05-14 DIAGNOSIS — L03811 Cellulitis of head [any part, except face]: Secondary | ICD-10-CM | POA: Diagnosis present

## 2022-05-14 DIAGNOSIS — I2510 Atherosclerotic heart disease of native coronary artery without angina pectoris: Secondary | ICD-10-CM | POA: Diagnosis not present

## 2022-05-14 DIAGNOSIS — G2581 Restless legs syndrome: Secondary | ICD-10-CM | POA: Diagnosis present

## 2022-05-14 DIAGNOSIS — E119 Type 2 diabetes mellitus without complications: Secondary | ICD-10-CM | POA: Diagnosis not present

## 2022-05-14 DIAGNOSIS — L89311 Pressure ulcer of right buttock, stage 1: Secondary | ICD-10-CM | POA: Diagnosis present

## 2022-05-14 DIAGNOSIS — I129 Hypertensive chronic kidney disease with stage 1 through stage 4 chronic kidney disease, or unspecified chronic kidney disease: Secondary | ICD-10-CM | POA: Diagnosis present

## 2022-05-14 DIAGNOSIS — A419 Sepsis, unspecified organism: Secondary | ICD-10-CM | POA: Diagnosis not present

## 2022-05-14 DIAGNOSIS — J9600 Acute respiratory failure, unspecified whether with hypoxia or hypercapnia: Secondary | ICD-10-CM | POA: Diagnosis not present

## 2022-05-14 DIAGNOSIS — R0602 Shortness of breath: Secondary | ICD-10-CM | POA: Diagnosis not present

## 2022-05-14 DIAGNOSIS — I509 Heart failure, unspecified: Secondary | ICD-10-CM | POA: Diagnosis not present

## 2022-05-14 DIAGNOSIS — Z20822 Contact with and (suspected) exposure to covid-19: Secondary | ICD-10-CM | POA: Diagnosis present

## 2022-05-14 DIAGNOSIS — R1310 Dysphagia, unspecified: Secondary | ICD-10-CM | POA: Diagnosis not present

## 2022-05-14 DIAGNOSIS — Z9981 Dependence on supplemental oxygen: Secondary | ICD-10-CM | POA: Diagnosis not present

## 2022-05-14 DIAGNOSIS — F411 Generalized anxiety disorder: Secondary | ICD-10-CM | POA: Diagnosis present

## 2022-05-14 LAB — URINE CULTURE: Culture: NO GROWTH

## 2022-05-14 LAB — GLUCOSE, CAPILLARY
Glucose-Capillary: 156 mg/dL — ABNORMAL HIGH (ref 70–99)
Glucose-Capillary: 249 mg/dL — ABNORMAL HIGH (ref 70–99)
Glucose-Capillary: 232 mg/dL — ABNORMAL HIGH (ref 70–99)
Glucose-Capillary: 154 mg/dL — ABNORMAL HIGH (ref 70–99)

## 2022-05-14 MED ORDER — ACETAMINOPHEN 500 MG PO TABS
500.0000 mg | ORAL_TABLET | Freq: Four times a day (QID) | ORAL | Status: DC
  Administered 2022-05-14 – 2022-05-18 (×12): 500 mg via ORAL
  Filled 2022-05-14 (×13): qty 1

## 2022-05-14 MED ORDER — BISACODYL 10 MG RE SUPP
10.0000 mg | Freq: Once | RECTAL | Status: AC
  Administered 2022-05-14: 10 mg via RECTAL
  Filled 2022-05-14: qty 1

## 2022-05-14 MED ORDER — GABAPENTIN 100 MG PO CAPS
100.0000 mg | ORAL_CAPSULE | Freq: Three times a day (TID) | ORAL | Status: DC
Start: 2022-05-14 — End: 2022-05-21
  Administered 2022-05-14 – 2022-05-19 (×12): 100 mg via ORAL
  Filled 2022-05-14 (×12): qty 1

## 2022-05-14 MED ORDER — MORPHINE SULFATE 15 MG PO TABS: 15.0000 mg | ORAL_TABLET | Freq: Four times a day (QID) | ORAL | Status: DC | PRN

## 2022-05-14 MED ORDER — ADULT MULTIVITAMIN W/MINERALS CH
1.0000 | ORAL_TABLET | Freq: Every day | ORAL | Status: DC
  Administered 2022-05-14 – 2022-05-15 (×2): 1 via ORAL
  Filled 2022-05-14 (×2): qty 1

## 2022-05-14 NOTE — [place] FL2 (Signed)
[place] MEDICAID FL2 LEVEL OF CARE SCREENING TOOL     IDENTIFICATION  Patient Name: George Barnett Birthdate: May 17, 1933 Sex: male Admission Date (Current Location): 05/12/2022  [facility] and [place] Number:  Producer, television/film/video and Address:  [facility],  [address] [place] [address]      Provider Number: [id number]  Attending Physician Name and Address:  Dorcas Carrow, MD  Relative Name and Phone Number:       Current Level of Care: Hospital Recommended Level of Care: Skilled Nursing Facility Prior Approval Number:    Date Approved/Denied:   PASRR Number: [id number]  Discharge Plan: SNF    Current Diagnoses: Patient Active Problem List   Diagnosis Date Noted   Fever 05/14/2022   CAP (community acquired pneumonia) 05/13/2022   Weakness 05/13/2022   Lower extremity weakness 05/13/2022   Squamous cell cancer of skin of nose 04/29/2022   Hyperlipidemia 01/17/2022   Basal cell carcinoma 10/29/2021   Squamous cell carcinoma of antihelix, right 11/02/2020   Prostate enlargement    History of myocardial infarction    Elbow effusion, left    Cataract    Stage 3a chronic kidney disease (CKD) (HCC) 09/08/2020   White coat syndrome with diagnosis of hypertension 03/10/2020   Stress and adjustment reaction 01/12/2020   Chronic head/ "scalp" pain 01/12/2020   Anemia due to chemical agent (HCC)-dermatologic medications most likely 12/08/2019   Erosive pustular dermatosis of scalp 12/08/2019   Benign essential tremor 12/08/2019   Vitamin D deficiency 12/08/2019   Recurrent right inguinal hernia 05/18/2019   Atypical chest pain 05/07/2019   Venous insufficiency of both lower extremities 11/27/2018   Tremor of both hands-  both wiht rest and activity; acutely worse 11/27/2018   Orthostatic hypotension 11/05/2018   Bilateral lower extremity edema 11/17/2017   Essential hypertension 08/05/2017   Microalbuminuria due to type 2 diabetes mellitus (HCC)  08/05/2017   RLS (restless legs syndrome) 06/17/2017   GERD (gastroesophageal reflux disease) 05/05/2017   Glossodynia 05/05/2017   Costochondritis 05/05/2017   Type 2 diabetes mellitus with stage 3 chronic kidney disease, without long-term current use of insulin (HCC) 05/05/2017   CAD (coronary artery disease) 03/25/2017   Snoring 03/25/2017   Urinary retention due to benign prostatic hyperplasia 01/25/2015   Primary osteoarthritis of right shoulder 01/24/2015    Orientation RESPIRATION BLADDER Height & Weight     Self, Time, Situation, Place  Normal Continent Weight: 63.2 kg Height:  6' (182.9 cm)  BEHAVIORAL SYMPTOMS/MOOD NEUROLOGICAL BOWEL NUTRITION STATUS      Continent Diet (Heart healthy, carb modified)  AMBULATORY STATUS COMMUNICATION OF NEEDS Skin   Limited Assist Verbally Skin abrasions (Scalp redness and swelling)                       Personal Care Assistance Level of Assistance  Bathing, Feeding, Dressing Bathing Assistance: Limited assistance Feeding assistance: Independent Dressing Assistance: Limited assistance     Functional Limitations Info  Sight, Hearing, Speech Sight Info: Adequate Hearing Info: Adequate Speech Info: Adequate    SPECIAL CARE FACTORS FREQUENCY  PT (By licensed PT), OT (By licensed OT)     PT Frequency: 5 x weekly OT Frequency: 5 x weekly            Contractures Contractures Info: Not present    Additional Factors Info  Code Status, Allergies Code Status Info: Full Allergies Info: Exenatide, Nadolol, Sitagliptin phosphate  Current Medications (05/14/2022):  This is the current hospital active medication list Current Facility-Administered Medications  Medication Dose Route Frequency Provider Last Rate Last Admin   acetaminophen (TYLENOL) tablet 500 mg  500 mg Oral Q6H Ghimire, Lyndel Safe, MD   500 mg at 05/14/22 1037   acitretin (SORIATANE) capsule 10 mg  10 mg Oral QAC breakfast Eduard Clos, MD        aspirin EC tablet 81 mg  81 mg Oral QPM Eduard Clos, MD   81 mg at 05/13/22 1726   atorvastatin (LIPITOR) tablet 10 mg  10 mg Oral Cristela Felt, MD   10 mg at 05/14/22 1037   azithromycin (ZITHROMAX) 500 mg in sodium chloride 0.9 % 250 mL IVPB  500 mg Intravenous Q24H Eduard Clos, MD   Stopped at 05/14/22 0232   cefTRIAXone (ROCEPHIN) 2 g in sodium chloride 0.9 % 100 mL IVPB  2 g Intravenous Q24H Eduard Clos, MD   Stopped at 05/13/22 1405   clobetasol ointment (TEMOVATE) 0.05 % 1 Application  1 Application Topical BID Eduard Clos, MD       enoxaparin (LOVENOX) injection 30 mg  30 mg Subcutaneous Q24H Ghimire, Lyndel Safe, MD       feeding supplement (ENSURE ENLIVE / ENSURE PLUS) liquid 237 mL  237 mL Oral BID BM Dorcas Carrow, MD   237 mL at 05/14/22 1054   gabapentin (NEURONTIN) capsule 100 mg  100 mg Oral TID Rosalin Hawking, MD       insulin aspart (novoLOG) injection 0-6 Units  0-6 Units Subcutaneous TID WC Eduard Clos, MD   1 Units at 05/14/22 0845   losartan (COZAAR) tablet 25 mg  25 mg Oral Daily Eduard Clos, MD   25 mg at 05/14/22 1037   morphine (MSIR) tablet 15 mg  15 mg Oral Q6H PRN Dorcas Carrow, MD       polyethylene glycol (MIRALAX / GLYCOLAX) packet 17 g  17 g Oral Daily Dorcas Carrow, MD   17 g at 05/14/22 1037   polyvinyl alcohol (LIQUIFILM TEARS) 1.4 % ophthalmic solution 1 drop  1 drop Both Eyes Daily Eduard Clos, MD   1 drop at 05/14/22 1055   primidone (MYSOLINE) tablet 25 mg  25 mg Oral Daily Eduard Clos, MD   25 mg at 05/14/22 1037   tacrolimus (PROTOPIC) 0.1 % ointment 1 Application  1 Application Topical BID Eduard Clos, MD       tamsulosin [facility]) capsule 0.4 mg  0.4 mg Oral Daily Dorcas Carrow, MD   0.4 mg at 05/14/22 1037   terbinafine (LAMISIL) 1 % cream   Topical Daily Eduard Clos, MD   Given at 05/13/22 4401     Discharge Medications: Please see discharge summary for  a list of discharge medications.  Relevant Imaging Results:  Relevant Lab Results:   Additional Information ss# [id number]  Adelfo Diebel, Meriam Sprague, RN

## 2022-05-14 NOTE — Progress Notes (Signed)
Patient's home medication delivered to pharmacy for dispensing.

## 2022-05-14 NOTE — Progress Notes (Signed)
Initial Nutrition Assessment  DOCUMENTATION CODES:   Not applicable  INTERVENTION:  Liberalize diet from a heart healthy/carb modified to a carb modified diet to provide widest variety of menu options to enhance nutritional adequacy Ensure Enlive po BID, each supplement provides 350 kcal and 20 grams of protein. MVI with minerals daily Offer feeding assistance as needed  NUTRITION DIAGNOSIS:   Increased nutrient needs related to acute illness as evidenced by estimated needs.  GOAL:   Patient will meet greater than or equal to 90% of their needs  MONITOR:   PO intake, Supplement acceptance, Diet advancement, Labs, Weight trends  REASON FOR ASSESSMENT:   Malnutrition Screening Tool    ASSESSMENT:   Pt admitted from ILF with weakness of lower extremities likely d/t debility and generalized weakness. PMH significant for T2DM, HTN, CAD, chronic and recalcitrant erosive pustular dermatosis of the scalp.  Pt had febrile episode yesterday, likely d/t metabolic fever from his scalp.   Palliative Care following.   Pt using restroom at time of visit. Spoke with his family. They report that his meal intake is at his baseline intake and denies any changes to his appetite or intake. They report that he usually eats 3 meals per day and he drinks the [employer] brand nutrition supplements at home. They state that pt is able to self feed but has had difficulty d/t hand tremors. He needs foods that are easy to grab with his hands or easy to pick up with a fork. During admission he has been able to choose foods that he is able to tolerate well without requiring modifications to his diet order.   Meal completions:  06/26: 100%-breakfast 06/27: 100%-lunch  Pt would benefit from a liberalized diet given significant wt loss and benefit of being able to choose from a wider variety of menu items given inability to self feed certain menu items d/t tremors. Will also continue to provide Ensure for pt  throughout admission to aid in calorie and protein intake.   Within the last 6 months they have noticed he has lost about 10 lbs which they attribute to hand tremors and increase in pain d/t his scalp condition. Reviewed wt history. Noted a 10.7% wt loss between 12/20-06/26 which is significant for time frame.   Edema: mild pitting BLE  Medications: dolcolax, SSI 0-6 units TID, miralax, IV abx  Labs: BUN 37, Cr 1.62, alkaline phosphatase 35, GFR 41, CBG's 110-307 x24 hours, HgbA1c 7.4% (03/02)  NUTRITION - FOCUSED PHYSICAL EXAM: Pt using restroom. Deferred to follow up.   Diet Order:   Diet Order             Diet heart healthy/carb modified Room service appropriate? Yes; Fluid consistency: Thin  Diet effective now                   EDUCATION NEEDS:   Education needs have been addressed  Skin:  Skin Assessment: Reviewed RN Assessment  Last BM:  6/26  Height:   Ht Readings from Last 1 Encounters:  05/13/22 6' (1.829 m)    Weight:   Wt Readings from Last 1 Encounters:  05/13/22 63.2 kg    Ideal Body Weight:  80.9 kg  BMI:  Body mass index is 18.91 kg/m.  Estimated Nutritional Needs:   Kcal:  2000-2200  Protein:  100-115g  Fluid:  >/=2L  Drusilla Kanner, RDN, LDN Clinical Nutrition

## 2022-05-14 NOTE — Progress Notes (Signed)
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  In the emergency room nonfocal exam.  Chest x-ray with left lower lobe atelectasis.  Chronic anemia.  COVID and influenza negative.  Admitted due to significant symptoms.   Assessment & Plan:   Bilateral lower extremity weakness: This is likely due to progressive debility and generalized weakness.  No acute neurological findings. MRI of the thoracic and lumbar spine was done that showed multiple nerve root compressions, patient without radicular pain.  Mostly chronic issues.  Continue to work with PT OT and recommend PT OT on discharge. Recently on reduced dose of primidone by his neurologist.  That has helped his tremors.  We will continue.  Febrile episode/presumed pneumonia: Patient had fever at home, he had another episode of fever in the hospital 103 with no localizing source of infection. Chest x-ray with left lower lobe atelectasis, patient without pulmonary symptoms.  Less likely pneumonia. Urinalysis was normal. Blood cultures are ordered, negative so far. This is likely metabolic fever from his scalp.  We will continue presumptive antibiotics today with Rocephin and azithromycin pending final infection work-up/culture data available.  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  Followed by dermatology at [facility] and discussed with dermatology service line as below.  Patient is tired of the disease process, ointment applications. As per discussion with his dermatologist, Dr Nolon Bussing -Recommended to continue acitretin as this is less likely to contribute to  current presentation. -She recommended we discontinue dapsone as it may cause weakness. -She recommended we continue topical treatment with tacrolimus, clobetasol. Patient with extensive debility and now apprehensive using medications due to pain.  We will try scheduled doses of Tylenol every 6 hours and premedication with low-dose morphine to see if that helps him relieve pain and burning from the scalp.  Increasing dose of gabapentin.  I have also consulted palliative care team further recommendations.  Chronic iron deficiency anemia: Stable.  Type 2 diabetes, well controlled, on Jardiance and metformin at home.  Currently remains on sliding scale insulin.  Prostatism: Patient demonstrated evidence of urinary retention more than 350 mL after voiding.  Started on Flomax.  Will discharge on Flomax.  Essential tremors: On primidone, reduced dose recently.  Followed by neurology as outpatient.  Skin cancer: Has a skin graft procedure done, do not remove dressing.  Called and discussed with patient's dermatology at [facility] and above recommendations reviewed.   DVT prophylaxis: enoxaparin (LOVENOX) injection 30 mg Start: 05/13/22 1400 SCDs Start: 05/13/22 0618   Code Status: Full code Family Communication: Daughter at the bedside Disposition Plan: Status is: Observation The patient will require care spanning > 2 midnights and should be moved to inpatient because: Recurrent fever, urinary retention, IV antibiotics, pain relief.     Consultants:  Palliative  Procedures:  None  Antimicrobials:  Rocephin azithromycin 6/25---   Subjective: Patient seen and examined.  Daughter at the bedside.  Patient was somehow comfortable today after Tylenol.  He is scared to apply the ointment on his scalp and apprehensive about the tenderness and pain. He has difficulty with initial steps but subsequently can walk with walker. Fever 100.9 last night.  Objective: Vitals:  05/13/22  1736 05/13/22 2121 05/14/22 0026 05/14/22 0513  BP: 137/67 (!) 149/76 (!) 148/71 (!) 154/65  Pulse: 77 74 71 78  Resp: 16 16 18 18   Temp: (!) 100.9 F (38.3 C) 99.6 F (37.6 C) 99.6 F (37.6 C) 100 F (37.8 C)  TempSrc: Oral Oral Oral Oral  SpO2: 91% 92% (!) 88% (!) 88%  Weight:      Height:        Intake/Output Summary (Last 24 hours) at 05/14/2022 1132 Last data filed at 05/14/2022 0500 Gross per 24 hour  Intake 369.08 ml  Output 955 ml  Net -585.92 ml   Filed Weights   05/12/22 2203 05/13/22 0510  Weight: 65.3 kg 63.2 kg    Examination:  General exam: Appears calm and comfortable  Anxious.  Chronically sick looking.  Frail and debilitated. Respiratory system: No added sounds. Cardiovascular system: S1 & S2 heard, RRR.  No pedal edema. Gastrointestinal system: Abdomen is nondistended, soft and nontender. No organomegaly or masses felt. Normal bowel sounds heard. Central nervous system: Alert and oriented. No focal neurological deficits.  Has some resting tremors. Extremities: Symmetric 5 x 5 power.  Generalized weakness. Skin:  Extensive pustular lesions on his scalp and earlobes.    Data Reviewed: I have personally reviewed following labs and imaging studies  CBC: Recent Labs  Lab 05/12/22 2326 05/13/22 0552 05/13/22 0619  WBC 7.4 DUPLICATE REQUEST 7.2  NEUTROABS 5.4 DUPLICATE REQUEST 4.7  HGB 10.8* DUPLICATE REQUEST 11.3*  HCT 32.6* DUPLICATE REQUEST 34.1*  MCV 103.8* DUPLICATE REQUEST 104.9*  PLT 157 DUPLICATE REQUEST 151   Basic Metabolic Panel: Recent Labs  Lab 05/12/22 2326 05/13/22 0552  NA 138 138  K 4.4 4.2  CL 102 103  CO2 27 27  GLUCOSE 159* 120*  BUN 41* 37*  CREATININE 1.64* 1.62*  CALCIUM 9.5 8.9   GFR: Estimated Creatinine Clearance: 28.7 mL/min (A) (by C-G formula based on SCr of 1.62 mg/dL (H)). Liver Function Tests: Recent Labs  Lab 05/12/22 2326 05/13/22 0552  AST 16 16  ALT 14 12  ALKPHOS 39 35*  BILITOT 0.7 0.6   PROT 6.0* 5.5*  ALBUMIN 3.1* 2.8*   No results for input(s): "LIPASE", "AMYLASE" in the last 168 hours. No results for input(s): "AMMONIA" in the last 168 hours. Coagulation Profile: Recent Labs  Lab 05/12/22 2326  INR 1.1   Cardiac Enzymes: Recent Labs  Lab 05/13/22 0552  CKTOTAL 32*   BNP (last 3 results) No results for input(s): "PROBNP" in the last 8760 hours. HbA1C: No results for input(s): "HGBA1C" in the last 72 hours. CBG: Recent Labs  Lab 05/13/22 0716 05/13/22 1158 05/13/22 1623 05/13/22 2123 05/14/22 0751  GLUCAP 110* 206* 307* 164* 156*   Lipid Profile: No results for input(s): "CHOL", "HDL", "LDLCALC", "TRIG", "CHOLHDL", "LDLDIRECT" in the last 72 hours. Thyroid Function Tests: Recent Labs    05/12/22 2326  TSH 1.331   Anemia Panel: Recent Labs    05/13/22 0552 05/13/22 0615  VITAMINB12 313  --   FOLATE 13.3  --   FERRITIN 282  --   TIBC 196*  --   IRON 24*  --   RETICCTPCT DUPLICATE REQUEST 1.1   Sepsis Labs: Recent Labs  Lab 05/13/22 0100 05/13/22 0325  LATICACIDVEN 1.1 1.0    Recent Results (from the past 240 hour(s))  Urine Culture     Status: None   Collection Time: 05/12/22 11:26 PM   Specimen: In/Out Cath Urine  Result  Value Ref Range Status   Specimen Description   Final    IN/OUT CATH URINE Performed at [facility], [address] [place], [place] [address]    Special Requests   Final    NONE Performed at [facility], [address] [place], [place] [address]    Culture   Final    NO GROWTH Performed at [facility], [address], [place], [place] [address]    Report Status 05/14/2022 FINAL  Final  Resp Panel by RT-PCR (Flu A&B, Covid) Anterior Nasal Swab     Status: None   Collection Time: 05/12/22 11:26 PM   Specimen: Anterior Nasal Swab  Result Value Ref Range Status   SARS Coronavirus 2 by RT PCR NEGATIVE NEGATIVE Final    Comment: (NOTE) SARS-CoV-2 target  nucleic acids are NOT DETECTED.  The SARS-CoV-2 RNA is generally detectable in upper respiratory specimens during the acute phase of infection. The lowest concentration of SARS-CoV-2 viral copies this assay can detect is 138 copies/mL. A negative result does not preclude SARS-Cov-2 infection and should not be used as the sole basis for treatment or other patient management decisions. A negative result may occur with  improper specimen collection/handling, submission of specimen other than nasopharyngeal swab, presence of viral mutation(s) within the areas targeted by this assay, and inadequate number of viral copies(<138 copies/mL). A negative result must be combined with clinical observations, patient history, and epidemiological information. The expected result is Negative.  Fact Sheet for Patients:  BloggerCourse.com  Fact Sheet for Healthcare Providers:  SeriousBroker.it  This test is no t yet approved or cleared by the [place] FDA and  has been authorized for detection and/or diagnosis of SARS-CoV-2 by FDA under an Emergency Use Authorization (EUA). This EUA will remain  in effect (meaning this test can be used) for the duration of the COVID-19 declaration under Section 564(b)(1) of the Act, 21 U.S.C.section 360bbb-3(b)(1), unless the authorization is terminated  or revoked sooner.       Influenza A by PCR NEGATIVE NEGATIVE Final   Influenza B by PCR NEGATIVE NEGATIVE Final    Comment: (NOTE) The Xpert Xpress SARS-CoV-2/FLU/RSV plus assay is intended as an aid in the diagnosis of influenza from Nasopharyngeal swab specimens and should not be used as a sole basis for treatment. Nasal washings and aspirates are unacceptable for Xpert Xpress SARS-CoV-2/FLU/RSV testing.  Fact Sheet for Patients: BloggerCourse.com  Fact Sheet for Healthcare  Providers: SeriousBroker.it  This test is not yet approved or cleared by the [place] FDA and has been authorized for detection and/or diagnosis of SARS-CoV-2 by FDA under an Emergency Use Authorization (EUA). This EUA will remain in effect (meaning this test can be used) for the duration of the COVID-19 declaration under Section 564(b)(1) of the Act, 21 U.S.C. section 360bbb-3(b)(1), unless the authorization is terminated or revoked.  Performed at [facility], [address] [place], [place] [address]   Blood culture (routine x 2)     Status: None (Preliminary result)   Collection Time: 05/13/22  1:00 AM   Specimen: BLOOD  Result Value Ref Range Status   Specimen Description   Final    BLOOD RIGHT ANTECUBITAL Performed at [facility], [address], [place], [place] [address]    Special Requests   Final    BOTTLES DRAWN AEROBIC AND ANAEROBIC Blood Culture results may not be optimal due to an excessive volume of blood received  in culture bottles Performed at [facility], [address] [place], [place] [address]    Culture   Final    NO GROWTH < 24 HOURS Performed at [facility], [address] [place], [place] [address]    Report Status PENDING  Incomplete  Blood culture (routine x 2)     Status: None (Preliminary result)   Collection Time: 05/13/22  5:52 AM   Specimen: BLOOD  Result Value Ref Range Status   Specimen Description   Final    BLOOD RIGHT ANTECUBITAL Performed at [facility], [address], [place], [place] [address]    Special Requests   Final    BOTTLES DRAWN AEROBIC AND ANAEROBIC Blood Culture adequate volume Performed at [facility], [address] [place], [place] [address]    Culture   Final    NO GROWTH < 24 HOURS Performed at [facility], [address] [place], [place] [address]    Report Status  PENDING  Incomplete         Radiology Studies: MR LUMBAR SPINE WO CONTRAST  Result Date: 05/13/2022 CLINICAL DATA:  Low back pain, cauda equina syndrome suspected; Ataxia, nontraumatic, thoracic pathology suspected EXAM: MRI THORACIC AND LUMBAR SPINE WITHOUT CONTRAST TECHNIQUE: Multiplanar and multiecho pulse sequences of the thoracic and lumbar spine were obtained without intravenous contrast. COMPARISON:  CT 04/24/2017 FINDINGS: MRI THORACIC SPINE FINDINGS Alignment:  Somewhat exaggerated kyphosis.  No listhesis. Vertebrae:  No fracture, evidence of discitis, or bone lesion. Cord: No abnormal cord signal. Paraspinal and other soft tissues: Small bilateral pleural effusions. Disc levels: No large disc herniation. There is ligamentum flavum hypertrophy at T10-T11 and T11-T12 which results in mild spinal canal narrowing at T10-T11 and minimal left posterior canal narrowing at T11-T12. There is no significant neural foraminal stenosis at any level. MRI LUMBAR SPINE FINDINGS Segmentation:  Standard. Alignment:  Trace degenerative retrolisthesis at L5-S1. Vertebrae: No fracture, evidence of discitis, or aggressive bone lesion. Conus medullaris and cauda equina: Conus extends to the L1 level. Conus and cauda equina appear normal. Paraspinal and other soft tissues: Partially visualized left renal cyst which appears simple on prior CT in 2018. Lower paraspinal muscle atrophy. Disc levels: T12-L1: No significant stenosis. L1-L2: Mild disc bulging and bilateral facet arthropathy. No significant stenosis. L2-L3: Minimal foraminal disc bulging and bilateral facet arthropathy. No significant stenosis. L3-L4: Mild foraminal disc bulging, ligamentum hypertrophy and moderate bilateral facet arthropathy. There is moderate bilateral subarticular stenosis encroaching the descending L4 nerve roots (series 10, image 25). No significant neural foraminal stenosis. L4-L5: Mild disc bulging, ligamentum flavum hypertrophy and mild  facet arthropathy. There is moderate, right greater than left subarticular stenosis, encroaching the bilateral descending L5 nerve roots (series 10, image 32). No significant neural foraminal stenosis. L5-S1: Severe disc height loss with partial bony fusion and posterior endplate spurring. Moderate bilateral facet arthropathy. There is mild to moderate right and moderate left neural foraminal stenosis. No spinal canal stenosis. IMPRESSION: MR THORACIC SPINE IMPRESSION Mild spinal canal narrowing at T10-T11 due to prominent ligamentum flavum thickening. No large disc herniation or significant neural foraminal stenosis at any level. Small bilateral pleural effusions. MR LUMBAR SPINE IMPRESSION Multilevel degenerative changes, most prominent at L3-L4, L4-5, and L5-S1: L3-L4: Moderate bilateral subarticular stenosis encroaching the descending L4 nerve roots. L4-L5: Moderate, right greater than left subarticular stenosis encroaching the descending L5 nerve roots. L5-S1: Mild to moderate right and moderate left neural foraminal stenosis.  No spinal canal stenosis. Electronically Signed   By: Caprice Renshaw M.D.   On: 05/13/2022 08:46   MR THORACIC SPINE WO CONTRAST  Result Date: 05/13/2022 CLINICAL DATA:  Low back pain, cauda equina syndrome suspected; Ataxia, nontraumatic, thoracic pathology suspected EXAM: MRI THORACIC AND LUMBAR SPINE WITHOUT CONTRAST TECHNIQUE: Multiplanar and multiecho pulse sequences of the thoracic and lumbar spine were obtained without intravenous contrast. COMPARISON:  CT 04/24/2017 FINDINGS: MRI THORACIC SPINE FINDINGS Alignment:  Somewhat exaggerated kyphosis.  No listhesis. Vertebrae:  No fracture, evidence of discitis, or bone lesion. Cord: No abnormal cord signal. Paraspinal and other soft tissues: Small bilateral pleural effusions. Disc levels: No large disc herniation. There is ligamentum flavum hypertrophy at T10-T11 and T11-T12 which results in mild spinal canal narrowing at T10-T11 and  minimal left posterior canal narrowing at T11-T12. There is no significant neural foraminal stenosis at any level. MRI LUMBAR SPINE FINDINGS Segmentation:  Standard. Alignment:  Trace degenerative retrolisthesis at L5-S1. Vertebrae: No fracture, evidence of discitis, or aggressive bone lesion. Conus medullaris and cauda equina: Conus extends to the L1 level. Conus and cauda equina appear normal. Paraspinal and other soft tissues: Partially visualized left renal cyst which appears simple on prior CT in 2018. Lower paraspinal muscle atrophy. Disc levels: T12-L1: No significant stenosis. L1-L2: Mild disc bulging and bilateral facet arthropathy. No significant stenosis. L2-L3: Minimal foraminal disc bulging and bilateral facet arthropathy. No significant stenosis. L3-L4: Mild foraminal disc bulging, ligamentum hypertrophy and moderate bilateral facet arthropathy. There is moderate bilateral subarticular stenosis encroaching the descending L4 nerve roots (series 10, image 25). No significant neural foraminal stenosis. L4-L5: Mild disc bulging, ligamentum flavum hypertrophy and mild facet arthropathy. There is moderate, right greater than left subarticular stenosis, encroaching the bilateral descending L5 nerve roots (series 10, image 32). No significant neural foraminal stenosis. L5-S1: Severe disc height loss with partial bony fusion and posterior endplate spurring. Moderate bilateral facet arthropathy. There is mild to moderate right and moderate left neural foraminal stenosis. No spinal canal stenosis. IMPRESSION: MR THORACIC SPINE IMPRESSION Mild spinal canal narrowing at T10-T11 due to prominent ligamentum flavum thickening. No large disc herniation or significant neural foraminal stenosis at any level. Small bilateral pleural effusions. MR LUMBAR SPINE IMPRESSION Multilevel degenerative changes, most prominent at L3-L4, L4-5, and L5-S1: L3-L4: Moderate bilateral subarticular stenosis encroaching the descending L4  nerve roots. L4-L5: Moderate, right greater than left subarticular stenosis encroaching the descending L5 nerve roots. L5-S1: Mild to moderate right and moderate left neural foraminal stenosis. No spinal canal stenosis. Electronically Signed   By: Caprice Renshaw M.D.   On: 05/13/2022 08:46   DG Chest 2 View  Result Date: 05/12/2022 CLINICAL DATA:  Weakness. EXAM: CHEST - 2 VIEW COMPARISON:  Chest x-ray 03/03/2017 FINDINGS: The heart size and mediastinal contours are within normal limits. There are minimal patchy opacities in the left costophrenic angle. The lungs are otherwise clear. There is no pleural effusion or pneumothorax. Right shoulder arthroplasty is again seen. No acute fractures are identified. IMPRESSION: Minimal patchy airspace disease in the left lower lung worrisome for infection. Recommend follow-up PA and lateral chest x-ray in 4-6 weeks to confirm resolution. Electronically Signed   By: Darliss Cheney M.D.   On: 05/12/2022 23:03        Scheduled Meds:  acetaminophen  500 mg Oral Q6H   acitretin  10 mg Oral QAC breakfast   aspirin EC  81 mg Oral QPM   atorvastatin  10 mg Oral QODAY  clobetasol ointment  1 Application Topical BID   enoxaparin (LOVENOX) injection  30 mg Subcutaneous Q24H   feeding supplement  237 mL Oral BID BM   gabapentin  100 mg Oral TID   insulin aspart  0-6 Units Subcutaneous TID WC   losartan  25 mg Oral Daily   polyethylene glycol  17 g Oral Daily   polyvinyl alcohol  1 drop Both Eyes Daily   primidone  25 mg Oral Daily   tacrolimus  1 Application Topical BID   tamsulosin  0.4 mg Oral Daily   terbinafine   Topical Daily   Continuous Infusions:  azithromycin Stopped (05/14/22 0232)   cefTRIAXone (ROCEPHIN)  IV Stopped (05/13/22 1405)     LOS: 0 days    Time spent: 35 minutes    Dorcas Carrow, MD [facility] Pager [phone]

## 2022-05-15 DIAGNOSIS — Z7984 Long term (current) use of oral hypoglycemic drugs: Secondary | ICD-10-CM

## 2022-05-15 DIAGNOSIS — J690 Pneumonitis due to inhalation of food and vomit: Secondary | ICD-10-CM

## 2022-05-15 DIAGNOSIS — J189 Pneumonia, unspecified organism: Secondary | ICD-10-CM | POA: Diagnosis not present

## 2022-05-15 DIAGNOSIS — N1831 Chronic kidney disease, stage 3a: Secondary | ICD-10-CM

## 2022-05-15 DIAGNOSIS — C44321 Squamous cell carcinoma of skin of nose: Secondary | ICD-10-CM

## 2022-05-15 DIAGNOSIS — E1122 Type 2 diabetes mellitus with diabetic chronic kidney disease: Secondary | ICD-10-CM

## 2022-05-15 LAB — RESPIRATORY PANEL BY PCR

## 2022-05-15 LAB — BASIC METABOLIC PANEL
Anion gap: 9 (ref 5–15)
BUN: 34 mg/dL — ABNORMAL HIGH (ref 8–23)
CO2: 27 mmol/L (ref 22–32)
Calcium: 9.3 mg/dL (ref 8.9–10.3)
Chloride: 97 mmol/L — ABNORMAL LOW (ref 98–111)
Creatinine, Ser: 1.61 mg/dL — ABNORMAL HIGH (ref 0.61–1.24)
GFR, Estimated: 41 mL/min — ABNORMAL LOW (ref >60)
Glucose, Bld: 270 mg/dL — ABNORMAL HIGH (ref 70–99)
Potassium: 4.3 mmol/L (ref 3.5–5.1)
Sodium: 133 mmol/L — ABNORMAL LOW (ref 135–145)

## 2022-05-15 LAB — CBC
HCT: 30.9 % — ABNORMAL LOW (ref 39.0–52.0)
Hemoglobin: 10.4 g/dL — ABNORMAL LOW (ref 13.0–17.0)
MCH: 34.7 pg — ABNORMAL HIGH (ref 26.0–34.0)
MCHC: 33.7 g/dL (ref 30.0–36.0)
MCV: 103 fL — ABNORMAL HIGH (ref 80.0–100.0)
Platelets: 209 10*3/uL (ref 150–400)
RBC: 3 MIL/uL — ABNORMAL LOW (ref 4.22–5.81)
RDW: 13.2 % (ref 11.5–15.5)
WBC: 7.3 10*3/uL (ref 4.0–10.5)
nRBC: 0 % (ref 0.0–0.2)

## 2022-05-15 LAB — GLUCOSE, CAPILLARY
Glucose-Capillary: 146 mg/dL — ABNORMAL HIGH (ref 70–99)
Glucose-Capillary: 218 mg/dL — ABNORMAL HIGH (ref 70–99)
Glucose-Capillary: 189 mg/dL — ABNORMAL HIGH (ref 70–99)
Glucose-Capillary: 262 mg/dL — ABNORMAL HIGH (ref 70–99)

## 2022-05-15 LAB — PROCALCITONIN: Procalcitonin: 0.14 ng/mL

## 2022-05-15 LAB — LACTIC ACID, PLASMA
Lactic Acid, Venous: 2 mmol/L (ref 0.5–1.9)
Lactic Acid, Venous: 1.4 mmol/L (ref 0.5–1.9)

## 2022-05-15 MED ORDER — FUROSEMIDE 10 MG/ML IJ SOLN
20.0000 mg | Freq: Once | INTRAMUSCULAR | Status: AC
  Administered 2022-05-15: 20 mg via INTRAVENOUS
  Filled 2022-05-15: qty 2

## 2022-05-15 MED ORDER — SODIUM CHLORIDE 0.9 % IV SOLN: INTRAVENOUS | Status: DC

## 2022-05-15 MED ORDER — ACETAMINOPHEN 325 MG PO TABS
650.0000 mg | ORAL_TABLET | Freq: Four times a day (QID) | ORAL | Status: DC | PRN
  Administered 2022-05-17: 650 mg via ORAL
  Filled 2022-05-15: qty 2

## 2022-05-15 MED ORDER — GUAIFENESIN-DM 100-10 MG/5ML PO SYRP
5.0000 mL | ORAL_SOLUTION | ORAL | Status: DC | PRN
  Administered 2022-05-15 – 2022-05-18 (×2): 5 mL via ORAL
  Filled 2022-05-15 (×3): qty 10

## 2022-05-15 MED ORDER — OXYCODONE HCL 5 MG PO TABS: 5.0000 mg | ORAL_TABLET | Freq: Four times a day (QID) | ORAL | Status: DC | PRN

## 2022-05-15 MED ORDER — ACETAMINOPHEN 650 MG RE SUPP
650.0000 mg | Freq: Four times a day (QID) | RECTAL | Status: DC | PRN
  Administered 2022-05-16 – 2022-05-17 (×3): 650 mg via RECTAL
  Filled 2022-05-15 (×3): qty 1

## 2022-05-15 MED ORDER — DOXYCYCLINE HYCLATE 100 MG PO TABS
100.0000 mg | ORAL_TABLET | Freq: Two times a day (BID) | ORAL | Status: DC
Start: 2022-05-15 — End: 2022-05-16
  Administered 2022-05-15 (×2): 100 mg via ORAL
  Filled 2022-05-15 (×2): qty 1

## 2022-05-15 MED ORDER — LACTATED RINGERS IV BOLUS
500.0000 mL | Freq: Once | INTRAVENOUS | Status: AC
  Administered 2022-05-15: 500 mL via INTRAVENOUS

## 2022-05-15 MED ORDER — TACROLIMUS 0.1 % EX OINT
1.0000 | TOPICAL_OINTMENT | Freq: Two times a day (BID) | CUTANEOUS | Status: DC
Start: 2022-05-15 — End: 2022-05-21
  Administered 2022-05-18: 1 via TOPICAL

## 2022-05-15 MED ORDER — ACITRETIN 10 MG PO CAPS
10.0000 mg | ORAL_CAPSULE | Freq: Every day | ORAL | Status: DC
  Administered 2022-05-15 – 2022-05-18 (×2): 10 mg via ORAL

## 2022-05-15 MED ORDER — IPRATROPIUM-ALBUTEROL 0.5-2.5 (3) MG/3ML IN SOLN
3.0000 mL | Freq: Four times a day (QID) | RESPIRATORY_TRACT | Status: DC | PRN
  Administered 2022-05-16 – 2022-05-17 (×2): 3 mL via RESPIRATORY_TRACT
  Filled 2022-05-15 (×2): qty 3

## 2022-05-15 NOTE — Progress Notes (Signed)
OT Cancellation Note  Patient Details Name: George Barnett MRN: [id number] DOB: 08-Sep-1933   Cancelled Treatment:    Reason Eval/Treat Not Completed: Patient not medically ready Patients nurse asking for therapy to hold off at this time with patient's decreased O2 saturation on 4L/min at rest and shivers. OT to continue to follow and check back as schedule will allow.  Jackelyn Poling OTR/L, [place] Office# [phone] Pager# [phone]  05/15/2022, 2:02 PM

## 2022-05-15 NOTE — Consult Note (Addendum)
[facility] Nurse Consult Note: Reason for Consult: Consult requested to assist with chronic scalp lesions. Pt has a complex diagnosis of "pustular dermatosis" and has been followed by Dr Benson Norway of the dermatology team prior to admission. He has tried many different topical treatments to promote healing in the past, and most have been unsuccessful.  Everything that touches this location is very painful. His daughters are well-informed regarding the diagnosis and plan of care and shared the recent dermatology visit notes with me.  Dr Benson Norway saw the patient recently on 6/23 and has ordered Lidocaine 4% spray to be topically applied each day prior to the application of cream, but the patient has not started this treatment yet and has been using Bactine spray to decrease discomfort prior to admission. Posterior head with yellow full thickness lesions in patchy areas, small amt yellow drainage; affected area approx 20X20X.1cm.  Ordered prior to admission:  Clobetasol .05% ointment to scalp Tacrolimus .1% ointment to scalp  Nose lesion had a skin graft applied recently and this should not be disturbed until he returns for a follow-up visit with his dermatologist surgeon (Dr Fredric Mare). Removed outer dressing to assess the site.  A nonadherent dressing is intact to the left outer nare, approx .8X.8cm, dark brown, no odor or drainage, skin graft is unable to be visualized and is left in place.  Dressing procedure/placement/frequency: Discussed plan of care with pharmacy team. They do not carry Lidocaine spray. Discussed possible plan of care with 3 daughters in person and also on the phone, along with patient's input.  We have agreed on the following plan of care outlined below. Topical treatment orders provided for bedside nurses to perform as follows. Refer to topical treatment orders below:   1. Bedside nurse; please refer to these orders before application of Tacrolimus and Clobetasol creams BID: Family will bring in  [place] spray and baby shampoo from home. They can perform the procedure if desired. Follow these instructions for scalp BID: Moisten a washcloth with cold water and small amt baby shampoo, lay gently over scalp wounds, do not rub, then gently remove.  Use Bactine spray to the affected areas, then apply creams as ordered.  If patient cannot tolerate the cream application, then apply Vaseline gauze over the affected areas and leave in place without an outer dressing. 2. Leave dressing in place over nose wound, there is a skin graft which should not be disturbed until he sees dermatology as an outpatient.   Please re-consult if further assistance is needed.  Thank-you,  Julien Girt MSN, [place], [place], [place], [place]

## 2022-05-15 NOTE — Progress Notes (Addendum)
No change in mentation. Pt is a&ox4. He is eating. Pt reports dry cough intermittently with liquids. He describes the feeling as a "tickle" in his throat. Provider updated. Will continue to monitor.   05/15/22 1402  Assess: MEWS Score  Temp (!) 100.7 F (38.2 C)  BP (!) 151/62  MAP (mmHg) 90  Pulse Rate 78  Resp (!) 24  SpO2 90 %  O2 Device Nasal Cannula  O2 Flow Rate (L/min) 4 L/min  Assess: MEWS Score  MEWS Temp 1  MEWS Systolic 0  MEWS Pulse 0  MEWS RR 1  MEWS LOC 0  MEWS Score 2  MEWS Score Color Yellow  Assess: SIRS CRITERIA  SIRS Temperature  0  SIRS Pulse 0  SIRS Respirations  1  SIRS WBC 0  SIRS Score Sum  1

## 2022-05-15 NOTE — Consult Note (Addendum)
[facility] for Infectious Disease    Date of Admission:  05/12/2022     Reason for Consult: Fevers, pneumonia, scalp infection     Referring Physician: Dr Tyrell Antonio  Current antibiotics: Ceftriaxone and Azithromycin  ASSESSMENT:    88 y.o. male admitted with:  Suspected pneumonia: Patient is presenting with fever, cough, new oxygen requirement and radiographic changes suggestive of pneumonia.  His fever curve overall appears to be improved with a Tmax of 102 on 6/26.  He did have an elevated temp of 100.7 early this morning as well as this afternoon and his oxygen requirement appears stable at 2-4 liters via nasal cannula.  COVID and flu testing was negative on admission and PCT is low. Pustular dermatosis of the scalp: This is a longstanding issue for patient recalcitrant to many topical therapies followed by dermatology at [facility].  Concern has been raised about possible secondary infection of this area but unclear how much worse this is compared to baseline. Chronic kidney disease stage 3:  Creatinine is currently about baseline of 1.6, GFR 40. Type 2 DM: Well controlled on Jardiance and metformin at home. Skin cancer: Recent skin graft done to left nares.  Does not appear infected and should not be disturbed until follow up with outpatient dermatologist.   RECOMMENDATIONS:    Continue Ceftriaxone for CAP coverage Will change from azithromycin to doxycycline to add MRSA coverage for scalp and continue atypical coverage for CAP Check respiratory pathogen panel with concern for pneumonia, lives in assisted living, low procalcitonin and fevers Wound care evaluation appreciated Lab monitoring Glycemic control Will follow   Principal Problem:   CAP (community acquired pneumonia) Active Problems:   Type 2 diabetes mellitus with stage 3 chronic kidney disease, without long-term current use of insulin (HCC)   Erosive pustular dermatosis of scalp   Tremor of both hands-   both wiht rest and activity; acutely worse   Stage 3a chronic kidney disease (CKD) (HCC)   Squamous cell cancer of skin of nose   Weakness   Lower extremity weakness   Fever   MEDICATIONS:    Scheduled Meds:  acetaminophen  500 mg Oral Q6H   acitretin  10 mg Oral Q supper   aspirin EC  81 mg Oral QPM   atorvastatin  10 mg Oral QODAY   clobetasol ointment  1 Application Topical BID   doxycycline  100 mg Oral Q12H   enoxaparin (LOVENOX) injection  30 mg Subcutaneous Q24H   feeding supplement  237 mL Oral BID BM   gabapentin  100 mg Oral TID   insulin aspart  0-6 Units Subcutaneous TID WC   multivitamin with minerals  1 tablet Oral Daily   polyethylene glycol  17 g Oral Daily   polyvinyl alcohol  1 drop Both Eyes Daily   primidone  25 mg Oral Daily   tacrolimus  1 Application Topical BID   tamsulosin  0.4 mg Oral Daily   terbinafine   Topical Daily   Continuous Infusions:  sodium chloride     cefTRIAXone (ROCEPHIN)  IV 2 g (05/14/22 1324)   lactated ringers     PRN Meds:.oxyCODONE  HPI:    George Barnett is a 88 y.o. male with a complex medical history as outlined below.  In addition, he a has a diagnosis of pustular dermatosis of the scalp that he follows with dermatology at [facility]  for.  He has been on many different topical treatments for this condition  that have been ineffective to date.  He reports that this area is very painful with anything that touches the involved location.  He presented to [facility] on 05/12/22 with a chief complaint of lower extremity weakness.  There was report of his legs giving way but he did not fall.  There was no bowel or bladder incontinence.  These symptoms started approximately 3 days prior to his admission.  He also had a fever the night prior to admit.  Upon ED presentation, he had a non-focal exam.  COVID and flu testing was negative.  CXR did show a possible new pneumonia with patchy airspace disease in the left lower lung  concerning for infection.  He was initially on room air and afebrile but subsequently developed a Tmax of 102.7 on 6/26 at 4pm with new onset hypoxia requiring 2-3 liters via nasal cannula.  He also reports a new cough that is dry.  He was last febrile to 100.7 this morning at 1am.  He has been on ceftriaxone and azithromycin since admission to treat presumably for CAP.  There was concern as well for secondary infection of his pustular dermatosis.  They have stopped applying topical treatments to the scalp while he has been here but there is concern that maybe his scalp his more erythematous than usual with more crusting.  The pain seems slightly improved with holding off on topical treatments as these have primarily been irritating.     Patient has no evidence of leukocytosis at this time and he also underwent MRI thoracic and lumbar spine given his presenting complaint.  This imaging was overall reassuring and was felt to be due to progressive debility and weakness.  He also had recent Mohs surgery for a left sided skin cancer on his nose.  He was scheduled for follow up dressing change for this tomorrow.     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 George Barnett is a 88 y.o. male who is here for a follow up visit.  DM: He is taking metformin and glimepiride and tolerates these well. His A1C was 7.1 on recent labs.  HTN: He   Decreased activity 12/08/2019   Dehydration- mild 12/08/2019   Diabetes mellitus without complication (HCC)    Edema, peripheral 11/22/2014   Elbow effusion, left    Erosive pustular dermatosis of scalp 7/98/9211   Folliculitis- scalp; txed by Derm- Dr Jarome Matin 07/27/2018   Frequency of urination    GAD (generalized anxiety disorder) 12/10/2019   GERD (gastroesophageal reflux disease)    Glossodynia 05/05/2017   [name], MD - 11/22/2016 2:15 PM EST Formatting of this note may be  different from the original. Otolaryngology Initial Consultation Note  Requesting Attending Physician: [name], MD Service Requesting Consult: No service for patient encounter.  History of Present Illness:   The patient is being seen in consultation with [name], MD for the evaluation of tongu   Heart attack [facility])    Heart disease    High risk medication use 01/15/2019   Hypertension    Hypertension associated with diabetes ([place]) 08/05/2017   Microalbuminuria due to type 2 diabetes mellitus ([place]) 08/05/2017   Mixed diabetic hyperlipidemia associated with type 2 diabetes mellitus ([place]) 08/05/2017   Orthostatic hypotension 11/05/2018   Primary osteoarthritis of right shoulder 01/24/2015   Prostate enlargement    Recurrent right inguinal hernia 05/18/2019   RLS (restless legs syndrome) 06/17/2017   Snoring 03/25/2017   Stage 3 chronic kidney disease ([place]) 04/22/2017   Stage 4 chronic kidney disease ([place]) 09/08/2020   Stress and adjustment reaction 01/12/2020   Tremor of both hands-  both wiht rest and activity; acutely worse 11/27/2018   Tremors of nervous system    Type 2 diabetes mellitus with stage 3 chronic kidney disease, without long-term current use of insulin ([place])  05/05/2017   Urinary retention due to benign prostatic hyperplasia 01/25/2015   Venous insufficiency of both lower extremities 11/27/2018   White coat syndrome with diagnosis of hypertension 03/10/2020    Social History   Tobacco Use   Smoking status: Never   Smokeless tobacco: Never  Vaping Use   Vaping Use: Never used  Substance Use Topics   Alcohol use: No   Drug use: No    Family History  Problem Relation Age of Onset   Hypertension Brother     Allergies  Allergen Reactions   Exenatide Nausea And Vomiting    [place]    Nadolol Other (See Comments)    Numbness in fingers   Sitagliptin Phosphate [Sitagliptin] Other (See Comments)    Weight loss     Review of Systems  All other systems reviewed and are negative. Except as noted above in the HPI.   OBJECTIVE:   Blood pressure (!) 116/55, pulse 69, temperature 98.1 F (36.7 C), temperature source Oral, resp. rate 16, height 6' (1.829 m), weight 63.2 kg, SpO2 91 %. Body mass index is 18.91 kg/m.  Physical Exam Constitutional:      Comments: Elderly man, lying in hospital bed, daughter and grand daughter at the bedside.  HENT:     Head:     Comments: Pustular lesions noted on scalp with yellow crusting and erythema.  Difficult to discern if any of this is acutely worsened.  There is a small amount of yellowish drainage.     Nose:     Comments: Left nares has a small, dark brown dressing over skin graft site.  No drainage or odor.    Mouth/Throat:     Mouth: Mucous membranes are moist.     Pharynx: Oropharynx is clear.  Eyes:     Extraocular Movements: Extraocular movements intact.     Conjunctiva/sclera: Conjunctivae normal.  Pulmonary:     Effort: Pulmonary effort is normal. No respiratory distress.     Comments: On nasal cannula.  Abdominal:     General: There is no distension.     Palpations: Abdomen is soft.  Musculoskeletal:     Cervical back: Normal range of motion and neck supple.     Right lower  leg: No edema.     Left lower leg:  No edema.  Skin:    General: Skin is warm and dry.  Neurological:     General: No focal deficit present.     Mental Status: He is oriented to person, place, and time.     Comments: He has bilateral upper extremity tremor.   Psychiatric:        Mood and Affect: Mood normal.        Behavior: Behavior normal.      Lab Results: Lab Results  Component Value Date   WBC 7.3 05/15/2022   HGB 10.4 (L) 05/15/2022   HCT 30.9 (L) 05/15/2022   MCV 103.0 (H) 05/15/2022   PLT 209 05/15/2022    Lab Results  Component Value Date   NA 133 (L) 05/15/2022   K 4.3 05/15/2022   CO2 27 05/15/2022   GLUCOSE 270 (H) 05/15/2022   BUN 34 (H) 05/15/2022   CREATININE 1.61 (H) 05/15/2022   CALCIUM 9.3 05/15/2022   GFRNONAA 41 (L) 05/15/2022   GFRAA 58 (L) 09/06/2020    Lab Results  Component Value Date   ALT 12 05/13/2022   AST 16 05/13/2022   ALKPHOS 35 (L) 05/13/2022   BILITOT 0.6 05/13/2022    No results found for: "CRP"  No results found for: "ESRSEDRATE"  I have reviewed the micro and lab results in Epic.  Imaging: No results found.   Imaging independently reviewed in Epic.  Raynelle [facility] for Infectious Disease [facility] [phone] pager 05/15/2022, 1:59 PM

## 2022-05-15 NOTE — Progress Notes (Signed)
PROGRESS NOTE    George Barnett  LKG:[id number] DOB: 1933/07/11 DOA: 05/12/2022 PCP: Haydee Salter, MD   Brief Narrative: 88 year old with past medical history significant for diabetes, hypertension, CAD, chronic and recalcitrant erosive posterior dermatosis of the scalp on extensive treatment presented with increased weakness of both lower extremity and episode of fevers.  Neuro exam is nonfocal.  Chest x-ray showed left lower lobe atelectasis.  COVID and influenza negative.  Admitted with persistent fevers and symptoms.  Patient continued to spike fever despite being on Tylenol.  ID consulted for further evaluation of scalp dermatosis and concern for superimposed infection.   Assessment & Plan:   Principal Problem:   Weakness Active Problems:   Type 2 diabetes mellitus with stage 3 chronic kidney disease, without long-term current use of insulin (HCC)   CAP (community acquired pneumonia)   Lower extremity weakness   Fever  1-Bilateral lower extremity weakness: Suspect related to debility and generalized weakness. He was able to ambulate in the hall.  No significant focal weakness. MRI thoracic and lumbar spine reviewed with neurosurgeon on-call, no significant nerve  encroachment, compression.  Continue with PT>   2-Fever: Presumed pneumonia Also concern for superimposed cellulitis of the scalp ID consulted. Continue with IV ceftriaxone, azithromycin changed to doxycycline to cover for cellulitis of the scalp Blood cultures negative x2 days Repeat lactic acid. Received IV bolus.   3-Chronic recalcitrant Erosive Pustular Dermatosis of the scalp; Patient has had this problem for more than 3 years. He follows at [facility] with dermatologist. Attending physician on 6/27 spoke with dermatologist from [facility]. Recommendation was to continue acitretin, as this is less likely to contribute to current presentation. Plan to discontinue dapsone as it may cause weakness. She  recommended continued topical treatment with tacrolimus and clobetasol. Patient does not wishes at this point to continue with topical treatment as it is very painful.  I offered oxycodone as needed for pain. ID consulted for evaluation of ? Superimpose cellulitis.  Wound care consulted.  4-Chronic iron deficiency anemia: Stable  Diabetes type 2: Continue with a sliding scale.  Hold metformin and Jardiance.  Prostatism;  He had some urinary retention. Started on Flomax.  Will continue with bladder scan.   AKI on CKD stage IIIa Hold Cozaar.  Started IV fluids.  Strict I and . Bladder scan.  Hyponatremia; started on IV fluids.   Essential tremors, continue with primidone   Skin cancer: Has a skin graft procedure done, do not remove dressing. Wound care consulted   Nutrition Problem: Increased nutrient needs Etiology: acute illness    Signs/Symptoms: estimated needs    Interventions: Ensure Enlive (each supplement provides 350kcal and 20 grams of protein), MVI, Liberalize Diet  Estimated body mass index is 18.91 kg/m as calculated from the following:   Height as of this encounter: 6' (1.829 m).   Weight as of this encounter: 63.2 kg.   DVT prophylaxis:  Code Status: DNR, per patient and daughter who was at bedside.  Family Communication: Daughter at bedside.  Disposition Plan:  Status is: Inpatient Remains inpatient appropriate because: fever    Consultants:  ID  Procedures:  None  Antimicrobials:    Subjective: He is not feeling well. He report weakness, unable to ambulate as he would like.  Family with concern for scalp dermatitis infection. ID was consulted.    Objective: Vitals:   05/14/22 1934 05/15/22 0131 05/15/22 0651 05/15/22 1002  BP: (!) 107/53  137/64 (!) 116/55  Pulse: 73  75 69  Resp: '20  18 16  '$ Temp: 100.2 F (37.9 C) (!) 100.7 F (38.2 C) 99.7 F (37.6 C) 98.1 F (36.7 C)  TempSrc: Oral Oral Oral Oral  SpO2: 93%  90% 91%   Weight:      Height:        Intake/Output Summary (Last 24 hours) at 05/15/2022 1203 Last data filed at 05/15/2022 0725 Gross per 24 hour  Intake 480 ml  Output 950 ml  Net -470 ml   Filed Weights   05/12/22 2203 05/13/22 0510  Weight: 65.3 kg 63.2 kg    Examination:  General exam: Appears calm and comfortable  Respiratory system: Clear to auscultation. Respiratory effort normal. Cardiovascular system: S1 & S2 heard, RRR. No JVD, murmurs, rubs, gallops or clicks. No pedal edema. Gastrointestinal system: Abdomen is nondistended, soft and nontender. No organomegaly or masses felt. Normal bowel sounds heard. Central nervous system: Alert and oriented. No focal neurological deficits. Extremities: Symmetric 5 x 5 power. Skin: No rashes, lesions or ulcers Psychiatry: Judgement and insight appear normal. Mood & affect appropriate.     Data Reviewed: I have personally reviewed following labs and imaging studies  CBC: Recent Labs  Lab 05/12/22 2326 05/13/22 0552 05/13/22 0619 05/15/22 8032  WBC 7.4 DUPLICATE REQUEST 7.2 7.3  NEUTROABS 5.4 DUPLICATE REQUEST 4.7  --   HGB 12.2* DUPLICATE REQUEST 48.2* 50.0*  HCT 37.0* DUPLICATE REQUEST 48.8* 30.9*  MCV 891.6* DUPLICATE REQUEST 945.0* 388.8*  PLT 280 DUPLICATE REQUEST 034 917   Basic Metabolic Panel: Recent Labs  Lab 05/12/22 2326 05/13/22 0552 05/15/22 1119  NA 138 138 133*  K 4.4 4.2 4.3  CL 102 103 97*  CO2 '27 27 27  '$ GLUCOSE 159* 120* 270*  BUN 41* 37* 34*  CREATININE 1.64* 1.62* 1.61*  CALCIUM 9.5 8.9 9.3   GFR: Estimated Creatinine Clearance: 28.9 mL/min (A) (by C-G formula based on SCr of 1.61 mg/dL (H)). Liver Function Tests: Recent Labs  Lab 05/12/22 2326 05/13/22 0552  AST 16 16  ALT 14 12  ALKPHOS 39 35*  BILITOT 0.7 0.6  PROT 6.0* 5.5*  ALBUMIN 3.1* 2.8*   No results for input(s): "LIPASE", "AMYLASE" in the last 168 hours. No results for input(s): "AMMONIA" in the last 168 hours. Coagulation  Profile: Recent Labs  Lab 05/12/22 2326  INR 1.1   Cardiac Enzymes: Recent Labs  Lab 05/13/22 0552  CKTOTAL 32*   BNP (last 3 results) No results for input(s): "PROBNP" in the last 8760 hours. HbA1C: No results for input(s): "HGBA1C" in the last 72 hours. CBG: Recent Labs  Lab 05/14/22 0751 05/14/22 1211 05/14/22 1736 05/14/22 2156 05/15/22 0749  GLUCAP 156* 249* 232* 154* 146*   Lipid Profile: No results for input(s): "CHOL", "HDL", "LDLCALC", "TRIG", "CHOLHDL", "LDLDIRECT" in the last 72 hours. Thyroid Function Tests: Recent Labs    05/12/22 2326  TSH 1.331   Anemia Panel: Recent Labs    05/13/22 0552 05/13/22 0615  VITAMINB12 313  --   FOLATE 13.3  --   FERRITIN 282  --   TIBC 196*  --   IRON 24*  --   RETICCTPCT DUPLICATE REQUEST 1.1   Sepsis Labs: Recent Labs  Lab 05/13/22 0100 05/13/22 0325  LATICACIDVEN 1.1 1.0    Recent Results (from the past 240 hour(s))  Urine Culture     Status: None   Collection Time: 05/12/22 11:26 PM   Specimen: In/Out Cath Urine  Result Value Ref Range Status  Specimen Description   Final    IN/OUT CATH URINE Performed at [facility] [address] [place], [place] [address]    Special Requests   Final    NONE Performed at [facility], [place] [address], [place], [place] [address]    Culture   Final    NO GROWTH Performed at [facility], [place] [address], [place], [place] [address]    Report Status 05/14/2022 FINAL  Final  Resp Panel by RT-PCR (Flu A&B, Covid) Anterior Nasal Swab     Status: None   Collection Time: 05/12/22 11:26 PM   Specimen: Anterior Nasal Swab  Result Value Ref Range Status   SARS Coronavirus 2 by RT PCR NEGATIVE NEGATIVE Final    Comment: (NOTE) SARS-CoV-2 target nucleic acids are NOT DETECTED.  The SARS-CoV-2 RNA is generally detectable in upper respiratory specimens during the acute phase of infection. The lowest concentration of  SARS-CoV-2 viral copies this assay can detect is 138 copies/mL. A negative result does not preclude SARS-Cov-2 infection and should not be used as the sole basis for treatment or other patient management decisions. A negative result may occur with  improper specimen collection/handling, submission of specimen other than nasopharyngeal swab, presence of viral mutation(s) within the areas targeted by this assay, and inadequate number of viral copies(<138 copies/mL). A negative result must be combined with clinical observations, patient history, and epidemiological information. The expected result is Negative.  Fact Sheet for Patients:  EntrepreneurPulse.com.au  Fact Sheet for Healthcare Providers:  IncredibleEmployment.be  This test is no t yet approved or cleared by the [place] FDA and  has been authorized for detection and/or diagnosis of SARS-CoV-2 by FDA under an Emergency Use Authorization (EUA). This EUA will remain  in effect (meaning this test can be used) for the duration of the COVID-19 declaration under Section 564(b)(1) of the Act, 21 U.S.C.section 360bbb-3(b)(1), unless the authorization is terminated  or revoked sooner.       Influenza A by PCR NEGATIVE NEGATIVE Final   Influenza B by PCR NEGATIVE NEGATIVE Final    Comment: (NOTE) The Xpert Xpress SARS-CoV-2/FLU/RSV plus assay is intended as an aid in the diagnosis of influenza from Nasopharyngeal swab specimens and should not be used as a sole basis for treatment. Nasal washings and aspirates are unacceptable for Xpert Xpress SARS-CoV-2/FLU/RSV testing.  Fact Sheet for Patients: EntrepreneurPulse.com.au  Fact Sheet for Healthcare Providers: IncredibleEmployment.be  This test is not yet approved or cleared by the [place] FDA and has been authorized for detection and/or diagnosis of SARS-CoV-2 by FDA under an Emergency Use  Authorization (EUA). This EUA will remain in effect (meaning this test can be used) for the duration of the COVID-19 declaration under Section 564(b)(1) of the Act, 21 U.S.C. section 360bbb-3(b)(1), unless the authorization is terminated or revoked.  Performed at [facility], [place] [address], [place], [place] [address]   Blood culture (routine x 2)     Status: None (Preliminary result)   Collection Time: 05/13/22  1:00 AM   Specimen: BLOOD  Result Value Ref Range Status   Specimen Description   Final    BLOOD RIGHT ANTECUBITAL Performed at [facility] [address] [place],  [address]    Special Requests   Final    BOTTLES DRAWN AEROBIC AND ANAEROBIC Blood Culture results may not be optimal due to an excessive volume of blood received in culture bottles Performed at [facility]  [place] [address], [place], [place] [address]    Culture   Final    NO GROWTH 2 DAYS Performed at [facility] [address] [place], [place] [address]    Report Status PENDING  Incomplete  Blood culture (routine x 2)     Status: None (Preliminary result)   Collection Time: 05/13/22  5:52 AM   Specimen: BLOOD  Result Value Ref Range Status   Specimen Description   Final    BLOOD RIGHT ANTECUBITAL Performed at [facility] [address] [place], [place] [address]    Special Requests   Final    BOTTLES DRAWN AEROBIC AND ANAEROBIC Blood Culture adequate volume Performed at [facility] [address], [place], [place] [address]    Culture   Final    NO GROWTH 2 DAYS Performed at [facility] [address], [place], [place] [address]    Report Status PENDING  Incomplete         Radiology Studies: No results found.      Scheduled Meds:  acetaminophen  500 mg Oral Q6H   acitretin  10 mg Oral Q supper   aspirin EC  81 mg Oral QPM   atorvastatin  10 mg Oral QODAY    clobetasol ointment  1 Application Topical BID   enoxaparin (LOVENOX) injection  30 mg Subcutaneous Q24H   feeding supplement  237 mL Oral BID BM   gabapentin  100 mg Oral TID   insulin aspart  0-6 Units Subcutaneous TID WC   multivitamin with minerals  1 tablet Oral Daily   polyethylene glycol  17 g Oral Daily   polyvinyl alcohol  1 drop Both Eyes Daily   primidone  25 mg Oral Daily   tacrolimus  1 Application Topical BID   tamsulosin  0.4 mg Oral Daily   terbinafine   Topical Daily   Continuous Infusions:  azithromycin 500 mg (05/15/22 0134)   cefTRIAXone (ROCEPHIN)  IV 2 g (05/14/22 1324)     LOS: 1 day    Time spent: 42 minutes    Annaleise Burger A Tanaysha Alkins, MD [facility]   If 7PM-7AM, please contact night-coverage [email or web address]  05/15/2022, 12:03 PM

## 2022-05-15 NOTE — Progress Notes (Incomplete)
Rapid Response Event Note   Reason for Call : Respiratory distress   Initial Focused Assessment: On arrival, pt surrounded by staff, pt daughter in room. Pt  O2 need increased from 4L to 15L HFNC w/ pt still sating low 80s w/ accessory muscle use. Pt originally admitted w/ PNA.  R & L lung fields are clear in the front and L posterior crackles & R posterior rhonchi.  While in the room pt showed signs of slight aspiration while taking tylenol PO for his fever.   Interventions: Pt switched to NRB '20mg'$  IV Lasix, PO tylenol given, CXR ordered for routine in the morning, PRN Dounebs added  Plan of Care:  Reevaluate after 0000  MD Notified:  Call Time: Arrival Time: End Time:  Quincy Simmonds, RN

## 2022-05-15 NOTE — Progress Notes (Signed)
Rapid Response Event Note   Reason for Call : Respiratory distress   Initial Focused Assessment: On arrival, pt surrounded by staff, pt daughter in room. Pt trebling violently, covered in multiple blankets. Pt has hx of tremors - fever and O2 need have probably exacerbated the tremors.  Pt very hot to touch. O2 need increased from 4L to 15L HFNC w/ pt still sating low 80s w/ accessory muscle use. Pt originally admitted w/ PNA.  R & L lung fields are clear in the front and L posterior crackles & R posterior rhonchi.  While in the room pt showed signs of slight aspiration while taking tylenol PO for his fever.   Interventions: Pt switched to NRB '20mg'$  IV Lasix, PO tylenol given, CXR ordered for routine in the morning, PRN Dounebs added  Plan of Care:  Reevaluate after 0000  MD Notified: Clarene Essex, NP Call Time: Arrival Time: [id number] End Time: Ruth, RN

## 2022-05-16 ENCOUNTER — Inpatient Hospital Stay (HOSPITAL_COMMUNITY): Payer: Medicare Other

## 2022-05-16 DIAGNOSIS — L988 Other specified disorders of the skin and subcutaneous tissue: Secondary | ICD-10-CM | POA: Diagnosis not present

## 2022-05-16 DIAGNOSIS — R9431 Abnormal electrocardiogram [ECG] [EKG]: Secondary | ICD-10-CM | POA: Diagnosis not present

## 2022-05-16 DIAGNOSIS — Z515 Encounter for palliative care: Secondary | ICD-10-CM | POA: Diagnosis not present

## 2022-05-16 DIAGNOSIS — R531 Weakness: Secondary | ICD-10-CM | POA: Diagnosis not present

## 2022-05-16 DIAGNOSIS — Z7189 Other specified counseling: Secondary | ICD-10-CM | POA: Diagnosis not present

## 2022-05-16 DIAGNOSIS — J189 Pneumonia, unspecified organism: Secondary | ICD-10-CM | POA: Diagnosis not present

## 2022-05-16 DIAGNOSIS — L8990 Pressure ulcer of unspecified site, unspecified stage: Secondary | ICD-10-CM | POA: Insufficient documentation

## 2022-05-16 DIAGNOSIS — A419 Sepsis, unspecified organism: Secondary | ICD-10-CM

## 2022-05-16 LAB — MRSA NEXT GEN BY PCR, NASAL: MRSA by PCR Next Gen: NOT DETECTED

## 2022-05-16 LAB — BLOOD GAS, ARTERIAL
Acid-Base Excess: 4.8 mmol/L — ABNORMAL HIGH (ref 0.0–2.0)
Bicarbonate: 28.1 mmol/L — ABNORMAL HIGH (ref 20.0–28.0)
O2 Saturation: 90.7 %
Patient temperature: 38.7
pCO2 arterial: 39 mmHg (ref 32–48)
pH, Arterial: 7.47 — ABNORMAL HIGH (ref 7.35–7.45)
pO2, Arterial: 70 mmHg — ABNORMAL LOW (ref 83–108)

## 2022-05-16 LAB — BASIC METABOLIC PANEL
Anion gap: 9 (ref 5–15)
BUN: 30 mg/dL — ABNORMAL HIGH (ref 8–23)
CO2: 29 mmol/L (ref 22–32)
Calcium: 9.5 mg/dL (ref 8.9–10.3)
Chloride: 99 mmol/L (ref 98–111)
Creatinine, Ser: 1.57 mg/dL — ABNORMAL HIGH (ref 0.61–1.24)
GFR, Estimated: 42 mL/min — ABNORMAL LOW (ref >60)
Glucose, Bld: 198 mg/dL — ABNORMAL HIGH (ref 70–99)
Potassium: 4.3 mmol/L (ref 3.5–5.1)
Sodium: 137 mmol/L (ref 135–145)

## 2022-05-16 LAB — ECHOCARDIOGRAM COMPLETE
AR max vel: 2.9 cm2
AV Peak grad: 8.7 mmHg
Ao pk vel: 1.48 m/s
Area-P 1/2: 2.96 cm2
Height: 72 in
S' Lateral: 1.3 cm
Weight: 2285.73 oz

## 2022-05-16 LAB — STREP PNEUMONIAE URINARY ANTIGEN: Strep Pneumo Urinary Antigen: NEGATIVE

## 2022-05-16 LAB — MAGNESIUM: Magnesium: 1.7 mg/dL (ref 1.7–2.4)

## 2022-05-16 LAB — GLUCOSE, CAPILLARY
Glucose-Capillary: 192 mg/dL — ABNORMAL HIGH (ref 70–99)
Glucose-Capillary: 193 mg/dL — ABNORMAL HIGH (ref 70–99)
Glucose-Capillary: 150 mg/dL — ABNORMAL HIGH (ref 70–99)
Glucose-Capillary: 140 mg/dL — ABNORMAL HIGH (ref 70–99)

## 2022-05-16 LAB — CBC
HCT: 31.8 % — ABNORMAL LOW (ref 39.0–52.0)
Hemoglobin: 10.9 g/dL — ABNORMAL LOW (ref 13.0–17.0)
MCH: 34.7 pg — ABNORMAL HIGH (ref 26.0–34.0)
MCHC: 34.3 g/dL (ref 30.0–36.0)
MCV: 101.3 fL — ABNORMAL HIGH (ref 80.0–100.0)
Platelets: 236 10*3/uL (ref 150–400)
RBC: 3.14 MIL/uL — ABNORMAL LOW (ref 4.22–5.81)
RDW: 13.2 % (ref 11.5–15.5)
WBC: 8 10*3/uL (ref 4.0–10.5)
nRBC: 0 % (ref 0.0–0.2)

## 2022-05-16 LAB — BRAIN NATRIURETIC PEPTIDE: B Natriuretic Peptide: 50.8 pg/mL (ref 0.0–100.0)

## 2022-05-16 MED ORDER — SODIUM CHLORIDE 0.9 % IV SOLN
3.0000 g | Freq: Four times a day (QID) | INTRAVENOUS | Status: DC
  Administered 2022-05-16 – 2022-05-17 (×5): 3 g via INTRAVENOUS
  Filled 2022-05-16 (×6): qty 8

## 2022-05-16 MED ORDER — FUROSEMIDE 10 MG/ML IJ SOLN
20.0000 mg | Freq: Once | INTRAMUSCULAR | Status: AC
Start: 2022-05-16 — End: 2022-05-16
  Administered 2022-05-16: 20 mg via INTRAVENOUS

## 2022-05-16 MED ORDER — SODIUM CHLORIDE 0.9 % IV BOLUS
500.0000 mL | Freq: Once | INTRAVENOUS | Status: AC
  Administered 2022-05-16: 1000 mL via INTRAVENOUS

## 2022-05-16 MED ORDER — FUROSEMIDE 10 MG/ML IJ SOLN
INTRAMUSCULAR | Status: AC
  Filled 2022-05-16: qty 2

## 2022-05-16 MED ORDER — PANTOPRAZOLE SODIUM 40 MG IV SOLR
40.0000 mg | Freq: Two times a day (BID) | INTRAVENOUS | Status: DC
  Administered 2022-05-16 – 2022-05-19 (×6): 40 mg via INTRAVENOUS
  Filled 2022-05-16 (×6): qty 10

## 2022-05-16 MED ORDER — ORAL CARE MOUTH RINSE
15.0000 mL | OROMUCOSAL | Status: DC
  Administered 2022-05-16 – 2022-05-18 (×6): 15 mL via OROMUCOSAL

## 2022-05-16 MED ORDER — ORAL CARE MOUTH RINSE: 15.0000 mL | OROMUCOSAL | Status: DC | PRN

## 2022-05-16 MED ORDER — IPRATROPIUM-ALBUTEROL 0.5-2.5 (3) MG/3ML IN SOLN
3.0000 mL | Freq: Two times a day (BID) | RESPIRATORY_TRACT | Status: DC
Start: 2022-05-16 — End: 2022-05-18
  Administered 2022-05-16 – 2022-05-17 (×4): 3 mL via RESPIRATORY_TRACT
  Filled 2022-05-16 (×5): qty 3

## 2022-05-16 MED ORDER — FLUTICASONE PROPIONATE 50 MCG/ACT NA SUSP
1.0000 | Freq: Every day | NASAL | Status: DC
  Administered 2022-05-16 – 2022-05-19 (×3): 1 via NASAL
  Filled 2022-05-16: qty 16

## 2022-05-16 MED ORDER — MAGNESIUM SULFATE 2 GM/50ML IV SOLN
2.0000 g | Freq: Once | INTRAVENOUS | Status: AC
Start: 2022-05-16 — End: 2022-05-16
  Administered 2022-05-16: 2 g via INTRAVENOUS
  Filled 2022-05-16: qty 50

## 2022-05-16 MED ORDER — CHLORHEXIDINE GLUCONATE CLOTH 2 % EX PADS
6.0000 | MEDICATED_PAD | Freq: Every day | CUTANEOUS | Status: DC
  Administered 2022-05-17: 6 via TOPICAL

## 2022-05-16 MED ORDER — SODIUM CHLORIDE 0.9 % IV SOLN: 100.0000 mg | Freq: Two times a day (BID) | INTRAVENOUS | Status: DC

## 2022-05-16 NOTE — Progress Notes (Signed)
Speech Language Pathology Treatment: Dysphagia  Patient Details Name: George Barnett MRN: [id number] DOB: January 27, 1933 Today's Date: 05/16/2022 Time: 1500-1509 SLP Time Calculation (min) (ACUTE ONLY): 9 min  Assessment / Plan / Recommendation Clinical Impression  Reviewed findings of MBS with pt's family including two daughters and his wife showing minimal oropharyngeal dypshagia; Showed family several flouro loops from swallow study.  Advised no aspiration occured and chin tuck posture with small sips helpful for airway protection.  Family also viewed loop of pt's esophagus that showed barium residual without his sensation, advised that SLP can not diagnose in the esophagus. Daughter asked if esophagram would show Achalasia - and SLP advised that it should if present.  Pt's wife reports pt belches with intake and has had to expectorate food during a meal - but only a few times. Pt states he has adjusted his eating habits by eating slower - causing SLP to suspect component of chronic esophageal deficits for which he is compensating.  Wife denies pt having issues for six months, states it "has not been that long".  MD approved pt to have few sips ONLY  - no oral medications.  SLP encouraged sitting fully upright with chin down posture and small sips and stay upright after intake.   Pt reports being uncomfortable staying in the same position.  SLP will follow up for dysphagia management.  Family and pt had no questions and reported understanding to information.    HPI HPI: Per palliative note "88 year old gentleman admitted with suspected pneumonia, pustular dermatosis of scalp, has underlying stage III chronic kidney disease, history of diabetes and skin cancer."  Pt with respiratory decline and required increased oxygen. Per notes, pt has h/o reporting soreness of the left side of his tongue, CT image negative.    CXR concerning as it shows "Increasing patchy infiltration in both mid and lower lung zones,   likely multifocal pneumonia. Edema less likely. Small pleural effusions." Today pt on 5 Liters and stable per RN.  MBS was ordered by NP.      SLP Plan  Continue with current plan of care      Recommendations for follow up therapy are one component of a multi-disciplinary discharge planning process, led by the attending physician.  Recommendations may be updated based on patient status, additional functional criteria and insurance authorization.    Recommendations  Diet recommendations:  (few sips only per md) Liquids provided via: Straw Medication Administration: Via alternative means Compensations: Slow rate;Small sips/bites Postural Changes and/or Swallow Maneuvers: Chin tuck                Oral Care Recommendations: Oral care BID Follow Up Recommendations: Follow physician's recommendations for discharge plan and follow up therapies Assistance recommended at discharge: Frequent or constant Supervision/Assistance SLP Visit Diagnosis: Dysphagia, oropharyngeal phase (R13.12) Plan: Continue with current plan of care         George Lime, MS [place] Office [phone] Pager [phone]   George Barnett  05/16/2022, 3:32 PM

## 2022-05-16 NOTE — Progress Notes (Signed)
Patient's family member called this RN into room because patient was having complaints of coughing and shivering/tremors. Coughing noted to have started after night time medication administration with sips of water. Patient requested something be ordered for cough. RN placed order using general PRN medication order. RN auscultated patient lungs and obtained 02 sat. Lung sounds noted to be diminished on ausculation. Sat noted to be in low 60s-70s on 4L nasal cannula. RN started patient on 15L of nasal cannula and called rapid response. Charge nurse, on call provider, rapid response, and respiratory therapist, and patient family member at bedside at bedside. Care in progress.

## 2022-05-16 NOTE — Evaluation (Signed)
Occupational Therapy Evaluation Patient Details Name: George Barnett MRN: [id number] DOB: [birth year] Today's Date: 05/16/2022   History of Present Illness 88 y.o. male with complex medical history including stage IV CKD, diabetes mellitus, erosive pustular dermatosis of the scalp, hypertension, restless leg syndrome, and CAD who presents to the emergency department for evaluation of progressively worsening weakness.. Transfer to SDU due to respiratory distress 05/15/22   Clinical Impression   Patient is a 88 year old male who was admitted for above. Patient was living at [place] prior level. Currently, patient needs +2 assist for transfers and max A for dressing/bathing tasks with decreased functional activity tolerance. Patient would continue to benefit from skilled OT services at this time while admitted and after d/c to address noted deficits in order to improve overall safety and independence in ADLs.       Recommendations for follow up therapy are one component of a multi-disciplinary discharge planning process, led by the attending physician.  Recommendations may be updated based on patient status, additional functional criteria and insurance authorization.   Follow Up Recommendations  Skilled nursing-short term rehab (<3 hours/day)    Assistance Recommended at Discharge Frequent or constant Supervision/Assistance  Patient can return home with the following A lot of help with walking and/or transfers;A lot of help with bathing/dressing/bathroom;Assistance with cooking/housework;Direct supervision/assist for financial management;Assist for transportation;Help with stairs or ramp for entrance;Direct supervision/assist for medications management    Functional Status Assessment  Patient has had a recent decline in their functional status and demonstrates the ability to make significant improvements in function in a reasonable and predictable amount of time.  Equipment Recommendations  Other  (comment) (defer to next venue)    Recommendations for Other Services       Precautions / Restrictions Precautions Precautions: Fall Precaution Comments: monitor  sats Restrictions Weight Bearing Restrictions: No      Mobility Bed Mobility Overal bed mobility: Needs Assistance       Supine to sit: Min assist, HOB elevated     General bed mobility comments: extra time, assist to scoot  forward    Transfers                          Balance Overall balance assessment: Needs assistance, History of Falls Sitting-balance support: Feet supported, Bilateral upper extremity supported Sitting balance-Leahy Scale: Fair     Standing balance support: Bilateral upper extremity supported, Reliant on assistive device for balance Standing balance-Leahy Scale: Poor                             ADL either performed or assessed with clinical judgement   ADL Overall ADL's : Needs assistance/impaired Eating/Feeding: Minimal assistance;Sitting   Grooming: Minimal assistance;Sitting   Upper Body Bathing: Minimal assistance;Sitting   Lower Body Bathing: Maximal assistance;Sit to/from stand;Sitting/lateral leans   Upper Body Dressing : Minimal assistance;Sitting   Lower Body Dressing: Maximal assistance;Sit to/from stand;Sitting/lateral leans   Toilet Transfer: +2 for physical assistance;+2 for safety/equipment;Maximal assistance Toilet Transfer Details (indicate cue type and reason): patient transfered from edge of bed to recliner in room with increased time with RW and +2 for safety with patient freezing halfway through transfer with reports of fatigue with increased physical assistance to make it into chair Toileting- Clothing Manipulation and Hygiene: Total assistance;Bed level       Functional mobility during ADLs: +2 for physical assistance;+2 for safety/equipment  Vision Patient Visual Report: No change from baseline       Perception      Praxis      Pertinent Vitals/Pain Pain Assessment Pain Assessment: Faces Faces Pain Scale: Hurts even more Pain Location: scalp Pain Descriptors / Indicators: Discomfort Pain Intervention(s): Monitored during session     Hand Dominance Right   Extremity/Trunk Assessment Upper Extremity Assessment Upper Extremity Assessment: Generalized weakness (able to move UE in ROM [facility] but no strength behind actions.)resting tremors with RUE more than LUE   Lower Extremity Assessment Lower Extremity Assessment: Defer to PT evaluation   Cervical / Trunk Assessment Cervical / Trunk Assessment: Other exceptions Cervical / Trunk Exceptions: cachectic appearance   Communication Communication Communication: No difficulties   Cognition Arousal/Alertness: Awake/alert Behavior During Therapy: WFL for tasks assessed/performed Overall Cognitive Status: Within Functional Limits for tasks assessed                                 General Comments: family in room noted patient was alittle more clear today than on 6/28 prior to transtion to ICU     General Comments       Exercises     Shoulder Instructions      Home Living Family/patient expects to be discharged to:: Skilled nursing facility Living Arrangements: Spouse/significant other Available Help at Discharge: Family;Available 24 hours/day Type of Home: Independent living facility       Home Layout: One level               Home Equipment: Conservation officer, nature (2 wheels);BSC/3in1          Prior Functioning/Environment Prior Level of Function : Independent/Modified Independent             Mobility Comments: pt typically independent, reports weakness and difficulty getting up after falling at home, states he has fallen numerous times lately due to legs giving out/weakness          OT Problem List: Impaired balance (sitting and/or standing);Decreased safety awareness;Decreased activity  tolerance;Cardiopulmonary status limiting activity;Decreased knowledge of precautions;Decreased knowledge of use of DME or AE      OT Treatment/Interventions: Self-care/ADL training;Therapeutic exercise;Neuromuscular education;Energy conservation;DME and/or AE instruction;Therapeutic activities;Balance training;Patient/family education    OT Goals(Current goals can be found in the care plan section) Acute Rehab OT Goals Patient Stated Goal: to get out of bed OT Goal Formulation: With patient/family Time For Goal Achievement: 05/30/22 Potential to Achieve Goals: Fair  OT Frequency: Min 2X/week    Co-evaluation PT/OT/SLP Co-Evaluation/Treatment: Yes Reason for Co-Treatment: For patient/therapist safety;To address functional/ADL transfers PT goals addressed during session: Mobility/safety with mobility OT goals addressed during session: ADL's and self-care      AM-PAC OT "6 Clicks" Daily Activity     Outcome Measure Help from another person eating meals?: A Little Help from another person taking care of personal grooming?: A Little Help from another person toileting, which includes using toliet, bedpan, or urinal?: A Lot Help from another person bathing (including washing, rinsing, drying)?: A Lot Help from another person to put on and taking off regular upper body clothing?: A Lot Help from another person to put on and taking off regular lower body clothing?: A Lot 6 Click Score: 14   End of Session Equipment Utilized During Treatment: Gait belt;Rolling walker (2 wheels);Oxygen Nurse Communication: Other (comment) (nurse in room)  Activity Tolerance: Patient tolerated treatment well Patient left: in chair;with call bell/phone  within reach;with nursing/sitter in room;with family/visitor present  OT Visit Diagnosis: Unsteadiness on feet (R26.81);Other abnormalities of gait and mobility (R26.89);Muscle weakness (generalized) (M62.81);Pain                Time: 3582-5189 OT Time  Calculation (min): 14 min Charges:  OT General Charges $OT Visit: 1 Visit OT Evaluation $OT Eval Moderate Complexity: 1 Mod  Jackelyn Poling OTR/L, MS Acute Rehabilitation Department Office# [phone] Pager# [phone]   Marcellina Millin 05/16/2022, 9:59 AM

## 2022-05-16 NOTE — Progress Notes (Signed)
[facility] for Infectious Disease  Date of Admission:  05/12/2022           Reason for visit: Follow up on pneumonia  Current antibiotics: Ceftriaxone Doxycycline     ASSESSMENT:    88 y.o. male admitted with:  #Community-acquired pneumonia: Suspected aspiration.  Noted to have worsening respiratory distress overnight with Tmax 101.7.  Respiratory viral panel negative.  Repeat chest x-ray shows concerns for multifocal pneumonia and RN has noted concerns for aspiration.  His oxygen requirement is improving this morning and he looks relatively stable today.  His volume status is net negative this admission and BNP normal suggesting that respiratory issues are not due to heart failure.  #Pustular dermatosis of the scalp: Longstanding issue for patient which has been recalcitrant to many topical therapies.  There was some concern yesterday about possible secondary infection, however, I think much of his clinical presentation is being driven by pneumonia.  #CKD stage III: Creatinine stable today.  #Sepsis: Secondary to pneumonia.  #Type 2 diabetes: Well-controlled on home medications.  #Skin cancer: Recent skin graft done to the left nares.  Does not appear infected and should not be disturbed and to follow-up with outpatient dermatology.   RECOMMENDATIONS:    Will change coverage from ceftriaxone to Unasyn given concerns for aspiration.  At this time, do not think he needs broadening to cover for more drug-resistant organisms. Continue doxycycline for atypical coverage as well as MRSA coverage in case of any secondary infection of his scalp. Follow-up strep and Legionella urine antigens Agree with SLP evaluation given concerns for aspiration. Lab monitoring, glycemic control. Discussed with family at the bedside and discussed with primary team. Will follow.     Principal Problem:   CAP (community acquired pneumonia) Active Problems:   Type 2 diabetes mellitus  with stage 3 chronic kidney disease, without long-term current use of insulin (HCC)   Erosive pustular dermatosis of scalp   Tremor of both hands-  both wiht rest and activity; acutely worse   Stage 3a chronic kidney disease (CKD) (HCC)   Squamous cell cancer of skin of nose   Weakness   Lower extremity weakness   Fever   Pressure injury of skin    MEDICATIONS:    Scheduled Meds:  acetaminophen  500 mg Oral Q6H   acitretin  10 mg Oral Q supper   aspirin EC  81 mg Oral QPM   atorvastatin  10 mg Oral QODAY   Chlorhexidine Gluconate Cloth  6 each Topical Daily   clobetasol ointment  1 Application Topical BID   doxycycline  100 mg Oral Q12H   enoxaparin (LOVENOX) injection  30 mg Subcutaneous Q24H   feeding supplement  237 mL Oral BID BM   gabapentin  100 mg Oral TID   insulin aspart  0-6 Units Subcutaneous TID WC   ipratropium-albuterol  3 mL Nebulization BID   multivitamin with minerals  1 tablet Oral Daily   polyethylene glycol  17 g Oral Daily   polyvinyl alcohol  1 drop Both Eyes Daily   primidone  25 mg Oral Daily   tacrolimus  1 Application Topical BID   tamsulosin  0.4 mg Oral Daily   terbinafine   Topical Daily   Continuous Infusions:  sodium chloride 20 mL/hr at 05/16/22 0900   ampicillin-sulbactam (UNASYN) IV 3 g (05/16/22 1033)   PRN Meds:.acetaminophen **OR** acetaminophen, guaiFENesin-dextromethorphan, ipratropium-albuterol, oxyCODONE  SUBJECTIVE:   24 hour events:  Transferred overnight due  to respiratory distress requiring increased O2 CXR repeated showing increased patchy infiltrates likely multifocal pneumonia and small pleural effusion Given lasix x 2 doses Net negative volume since admission Concern noted by rapid response for aspiration.   Febrile overnight up to about 101.7 Respiratory panel negative   Reports that he does not quite have the "get up and go" that he normally does Reports dry cough.  Not so much shortness of breath His daughter  reports that his scalp is not necessarily worse than at other times  Review of Systems  All other systems reviewed and are negative.     OBJECTIVE:   Blood pressure (!) 130/52, pulse 73, temperature 98.6 F (37 C), temperature source Oral, resp. rate 17, height 6' (1.829 m), weight 64.8 kg, SpO2 95 %. Body mass index is 19.38 kg/m.  Physical Exam Constitutional:      General: He is not in acute distress.    Comments: Elderly man, lying in bed, not as tremulous as yesterday.  HENT:     Head:     Comments: Pustular lesions noted on scalp with crusting.  No significant drainage or erythema today.    Nose:     Comments: Dry gauze covering his nostril. Pulmonary:     Effort: Pulmonary effort is normal. No respiratory distress.     Comments: He is currently on 5 L via nasal cannula Abdominal:     General: There is no distension.     Palpations: Abdomen is soft.  Musculoskeletal:        General: Normal range of motion.     Cervical back: Normal range of motion and neck supple.  Skin:    General: Skin is warm.     Findings: No rash.  Neurological:     General: No focal deficit present.     Mental Status: Mental status is at baseline.  Psychiatric:        Mood and Affect: Mood normal.        Behavior: Behavior normal.      Lab Results: Lab Results  Component Value Date   WBC 8.0 05/16/2022   HGB 10.9 (L) 05/16/2022   HCT 31.8 (L) 05/16/2022   MCV 101.3 (H) 05/16/2022   PLT 236 05/16/2022    Lab Results  Component Value Date   NA 137 05/16/2022   K 4.3 05/16/2022   CO2 29 05/16/2022   GLUCOSE 198 (H) 05/16/2022   BUN 30 (H) 05/16/2022   CREATININE 1.57 (H) 05/16/2022   CALCIUM 9.5 05/16/2022   GFRNONAA 42 (L) 05/16/2022   GFRAA 58 (L) 09/06/2020    Lab Results  Component Value Date   ALT 12 05/13/2022   AST 16 05/13/2022   ALKPHOS 35 (L) 05/13/2022   BILITOT 0.6 05/13/2022    No results found for: "CRP"  No results found for: "ESRSEDRATE"   I have  reviewed the micro and lab results in Epic.  Imaging: DG CHEST PORT 1 VIEW  Result Date: 05/16/2022 CLINICAL DATA:  Increasing shortness of breath EXAM: PORTABLE CHEST 1 VIEW COMPARISON:  05/12/2022 FINDINGS: Heart size and pulmonary vascularity are normal. Patchy infiltrates in both mid and lower lung zones, increasing since prior study, likely multifocal pneumonia. Edema less likely. Probable small pleural effusions. No pneumothorax. Degenerative changes in the spine. Postoperative change in the right shoulder. IMPRESSION: Increasing patchy infiltration in both mid and lower lung zones, likely multifocal pneumonia. Edema less likely. Small pleural effusions. Electronically Signed   By: Gwyndolyn Saxon  Gerilyn Nestle M.D.   On: 05/16/2022 01:31     Imaging independently reviewed in Epic.    Raynelle Highland for Infectious Disease [facility] [phone] pager 05/16/2022, 10:47 AM

## 2022-05-16 NOTE — Progress Notes (Signed)
Bedside report received when transferring patient from room 1602

## 2022-05-16 NOTE — Progress Notes (Signed)
Modified Barium Swallow Progress Note  Patient Details  Name: George Barnett MRN: [id number] Date of Birth: 10-23-1933  Today's Date: 05/16/2022  Modified Barium Swallow completed.  Full report located under Chart Review in the Imaging Section.  Brief recommendations include the following:  Clinical Impression  Patient presents with minimal oropharyngeal dysphagia but largely functional for his age.   No aspiration noted during MBS and pt was tested with sequential liquid boluses.  Lingual rocking observed with oral transiting of liquids.  Mild pharyngeal retention without pt awareness and intermittent trace laryngeal penetration observed due to decreased motility - epiglottic deflection, laryngeal closure.   Chin tuck posture tested to determine if helpful and he did not demonstrate penetration or aspiration with this posture.  Upon esophageal sweep, pt appeared with retention without awareness - causing SLP to suspect this may be pt's primary aspiration risk - especially given her reports issues with eructation and fullness "pointing to esophagus" x six months.   Suspect pt's DISH may cause obstructive pulmonary restriction, impairing ability for pt to cough and expectorate.  He advises that he clears liquids better than solids - and thus recommend full liquid floor stock tonight and esophagram 05/17/2022 to rule out correctable source of dysphagia.  RN informed.   Swallow Evaluation Recommendations       SLP Diet Recommendations: Other (Comment) (full liquids from floor stock) recommend consider esophagram next date   Liquid Administration via: Cup;Straw   Medication Administration: Other (Comment) (crush with liquids if not contraindicated)   Supervision: Full assist for feeding;Full supervision/cueing for compensatory strategies   Compensations: Slow rate;Small sips/bites;Other (Comment)   Postural Changes: Remain semi-upright after after feeds/meals (Comment);Seated upright at 90  degrees (small amounts at a time)   Oral Care Recommendations: Oral care QID      Kathleen Lime, MS [facility] SLP Acute Rehab Services Office [phone] Pager [phone]   [name] 05/16/2022,2:35 PM

## 2022-05-16 NOTE — Progress Notes (Addendum)
Daily Progress Note   Patient Name: George Barnett       Date: 05/16/2022 DOB: Oct 16, 1933  Age: 88 y.o. MRN#: [id number] Attending Physician: Elmarie Shiley, MD Primary Care Physician: Haydee Salter, MD Admit Date: 05/12/2022  Reason for Consultation/Follow-up: Establishing goals of care  Subjective:  Appears weak, asks for something to eat/drink, currently NPO, chart reviewed, discussed with daughter at bedside and also his bedside RN.   Length of Stay: 2  Current Medications: Scheduled Meds:   acetaminophen  500 mg Oral Q6H   acitretin  10 mg Oral Q supper   aspirin EC  81 mg Oral QPM   atorvastatin  10 mg Oral QODAY   Chlorhexidine Gluconate Cloth  6 each Topical Daily   clobetasol ointment  1 Application Topical BID   doxycycline  100 mg Oral Q12H   enoxaparin (LOVENOX) injection  30 mg Subcutaneous Q24H   feeding supplement  237 mL Oral BID BM   gabapentin  100 mg Oral TID   insulin aspart  0-6 Units Subcutaneous TID WC   ipratropium-albuterol  3 mL Nebulization BID   multivitamin with minerals  1 tablet Oral Daily   polyethylene glycol  17 g Oral Daily   polyvinyl alcohol  1 drop Both Eyes Daily   primidone  25 mg Oral Daily   tacrolimus  1 Application Topical BID   tamsulosin  0.4 mg Oral Daily   terbinafine   Topical Daily    Continuous Infusions:  sodium chloride 20 mL/hr at 05/16/22 0800   ampicillin-sulbactam (UNASYN) IV     magnesium sulfate bolus IVPB      PRN Meds: acetaminophen **OR** acetaminophen, guaiFENesin-dextromethorphan, ipratropium-albuterol, oxyCODONE  Physical Exam         Appears weak Resting in bed Regular work of breathing Appears with fatigue/generalized weakness Has dressing on his nose Scalp noted  Vital Signs: BP (!) 130/52  (BP Location: Right Arm)   Pulse 72   Temp 98.6 F (37 C) (Oral)   Resp (!) 24   Ht 6' (1.829 m)   Wt 64.8 kg   SpO2 93%   BMI 19.38 kg/m  SpO2: SpO2: 93 % O2 Device: O2 Device: Nasal Cannula O2 Flow Rate: O2 Flow Rate (L/min): 6 L/min  Intake/output summary:  Intake/Output Summary (Last 24 hours) at  05/16/2022 0915 Last data filed at 05/16/2022 0800 Gross per 24 hour  Intake 1666.84 ml  Output 2200 ml  Net -533.16 ml   LBM: Last BM Date : 05/13/22 (per patient and daugther) Baseline Weight: Weight: 65.3 kg Most recent weight: Weight: 64.8 kg       Palliative Assessment/Data:      Patient Active Problem List   Diagnosis Date Noted   Pressure injury of skin 05/16/2022   Fever 05/14/2022   CAP (community acquired pneumonia) 05/13/2022   Weakness 05/13/2022   Lower extremity weakness 05/13/2022   Squamous cell cancer of skin of nose 04/29/2022   Hyperlipidemia 01/17/2022   Basal cell carcinoma 10/29/2021   Squamous cell carcinoma of antihelix, right 11/02/2020   Prostate enlargement    History of myocardial infarction    Elbow effusion, left    Cataract    Stage 3a chronic kidney disease (CKD) ([place]) 09/08/2020   White coat syndrome with diagnosis of hypertension 03/10/2020   Stress and adjustment reaction 01/12/2020   Chronic head/ "scalp" pain 01/12/2020   Anemia due to chemical agent (HCC)-dermatologic medications most likely 12/08/2019   Erosive pustular dermatosis of scalp 12/08/2019   Benign essential tremor 12/08/2019   Vitamin D deficiency 12/08/2019   Recurrent right inguinal hernia 05/18/2019   Atypical chest pain 05/07/2019   Venous insufficiency of both lower extremities 11/27/2018   Tremor of both hands-  both wiht rest and activity; acutely worse 11/27/2018   Orthostatic hypotension 11/05/2018   Bilateral lower extremity edema 11/17/2017   Essential hypertension 08/05/2017   Microalbuminuria due to type 2 diabetes mellitus ([place]) 08/05/2017   RLS  (restless legs syndrome) 06/17/2017   GERD (gastroesophageal reflux disease) 05/05/2017   Glossodynia 05/05/2017   Costochondritis 05/05/2017   Type 2 diabetes mellitus with stage 3 chronic kidney disease, without long-term current use of insulin ([place]) 05/05/2017   CAD (coronary artery disease) 03/25/2017   Snoring 03/25/2017   Urinary retention due to benign prostatic hyperplasia 01/25/2015   Primary osteoarthritis of right shoulder 01/24/2015    Palliative Care Assessment & Plan   Patient Profile:    Assessment: 88 year old gentleman admitted with suspected pneumonia, pustular dermatosis of scalp, has underlying stage III chronic kidney disease, history of diabetes and skin cancer.  Palliative medicine consulted for additional supportive care as well as continuation of goals of care discussions  Discussion/recommendations/Plan: Chart reviewed, overnight events noted.  Patient noted to have had a rapid response and transferred down to the ICU, chest x-ray showed multifocal pneumonia, infectious disease consulted and antibiotics have been changed.  Requiring supplemental oxygen currently 93% on 6 L nasal cannula.  N.p.o. awaiting SLP evaluation ongoing concern for silent aspiration going on.  Checked in with patient as well as daughter who was present at bedside, offered support.  Monitor overall disease trajectory of illness and current hospital course.  Continue current mode of care.  Goals of Care and Additional Recommendations: Limitations on Scope of Treatment: DNR otherwise full scope treatment.   Code Status:    Code Status Orders  (From admission, onward)           Start     Ordered   05/15/22 1153  Do not attempt resuscitation (DNR)  Continuous       Question Answer Comment  In the event of cardiac or respiratory ARREST Do not call a "code blue"   In the event of cardiac or respiratory ARREST Do not perform Intubation, CPR, defibrillation or ACLS  In the event of  cardiac or respiratory ARREST Use medication by any route, position, wound care, and other measures to relive pain and suffering. May use oxygen, suction and manual treatment of airway obstruction as needed for comfort.      05/15/22 1152           Code Status History     Date Active Date Inactive Code Status Order ID Comments User Context   05/13/2022 0619 05/15/2022 1152 Full Code [id number]  Rise Patience, MD Inpatient   05/18/2019 1040 05/19/2019 1213 Full Code [id number]  Erroll Luna, MD Inpatient   01/24/2015 1322 01/25/2015 1653 Full Code [id number]  Marchia Bond, MD Inpatient      Advance Directive Documentation    Flowsheet Row Most Recent Value  Type of Advance Directive Living will, Healthcare Power of Attorney  Pre-existing out of facility DNR order (yellow form or pink MOST form) --  "MOST" Form in Place? --       Prognosis:  guarded  Discharge Planning: To Be Determined  Care plan was discussed with patient and daughter who was at bedside  Thank you for allowing the Palliative Medicine Team to assist in the care of this patient.   Time In: 8.30 Time Out: 9 Total Time 30 Prolonged Time Billed  no       Greater than 50%  of this time was spent counseling and coordinating care related to the above assessment and plan.  Loistine Chance, MD  Please contact Palliative Medicine Team phone at [phone] for questions and concerns.

## 2022-05-16 NOTE — Progress Notes (Signed)
Notified MD Regalado about patient's barium swallow exam by speech therapy. Recommendation was that patient will need an esophogram. MD Regaldo ordered to hold oral medications for the rest of the shift. MD Regaldo ordered that patient can have a few sips of clear liquids.

## 2022-05-16 NOTE — Progress Notes (Addendum)
PROGRESS NOTE    George Barnett  YBO:[id number] DOB: Jul 03, 1933 DOA: 05/12/2022 PCP: Haydee Salter, MD   Brief Narrative: 88 year old with past medical history significant for diabetes, hypertension, CAD, chronic and recalcitrant erosive posterior dermatosis of the scalp on extensive treatment presented with increased weakness of both lower extremity and episode of fevers.  Neuro exam is nonfocal.  Chest x-ray showed left lower lobe atelectasis.  COVID and influenza negative.  Admitted with persistent fevers and symptoms.  Patient continued to spike fever despite being on Tylenol.  ID consulted for further evaluation of scalp dermatosis and concern for superimposed infection.  Patient develops Worsening respiratory failure overnight 6/29. He required up to 15 L oxygen. He received 2 doses of IV lasix which improved urine out put. Chest x ray is concerning with worsening multifocal PNA> Antibiotics change to Unasyn. Speech therapy evaluation. Oxygen requirement this morning has decreased to 6 L.    Assessment & Plan:   Principal Problem:   CAP (community acquired pneumonia) Active Problems:   Type 2 diabetes mellitus with stage 3 chronic kidney disease, without long-term current use of insulin (HCC)   Erosive pustular dermatosis of scalp   Tremor of both hands-  both wiht rest and activity; acutely worse   Stage 3a chronic kidney disease (CKD) (HCC)   Squamous cell cancer of skin of nose   Weakness   Lower extremity weakness   Fever   Pressure injury of skin  1-Bilateral lower extremity weakness: Suspect related to debility and generalized weakness. He was able to ambulate in the hall.  No significant focal weakness. MRI thoracic and lumbar spine reviewed with neurosurgeon on-call, no significant nerve  encroachment, compression.  Continue with PT>   2-Acute Hypoxic Respiratory failure.  Suspect related to PNA, component of pulmonary edema after he received IV fluids yesterday.   Oxygen requirement down to 6 L today.  Hold on further dose of lasix today. Monitor clinically.  Plan to check ECHO.  Antibiotics changes to Unasyn.  Schedule Nebulizer BID>   3-Multifocal PNA;  Also concern for superimposed cellulitis of the scalp ID consulted and following.  Azithromycin changed to doxycycline to cover for cellulitis of the scalp Blood cultures negative x 3 days Lactic acid at 2. . Received IV bolus 6/28.Marland Kitchen  Ceftriaxone change to Unasyn.  Speech consult for swallow evaluation.   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 He follows at [facility] with dermatologist. Attending physician on 6/27 spoke with dermatologist from [facility]. Recommendation was to continue acitretin, as this is less likely to contribute to current presentation. Plan to discontinue dapsone as it may cause weakness. She recommended continued topical treatment with tacrolimus and clobetasol. Patient does not wishes at this point to continue with topical treatment as it is very painful.  I offered oxycodone as needed for pain. ID consulted for evaluation of ? Superimpose cellulitis. Azitro change to Doxycycline.  Wound care consulted appreciate recommendations.   4-Chronic iron deficiency anemia: Stable  Diabetes type 2: Continue with a sliding scale.  Hold metformin and Jardiance.  Prostatism;  He had some urinary retention. Started on Flomax.  Continue with bladder scan.   AKI on CKD stage IIIa Hold Cozaar. Prior cr per record at 1.4.  Strict I and . Bladder scan.  Renal function stable at 1.5  Hyponatremia; received IV fluids.    Essential tremors, continue with primidone   Skin cancer: Has a  skin graft procedure done, do not remove dressing. Wound care consulted   Nutrition Problem: Increased nutrient needs Etiology: acute illness    Signs/Symptoms: estimated needs    Interventions: Ensure  Enlive (each supplement provides 350kcal and 20 grams of protein), MVI, Liberalize Diet  Estimated body mass index is 19.38 kg/m as calculated from the following:   Height as of this encounter: 6' (1.829 m).   Weight as of this encounter: 64.8 kg.   DVT prophylaxis:  Code Status: DNR, patient confirm code status of DNR Family Communication: Daughter at bedside.  Disposition Plan:  Status is: Inpatient Remains inpatient appropriate because: fever    Consultants:  ID  Procedures:  None  Antimicrobials:    Subjective: He is fair. He report breathing ok this am. Better than last night. He had coughing spell last night, daughter notice he was not doing well, having chills, cough. Oxygen was check and was at 69.  He report nasal drainage, plan to start flonase.   Objective: Vitals:   05/16/22 0700 05/16/22 0735 05/16/22 0756 05/16/22 0800  BP: (!) 130/52     Pulse: 70 72  72  Resp: (!) 21 17  (!) 24  Temp:   98.6 F (37 C)   TempSrc:   Oral   SpO2: 97% 98%  93%  Weight:      Height:        Intake/Output Summary (Last 24 hours) at 05/16/2022 0827 Last data filed at 05/16/2022 0800 Gross per 24 hour  Intake 1666.84 ml  Output 2200 ml  Net -533.16 ml    Filed Weights   05/12/22 2203 05/13/22 0510 05/16/22 0213  Weight: 65.3 kg 63.2 kg 64.8 kg    Examination:  General exam: NAD Respiratory system: Sporadic wheezing on the right, mild crackles  Cardiovascular system: S 1, S 2 RRR Gastrointestinal system: BS present, soft, nt Central nervous system:alert, answer questions.  Extremities: no edema  Data Reviewed: I have personally reviewed following labs and imaging studies  CBC: Recent Labs  Lab 05/12/22 2326 05/13/22 0552 05/13/22 0619 05/15/22 1119 05/16/22 6433  WBC 7.4 DUPLICATE REQUEST 7.2 7.3 8.0  NEUTROABS 5.4 DUPLICATE REQUEST 4.7  --   --   HGB 29.5* DUPLICATE REQUEST 18.8* 10.4* 41.6*  HCT 60.6* DUPLICATE REQUEST 30.1* 30.9* 31.8*  MCV 601.0*  DUPLICATE REQUEST 932.3* 103.0* 557.3*  PLT 220 DUPLICATE REQUEST 254 270 623    Basic Metabolic Panel: Recent Labs  Lab 05/12/22 2326 05/13/22 0552 05/15/22 1119 05/16/22 0326  NA 138 138 133* 137  K 4.4 4.2 4.3 4.3  CL 102 103 97* 99  CO2 '27 27 27 29  '$ GLUCOSE 159* 120* 270* 198*  BUN 41* 37* 34* 30*  CREATININE 1.64* 1.62* 1.61* 1.57*  CALCIUM 9.5 8.9 9.3 9.5  MG  --   --   --  1.7    GFR: Estimated Creatinine Clearance: 30.4 mL/min (A) (by C-G formula based on SCr of 1.57 mg/dL (H)). Liver Function Tests: Recent Labs  Lab 05/12/22 2326 05/13/22 0552  AST 16 16  ALT 14 12  ALKPHOS 39 35*  BILITOT 0.7 0.6  PROT 6.0* 5.5*  ALBUMIN 3.1* 2.8*    No results for input(s): "LIPASE", "AMYLASE" in the last 168 hours. No results for input(s): "AMMONIA" in the last 168 hours. Coagulation Profile: Recent Labs  Lab 05/12/22 2326  INR 1.1    Cardiac Enzymes: Recent Labs  Lab 05/13/22 0552  CKTOTAL 32*    BNP (last  3 results) No results for input(s): "PROBNP" in the last 8760 hours. HbA1C: No results for input(s): "HGBA1C" in the last 72 hours. CBG: Recent Labs  Lab 05/15/22 0749 05/15/22 1206 05/15/22 1653 05/15/22 2103 05/16/22 0723  GLUCAP 146* 218* 189* 262* 192*    Lipid Profile: No results for input(s): "CHOL", "HDL", "LDLCALC", "TRIG", "CHOLHDL", "LDLDIRECT" in the last 72 hours. Thyroid Function Tests: No results for input(s): "TSH", "T4TOTAL", "FREET4", "T3FREE", "THYROIDAB" in the last 72 hours.  Anemia Panel: No results for input(s): "VITAMINB12", "FOLATE", "FERRITIN", "TIBC", "IRON", "RETICCTPCT" in the last 72 hours.  Sepsis Labs: Recent Labs  Lab 05/13/22 0100 05/13/22 0325 05/15/22 1119 05/15/22 1204 05/15/22 1532  PROCALCITON  --   --  0.14  --   --   LATICACIDVEN 1.1 1.0  --  2.0* 1.4     Recent Results (from the past 240 hour(s))  Urine Culture     Status: None   Collection Time: 05/12/22 11:26 PM   Specimen: In/Out Cath  Urine  Result Value Ref Range Status   Specimen Description   Final    IN/OUT CATH URINE Performed at [facility], [place] [address], [place], [place] [address]    Special Requests   Final    NONE Performed at [facility], [place] [address] [place], [place] [address]    Culture   Final    NO GROWTH Performed at [facility], [place] [address] [place],  [address]    Report Status 05/14/2022 FINAL  Final  Resp Panel by RT-PCR (Flu A&B, Covid) Anterior Nasal Swab     Status: None   Collection Time: 05/12/22 11:26 PM   Specimen: Anterior Nasal Swab  Result Value Ref Range Status   SARS Coronavirus 2 by RT PCR NEGATIVE NEGATIVE Final    Comment: (NOTE) SARS-CoV-2 target nucleic acids are NOT DETECTED.  The SARS-CoV-2 RNA is generally detectable in upper respiratory specimens during the acute phase of infection. The lowest concentration of SARS-CoV-2 viral copies this assay can detect is 138 copies/mL. A negative result does not preclude SARS-Cov-2 infection and should not be used as the sole basis for treatment or other patient management decisions. A negative result may occur with  improper specimen collection/handling, submission of specimen other than nasopharyngeal swab, presence of viral mutation(s) within the areas targeted by this assay, and inadequate number of viral copies(<138 copies/mL). A negative result must be combined with clinical observations, patient history, and epidemiological information. The expected result is Negative.  Fact Sheet for Patients:  EntrepreneurPulse.com.au  Fact Sheet for Healthcare Providers:  IncredibleEmployment.be  This test is no t yet approved or cleared by the [place] FDA and  has been authorized for detection and/or diagnosis of SARS-CoV-2 by FDA under an Emergency Use Authorization (EUA). This EUA will remain  in effect (meaning this test can be  used) for the duration of the COVID-19 declaration under Section 564(b)(1) of the Act, 21 U.S.C.section 360bbb-3(b)(1), unless the authorization is terminated  or revoked sooner.       Influenza A by PCR NEGATIVE NEGATIVE Final   Influenza B by PCR NEGATIVE NEGATIVE Final    Comment: (NOTE) The Xpert Xpress SARS-CoV-2/FLU/RSV plus assay is intended as an aid in the diagnosis of influenza from Nasopharyngeal swab specimens and should not be used as a sole basis for treatment. Nasal washings and aspirates are unacceptable for Xpert Xpress SARS-CoV-2/FLU/RSV testing.  Fact Sheet for Patients: EntrepreneurPulse.com.au  Fact Sheet  for Healthcare Providers: IncredibleEmployment.be  This test is not yet approved or cleared by the [place] and has been authorized for detection and/or diagnosis of SARS-CoV-2 by FDA under an Emergency Use Authorization (EUA). This EUA will remain in effect (meaning this test can be used) for the duration of the COVID-19 declaration under Section 564(b)(1) of the Act, 21 U.S.C. section 360bbb-3(b)(1), unless the authorization is terminated or revoked.  Performed at [facility], [place] [address] [place], [place] [address]   Blood culture (routine x 2)     Status: None (Preliminary result)   Collection Time: 05/13/22  1:00 AM   Specimen: BLOOD  Result Value Ref Range Status   Specimen Description   Final    BLOOD RIGHT ANTECUBITAL Performed at [place] [address], [place], [place] [address]    Special Requests   Final    BOTTLES DRAWN AEROBIC AND ANAEROBIC Blood Culture results may not be optimal due to an excessive volume of blood received in culture bottles Performed at [place] [address], [place], [place] [address]    Culture   Final    NO GROWTH 3 DAYS Performed at [facility], [place] [address], [place], [place]  [address]    Report Status PENDING  Incomplete  Blood culture (routine x 2)     Status: None (Preliminary result)   Collection Time: 05/13/22  5:52 AM   Specimen: BLOOD  Result Value Ref Range Status   Specimen Description   Final    BLOOD RIGHT ANTECUBITAL Performed at [place] [address], [place], [place] [address]    Special Requests   Final    BOTTLES DRAWN AEROBIC AND ANAEROBIC Blood Culture adequate volume Performed at [place] [address], [place], [place] [address]    Culture   Final    NO GROWTH 3 DAYS Performed at [facility], [place] [address] [place], [place] [address]    Report Status PENDING  Incomplete  Respiratory (~20 pathogens) panel by PCR     Status: None   Collection Time: 05/15/22  2:01 PM   Specimen: Nasopharyngeal Swab; Respiratory  Result Value Ref Range Status   Adenovirus NOT DETECTED NOT DETECTED Final   Coronavirus 229E NOT DETECTED NOT DETECTED Final    Comment: (NOTE) The Coronavirus on the Respiratory Panel, DOES NOT test for the novel  Coronavirus (2019 nCoV)    Coronavirus HKU1 NOT DETECTED NOT DETECTED Final   Coronavirus NL63 NOT DETECTED NOT DETECTED Final   Coronavirus OC43 NOT DETECTED NOT DETECTED Final   Metapneumovirus NOT DETECTED NOT DETECTED Final   Rhinovirus / Enterovirus NOT DETECTED NOT DETECTED Final   Influenza A NOT DETECTED NOT DETECTED Final   Influenza B NOT DETECTED NOT DETECTED Final   Parainfluenza Virus 1 NOT DETECTED NOT DETECTED Final   Parainfluenza Virus 2 NOT DETECTED NOT DETECTED Final   Parainfluenza Virus 3 NOT DETECTED NOT DETECTED Final   Parainfluenza Virus 4 NOT DETECTED NOT DETECTED Final   Respiratory Syncytial Virus NOT DETECTED NOT DETECTED Final   Bordetella pertussis NOT DETECTED NOT DETECTED Final   Bordetella Parapertussis NOT DETECTED NOT DETECTED Final   Chlamydophila pneumoniae NOT DETECTED NOT DETECTED Final   Mycoplasma pneumoniae NOT  DETECTED NOT DETECTED Final    Comment: Performed at [facility], [place]. [address] [place], [place] [address]  Radiology Studies: DG CHEST PORT 1 VIEW  Result Date: 05/16/2022 CLINICAL DATA:  Increasing shortness of breath EXAM: PORTABLE CHEST 1 VIEW COMPARISON:  05/12/2022 FINDINGS: Heart size and pulmonary vascularity are normal. Patchy infiltrates in both mid and lower lung zones, increasing since prior study, likely multifocal pneumonia. Edema less likely. Probable small pleural effusions. No pneumothorax. Degenerative changes in the spine. Postoperative change in the right shoulder. IMPRESSION: Increasing patchy infiltration in both mid and lower lung zones, likely multifocal pneumonia. Edema less likely. Small pleural effusions. Electronically Signed   By: Lucienne Capers M.D.   On: 05/16/2022 01:31        Scheduled Meds:  acetaminophen  500 mg Oral Q6H   acitretin  10 mg Oral Q supper   aspirin EC  81 mg Oral QPM   atorvastatin  10 mg Oral QODAY   Chlorhexidine Gluconate Cloth  6 each Topical Daily   clobetasol ointment  1 Application Topical BID   doxycycline  100 mg Oral Q12H   enoxaparin (LOVENOX) injection  30 mg Subcutaneous Q24H   feeding supplement  237 mL Oral BID BM   gabapentin  100 mg Oral TID   insulin aspart  0-6 Units Subcutaneous TID WC   ipratropium-albuterol  3 mL Nebulization BID   multivitamin with minerals  1 tablet Oral Daily   polyethylene glycol  17 g Oral Daily   polyvinyl alcohol  1 drop Both Eyes Daily   primidone  25 mg Oral Daily   tacrolimus  1 Application Topical BID   tamsulosin  0.4 mg Oral Daily   terbinafine   Topical Daily   Continuous Infusions:  sodium chloride 20 mL/hr at 05/16/22 0800   ampicillin-sulbactam (UNASYN) IV     magnesium sulfate bolus IVPB       LOS: 2 days    Time spent: 45 minutes    Jeri Jeanbaptiste A Afreen Siebels, MD [facility]   If 7PM-7AM, please contact  night-coverage [email or web address]  05/16/2022, 8:27 AM

## 2022-05-16 NOTE — Progress Notes (Signed)
Physical Therapy Treatment Patient Details Name: George Barnett MRN: [id number] DOB: October 01, 1933 Today's Date: 05/16/2022   History of Present Illness 88 y.o. male with complex medical history including stage IV CKD, diabetes mellitus, erosive pustular dermatosis of the scalp, hypertension, restless leg syndrome, and CAD who presents to the emergency department for evaluation of progressively worsening weakness.George Barnett to SDU due to respiratory distress 05/15/22    PT Comments    Patient moved to SDU for respiratory distress. Patient on 5 L HFNC. SPO2  95% at rest. Patient assisted to   sit and stand/pivot to recliner with +2 assistance.  SPO2 855 during transfer. HR in 10'  Per chart/TOC ,patient will be able to use "free" days In SNF at [facility].   Continue PT for  mobility.  Recommendations for follow up therapy are one component of a multi-disciplinary discharge planning process, led by the attending physician.  Recommendations may be updated based on patient status, additional functional criteria and insurance authorization.  Follow Up Recommendations  Skilled nursing-short term rehab (<3 hours/day) Can patient physically be transported by private vehicle: No   Assistance Recommended at Discharge Frequent or constant Supervision/Assistance  Patient can return home with the following A little help with walking and/or transfers;A little help with bathing/dressing/bathroom;Assist for transportation   Equipment Recommendations  None recommended by PT    Recommendations for Other Services       Precautions / Restrictions Precautions Precautions: Fall Precaution Comments: monitor  sats Restrictions Weight Bearing Restrictions: No     Mobility  Bed Mobility Overal bed mobility: Needs Assistance Bed Mobility: Supine to Sit     Supine to sit: Min assist, HOB elevated     General bed mobility comments: extra time, assist to scoot  forward    Transfers Overall transfer  level: Needs assistance Equipment used: Rolling walker (2 wheels) Transfers: Sit to/from Stand Sit to Stand: Mod assist, +2 safety/equipment, +2 physical assistance           General transfer comment: assist to power up from bed, steady  assist in standing. Decreased control to sit down, more of a flop.    Ambulation/Gait Ambulation/Gait assistance: Mod assist, +2 physical assistance, +2 safety/equipment Gait Distance (Feet): 4 Feet Assistive device: Rolling walker (2 wheels) Gait Pattern/deviations: Step-to pattern       General Gait Details: extra time, shuffling steps, stopped partway, cues to keep moving towards recliner.   Stairs             Wheelchair Mobility    Modified Rankin (Stroke Patients Only)       Balance Overall balance assessment: Needs assistance, History of Falls Sitting-balance support: Feet supported, Bilateral upper extremity supported Sitting balance-Leahy Scale: Fair     Standing balance support: Bilateral upper extremity supported, Reliant on assistive device for balance Standing balance-Leahy Scale: Poor Standing balance comment: posterior leaning the longer standing,                            Cognition Arousal/Alertness: Awake/alert Behavior During Therapy: WFL for tasks assessed/performed                                            Exercises      General Comments        Pertinent Vitals/Pain Pain Assessment Faces Pain Scale: Hurts even more  Pain Location: scalp Pain Descriptors / Indicators: Discomfort Pain Intervention(s): Monitored during session    Home Living                          Prior Function            PT Goals (current goals can now be found in the care plan section) Progress towards PT goals: Progressing toward goals    Frequency    Min 3X/week      PT Plan Discharge plan needs to be updated    Co-evaluation              AM-PAC PT "6  Clicks" Mobility   Outcome Measure  Help needed turning from your back to your side while in a flat bed without using bedrails?: A Lot Help needed moving from lying on your back to sitting on the side of a flat bed without using bedrails?: A Lot Help needed moving to and from a bed to a chair (including a wheelchair)?: A Lot Help needed standing up from a chair using your arms (e.g., wheelchair or bedside chair)?: A Lot Help needed to walk in hospital room?: Total Help needed climbing 3-5 steps with a railing? : Total 6 Click Score: 10    End of Session Equipment Utilized During Treatment: Gait belt;Oxygen Activity Tolerance: Patient limited by fatigue Patient left: in chair;with call bell/phone within reach;with family/visitor present;with nursing/sitter in room Nurse Communication: Mobility status PT Visit Diagnosis: Other abnormalities of gait and mobility (R26.89)     Time: 9381-0175 PT Time Calculation (min) (ACUTE ONLY): 16 min  Charges:  $Therapeutic Activity: 8-22 mins                     Tresa Endo PT Acute Rehabilitation Services Office [phone] Weekend [phone]    George Barnett 05/16/2022, 9:53 AM

## 2022-05-16 NOTE — Progress Notes (Signed)
Patient refusing some aspects of wound care for his scalp. Patient only wanting vaseline gauze applied. Will continue to monitor and assess.

## 2022-05-16 NOTE — Progress Notes (Addendum)
    OVERNIGHT PROGRESS REPORT  Notified by RR RN for response to patient in respiratory distress. At bedside, patient was on NRBM with decreased sats and slight edema to the ankles. Obvious rales/rhonchi to lower lung fields. Lasix given with approximately 250 mL of urine returned.  Patient taken off of nonrebreather mask and on salter type high flow at 12 L/min. SPO2 remains 90. Second dose of Lasix given. At approximately 30 minutes more urine is returned. Patient still remains febrile but is in no obvious or stated distress.  He will be moved to stepdown as a progressive floor overflow.  Nebulizer treatments ordered. Chest x-ray ordered.-Available Lab draw was ordered.-Pending Swallow study ordered due to concern of possible "silent aspiration" occurring. Patient is on antibiotics.   Family member is an Therapist, sports, good historian, and is cognizant of his condition(s). Patient does not want intubation and is DNR however BiPAP is acceptable if needed, some reluctance due to skin condition/location of.   ===================================================== Update       Imaging: In part:   "IMPRESSION: Increasing patchy infiltration in both mid and lower lung zones, likely multifocal pneumonia. Edema less likely. Small pleural effusions.   Electronically Signed   By: Lucienne Capers M.D.   On: 05/16/2022 01:31" =====================================================  ABG:   Latest Reference Range & Units 05/16/22 01:13  pH, Arterial 7.35 - 7.45  7.47 (H)  pCO2 arterial 32 - 48 mmHg 39  pO2, Arterial 83 - 108 mmHg 70 (L)  Acid-Base Excess 0.0 - 2.0 mmol/L 4.8 (H)  Bicarbonate 20.0 - 28.0 mmol/L 28.1 (H)  O2 Saturation % 90.7  Patient temperature  38.7  Allens test (pass/fail) PASS  PASS  (H): Data is abnormally high (L): Data is abnormally low =====================================================  Chemistry    Latest Reference Range & Units 05/16/22 88:91  BASIC  METABOLIC PANEL  Rpt !  Sodium 135 - 145 mmol/L 137  Potassium 3.5 - 5.1 mmol/L 4.3  Chloride 98 - 111 mmol/L 99  CO2 22 - 32 mmol/L 29  Glucose 70 - 99 mg/dL 198 (H)  BUN 8 - 23 mg/dL 30 (H)  Creatinine 0.61 - 1.24 mg/dL 1.57 (H)  Calcium 8.9 - 10.3 mg/dL 9.5  Anion gap 5 - 15  9  Magnesium 1.7 - 2.4 mg/dL 1.7  !: Data is abnormal (H): Data is abnormally high Rpt: View report in Results Review for more information      Gershon Cull MSNA MSN ACNPC-AG Acute Care Nurse Practitioner [place]

## 2022-05-17 ENCOUNTER — Inpatient Hospital Stay (HOSPITAL_COMMUNITY): Payer: Medicare Other

## 2022-05-17 DIAGNOSIS — J189 Pneumonia, unspecified organism: Secondary | ICD-10-CM | POA: Diagnosis not present

## 2022-05-17 DIAGNOSIS — R531 Weakness: Secondary | ICD-10-CM

## 2022-05-17 DIAGNOSIS — Z515 Encounter for palliative care: Secondary | ICD-10-CM | POA: Diagnosis not present

## 2022-05-17 DIAGNOSIS — Z7189 Other specified counseling: Secondary | ICD-10-CM

## 2022-05-17 DIAGNOSIS — L988 Other specified disorders of the skin and subcutaneous tissue: Secondary | ICD-10-CM | POA: Diagnosis not present

## 2022-05-17 DIAGNOSIS — J9601 Acute respiratory failure with hypoxia: Secondary | ICD-10-CM

## 2022-05-17 DIAGNOSIS — R29898 Other symptoms and signs involving the musculoskeletal system: Secondary | ICD-10-CM | POA: Diagnosis not present

## 2022-05-17 LAB — GLUCOSE, CAPILLARY
Glucose-Capillary: 165 mg/dL — ABNORMAL HIGH (ref 70–99)
Glucose-Capillary: 227 mg/dL — ABNORMAL HIGH (ref 70–99)

## 2022-05-17 LAB — BASIC METABOLIC PANEL
Anion gap: 11 (ref 5–15)
BUN: 31 mg/dL — ABNORMAL HIGH (ref 8–23)
CO2: 27 mmol/L (ref 22–32)
Calcium: 9 mg/dL (ref 8.9–10.3)
Chloride: 101 mmol/L (ref 98–111)
Creatinine, Ser: 1.54 mg/dL — ABNORMAL HIGH (ref 0.61–1.24)
GFR, Estimated: 43 mL/min — ABNORMAL LOW (ref >60)
Glucose, Bld: 155 mg/dL — ABNORMAL HIGH (ref 70–99)
Potassium: 4.4 mmol/L (ref 3.5–5.1)
Sodium: 139 mmol/L (ref 135–145)

## 2022-05-17 LAB — CBC
HCT: 32.7 % — ABNORMAL LOW (ref 39.0–52.0)
Hemoglobin: 11.1 g/dL — ABNORMAL LOW (ref 13.0–17.0)
MCH: 34.7 pg — ABNORMAL HIGH (ref 26.0–34.0)
MCHC: 33.9 g/dL (ref 30.0–36.0)
MCV: 102.2 fL — ABNORMAL HIGH (ref 80.0–100.0)
Platelets: 269 10*3/uL (ref 150–400)
RBC: 3.2 MIL/uL — ABNORMAL LOW (ref 4.22–5.81)
RDW: 13.3 % (ref 11.5–15.5)
WBC: 7.9 10*3/uL (ref 4.0–10.5)
nRBC: 0 % (ref 0.0–0.2)

## 2022-05-17 LAB — LEGIONELLA PNEUMOPHILA SEROGP 1 UR AG: L. pneumophila Serogp 1 Ur Ag: NEGATIVE

## 2022-05-17 MED ORDER — DIPHENHYDRAMINE-ZINC ACETATE 2-0.1 % EX CREA
TOPICAL_CREAM | Freq: Two times a day (BID) | CUTANEOUS | Status: DC | PRN
  Filled 2022-05-17: qty 28

## 2022-05-17 MED ORDER — MORPHINE SULFATE (PF) 2 MG/ML IV SOLN
1.0000 mg | INTRAVENOUS | Status: DC | PRN
  Administered 2022-05-20 (×3): 1 mg via INTRAVENOUS
  Filled 2022-05-17 (×3): qty 1

## 2022-05-17 MED ORDER — OXYCODONE HCL 5 MG PO TABS
5.0000 mg | ORAL_TABLET | ORAL | Status: DC | PRN
  Administered 2022-05-17 – 2022-05-19 (×5): 5 mg via ORAL
  Filled 2022-05-17 (×5): qty 1

## 2022-05-17 MED ORDER — LIP MEDEX EX OINT: TOPICAL_OINTMENT | CUTANEOUS | Status: DC | PRN

## 2022-05-17 MED ORDER — POLYVINYL ALCOHOL 1.4 % OP SOLN
1.0000 [drp] | OPHTHALMIC | Status: DC | PRN
  Filled 2022-05-17: qty 15

## 2022-05-17 NOTE — Progress Notes (Signed)
Manufacturing engineer [facility]) Hospital Liaison Note  Referral received for patient/family interest in hospice at [facility].   [place] liaison will follow up with family tomorrow.   Please call with any questions or concerns. Thank you  Roselee Nova, [place] Hospital Liaison [phone]

## 2022-05-17 NOTE — Progress Notes (Signed)
Daily Progress Note   Patient Name: George Barnett       Date: 05/17/2022 DOB: 1933/10/14  Age: 88 y.o. MRN#: [id number] Attending Physician: Elmarie Shiley, MD Primary Care Physician: Haydee Salter, MD Admit Date: 05/12/2022  Reason for Consultation/Follow-up: Establishing goals of care  Subjective:  Resting in bed, awakens easily, goals of care discussions undertaken with patient, as well as with his family in the room, see below.    Length of Stay: 3  Current Medications: Scheduled Meds:   acetaminophen  500 mg Oral Q6H   acitretin  10 mg Oral Q supper   clobetasol ointment  1 Application Topical BID   feeding supplement  237 mL Oral BID BM   fluticasone  1 spray Each Nare Daily   gabapentin  100 mg Oral TID   insulin aspart  0-6 Units Subcutaneous TID WC   ipratropium-albuterol  3 mL Nebulization BID   mouth rinse  15 mL Mouth Rinse 4 times per day   pantoprazole (PROTONIX) IV  40 mg Intravenous Q12H   polyethylene glycol  17 g Oral Daily   polyvinyl alcohol  1 drop Both Eyes Daily   primidone  25 mg Oral Daily   tacrolimus  1 Application Topical BID   tamsulosin  0.4 mg Oral Daily   terbinafine   Topical Daily    Continuous Infusions:  ampicillin-sulbactam (UNASYN) IV Stopped (05/17/22 1102)    PRN Meds: acetaminophen **OR** acetaminophen, diphenhydrAMINE-zinc acetate, guaiFENesin-dextromethorphan, ipratropium-albuterol, lip balm, morphine injection, mouth rinse, oxyCODONE, polyvinyl alcohol  Physical Exam         Appears weak Resting in bed Regular work of breathing Appears with fatigue/generalized weakness Has dressing on his nose Scalp noted  Vital Signs: BP (!) 116/56 (BP Location: Left Arm)   Pulse 70   Temp 98.5 F (36.9 C) (Oral)   Resp 19   Ht  6' (1.829 m)   Wt 64.8 kg   SpO2 97%   BMI 19.38 kg/m  SpO2: SpO2: 97 % O2 Device: O2 Device: Nasal Cannula O2 Flow Rate: O2 Flow Rate (L/min): 5 L/min  Intake/output summary:  Intake/Output Summary (Last 24 hours) at 05/17/2022 1326 Last data filed at 05/17/2022 1200 Gross per 24 hour  Intake 203.25 ml  Output 650 ml  Net -446.75 ml  LBM: Last BM Date : 05/13/22 (per patient and daugther) Baseline Weight: Weight: 65.3 kg Most recent weight: Weight: 64.8 kg       Palliative Assessment/Data:      Patient Active Problem List   Diagnosis Date Noted   Pressure injury of skin 05/16/2022   Fever 05/14/2022   CAP (community acquired pneumonia) 05/13/2022   Weakness 05/13/2022   Lower extremity weakness 05/13/2022   Squamous cell cancer of skin of nose 04/29/2022   Hyperlipidemia 01/17/2022   Basal cell carcinoma 10/29/2021   Squamous cell carcinoma of antihelix, right 11/02/2020   Prostate enlargement    History of myocardial infarction    Elbow effusion, left    Cataract    Stage 3a chronic kidney disease (CKD) ([place]) 09/08/2020   White coat syndrome with diagnosis of hypertension 03/10/2020   Stress and adjustment reaction 01/12/2020   Chronic head/ "scalp" pain 01/12/2020   Anemia due to chemical agent (HCC)-dermatologic medications most likely 12/08/2019   Erosive pustular dermatosis of scalp 12/08/2019   Benign essential tremor 12/08/2019   Vitamin D deficiency 12/08/2019   Recurrent right inguinal hernia 05/18/2019   Atypical chest pain 05/07/2019   Venous insufficiency of both lower extremities 11/27/2018   Tremor of both hands-  both wiht rest and activity; acutely worse 11/27/2018   Orthostatic hypotension 11/05/2018   Bilateral lower extremity edema 11/17/2017   Essential hypertension 08/05/2017   Microalbuminuria due to type 2 diabetes mellitus ([place]) 08/05/2017   RLS (restless legs syndrome) 06/17/2017   GERD (gastroesophageal reflux disease) 05/05/2017    Glossodynia 05/05/2017   Costochondritis 05/05/2017   Type 2 diabetes mellitus with stage 3 chronic kidney disease, without long-term current use of insulin ([place]) 05/05/2017   CAD (coronary artery disease) 03/25/2017   Snoring 03/25/2017   Urinary retention due to benign prostatic hyperplasia 01/25/2015   Primary osteoarthritis of right shoulder 01/24/2015    Palliative Care Assessment & Plan   Patient Profile:    Assessment: 88 year old gentleman admitted with suspected pneumonia, pustular dermatosis of scalp, has underlying stage III chronic kidney disease, history of diabetes and skin cancer.  Palliative medicine consulted for additional supportive care as well as continuation of goals of care discussions  Discussion/recommendations/Plan: Goals of care discussions were held in a family meeting today. Discussed with wife and 2 daughters, also discussed with patient. At this time, patient and family wish for comfort care, singular focus on his symptoms and hospice support at his facility. Will discontinue medications not directly contributing to his comfort. Comfort feeds ok. TOC consulted. Appreciate RN colleague Darrick Meigs.     Goals of Care and Additional Recommendations: Comfort measures as of 05-17-22.   Code Status:    Code Status Orders  (From admission, onward)           Start     Ordered   05/15/22 1153  Do not attempt resuscitation (DNR)  Continuous       Question Answer Comment  In the event of cardiac or respiratory ARREST Do not call a "code blue"   In the event of cardiac or respiratory ARREST Do not perform Intubation, CPR, defibrillation or ACLS   In the event of cardiac or respiratory ARREST Use medication by any route, position, wound care, and other measures to relive pain and suffering. May use oxygen, suction and manual treatment of airway obstruction as needed for comfort.      05/15/22 1152           Code  Status History     Date Active Date  Inactive Code Status Order ID Comments User Context   05/13/2022 0619 05/15/2022 1152 Full Code [id number]  Rise Patience, MD Inpatient   05/18/2019 1040 05/19/2019 1213 Full Code [id number]  Erroll Luna, MD Inpatient   01/24/2015 1322 01/25/2015 1653 Full Code [id number]  Marchia Bond, MD Inpatient      Advance Directive Documentation    Flowsheet Row Most Recent Value  Type of Advance Directive Living will, Healthcare Power of Attorney  Pre-existing out of facility DNR order (yellow form or pink MOST form) --  "MOST" Form in Place? --       Prognosis:  2-3 weeks.   Discharge Planning: Back to facility with hospice, [facility] consulted.   Care plan was discussed with patient wife and daughters who was at bedside  Thank you for allowing the Palliative Medicine Team to assist in the care of this patient.    MOD MDM.      Greater than 50%  of this time was spent counseling and coordinating care related to the above assessment and plan.  Loistine Chance, MD  Please contact Palliative Medicine Team phone at [phone] for questions and concerns.

## 2022-05-17 NOTE — Progress Notes (Signed)
Patient's O2 sat continued to desat into the low/mid 80%. Had patient cough/deep breath, increased O2 from 6L to 10L without relief, called RT. Administered duoneb per patient/family request and RT encouraged incentive spirometer (IS) use. Pt educated and attempted IS (goal 500 and reached 400). This nurse will continue with current plan of care.

## 2022-05-17 NOTE — Progress Notes (Signed)
This nurse and Jacqulyn Bath, RN attempted to in and out catheterize the patient per  the providers order. We were unsuccessful in the attempt with a straight catheter. This nurse called for a coude catheter and will rescan the patient at a later time and, if necessary, attempt a second in and out catheterization.

## 2022-05-17 NOTE — TOC Progression Note (Signed)
Transition of Care [facility]) - Progression Note    Patient Details  Name: George Barnett MRN: [id number] Date of Birth: 05/17/1933  Transition of Care [facility]) CM/SW [place], LCSW Phone Number: 05/17/2022, 2:50 PM  Clinical Narrative:    Met with pt as well as his daughter who were at bedside. CSW confirmed plan for this patient to discharge to [facility] with hospice care in place. Pt's daughter, George Barnett [phone]) would like to be the main point of contact for this pt. Family was unfamiliar with Hospice agencies and requested CSW to contact [name] to discuss which agencies they typically use. CSW spoke with [name] at [facility] and confirmed plan for pt to return to their SNF and determined hospice agency to be used. CSW referred pt to [facility] for Hospice services. TOC will continue to follow for discharge needs.    Expected Discharge Plan: [place] Barriers to Discharge: Continued Medical Work up  Expected Discharge Plan and Services Expected Discharge Plan: [place]   Discharge Planning Services: CM Consult Post Acute Care Choice: [place] Living arrangements for the past 2 months: [place]                                       Social Determinants of Health (SDOH) Interventions    Readmission Risk Interventions     No data to display

## 2022-05-17 NOTE — Progress Notes (Signed)
Speech Language Pathology Discharge Patient Details Name: George Barnett MRN: [id number] DOB: 28-Apr-1933 Today's Date: 05/17/2022 Time:  -     Patient discharged from SLP services secondary to  pt comfort care per chart review .  Please see latest therapy progress note for current level of functioning and progress toward goals.    Progress and discharge plan discussed with patient and/or caregiver:  n/a  Kathleen Lime, MS [place] Office [phone] Pager [phone]   [name] 05/17/2022, 3:29 PM

## 2022-05-17 NOTE — Progress Notes (Signed)
[place] for Infectious Disease  Date of Admission:  05/12/2022           Reason for visit: Follow up on pneumonia  Current antibiotics: Unasyn   ASSESSMENT:    88 y.o. male admitted with:  #Aspiration pneumonia: SLP eval yesterday with suspected component of esophageal deficits that may be contributing to aspiration.  Currently on Unasyn and Doxycycline discontinued due to interaction with acetretin.  Seen by GI today for esophageal deficits and recommending esophagram Monday pending clinical improvement.  No plans for inpatient GI work up at this time. Given worsening hypoxia overnight and continued fevers, CT this morning shows ground glass and peripheral opacities c/w pneumonia and small effusions.  No complicated effusion or empyema.     #Pustular dermatosis of the scalp: Longstanding issue for patient which has been recalcitrant to many topical therapies.  There was some concern about possible secondary infection, however, I think much of his clinical presentation is being driven by pneumonia.   #CKD stage III: Creatinine stable today.   #Sepsis and hypoxic respiratory failure: Secondary to pneumonia.   #Type 2 diabetes: Well-controlled on home medications.   #Skin cancer: Recent skin graft done to the left nares.  Does not appear infected and should not be disturbed and to follow-up with outpatient dermatology.  # Rash: suspected due to chlorhexidine wipes.  Does not appear c/w drug eruption.    RECOMMENDATIONS:    Continue Unasyn Stop doxycycline given interaction with Acetretin Appreciate GI eval Aspiration precautions Stop chlorhexidine wipes Will follow Dr West Bali here on Monday, I am available over the weekend.      Principal Problem:   CAP (community acquired pneumonia) Active Problems:   Type 2 diabetes mellitus with stage 3 chronic kidney disease, without long-term current use of insulin (HCC)   Erosive pustular dermatosis of scalp    Tremor of both hands-  both wiht rest and activity; acutely worse   Stage 3a chronic kidney disease (CKD) (HCC)   Squamous cell cancer of skin of nose   Weakness   Lower extremity weakness   Fever   Pressure injury of skin    MEDICATIONS:    Scheduled Meds:  acetaminophen  500 mg Oral Q6H   acitretin  10 mg Oral Q supper   aspirin EC  81 mg Oral QPM   atorvastatin  10 mg Oral QODAY   Chlorhexidine Gluconate Cloth  6 each Topical Daily   clobetasol ointment  1 Application Topical BID   enoxaparin (LOVENOX) injection  30 mg Subcutaneous Q24H   feeding supplement  237 mL Oral BID BM   fluticasone  1 spray Each Nare Daily   gabapentin  100 mg Oral TID   insulin aspart  0-6 Units Subcutaneous TID WC   ipratropium-albuterol  3 mL Nebulization BID   multivitamin with minerals  1 tablet Oral Daily   mouth rinse  15 mL Mouth Rinse 4 times per day   pantoprazole (PROTONIX) IV  40 mg Intravenous Q12H   polyethylene glycol  17 g Oral Daily   polyvinyl alcohol  1 drop Both Eyes Daily   primidone  25 mg Oral Daily   tacrolimus  1 Application Topical BID   tamsulosin  0.4 mg Oral Daily   terbinafine   Topical Daily   Continuous Infusions:  ampicillin-sulbactam (UNASYN) IV Stopped (05/17/22 0425)   PRN Meds:.acetaminophen **OR** acetaminophen, diphenhydrAMINE-zinc acetate, guaiFENesin-dextromethorphan, ipratropium-albuterol, lip balm, mouth rinse, oxyCODONE, polyvinyl alcohol  SUBJECTIVE:  24 hour events:  Seen by SLP yesterday Suspected component of esophageal deficits may be contributing to aspiration Esophagram on hold Febrile overnight to 101.8 and worsening oxygenation Abx transitioned to Unasyn Doxy discontinued due to side effect with acetretin MRSA nares PCR negative Bladder scan 496 WBC 7.9 Creat stable 1.5 Developed rash on abdomen and trunk yesterday  No new specific complaints.  He is tired of procedures and testing.  Feels that scalp is improved.    Review of  Systems  All other systems reviewed and are negative.     OBJECTIVE:   Blood pressure (!) 131/50, pulse 76, temperature 98.4 F (36.9 C), temperature source Axillary, resp. rate 20, height 6' (1.829 m), weight 64.8 kg, SpO2 95 %. Body mass index is 19.38 kg/m.  Physical Exam Constitutional:      General: He is not in acute distress.    Comments: Elderly man, lying in bed, NAD.  Daughter at bedside.   HENT:     Head:     Comments: Pustular lesions noted on scalp with crusting.  No significant drainage or erythema.  Appears stable/improved.  Eyes:     Extraocular Movements: Extraocular movements intact.     Conjunctiva/sclera: Conjunctivae normal.  Pulmonary:     Effort: Pulmonary effort is normal. No respiratory distress.     Comments: On nasal cannula.  Abdominal:     General: There is no distension.     Palpations: Abdomen is soft.  Musculoskeletal:     Cervical back: Normal range of motion and neck supple.  Skin:    General: Skin is warm and dry.     Findings: Rash present.  Neurological:     General: No focal deficit present.     Mental Status: He is alert and oriented to person, place, and time.  Psychiatric:        Mood and Affect: Mood normal.        Behavior: Behavior normal.      Lab Results: Lab Results  Component Value Date   WBC 7.9 05/17/2022   HGB 11.1 (L) 05/17/2022   HCT 32.7 (L) 05/17/2022   MCV 102.2 (H) 05/17/2022   PLT 269 05/17/2022    Lab Results  Component Value Date   NA 139 05/17/2022   K 4.4 05/17/2022   CO2 27 05/17/2022   GLUCOSE 155 (H) 05/17/2022   BUN 31 (H) 05/17/2022   CREATININE 1.54 (H) 05/17/2022   CALCIUM 9.0 05/17/2022   GFRNONAA 43 (L) 05/17/2022   GFRAA 58 (L) 09/06/2020    Lab Results  Component Value Date   ALT 12 05/13/2022   AST 16 05/13/2022   ALKPHOS 35 (L) 05/13/2022   BILITOT 0.6 05/13/2022    No results found for: "CRP"  No results found for: "ESRSEDRATE"   I have reviewed the micro and lab  results in Epic.  Imaging: ECHOCARDIOGRAM COMPLETE  Result Date: 05/16/2022    ECHOCARDIOGRAM REPORT   Patient Name:   George Barnett Date of Exam: 05/16/2022 Medical Rec #:  [id number]       Height:       72.0 in Accession #:    [id number]      Weight:       142.9 lb Date of Birth:  [birth year]       BSA:          1.847 m Patient Age:    39 years        BP:  117/42 mmHg Patient Gender: M               HR:           65 bpm. Exam Location:  Inpatient Procedure: 2D Echo, Cardiac Doppler and Color Doppler Indications:    Abnormal ECG  History:        Patient has prior history of Echocardiogram examinations, most                 recent 05/16/2021. CAD; Risk Factors:Diabetes.  Sonographer:    Jefferey Pica Referring Phys: [phone] BELKYS A REGALADO IMPRESSIONS  1. Left ventricular ejection fraction, by estimation, is 60 to 65%. The left ventricle has normal function. The left ventricle has no regional wall motion abnormalities. Left ventricular diastolic parameters are consistent with Grade I diastolic dysfunction (impaired relaxation).  2. Right ventricular systolic function is normal. The right ventricular size is normal. There is normal pulmonary artery systolic pressure. The estimated right ventricular systolic pressure is 12.8 mmHg.  3. The mitral valve is degenerative. Trivial mitral valve regurgitation. No evidence of mitral stenosis.  4. The aortic valve is tricuspid. Aortic valve regurgitation is not visualized. Aortic valve sclerosis/calcification is present, without any evidence of aortic stenosis. Aortic valve Vmax measures 1.48 m/s.  5. Aortic dilatation noted. There is borderline dilatation of the ascending aorta, measuring 37 mm.  6. The inferior vena cava is normal in size with greater than 50% respiratory variability, suggesting right atrial pressure of 3 mmHg. FINDINGS  Left Ventricle: Left ventricular ejection fraction, by estimation, is 60 to 65%. The left ventricle has normal function. The  left ventricle has no regional wall motion abnormalities. The left ventricular internal cavity size was normal in size. There is  no left ventricular hypertrophy. Left ventricular diastolic parameters are consistent with Grade I diastolic dysfunction (impaired relaxation). Normal left ventricular filling pressure. Right Ventricle: The right ventricular size is normal. No increase in right ventricular wall thickness. Right ventricular systolic function is normal. There is normal pulmonary artery systolic pressure. The tricuspid regurgitant velocity is 2.33 m/s, and  with an assumed right atrial pressure of 3 mmHg, the estimated right ventricular systolic pressure is 78.6 mmHg. Left Atrium: Left atrial size was normal in size. Right Atrium: Right atrial size was normal in size. Pericardium: There is no evidence of pericardial effusion. Mitral Valve: The mitral valve is degenerative in appearance. There is mild calcification of the anterior mitral valve leaflet(s). Trivial mitral valve regurgitation. No evidence of mitral valve stenosis. Tricuspid Valve: The tricuspid valve is normal in structure. Tricuspid valve regurgitation is trivial. No evidence of tricuspid stenosis. Aortic Valve: The aortic valve is tricuspid. Aortic valve regurgitation is not visualized. Aortic valve sclerosis/calcification is present, without any evidence of aortic stenosis. Aortic valve peak gradient measures 8.7 mmHg. Pulmonic Valve: The pulmonic valve was normal in structure. Pulmonic valve regurgitation is not visualized. No evidence of pulmonic stenosis. Aorta: Aortic dilatation noted. There is borderline dilatation of the ascending aorta, measuring 37 mm. Venous: The inferior vena cava is normal in size with greater than 50% respiratory variability, suggesting right atrial pressure of 3 mmHg. IAS/Shunts: No atrial level shunt detected by color flow Doppler.  LEFT VENTRICLE PLAX 2D LVIDd:         4.10 cm   Diastology LVIDs:         1.30  cm   LV e' medial:    6.64 cm/s LV PW:  0.90 cm   LV E/e' medial:  9.6 LV IVS:        0.80 cm   LV e' lateral:   8.38 cm/s LVOT diam:     2.00 cm   LV E/e' lateral: 7.6 LV SV:         77 LV SV Index:   42 LVOT Area:     3.14 cm  RIGHT VENTRICLE             IVC RV S prime:     15.40 cm/s  IVC diam: 1.20 cm LEFT ATRIUM             Index        RIGHT ATRIUM           Index LA diam:        2.40 cm 1.30 cm/m   RA Area:     15.00 cm LA Vol (A2C):   32.2 ml 17.44 ml/m  RA Volume:   39.30 ml  21.28 ml/m LA Vol (A4C):   28.9 ml 15.65 ml/m LA Biplane Vol: 32.5 ml 17.60 ml/m  AORTIC VALVE                 PULMONIC VALVE AV Area (Vmax): 2.90 cm     PV Vmax:       0.89 m/s AV Vmax:        147.50 cm/s  PV Peak grad:  3.2 mmHg AV Peak Grad:   8.7 mmHg LVOT Vmax:      136.00 cm/s LVOT Vmean:     82.600 cm/s LVOT VTI:       0.246 m  AORTA Ao Root diam: 2.80 cm Ao Asc diam:  3.70 cm MITRAL VALVE               TRICUSPID VALVE MV Area (PHT): 2.96 cm    TR Peak grad:   21.7 mmHg MV Decel Time: 256 msec    TR Vmax:        233.00 cm/s MV E velocity: 63.60 cm/s MV A velocity: 89.30 cm/s  SHUNTS MV E/A ratio:  0.71        Systemic VTI:  0.25 m                            Systemic Diam: 2.00 cm Fransico Him MD Electronically signed by Fransico Him MD Signature Date/Time: 05/16/2022/1:05:32 PM    Final    DG CHEST PORT 1 VIEW  Result Date: 05/16/2022 CLINICAL DATA:  Increasing shortness of breath EXAM: PORTABLE CHEST 1 VIEW COMPARISON:  05/12/2022 FINDINGS: Heart size and pulmonary vascularity are normal. Patchy infiltrates in both mid and lower lung zones, increasing since prior study, likely multifocal pneumonia. Edema less likely. Probable small pleural effusions. No pneumothorax. Degenerative changes in the spine. Postoperative change in the right shoulder. IMPRESSION: Increasing patchy infiltration in both mid and lower lung zones, likely multifocal pneumonia. Edema less likely. Small pleural effusions. Electronically  Signed   By: Lucienne Capers M.D.   On: 05/16/2022 01:31     Imaging independently reviewed in Epic.    [facility] [phone] pager 05/17/2022, 8:00 AM

## 2022-05-17 NOTE — Progress Notes (Addendum)
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  Neuro exam is nonfocal.  Chest x-ray showed left lower lobe atelectasis.  COVID and influenza negative.  Admitted with persistent fevers and symptoms.  Patient continued to spike fever despite being on Tylenol.  ID consulted for further evaluation of scalp dermatosis and concern for superimposed infection.  Patient develops Worsening respiratory failure overnight 6/29. He required up to 15 L oxygen. He received 2 doses of IV lasix which improved urine out put. Chest x ray is concerning with worsening multifocal PNA> Antibiotics change to Unasyn. Speech therapy evaluation. Oxygen requirement this morning has decreased to 6 L.    Assessment & Plan:   Principal Problem:   CAP (community acquired pneumonia) Active Problems:   Type 2 diabetes mellitus with stage 3 chronic kidney disease, without long-term current use of insulin (HCC)   Erosive pustular dermatosis of scalp   Tremor of both hands-  both wiht rest and activity; acutely worse   Stage 3a chronic kidney disease (CKD) (HCC)   Squamous cell cancer of skin of nose   Weakness   Lower extremity weakness   Fever   Pressure injury of skin  1-Acute Hypoxic Respiratory failure. Aspiration PNA>  Suspect related to PNA, component of pulmonary edema after he received IV fluids. More likely aspiration PNA>  Continue to require 6 L oxygen.  Hold lasix. Required IV bolus last night for hypotension.  ECHO normal EF.  Continue  Unasyn.  Schedule Nebulizer BID>  MBS yesterday concern for esophageal dysphagia. Retention fluid esophagus.   2-Esophageal Dysphagia;  MBS  concern of fluid in the esophagus.  Started IV Protonix BID>  Explain to patient benefit of esophagogram to rule out stricture, mass, obstruction of esophagus, presby esophagus. Patient report feeling of getting tired of procedure and interventions, he was initially willing to proceed with esophagogram, but after discussion with GI, is better to hold on esophagogram for now until his respiratory status is more stable.  Palliative was going to meet with patient and family for goals of care.   3-Multifocal PNA;  Also concern for superimposed cellulitis of the scalp ID consulted and following.  Azithromycin changed to doxycycline to cover for cellulitis of the scalp Blood cultures negative  Lactic acid at 2. . Received IV bolus 6/28.Marland Kitchen  Continue with Unasyn.  CT chest: widespread ground glass and peripheral consolidative opacities.   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 He follows at [facility] with dermatologist. Attending physician on 6/27 spoke with dermatologist from [facility]. Recommendation was to continue acitretin, as this is less likely to contribute to current presentation. Plan to discontinue dapsone as it may cause weakness. She recommended continued topical treatment with tacrolimus and clobetasol. Patient does not wishes at this point to continue with topical treatment as it is very painful.  I offered oxycodone as needed for pain. ID consulted for evaluation of ? Superimpose cellulitis. Azitro change to Doxycycline.  Wound care consulted appreciate recommendations.   4-Chronic iron deficiency anemia: Stable  Diabetes type 2: Continue with a sliding scale.  Hold metformin and Jardiance.  Bilateral lower extremity weakness: Suspect related to debility  and generalized weakness. He was able to ambulate in the hall.  No significant focal weakness. MRI thoracic and lumbar spine reviewed with  neurosurgeon on-call, no significant nerve  encroachment, compression.  Continue with PT>   Prostatism;  He had some urinary retention. Started on Flomax.  Continue with bladder scan.   AKI on CKD stage IIIa Hold Cozaar. Prior cr per record at 1.4.  Strict I and . Bladder scan.  Renal function stable at 1.5  Hyponatremia; Received IV fluids.    Essential tremors, continue with primidone   Skin cancer: Has a skin graft procedure done, do not remove dressing. Wound care consulted Pressure injury right and left mid buttock  stage 1;present on admission; local care  Nutrition Problem: Increased nutrient needs Etiology: acute illness    Signs/Symptoms: estimated needs    Interventions: Ensure Enlive (each supplement provides 350kcal and 20 grams of protein), MVI, Liberalize Diet  Estimated body mass index is 19.38 kg/m as calculated from the following:   Height as of this encounter: 6' (1.829 m).   Weight as of this encounter: 64.8 kg.   DVT prophylaxis:  Code Status: DNR, patient confirm code status of DNR Family Communication: Daughter at bedside.  Disposition Plan:  Status is: Inpatient Remains inpatient appropriate because: fever    Consultants:  ID  Procedures:  None  Antimicrobials:    Subjective: He is not feeling well, he is tired, wearing out . He has been saying that he does not wants more procedure.   Objective: Vitals:   05/17/22 0600 05/17/22 0700 05/17/22 0800 05/17/22 0806  BP: (!) 131/50 (!) 136/44    Pulse: 76 77 77   Resp: 20 20 (!) 21   Temp:    98.5 F (36.9 C)  TempSrc:    Oral  SpO2: 95% 92% 95% 96%  Weight:      Height:        Intake/Output Summary (Last 24 hours) at 05/17/2022 9562 Last data filed at 05/17/2022 0300 Gross per 24 hour  Intake 303.32 ml  Output 650 ml  Net -346.68 ml    Filed Weights   05/12/22 2203 05/13/22 0510 05/16/22 0213  Weight: 65.3 kg 63.2 kg 64.8 kg    Examination:  General exam:  NAD Respiratory system: BL ronchus Cardiovascular system: S 1, S 2 RRR Gastrointestinal system: BS present, soft, nt Central nervous system: Alert, answer questions.  Extremities: No edema  Data Reviewed: I have personally reviewed following labs and imaging studies  CBC: Recent Labs  Lab 05/12/22 2326 05/13/22 0552 05/13/22 0619 05/15/22 1119 05/16/22 0326 05/17/22 1308  WBC 7.4 DUPLICATE REQUEST 7.2 7.3 8.0 7.9  NEUTROABS 5.4 DUPLICATE REQUEST 4.7  --   --   --   HGB 65.7* DUPLICATE REQUEST 84.6* 10.4* 10.9* 96.2*  HCT 95.2* DUPLICATE REQUEST 84.1* 30.9* 31.8* 32.7*  MCV 324.4* DUPLICATE REQUEST 010.2* 103.0* 101.3* 725.3*  PLT 664 DUPLICATE REQUEST 403 474 236 259    Basic Metabolic Panel: Recent Labs  Lab 05/12/22 2326 05/13/22 0552 05/15/22 1119 05/16/22 0326 05/17/22 0331  NA 138 138 133* 137 139  K 4.4 4.2 4.3 4.3 4.4  CL 102 103 97* 99 101  CO2 '27 27 27 29 27  '$ GLUCOSE 159* 120* 270* 198* 155*  BUN 41* 37* 34* 30* 31*  CREATININE 1.64* 1.62* 1.61* 1.57* 1.54*  CALCIUM 9.5 8.9 9.3 9.5 9.0  MG  --   --   --  1.7  --  GFR: Estimated Creatinine Clearance: 31 mL/min (A) (by C-G formula based on SCr of 1.54 mg/dL (H)). Liver Function Tests: Recent Labs  Lab 05/12/22 2326 05/13/22 0552  AST 16 16  ALT 14 12  ALKPHOS 39 35*  BILITOT 0.7 0.6  PROT 6.0* 5.5*  ALBUMIN 3.1* 2.8*    No results for input(s): "LIPASE", "AMYLASE" in the last 168 hours. No results for input(s): "AMMONIA" in the last 168 hours. Coagulation Profile: Recent Labs  Lab 05/12/22 2326  INR 1.1    Cardiac Enzymes: Recent Labs  Lab 05/13/22 0552  CKTOTAL 32*    BNP (last 3 results) No results for input(s): "PROBNP" in the last 8760 hours. HbA1C: No results for input(s): "HGBA1C" in the last 72 hours. CBG: Recent Labs  Lab 05/16/22 0723 05/16/22 1140 05/16/22 1640 05/16/22 2143 05/17/22 0750  GLUCAP 192* 193* 150* 140* 165*    Lipid Profile: No results for  input(s): "CHOL", "HDL", "LDLCALC", "TRIG", "CHOLHDL", "LDLDIRECT" in the last 72 hours. Thyroid Function Tests: No results for input(s): "TSH", "T4TOTAL", "FREET4", "T3FREE", "THYROIDAB" in the last 72 hours.  Anemia Panel: No results for input(s): "VITAMINB12", "FOLATE", "FERRITIN", "TIBC", "IRON", "RETICCTPCT" in the last 72 hours.  Sepsis Labs: Recent Labs  Lab 05/13/22 0100 05/13/22 0325 05/15/22 1119 05/15/22 1204 05/15/22 1532  PROCALCITON  --   --  0.14  --   --   LATICACIDVEN 1.1 1.0  --  2.0* 1.4     Recent Results (from the past 240 hour(s))  Urine Culture     Status: None   Collection Time: 05/12/22 11:26 PM   Specimen: In/Out Cath Urine  Result Value Ref Range Status   Specimen Description   Final    IN/OUT CATH URINE Performed at [facility], [place] [address] [place], [place] [address]    Special Requests   Final    NONE Performed at [facility], [place] [address], [place], [place] [address]    Culture   Final    NO GROWTH Performed at [facility], [place] [address] [place], [place] [address]    Report Status 05/14/2022 FINAL  Final  Resp Panel by RT-PCR (Flu A&B, Covid) Anterior Nasal Swab     Status: None   Collection Time: 05/12/22 11:26 PM   Specimen: Anterior Nasal Swab  Result Value Ref Range Status   SARS Coronavirus 2 by RT PCR NEGATIVE NEGATIVE Final    Comment: (NOTE) SARS-CoV-2 target nucleic acids are NOT DETECTED.  The SARS-CoV-2 RNA is generally detectable in upper respiratory specimens during the acute phase of infection. The lowest concentration of SARS-CoV-2 viral copies this assay can detect is 138 copies/mL. A negative result does not preclude SARS-Cov-2 infection and should not be used as the sole basis for treatment or other patient management decisions. A negative result may occur with  improper specimen collection/handling, submission of specimen other than nasopharyngeal swab, presence  of viral mutation(s) within the areas targeted by this assay, and inadequate number of viral copies(<138 copies/mL). A negative result must be combined with clinical observations, patient history, and epidemiological information. The expected result is Negative.  Fact Sheet for Patients:  EntrepreneurPulse.com.au  Fact Sheet for Healthcare Providers:  IncredibleEmployment.be  This test is no t yet approved or cleared by the [place] FDA and  has been authorized for detection and/or diagnosis of SARS-CoV-2 by FDA under an Emergency Use Authorization (EUA). This EUA will remain  in effect (meaning this test  can be used) for the duration of the COVID-19 declaration under Section 564(b)(1) of the Act, 21 U.S.C.section 360bbb-3(b)(1), unless the authorization is terminated  or revoked sooner.       Influenza A by PCR NEGATIVE NEGATIVE Final   Influenza B by PCR NEGATIVE NEGATIVE Final    Comment: (NOTE) The Xpert Xpress SARS-CoV-2/FLU/RSV plus assay is intended as an aid in the diagnosis of influenza from Nasopharyngeal swab specimens and should not be used as a sole basis for treatment. Nasal washings and aspirates are unacceptable for Xpert Xpress SARS-CoV-2/FLU/RSV testing.  Fact Sheet for Patients: EntrepreneurPulse.com.au  Fact Sheet for Healthcare Providers: IncredibleEmployment.be  This test is not yet approved or cleared by the [place] FDA and has been authorized for detection and/or diagnosis of SARS-CoV-2 by FDA under an Emergency Use Authorization (EUA). This EUA will remain in effect (meaning this test can be used) for the duration of the COVID-19 declaration under Section 564(b)(1) of the Act, 21 U.S.C. section 360bbb-3(b)(1), unless the authorization is terminated or revoked.  Performed at [facility], [place] [address], [place], [place] [address]   Blood culture  (routine x 2)     Status: None (Preliminary result)   Collection Time: 05/13/22  1:00 AM   Specimen: BLOOD  Result Value Ref Range Status   Specimen Description   Final    BLOOD RIGHT ANTECUBITAL Performed at [place] [address] [place], [place] [address]    Special Requests   Final    BOTTLES DRAWN AEROBIC AND ANAEROBIC Blood Culture results may not be optimal due to an excessive volume of blood received in culture bottles Performed at [place] [address], [place], [place] [address]    Culture   Final    NO GROWTH 3 DAYS Performed at [facility], [place] [address], [place], [place] [address]    Report Status PENDING  Incomplete  Blood culture (routine x 2)     Status: None (Preliminary result)   Collection Time: 05/13/22  5:52 AM   Specimen: BLOOD  Result Value Ref Range Status   Specimen Description   Final    BLOOD RIGHT ANTECUBITAL Performed at [place] [address] [place], [place] [address]    Special Requests   Final    BOTTLES DRAWN AEROBIC AND ANAEROBIC Blood Culture adequate volume Performed at [place] [address] [place], [place] [address]    Culture   Final    NO GROWTH 3 DAYS Performed at [facility], [place] [address], [place], [place] [address]    Report Status PENDING  Incomplete  Respiratory (~20 pathogens) panel by PCR     Status: None   Collection Time: 05/15/22  2:01 PM   Specimen: Nasopharyngeal Swab; Respiratory  Result Value Ref Range Status   Adenovirus NOT DETECTED NOT DETECTED Final   Coronavirus 229E NOT DETECTED NOT DETECTED Final    Comment: (NOTE) The Coronavirus on the Respiratory Panel, DOES NOT test for the novel  Coronavirus (2019 nCoV)    Coronavirus HKU1 NOT DETECTED NOT DETECTED Final   Coronavirus NL63 NOT DETECTED NOT DETECTED Final   Coronavirus OC43 NOT DETECTED NOT DETECTED Final   Metapneumovirus NOT DETECTED  NOT DETECTED Final   Rhinovirus / Enterovirus NOT DETECTED NOT DETECTED Final   Influenza A NOT DETECTED NOT DETECTED Final   Influenza B NOT DETECTED NOT DETECTED Final   Parainfluenza Virus  1 NOT DETECTED NOT DETECTED Final   Parainfluenza Virus 2 NOT DETECTED NOT DETECTED Final   Parainfluenza Virus 3 NOT DETECTED NOT DETECTED Final   Parainfluenza Virus 4 NOT DETECTED NOT DETECTED Final   Respiratory Syncytial Virus NOT DETECTED NOT DETECTED Final   Bordetella pertussis NOT DETECTED NOT DETECTED Final   Bordetella Parapertussis NOT DETECTED NOT DETECTED Final   Chlamydophila pneumoniae NOT DETECTED NOT DETECTED Final   Mycoplasma pneumoniae NOT DETECTED NOT DETECTED Final    Comment: Performed at [facility], [place] [address], [place], [place] [address]  MRSA Next Gen by PCR, Nasal     Status: None   Collection Time: 05/16/22 10:52 AM   Specimen: Urine, Clean Catch; Nasal Swab  Result Value Ref Range Status   MRSA by PCR Next Gen NOT DETECTED NOT DETECTED Final    Comment: (NOTE) The GeneXpert MRSA Assay (FDA approved for NASAL specimens only), is one component of a comprehensive MRSA colonization surveillance program. It is not intended to diagnose MRSA infection nor to guide or monitor treatment for MRSA infections. Test performance is not FDA approved in patients less than 37 years old. Performed at [facility], [place] [address], [place], [place] [address]          Radiology Studies: ECHOCARDIOGRAM COMPLETE  Result Date: 05/16/2022    ECHOCARDIOGRAM REPORT   Patient Name:   DAVONN FLANERY Date of Exam: 05/16/2022 Medical Rec #:  [id number]       Height:       72.0 in Accession #:    [id number]      Weight:       142.9 lb Date of Birth:  1933/02/24       BSA:          1.847 m Patient Age:    29 years        BP:           117/42 mmHg Patient Gender: M               HR:           65 bpm. Exam Location:  Inpatient Procedure: 2D Echo, Cardiac Doppler and  Color Doppler Indications:    Abnormal ECG  History:        Patient has prior history of Echocardiogram examinations, most                 recent 05/16/2021. CAD; Risk Factors:Diabetes.  Sonographer:    Jefferey Pica Referring Phys: [phone] Marcell Chavarin A Gabrielly Mccrystal IMPRESSIONS  1. Left ventricular ejection fraction, by estimation, is 60 to 65%. The left ventricle has normal function. The left ventricle has no regional wall motion abnormalities. Left ventricular diastolic parameters are consistent with Grade I diastolic dysfunction (impaired relaxation).  2. Right ventricular systolic function is normal. The right ventricular size is normal. There is normal pulmonary artery systolic pressure. The estimated right ventricular systolic pressure is 71.2 mmHg.  3. The mitral valve is degenerative. Trivial mitral valve regurgitation. No evidence of mitral stenosis.  4. The aortic valve is tricuspid. Aortic valve regurgitation is not visualized. Aortic valve sclerosis/calcification is present, without any evidence of aortic stenosis. Aortic valve Vmax measures 1.48 m/s.  5. Aortic dilatation noted. There is borderline dilatation of the ascending aorta, measuring 37 mm.  6. The inferior vena cava is normal in size with greater than 50% respiratory variability, suggesting right atrial pressure of 3 mmHg. FINDINGS  Left Ventricle: Left ventricular ejection  fraction, by estimation, is 60 to 65%. The left ventricle has normal function. The left ventricle has no regional wall motion abnormalities. The left ventricular internal cavity size was normal in size. There is  no left ventricular hypertrophy. Left ventricular diastolic parameters are consistent with Grade I diastolic dysfunction (impaired relaxation). Normal left ventricular filling pressure. Right Ventricle: The right ventricular size is normal. No increase in right ventricular wall thickness. Right ventricular systolic function is normal. There is normal pulmonary artery  systolic pressure. The tricuspid regurgitant velocity is 2.33 m/s, and  with an assumed right atrial pressure of 3 mmHg, the estimated right ventricular systolic pressure is 14.4 mmHg. Left Atrium: Left atrial size was normal in size. Right Atrium: Right atrial size was normal in size. Pericardium: There is no evidence of pericardial effusion. Mitral Valve: The mitral valve is degenerative in appearance. There is mild calcification of the anterior mitral valve leaflet(s). Trivial mitral valve regurgitation. No evidence of mitral valve stenosis. Tricuspid Valve: The tricuspid valve is normal in structure. Tricuspid valve regurgitation is trivial. No evidence of tricuspid stenosis. Aortic Valve: The aortic valve is tricuspid. Aortic valve regurgitation is not visualized. Aortic valve sclerosis/calcification is present, without any evidence of aortic stenosis. Aortic valve peak gradient measures 8.7 mmHg. Pulmonic Valve: The pulmonic valve was normal in structure. Pulmonic valve regurgitation is not visualized. No evidence of pulmonic stenosis. Aorta: Aortic dilatation noted. There is borderline dilatation of the ascending aorta, measuring 37 mm. Venous: The inferior vena cava is normal in size with greater than 50% respiratory variability, suggesting right atrial pressure of 3 mmHg. IAS/Shunts: No atrial level shunt detected by color flow Doppler.  LEFT VENTRICLE PLAX 2D LVIDd:         4.10 cm   Diastology LVIDs:         1.30 cm   LV e' medial:    6.64 cm/s LV PW:         0.90 cm   LV E/e' medial:  9.6 LV IVS:        0.80 cm   LV e' lateral:   8.38 cm/s LVOT diam:     2.00 cm   LV E/e' lateral: 7.6 LV SV:         77 LV SV Index:   42 LVOT Area:     3.14 cm  RIGHT VENTRICLE             IVC RV S prime:     15.40 cm/s  IVC diam: 1.20 cm LEFT ATRIUM             Index        RIGHT ATRIUM           Index LA diam:        2.40 cm 1.30 cm/m   RA Area:     15.00 cm LA Vol (A2C):   32.2 ml 17.44 ml/m  RA Volume:   39.30 ml   21.28 ml/m LA Vol (A4C):   28.9 ml 15.65 ml/m LA Biplane Vol: 32.5 ml 17.60 ml/m  AORTIC VALVE                 PULMONIC VALVE AV Area (Vmax): 2.90 cm     PV Vmax:       0.89 m/s AV Vmax:        147.50 cm/s  PV Peak grad:  3.2 mmHg AV Peak Grad:   8.7 mmHg LVOT Vmax:      136.00  cm/s LVOT Vmean:     82.600 cm/s LVOT VTI:       0.246 m  AORTA Ao Root diam: 2.80 cm Ao Asc diam:  3.70 cm MITRAL VALVE               TRICUSPID VALVE MV Area (PHT): 2.96 cm    TR Peak grad:   21.7 mmHg MV Decel Time: 256 msec    TR Vmax:        233.00 cm/s MV E velocity: 63.60 cm/s MV A velocity: 89.30 cm/s  SHUNTS MV E/A ratio:  0.71        Systemic VTI:  0.25 m                            Systemic Diam: 2.00 cm Fransico Him MD Electronically signed by Fransico Him MD Signature Date/Time: 05/16/2022/1:05:32 PM    Final    DG CHEST PORT 1 VIEW  Result Date: 05/16/2022 CLINICAL DATA:  Increasing shortness of breath EXAM: PORTABLE CHEST 1 VIEW COMPARISON:  05/12/2022 FINDINGS: Heart size and pulmonary vascularity are normal. Patchy infiltrates in both mid and lower lung zones, increasing since prior study, likely multifocal pneumonia. Edema less likely. Probable small pleural effusions. No pneumothorax. Degenerative changes in the spine. Postoperative change in the right shoulder. IMPRESSION: Increasing patchy infiltration in both mid and lower lung zones, likely multifocal pneumonia. Edema less likely. Small pleural effusions. Electronically Signed   By: Lucienne Capers M.D.   On: 05/16/2022 01:31        Scheduled Meds:  acetaminophen  500 mg Oral Q6H   acitretin  10 mg Oral Q supper   aspirin EC  81 mg Oral QPM   atorvastatin  10 mg Oral QODAY   clobetasol ointment  1 Application Topical BID   enoxaparin (LOVENOX) injection  30 mg Subcutaneous Q24H   feeding supplement  237 mL Oral BID BM   fluticasone  1 spray Each Nare Daily   gabapentin  100 mg Oral TID   insulin aspart  0-6 Units Subcutaneous TID WC    ipratropium-albuterol  3 mL Nebulization BID   multivitamin with minerals  1 tablet Oral Daily   mouth rinse  15 mL Mouth Rinse 4 times per day   pantoprazole (PROTONIX) IV  40 mg Intravenous Q12H   polyethylene glycol  17 g Oral Daily   polyvinyl alcohol  1 drop Both Eyes Daily   primidone  25 mg Oral Daily   tacrolimus  1 Application Topical BID   tamsulosin  0.4 mg Oral Daily   terbinafine   Topical Daily   Continuous Infusions:  ampicillin-sulbactam (UNASYN) IV Stopped (05/17/22 0425)     LOS: 3 days    Time spent: 45 minutes    Amiyrah Lamere A Ernestina Joe, MD [facility]   If 7PM-7AM, please contact night-coverage [email or web address]  05/17/2022, 9:22 AM

## 2022-05-17 NOTE — Consult Note (Signed)
Referring Provider:  [facility] Primary Care Physician:  Haydee Salter, MD Primary Gastroenterologist:  Dr. Paulita Fujita  Reason for Consultation: Dysphagia  HPI: George Barnett is a 88 y.o. male with past medical history of coronary artery disease, chronic kidney disease, diabetes, pustular dermatitis of scalp, hypertension and GERD presented to the hospital with weakness.  Was subsequently diagnosed with suspected aspiration pneumonia.  Underwent speech evaluation with modified barium swallow which showed retention of the barium in the esophagus without patient's awareness.  GI was consulted for further evaluation.  Patient seen and examined at bedside.  Multiple family members at bedside.  According to patient and family, he did not had any following issue until 2 to 4 weeks ago when he started having coughing and choking while swallowing solid food or liquid.  Sometimes choking sensation just with drinking water.  Patient has sensation of food stuck in the upper throat followed by coughing sensation and sometimes brings up undigested food with coughing.    Past Medical History:  Diagnosis Date   Abdominal pain 03/25/2017   Anemia due to chemical agent (HCC)-dermatologic medications most likely 12/08/2019   Atypical chest pain 05/07/2019   Benign essential tremor 12/08/2019   Bilateral lower extremity edema 11/17/2017   Brittle diabetes mellitus ([place]) 01/12/2020   CAD (coronary artery disease) 03/25/2017   Cancer [facility])    SKIN CANCER    Cataract    Chronic head/ "scalp" pain 01/12/2020   Per patient the pain of his scalp is like "bees stinging you all over on your head "he says the slightest touch to the area causes significant pain/discomfort   Costochondritis 05/05/2017   --->pt had mentioned costochondritis at his last office visit with his former PCP prior to transferring care to [place]:  [name], MD - 02/24/2017 10:30 AM EDT Formatting of this note may be different from the original. George Barnett  is a 88 y.o. male who is here for a follow up visit.  DM: He is taking metformin and glimepiride and tolerates these well. His A1C was 7.1 on recent labs.  HTN: He   Decreased activity 12/08/2019   Dehydration- mild 12/08/2019   Diabetes mellitus without complication (HCC)    Edema, peripheral 11/22/2014   Elbow effusion, left    Erosive pustular dermatosis of scalp 7/40/8144   Folliculitis- scalp; txed by Derm- Dr Jarome Matin 07/27/2018   Frequency of urination    GAD (generalized anxiety disorder) 12/10/2019   GERD (gastroesophageal reflux disease)    Glossodynia 05/05/2017   [name], MD - 11/22/2016 2:15 PM EST Formatting of this note may be different from the original. Otolaryngology Initial Consultation Note  Requesting Attending Physician: [name], MD Service Requesting Consult: No service for patient encounter.  History of Present Illness:   The patient is being seen in consultation with [name], MD for the evaluation of tongu   Heart attack [facility])    Heart disease    High risk medication use 01/15/2019   Hypertension    Hypertension associated with diabetes ([place]) 08/05/2017   Microalbuminuria due to type 2 diabetes mellitus ([place]) 08/05/2017   Mixed diabetic hyperlipidemia associated with type 2 diabetes mellitus ([place]) 08/05/2017   Orthostatic hypotension 11/05/2018   Primary osteoarthritis of right shoulder 01/24/2015   Prostate enlargement    Recurrent right inguinal hernia 05/18/2019   RLS (restless legs syndrome) 06/17/2017   Snoring 03/25/2017   Stage 3 chronic kidney disease ([place]) 04/22/2017   Stage 4 chronic kidney  disease ([place]) 09/08/2020   Stress and adjustment reaction 01/12/2020   Tremor of both hands-  both wiht rest and activity; acutely worse 11/27/2018   Tremors of nervous system    Type 2 diabetes mellitus with stage 3 chronic kidney disease, without long-term current use of insulin ([place]) 05/05/2017   Urinary retention due to benign prostatic  hyperplasia 01/25/2015   Venous insufficiency of both lower extremities 11/27/2018   White coat syndrome with diagnosis of hypertension 03/10/2020    Past Surgical History:  Procedure Laterality Date   CARDIAC CATHETERIZATION     1999   cardio stint     HERNIA REPAIR     BILAT    INGUINAL HERNIA REPAIR  05/18/2019   INGUINAL HERNIA REPAIR Right 05/18/2019   Procedure: REPAIR RIGHT INGUINAL HERNIA WITH MESH;  Surgeon: Erroll Luna, MD;  Location: [place];  Service: General;  Laterality: Right;   MOHS SURGERY     BILAT EARS    PROSTATE BIOPSY     TOTAL SHOULDER ARTHROPLASTY Right 01/24/2015   Procedure: RIGHT TOTAL SHOULDER ARTHROPLASTY;  Surgeon: Marchia Bond, MD;  Location: [place];  Service: Orthopedics;  Laterality: Right;    Prior to Admission medications   Medication Sig Start Date End Date Taking? Authorizing Provider  acitretin (SORIATANE) 10 MG capsule Take 10 mg by mouth daily before breakfast. 03/04/22  Yes [provider]  aspirin 81 MG tablet Take 81 mg by mouth every evening.    Yes [provider]  atorvastatin (LIPITOR) 10 MG tablet TAKE 1 TABLET EVERY NIGHT  BEFORE BED Patient taking differently: Take 10 mg by mouth every other day. 04/09/22  Yes Haydee Salter, MD  clobetasol ointment (TEMOVATE) 9.70 % 1 Application 2 (two) times daily. 01/02/22  Yes [provider]  furosemide (LASIX) 20 MG tablet Take 1 tablet (20 mg total) by mouth daily. Patient taking differently: Take 20 mg by mouth daily as needed for edema. 04/26/21  Yes Park Liter, MD  gabapentin (NEURONTIN) 100 MG capsule Take 1 capsule (100 mg total) by mouth 2 (two) times daily. 04/29/22  Yes Haydee Salter, MD  glimepiride (AMARYL) 1 MG tablet TAKE 1 TABLET DAILY WITH   BREAKFAST. Patient taking differently: Take 1 mg by mouth daily with breakfast. 05/12/22  Yes Haydee Salter, MD  ipratropium (ATROVENT) 0.03 % nasal spray Place 2 sprays into both nostrils daily as needed for  rhinitis. 04/01/22  Yes [provider]  JARDIANCE 25 MG TABS tablet TAKE 1 TABLET DAILY BEFORE BREAKFAST Patient taking differently: Take 25 mg by mouth daily. 04/07/22  Yes Haydee Salter, MD  losartan (COZAAR) 25 MG tablet TAKE 1 TABLET DAILY Patient taking differently: Take 25 mg by mouth daily. 05/06/22  Yes Haydee Salter, MD  metFORMIN (GLUCOPHAGE) 500 MG tablet TAKE 1 TABLET TWICE DAILY  WITH MEALS Patient taking differently: Take 500 mg by mouth 2 (two) times daily with a meal. 05/06/22  Yes Rudd, Lillette Boxer, MD  Polyethyl Glycol-Propyl Glycol (SYSTANE OP) Place 1 drop into both eyes daily. Unknown stregth   Yes [provider]  polyethylene glycol (MIRALAX / GLYCOLAX) packet Take 17 g by mouth daily as needed for mild constipation.    Yes [provider]  primidone (MYSOLINE) 50 MG tablet Take 1 tablet (50 mg total) by mouth daily. Patient taking differently: Take 25 mg by mouth daily. 04/10/22  Yes Lomax, Amy, NP  Probiotic Product (PROBIOTIC DAILY PO) Take 1 capsule  by mouth daily with lunch. Unknown strength   Yes [provider]  tacrolimus (PROTOPIC) 0.1 % ointment Apply 1 application topically 2 (two) times daily. 06/15/20  Yes [provider]  terbinafine (LAMISIL) 1 % cream Apply twice daily to lesions on the left leg until resolved. 02/07/22  Yes [provider]  cephALEXin (KEFLEX) 500 MG capsule Take 500 mg by mouth 2 (two) times daily. Patient not taking: Reported on 05/13/2022 04/30/22   [provider]  dapsone 25 MG tablet For one week, take 50 mg (two tablets) TWO times daily. Then, take 50 mg (two tablets) THREE times daily, continue this until your next appointment. Patient not taking: Reported on 05/13/2022 05/10/22   [provider]  folic acid (FOLVITE) 1 MG tablet Take 1 mg by mouth daily. except Mondays Patient not taking: Reported on 05/13/2022    [provider]  methotrexate (RHEUMATREX) 2.5  MG tablet Take 15 mg by mouth once a week. Patient not taking: Reported on 05/13/2022 07/09/21   [provider]  predniSONE (DELTASONE) 10 MG tablet Take by mouth. Patient not taking: Reported on 05/13/2022 04/16/22   [provider]    Scheduled Meds:  acetaminophen  500 mg Oral Q6H   acitretin  10 mg Oral Q supper   aspirin EC  81 mg Oral QPM   atorvastatin  10 mg Oral QODAY   clobetasol ointment  1 Application Topical BID   enoxaparin (LOVENOX) injection  30 mg Subcutaneous Q24H   feeding supplement  237 mL Oral BID BM   fluticasone  1 spray Each Nare Daily   gabapentin  100 mg Oral TID   insulin aspart  0-6 Units Subcutaneous TID WC   ipratropium-albuterol  3 mL Nebulization BID   multivitamin with minerals  1 tablet Oral Daily   mouth rinse  15 mL Mouth Rinse 4 times per day   pantoprazole (PROTONIX) IV  40 mg Intravenous Q12H   polyethylene glycol  17 g Oral Daily   polyvinyl alcohol  1 drop Both Eyes Daily   primidone  25 mg Oral Daily   tacrolimus  1 Application Topical BID   tamsulosin  0.4 mg Oral Daily   terbinafine   Topical Daily   Continuous Infusions:  ampicillin-sulbactam (UNASYN) IV Stopped (05/17/22 0425)   PRN Meds:.acetaminophen **OR** acetaminophen, diphenhydrAMINE-zinc acetate, guaiFENesin-dextromethorphan, ipratropium-albuterol, lip balm, mouth rinse, oxyCODONE, polyvinyl alcohol  Allergies as of 05/12/2022 - Review Complete 05/12/2022  Allergen Reaction Noted   Exenatide Nausea And Vomiting 03/07/2014   Meperidine and related Other (See Comments) 03/07/2014   Nadolol Other (See Comments) 03/07/2014   Sitagliptin phosphate [sitagliptin] Other (See Comments) 03/07/2014    Family History  Problem Relation Age of Onset   Hypertension Brother     Social History   Socioeconomic History   Marital status: Married    Spouse name: Not on file   Number of children: Not on file   Years of education: Not on file   Highest education level:  Not on file  Occupational History   Not on file  Tobacco Use   Smoking status: Never   Smokeless tobacco: Never  Vaping Use   Vaping Use: Never used  Substance and Sexual Activity   Alcohol use: No   Drug use: No   Sexual activity: Not on file  Other Topics Concern   Not on file  Social History Narrative   Not on file   Social Determinants of Health  Financial Resource Strain: Low Risk  (05/01/2022)   Overall Financial Resource Strain (CARDIA)    Difficulty of Paying Living Expenses: Not hard at all  Food Insecurity: No Food Insecurity (05/01/2022)   Hunger Vital Sign    Worried About Running Out of Food in the Last Year: Never true    Ran Out of Food in the Last Year: Never true  Transportation Needs: No Transportation Needs (05/01/2022)   PRAPARE - Hydrologist (Medical): No    Lack of Transportation (Non-Medical): No  Physical Activity: Inactive (05/01/2022)   Exercise Vital Sign    Days of Exercise per Week: 0 days    Minutes of Exercise per Session: 0 min  Stress: No Stress Concern Present (05/01/2022)   [place]    Feeling of Stress : Not at all  Social Connections: Moderately Isolated (05/01/2022)   Social Connection and Isolation Panel [NHANES]    Frequency of Communication with Friends and Family: Three times a week    Frequency of Social Gatherings with Friends and Family: Three times a week    Attends Religious Services: Never    Active Member of Clubs or Organizations: No    Attends Archivist Meetings: Never    Marital Status: Married  Human resources officer Violence: Not At Risk (05/01/2022)   Humiliation, Afraid, Rape, and Kick questionnaire    Fear of Current or Ex-Partner: No    Emotionally Abused: No    Physically Abused: No    Sexually Abused: No    Review of Systems: All negative except as stated above in HPI.  Physical Exam: Vital signs: Vitals:    05/17/22 0800 05/17/22 0806  BP:    Pulse: 77   Resp: (!) 21   Temp:  98.5 F (36.9 C)  SpO2: 95% 96%   Last BM Date : 05/13/22 (per patient and daugther) General:   Alert, in mild respiratory distress, oxygen by nasal cannula  lungs: Decreased respiratory effort, decreased air entry bilaterally, anterior exam only Heart:  Regular rate and rhythm; no murmurs, clicks, rubs,  or gallops. Abdomen: Soft, nontender, nondistended, bowel sounds present, no peritoneal signs Rectal:  Deferred  GI:  Lab Results: Recent Labs    05/15/22 1119 05/16/22 0326 05/17/22 0331  WBC 7.3 8.0 7.9  HGB 10.4* 10.9* 11.1*  HCT 30.9* 31.8* 32.7*  PLT 209 236 269   BMET Recent Labs    05/15/22 1119 05/16/22 0326 05/17/22 0331  NA 133* 137 139  K 4.3 4.3 4.4  CL 97* 99 101  CO2 '27 29 27  '$ GLUCOSE 270* 198* 155*  BUN 34* 30* 31*  CREATININE 1.61* 1.57* 1.54*  CALCIUM 9.3 9.5 9.0   LFT No results for input(s): "PROT", "ALBUMIN", "AST", "ALT", "ALKPHOS", "BILITOT", "BILIDIR", "IBILI" in the last 72 hours. PT/INR No results for input(s): "LABPROT", "INR" in the last 72 hours.   Studies/Results: ECHOCARDIOGRAM COMPLETE  Result Date: 05/16/2022    ECHOCARDIOGRAM REPORT   Patient Name:   MICHELL KADER Date of Exam: 05/16/2022 Medical Rec #:  [id number]       Height:       72.0 in Accession #:    [id number]      Weight:       142.9 lb Date of Birth:  07/06/1933       BSA:          1.847 m Patient Age:    40 years  BP:           117/42 mmHg Patient Gender: M               HR:           65 bpm. Exam Location:  Inpatient Procedure: 2D Echo, Cardiac Doppler and Color Doppler Indications:    Abnormal ECG  History:        Patient has prior history of Echocardiogram examinations, most                 recent 05/16/2021. CAD; Risk Factors:Diabetes.  Sonographer:    Jefferey Pica Referring Phys: [phone] BELKYS A REGALADO IMPRESSIONS  1. Left ventricular ejection fraction, by estimation, is 60 to 65%. The  left ventricle has normal function. The left ventricle has no regional wall motion abnormalities. Left ventricular diastolic parameters are consistent with Grade I diastolic dysfunction (impaired relaxation).  2. Right ventricular systolic function is normal. The right ventricular size is normal. There is normal pulmonary artery systolic pressure. The estimated right ventricular systolic pressure is 30.0 mmHg.  3. The mitral valve is degenerative. Trivial mitral valve regurgitation. No evidence of mitral stenosis.  4. The aortic valve is tricuspid. Aortic valve regurgitation is not visualized. Aortic valve sclerosis/calcification is present, without any evidence of aortic stenosis. Aortic valve Vmax measures 1.48 m/s.  5. Aortic dilatation noted. There is borderline dilatation of the ascending aorta, measuring 37 mm.  6. The inferior vena cava is normal in size with greater than 50% respiratory variability, suggesting right atrial pressure of 3 mmHg. FINDINGS  Left Ventricle: Left ventricular ejection fraction, by estimation, is 60 to 65%. The left ventricle has normal function. The left ventricle has no regional wall motion abnormalities. The left ventricular internal cavity size was normal in size. There is  no left ventricular hypertrophy. Left ventricular diastolic parameters are consistent with Grade I diastolic dysfunction (impaired relaxation). Normal left ventricular filling pressure. Right Ventricle: The right ventricular size is normal. No increase in right ventricular wall thickness. Right ventricular systolic function is normal. There is normal pulmonary artery systolic pressure. The tricuspid regurgitant velocity is 2.33 m/s, and  with an assumed right atrial pressure of 3 mmHg, the estimated right ventricular systolic pressure is 92.3 mmHg. Left Atrium: Left atrial size was normal in size. Right Atrium: Right atrial size was normal in size. Pericardium: There is no evidence of pericardial effusion.  Mitral Valve: The mitral valve is degenerative in appearance. There is mild calcification of the anterior mitral valve leaflet(s). Trivial mitral valve regurgitation. No evidence of mitral valve stenosis. Tricuspid Valve: The tricuspid valve is normal in structure. Tricuspid valve regurgitation is trivial. No evidence of tricuspid stenosis. Aortic Valve: The aortic valve is tricuspid. Aortic valve regurgitation is not visualized. Aortic valve sclerosis/calcification is present, without any evidence of aortic stenosis. Aortic valve peak gradient measures 8.7 mmHg. Pulmonic Valve: The pulmonic valve was normal in structure. Pulmonic valve regurgitation is not visualized. No evidence of pulmonic stenosis. Aorta: Aortic dilatation noted. There is borderline dilatation of the ascending aorta, measuring 37 mm. Venous: The inferior vena cava is normal in size with greater than 50% respiratory variability, suggesting right atrial pressure of 3 mmHg. IAS/Shunts: No atrial level shunt detected by color flow Doppler.  LEFT VENTRICLE PLAX 2D LVIDd:         4.10 cm   Diastology LVIDs:         1.30 cm   LV e' medial:    6.64  cm/s LV PW:         0.90 cm   LV E/e' medial:  9.6 LV IVS:        0.80 cm   LV e' lateral:   8.38 cm/s LVOT diam:     2.00 cm   LV E/e' lateral: 7.6 LV SV:         77 LV SV Index:   42 LVOT Area:     3.14 cm  RIGHT VENTRICLE             IVC RV S prime:     15.40 cm/s  IVC diam: 1.20 cm LEFT ATRIUM             Index        RIGHT ATRIUM           Index LA diam:        2.40 cm 1.30 cm/m   RA Area:     15.00 cm LA Vol (A2C):   32.2 ml 17.44 ml/m  RA Volume:   39.30 ml  21.28 ml/m LA Vol (A4C):   28.9 ml 15.65 ml/m LA Biplane Vol: 32.5 ml 17.60 ml/m  AORTIC VALVE                 PULMONIC VALVE AV Area (Vmax): 2.90 cm     PV Vmax:       0.89 m/s AV Vmax:        147.50 cm/s  PV Peak grad:  3.2 mmHg AV Peak Grad:   8.7 mmHg LVOT Vmax:      136.00 cm/s LVOT Vmean:     82.600 cm/s LVOT VTI:       0.246 m  AORTA  Ao Root diam: 2.80 cm Ao Asc diam:  3.70 cm MITRAL VALVE               TRICUSPID VALVE MV Area (PHT): 2.96 cm    TR Peak grad:   21.7 mmHg MV Decel Time: 256 msec    TR Vmax:        233.00 cm/s MV E velocity: 63.60 cm/s MV A velocity: 89.30 cm/s  SHUNTS MV E/A ratio:  0.71        Systemic VTI:  0.25 m                            Systemic Diam: 2.00 cm Fransico Him MD Electronically signed by Fransico Him MD Signature Date/Time: 05/16/2022/1:05:32 PM    Final    DG CHEST PORT 1 VIEW  Result Date: 05/16/2022 CLINICAL DATA:  Increasing shortness of breath EXAM: PORTABLE CHEST 1 VIEW COMPARISON:  05/12/2022 FINDINGS: Heart size and pulmonary vascularity are normal. Patchy infiltrates in both mid and lower lung zones, increasing since prior study, likely multifocal pneumonia. Edema less likely. Probable small pleural effusions. No pneumothorax. Degenerative changes in the spine. Postoperative change in the right shoulder. IMPRESSION: Increasing patchy infiltration in both mid and lower lung zones, likely multifocal pneumonia. Edema less likely. Small pleural effusions. Electronically Signed   By: Lucienne Capers M.D.   On: 05/16/2022 01:31    Impression/Plan: -Aspiration pneumonia.  Most likely from oropharyngeal dysphagia.  Patient with recent onset of coughing and choking 2 to 4 weeks ago.  Denies any sensation of food getting stuck in esophagus but complaining of bringing undigested food with coughing. -Aspiration pneumonia with respiratory failure currently on 6 L oxygen  Recommendations ---------------------------- -Detailed  discussion with patient and family at bedside.  Different options such as barium swallow as well as endoscopy discussed.  Family does not want any invasive procedure at this time.  Because of patient's ongoing weakness and difficulty breathing, they also do not want barium swallow to be performed today. -Option for NG tube for nutrition also discussed but they denied. -Discussed  with hospitalist.  Recommend to try modified barium swallow again on Monday when patient clinically improves from his respiratory distress and or proceeding with barium swallow on Monday depending on patient and family's decision. -No inpatient GI work-up planned at this time.  GI will sign off.  Call us back if needed  [name] Brace MD, [place] 05/17/2022, 11:22 AM  Contact #  [phone]     LOS: 3 days   [name] Brace  MD, FACP 05/17/2022, 10:26 AM  Contact #  [phone]

## 2022-05-18 DIAGNOSIS — Z515 Encounter for palliative care: Secondary | ICD-10-CM | POA: Diagnosis not present

## 2022-05-18 DIAGNOSIS — Z7189 Other specified counseling: Secondary | ICD-10-CM | POA: Diagnosis not present

## 2022-05-18 DIAGNOSIS — L988 Other specified disorders of the skin and subcutaneous tissue: Secondary | ICD-10-CM | POA: Diagnosis not present

## 2022-05-18 DIAGNOSIS — R531 Weakness: Secondary | ICD-10-CM | POA: Diagnosis not present

## 2022-05-18 DIAGNOSIS — J189 Pneumonia, unspecified organism: Secondary | ICD-10-CM | POA: Diagnosis not present

## 2022-05-18 LAB — CULTURE, BLOOD (ROUTINE X 2)
Culture: NO GROWTH
Culture: NO GROWTH
Special Requests: ADEQUATE

## 2022-05-18 MED ORDER — ACETAMINOPHEN 325 MG PO TABS: 650.0000 mg | ORAL_TABLET | Freq: Four times a day (QID) | ORAL | 0 refills | Status: AC | PRN

## 2022-05-18 MED ORDER — NAPHAZOLINE-GLYCERIN 0.012-0.2 % OP SOLN: 1.0000 [drp] | Freq: Four times a day (QID) | OPHTHALMIC | Status: DC | PRN

## 2022-05-18 MED ORDER — OXYCODONE HCL 5 MG PO TABS: 5.0000 mg | ORAL_TABLET | ORAL | 0 refills | Status: AC | PRN

## 2022-05-18 MED ORDER — ALPRAZOLAM 0.25 MG PO TABS: 0.2500 mg | ORAL_TABLET | Freq: Three times a day (TID) | ORAL | 0 refills | Status: AC | PRN

## 2022-05-18 MED ORDER — GUAIFENESIN-DM 100-10 MG/5ML PO SYRP: 5.0000 mL | ORAL_SOLUTION | ORAL | 0 refills | Status: AC | PRN

## 2022-05-18 MED ORDER — ALPRAZOLAM 0.25 MG PO TABS: 0.2500 mg | ORAL_TABLET | Freq: Three times a day (TID) | ORAL | Status: DC | PRN

## 2022-05-18 MED ORDER — IPRATROPIUM-ALBUTEROL 0.5-2.5 (3) MG/3ML IN SOLN: 3.0000 mL | Freq: Four times a day (QID) | RESPIRATORY_TRACT | 0 refills | Status: AC | PRN

## 2022-05-18 MED ORDER — ACETAMINOPHEN 500 MG PO TABS: 500.0000 mg | ORAL_TABLET | Freq: Four times a day (QID) | ORAL | 0 refills | Status: AC

## 2022-05-18 MED ORDER — TAMSULOSIN HCL 0.4 MG PO CAPS: 0.4000 mg | ORAL_CAPSULE | Freq: Every day | ORAL | 0 refills | Status: AC

## 2022-05-18 MED ORDER — FLUTICASONE PROPIONATE 50 MCG/ACT NA SUSP
1.0000 | Freq: Every day | NASAL | 2 refills | Status: AC
Start: 2022-05-18 — End: ?

## 2022-05-18 MED ORDER — POLYVINYL ALCOHOL 1.4 % OP SOLN: 1.0000 [drp] | OPHTHALMIC | 0 refills | Status: AC | PRN

## 2022-05-18 MED ORDER — GABAPENTIN 100 MG PO CAPS: 100.0000 mg | ORAL_CAPSULE | Freq: Three times a day (TID) | ORAL | 0 refills | Status: AC

## 2022-05-18 MED ORDER — NAPHAZOLINE-GLYCERIN 0.012-0.25 % OP SOLN
1.0000 [drp] | Freq: Four times a day (QID) | OPHTHALMIC | Status: DC | PRN
  Filled 2022-05-18: qty 15

## 2022-05-18 NOTE — Discharge Summary (Signed)
Physician Discharge Summary   Patient: George Barnett MRN: [id number] DOB: [birth year]  Admit date:     05/12/2022  Discharge date: 05/18/22  Discharge Physician: Elmarie Shiley   PCP: Haydee Salter, MD   Recommendations at discharge:   Comfort care.  Hospice care follow at facility.   Discharge Diagnoses: Principal Problem:   CAP (community acquired pneumonia) Active Problems:   Type 2 diabetes mellitus with stage 3 chronic kidney disease, without long-term current use of insulin (HCC)   Erosive pustular dermatosis of scalp   Tremor of both hands-  both wiht rest and activity; acutely worse   Stage 3a chronic kidney disease (CKD) (HCC)   Squamous cell cancer of skin of nose   Weakness   Lower extremity weakness   Fever   Pressure injury of skin   Palliative care by specialist   Goals of care, counseling/discussion   General weakness  Resolved Problems:   * No resolved hospital problems. [facility] Course: 88 year old with past medical history significant for diabetes, hypertension, CAD, chronic and recalcitrant erosive posterior dermatosis of the scalp on extensive treatment presented with increased weakness of both lower extremity and episode of fevers.  Neuro exam is nonfocal.  Chest x-ray showed left lower lobe atelectasis.  COVID and influenza negative.  Admitted with persistent fevers and symptoms.   Patient continued to spike fever despite being on Tylenol.  ID consulted for further evaluation of scalp dermatosis and concern for superimposed infection.   Patient develops Worsening respiratory failure overnight 6/29. He required up to 15 L oxygen. He received 2 doses of IV lasix which improved urine out put. Chest x ray is concerning with worsening multifocal PNA> Antibiotics change to Unasyn. Speech therapy evaluation. Oxygen requirement this morning has decreased to 6 L.     Patient desire for comfort care, he has been transition to hospice care. Plan to  discharge to facility with hospice.   Assessment and Plan: 1-Acute Hypoxic Respiratory failure. Aspiration PNA>  Suspect related to PNA, component of pulmonary edema after he received IV fluids. More likely aspiration PNA>  Continue to require 6 L oxygen.  Hold lasix. Required IV bolus last night for hypotension.  ECHO normal EF.  Treated with   Unasyn. Now  off antibiotics, transition to comfort care.  Schedule Nebulizer BID>  MBS yesterday concern for esophageal dysphagia. Retention fluid esophagus.  Patient does not wishes to proceed with further procedure. Transition to comfort care.  He will be discharge on oxycodone, xanax PR, for comfort.   2-Esophageal Dysphagia;  MBS concern of fluid in the esophagus.  Started IV Protonix BID>  Explain to patient benefit of esophagogram to rule out stricture, mass, obstruction of esophagus, presby esophagus. Patient report feeling of getting tired of procedure and interventions, he was initially willing to proceed with esophagogram, but after discussion with GI, is better to hold on esophagogram for now until his respiratory status is more stable.  Patient does not wishes to proceed with further procedure. He has transition to comfort care.    3-Multifocal PNA;  Also concern for superimposed cellulitis of the scalp ID consulted and following.  Azithromycin changed to doxycycline to cover for cellulitis of the scalp Blood cultures negative  Lactic acid at 2. . Received IV bolus 6/28..  Treated with Unasyn. off antibiotics, comfort care.  CT chest: widespread ground glass and peripheral consolidative opacities.    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 He follows at [facility] with dermatologist. Attending physician on 6/27 spoke with dermatologist from [facility]. Recommendation was to continue acitretin, as this is less likely to contribute to current  presentation. Plan to discontinue dapsone as it may cause weakness. She recommended continued topical treatment with tacrolimus and clobetasol. Patient does not wishes at this point to continue with topical treatment as it is very painful.  I offered oxycodone as needed for pain. ID consulted for evaluation of ? Superimpose cellulitis. Azitro change to Doxycycline.  Wound care consulted appreciate recommendations.   -Patient has transition to comfort care. Antibiotics has been discontinue.   4-Chronic iron deficiency anemia: Stable   Diabetes type 2: Continue with a sliding scale.  Hold metformin and Jardiance.   Bilateral lower extremity weakness: Suspect related to debility and generalized weakness. He was able to ambulate in the hall.  No significant focal weakness. MRI thoracic and lumbar spine reviewed with neurosurgeon on-call, no significant nerve  encroachment, compression.  Continue with PT>    Prostatism;  He had some urinary retention. Started on Flomax.     AKI on CKD stage IIIa Hold Cozaar. Prior cr per record at 1.4.  Strict I and . Bladder scan.  Renal function stable at 1.5 Comfort care now.   Hyponatremia; Received IV fluids.     Essential tremors, continue with primidone    Skin cancer: Has a skin graft procedure done, do not remove dressing. Wound care consulted Wound care as patient allows. Comfort care now.   Pressure injury right and left mid buttock  stage 1;present on admission; local care   local care.  Nutrition Problem: Increased nutrient needs Etiology: acute illness              Consultants: palliative, ID Procedures performed: None Disposition: Long term care facility Diet recommendation:  Discharge Diet Orders (From admission, onward)     Start     Ordered   05/18/22 0000  Diet - low sodium heart healthy        05/18/22 0911           Full liquid diet DISCHARGE MEDICATION: Allergies as of 05/18/2022       Reactions    Exenatide Nausea And Vomiting   Byetta    Nadolol Other (See Comments)   Numbness in fingers   Sitagliptin Phosphate [sitagliptin] Other (See Comments)   Weight loss        Medication List     STOP taking these medications    aspirin 81 MG tablet   atorvastatin 10 MG tablet Commonly known as: LIPITOR   cephALEXin 500 MG capsule Commonly known as: KEFLEX   dapsone 25 MG tablet   folic acid 1 MG tablet Commonly known as: FOLVITE   furosemide 20 MG tablet Commonly known as: LASIX   glimepiride 1 MG tablet Commonly known as: AMARYL   ipratropium 0.03 % nasal spray Commonly known as: ATROVENT   Jardiance 25 MG Tabs tablet Generic drug: empagliflozin   losartan 25 MG tablet Commonly known as: COZAAR   metFORMIN 500 MG tablet Commonly known as: GLUCOPHAGE   methotrexate 2.5 MG tablet Commonly known as: RHEUMATREX   predniSONE 10 MG tablet Commonly known as: DELTASONE   PROBIOTIC DAILY PO   SYSTANE OP       TAKE these medications    acetaminophen 500 MG tablet Commonly known as: TYLENOL Take 1 tablet (500 mg total) by mouth every 6 (six) hours.  acetaminophen 325 MG tablet Commonly known as: TYLENOL Take 2 tablets (650 mg total) by mouth every 6 (six) hours as needed for mild pain, fever or headache.   acitretin 10 MG capsule Commonly known as: SORIATANE Take 10 mg by mouth daily before breakfast.   ALPRAZolam 0.25 MG tablet Commonly known as: XANAX Take 1 tablet (0.25 mg total) by mouth 3 (three) times daily as needed for anxiety.   clobetasol ointment 0.05 % Commonly known as: TEMOVATE 1 Application 2 (two) times daily.   fluticasone 50 MCG/ACT nasal spray Commonly known as: FLONASE Place 1 spray into both nostrils daily.   gabapentin 100 MG capsule Commonly known as: NEURONTIN Take 1 capsule (100 mg total) by mouth 3 (three) times daily. What changed: when to take this   guaiFENesin-dextromethorphan 100-10 MG/5ML syrup Commonly  known as: ROBITUSSIN DM Take 5 mLs by mouth every 4 (four) hours as needed for cough (chest congestion).   ipratropium-albuterol 0.5-2.5 (3) MG/3ML Soln Commonly known as: DUONEB Take 3 mLs by nebulization every 6 (six) hours as needed.   oxyCODONE 5 MG immediate release tablet Commonly known as: Oxy IR/ROXICODONE Take 1 tablet (5 mg total) by mouth every 3 (three) hours as needed for moderate pain or breakthrough pain.   polyethylene glycol 17 g packet Commonly known as: MIRALAX / GLYCOLAX Take 17 g by mouth daily as needed for mild constipation.   polyvinyl alcohol 1.4 % ophthalmic solution Commonly known as: LIQUIFILM TEARS Place 1 drop into both eyes as needed for dry eyes.   primidone 50 MG tablet Commonly known as: MYSOLINE Take 1 tablet (50 mg total) by mouth daily. What changed: how much to take   tacrolimus 0.1 % ointment Commonly known as: PROTOPIC Apply 1 application topically 2 (two) times daily.   tamsulosin 0.4 MG Caps capsule Commonly known as: FLOMAX Take 1 capsule (0.4 mg total) by mouth daily.   terbinafine 1 % cream Commonly known as: LAMISIL Apply twice daily to lesions on the left leg until resolved.               Discharge Care Instructions  (From admission, onward)           Start     Ordered   05/18/22 0000  Discharge wound care:       Comments: See above   05/18/22 0911            Discharge Exam: Filed Weights   05/12/22 2203 05/13/22 0510 05/16/22 0213  Weight: 65.3 kg 63.2 kg 64.8 kg   General; Alert, chronic ill appearing.  Lung; BL crackles.   Condition at discharge: fair  The results of significant diagnostics from this hospitalization (including imaging, microbiology, ancillary and laboratory) are listed below for reference.   Imaging Studies: DG Swallowing Func-Speech Pathology  Result Date: 05/17/2022 Table formatting from the original result was not included. Objective Swallowing Evaluation: Type of Study:  MBS-Modified Barium Swallow Study  Patient Details Name: Rilee Wendling MRN: [id number] Date of Birth: [birth year] Today's Date: 05/16/2022 Time: SLP Start Time (ACUTE ONLY): 4268 -SLP Stop Time (ACUTE ONLY): 3419 SLP Time Calculation (min) (ACUTE ONLY): 26 min Past Medical History: Past Medical History: Diagnosis Date  Abdominal pain 03/25/2017  Anemia due to chemical agent (HCC)-dermatologic medications most likely 12/08/2019  Atypical chest pain 05/07/2019  Benign essential tremor 12/08/2019  Bilateral lower extremity edema 11/17/2017  Brittle diabetes mellitus ([place]) 01/12/2020  CAD (coronary artery disease) 03/25/2017  Cancer ([place])   SKIN CANCER  Cataract   Chronic head/ "scalp" pain 01/12/2020  Per patient the pain of his scalp is like "bees stinging you all over on your head "he says the slightest touch to the area causes significant pain/discomfort  Costochondritis 05/05/2017  --->pt had mentioned costochondritis at his last office visit with his former PCP prior to transferring care to [place]:  [name], MD - 02/24/2017 10:30 AM EDT Formatting of this note may be different from the original. Forrest is a 88 y.o. male who is here for a follow up visit.  DM: He is taking metformin and glimepiride and tolerates these well. His A1C was 7.1 on recent labs.  HTN: He  Decreased activity 12/08/2019  Dehydration- mild 12/08/2019  Diabetes mellitus without complication (HCC)   Edema, peripheral 11/22/2014  Elbow effusion, left   Erosive pustular dermatosis of scalp 0/99/8338  Folliculitis- scalp; txed by Derm- Dr Jarome Matin 07/27/2018  Frequency of urination   GAD (generalized anxiety disorder) 12/10/2019  GERD (gastroesophageal reflux disease)   Glossodynia 05/05/2017  [name], MD - 11/22/2016 2:15 PM EST Formatting of this note may be different from the original. Otolaryngology Initial Consultation Note  Requesting Attending Physician: [name], MD Service Requesting Consult: No service for patient  encounter.  History of Present Illness:   The patient is being seen in consultation with [name], MD for the evaluation of tongu  Heart attack [facility])   Heart disease   High risk medication use 01/15/2019  Hypertension   Hypertension associated with diabetes ([place]) 08/05/2017  Microalbuminuria due to type 2 diabetes mellitus ([place]) 08/05/2017  Mixed diabetic hyperlipidemia associated with type 2 diabetes mellitus ([place]) 08/05/2017  Orthostatic hypotension 11/05/2018  Primary osteoarthritis of right shoulder 01/24/2015  Prostate enlargement   Recurrent right inguinal hernia 05/18/2019  RLS (restless legs syndrome) 06/17/2017  Snoring 03/25/2017  Stage 3 chronic kidney disease ([place]) 04/22/2017  Stage 4 chronic kidney disease ([place]) 09/08/2020  Stress and adjustment reaction 01/12/2020  Tremor of both hands-  both wiht rest and activity; acutely worse 11/27/2018  Tremors of nervous system   Type 2 diabetes mellitus with stage 3 chronic kidney disease, without long-term current use of insulin ([place]) 05/05/2017  Urinary retention due to benign prostatic hyperplasia 01/25/2015  Venous insufficiency of both lower extremities 11/27/2018  White coat syndrome with diagnosis of hypertension 03/10/2020 Past Surgical History: Past Surgical History: Procedure Laterality Date  CARDIAC CATHETERIZATION    1999  cardio stint    HERNIA REPAIR    BILAT   INGUINAL HERNIA REPAIR  05/18/2019  INGUINAL HERNIA REPAIR Right 05/18/2019  Procedure: REPAIR RIGHT INGUINAL HERNIA WITH MESH;  Surgeon: Erroll Luna, MD;  Location: [facility];  Service: General;  Laterality: Right;  MOHS SURGERY    BILAT EARS   PROSTATE BIOPSY    TOTAL SHOULDER ARTHROPLASTY Right 01/24/2015  Procedure: RIGHT TOTAL SHOULDER ARTHROPLASTY;  Surgeon: Marchia Bond, MD;  Location: [facility];  Service: Orthopedics;  Laterality: Right; HPI: Per palliative note "88 year old gentleman admitted with suspected pneumonia, pustular dermatosis of scalp, has underlying stage III chronic kidney  disease, history of diabetes and skin cancer."  Pt with respiratory decline and required increased oxygen. Per notes, pt has h/o reporting soreness of the left side of his tongue, CT image negative.    CXR concerning as it shows "Increasing patchy infiltration in both mid and lower lung zones,  likely multifocal pneumonia. Edema less likely. Small pleural effusions." Today pt on 5 Liters and stable per  RN.  MBS was ordered by NP.  Subjective: pt awake in flouro chair  Recommendations for follow up therapy are one component of a multi-disciplinary discharge planning process, led by the attending physician.  Recommendations may be updated based on patient status, additional functional criteria and insurance authorization. Assessment / Plan / Recommendation   05/16/2022   2:22 PM Clinical Impressions Clinical Impression Patient presents with minimal oropharyngeal dysphagia but largely functional for his age.   No aspiration noted during MBS and pt was tested with sequential liquid boluses.  Lingual rocking observed with oral transiting of liquids.  Mild pharyngeal retention without pt awareness and intermittent trace laryngeal penetration observed due to decreased motility - epiglottic deflection, laryngeal closure.   Chin tuck posture tested to determine if helpful and he did not demonstrate penetration or aspiration with this posture.  Upon esophageal sweep, pt appeared with retention without awareness - causing SLP to suspect this may be pt's primary aspiration risk - especially given her reports issues with eructation and fullness "pointing to esophagus" x six months.   Suspect pt's DISH may cause obstructive pulmonary restriction, impairing ability for pt to cough and expectorate when/if he aspirates.  Pt advises that clears liquids are tolerated better than solids - and thus recommend full liquid floor stock tonight and esophagram 05/17/2022 to rule out correctable source of dysphagia.  RN informed. SLP Visit  Diagnosis Dysphagia, oropharyngeal phase (R13.12)     05/16/2022   2:22 PM Treatment Recommendations Treatment Recommendations Other (Comment)     05/16/2022   2:33 PM Prognosis Prognosis for Safe Diet Advancement Fair Barriers to Reach Goals Severity of deficits;Time post onset   05/16/2022   2:22 PM Diet Recommendations SLP Diet Recommendations Other (Comment) Liquid Administration via Cup;Straw Medication Administration Other (Comment) Compensations Slow rate;Small sips/bites;Other (Comment) Postural Changes Remain semi-upright after after feeds/meals (Comment);Seated upright at 90 degrees     05/16/2022   2:22 PM Other Recommendations Oral Care Recommendations Oral care QID Follow Up Recommendations Follow physician's recommendations for discharge plan and follow up therapies Assistance recommended at discharge Frequent or constant Supervision/Assistance Functional Status Assessment Patient has had a recent decline in their functional status and/or demonstrates limited ability to make significant improvements in function in a reasonable and predictable amount of time   05/16/2022   2:22 PM Frequency and Duration  Speech Therapy Frequency (ACUTE ONLY) min 1 x/week Treatment Duration 1 week     05/16/2022   2:06 PM Oral Phase Oral Phase Impaired Oral - Nectar Cup NT Oral - Nectar Straw WFL Oral - Thin Teaspoon WFL Oral - Thin Cup NT;Other (Comment) Oral - Thin Straw WFL Oral - Puree WFL Oral - Mech Soft [facility]    05/16/2022   2:12 PM Pharyngeal Phase Pharyngeal Phase Impaired Pharyngeal- Nectar Straw WFL Pharyngeal Material does not enter airway Pharyngeal- Thin Teaspoon Reduced epiglottic inversion;Pharyngeal residue - valleculae;Pharyngeal residue - pyriform;Reduced laryngeal elevation;Reduced airway/laryngeal closure Pharyngeal Material does not enter airway Pharyngeal- Thin Straw Reduced epiglottic inversion;Reduced airway/laryngeal closure;Reduced tongue base retraction;Penetration/Aspiration during swallow;Reduced  laryngeal elevation Pharyngeal Material enters airway, remains ABOVE vocal cords and not ejected out Pharyngeal- Puree WFL;Pharyngeal residue - valleculae Pharyngeal Material does not enter airway Pharyngeal- Mechanical Soft Pharyngeal residue - valleculae;WFL Pharyngeal Material does not enter airway Pharyngeal Comment Chin tuck tested to determine if prevents penetration *penetration was inconsistent*; Pt challenged with sequential boluses of thin and did not aspirate. Cued effortful swallow completed and resulted in prolonged airway closure.  Of note pt  did not cough before, during or after MBS.    05/16/2022   2:17 PM Cervical Esophageal Phase  Cervical Esophageal Phase Impaired Cervical Esophageal Comment Upon esophageal sweep, pt appeared with retention without awareness; Radiologist was not present to confirm.   Pt endorses sensing delayed clearance through his esophagus and frequent erucation. He reports this has been ongoing for approximately six months with a sudden onset.  Denies coughing associated with po intake. [name] 05/16/2022, 2:36 PM    Kathleen Lime, MS [facility] SLP Acute Rehab Services Office [phone] Pager [phone]                  CT CHEST WO CONTRAST  Result Date: 05/17/2022 CLINICAL DATA:  Fever of unknown origin. Community-acquired pneumonia. EXAM: CT CHEST WITHOUT CONTRAST TECHNIQUE: Multidetector CT imaging of the chest was performed following the standard protocol without IV contrast. RADIATION DOSE REDUCTION: This exam was performed according to the departmental dose-optimization program which includes automated exposure control, adjustment of the mA and/or kV according to patient size and/or use of iterative reconstruction technique. COMPARISON:  Chest radiograph 05/16/2022.  Chest CTA 03/07/2017. FINDINGS: Cardiovascular: Normal caliber of the thoracic aorta. Left main, LAD, left circumflex, and right coronary artery atherosclerotic calcification. Normal heart size. No  pericardial effusion. Mediastinum/Nodes: No enlarged axillary, mediastinal, or definite hilar lymph nodes are identified within limitations of noncontrast technique. Moderate gaseous distension of the proximal esophagus. Small amount of fluid in the mid esophagus. Unremarkable thyroid. Lungs/Pleura: Small bilateral pleural effusions. No pneumothorax. Hazy ground-glass opacities throughout both lungs as well as widespread patchy peripheral consolidation bilaterally. Patent central airways. Upper Abdomen: Partial coverage of a moderately distended gallbladder. 4 cm left renal cyst. Musculoskeletal: Right shoulder arthroplasty. Thoracic spondylosis. No suspicious osseous lesion. IMPRESSION: 1. Widespread ground-glass and peripheral consolidative opacities in both lungs consistent with pneumonia. 2. Small bilateral pleural effusions. Electronically Signed   By: Logan Bores M.D.   On: 05/17/2022 11:40   ECHOCARDIOGRAM COMPLETE  Result Date: 05/16/2022    ECHOCARDIOGRAM REPORT   Patient Name:   CRUZ BONG Date of Exam: 05/16/2022 Medical Rec #:  [id number]       Height:       72.0 in Accession #:    [id number]      Weight:       142.9 lb Date of Birth:  15-May-1933       BSA:          1.847 m Patient Age:    31 years        BP:           117/42 mmHg Patient Gender: M               HR:           65 bpm. Exam Location:  Inpatient Procedure: 2D Echo, Cardiac Doppler and Color Doppler Indications:    Abnormal ECG  History:        Patient has prior history of Echocardiogram examinations, most                 recent 05/16/2021. CAD; Risk Factors:Diabetes.  Sonographer:    Jefferey Pica Referring Phys: [phone] Veleda Mun A Donyae Kilner IMPRESSIONS  1. Left ventricular ejection fraction, by estimation, is 60 to 65%. The left ventricle has normal function. The left ventricle has no regional wall motion abnormalities. Left ventricular diastolic parameters are consistent with Grade I diastolic dysfunction (impaired relaxation).  2.  Right ventricular systolic function is normal.  The right ventricular size is normal. There is normal pulmonary artery systolic pressure. The estimated right ventricular systolic pressure is 51.0 mmHg.  3. The mitral valve is degenerative. Trivial mitral valve regurgitation. No evidence of mitral stenosis.  4. The aortic valve is tricuspid. Aortic valve regurgitation is not visualized. Aortic valve sclerosis/calcification is present, without any evidence of aortic stenosis. Aortic valve Vmax measures 1.48 m/s.  5. Aortic dilatation noted. There is borderline dilatation of the ascending aorta, measuring 37 mm.  6. The inferior vena cava is normal in size with greater than 50% respiratory variability, suggesting right atrial pressure of 3 mmHg. FINDINGS  Left Ventricle: Left ventricular ejection fraction, by estimation, is 60 to 65%. The left ventricle has normal function. The left ventricle has no regional wall motion abnormalities. The left ventricular internal cavity size was normal in size. There is  no left ventricular hypertrophy. Left ventricular diastolic parameters are consistent with Grade I diastolic dysfunction (impaired relaxation). Normal left ventricular filling pressure. Right Ventricle: The right ventricular size is normal. No increase in right ventricular wall thickness. Right ventricular systolic function is normal. There is normal pulmonary artery systolic pressure. The tricuspid regurgitant velocity is 2.33 m/s, and  with an assumed right atrial pressure of 3 mmHg, the estimated right ventricular systolic pressure is 25.8 mmHg. Left Atrium: Left atrial size was normal in size. Right Atrium: Right atrial size was normal in size. Pericardium: There is no evidence of pericardial effusion. Mitral Valve: The mitral valve is degenerative in appearance. There is mild calcification of the anterior mitral valve leaflet(s). Trivial mitral valve regurgitation. No evidence of mitral valve stenosis. Tricuspid  Valve: The tricuspid valve is normal in structure. Tricuspid valve regurgitation is trivial. No evidence of tricuspid stenosis. Aortic Valve: The aortic valve is tricuspid. Aortic valve regurgitation is not visualized. Aortic valve sclerosis/calcification is present, without any evidence of aortic stenosis. Aortic valve peak gradient measures 8.7 mmHg. Pulmonic Valve: The pulmonic valve was normal in structure. Pulmonic valve regurgitation is not visualized. No evidence of pulmonic stenosis. Aorta: Aortic dilatation noted. There is borderline dilatation of the ascending aorta, measuring 37 mm. Venous: The inferior vena cava is normal in size with greater than 50% respiratory variability, suggesting right atrial pressure of 3 mmHg. IAS/Shunts: No atrial level shunt detected by color flow Doppler.  LEFT VENTRICLE PLAX 2D LVIDd:         4.10 cm   Diastology LVIDs:         1.30 cm   LV e' medial:    6.64 cm/s LV PW:         0.90 cm   LV E/e' medial:  9.6 LV IVS:        0.80 cm   LV e' lateral:   8.38 cm/s LVOT diam:     2.00 cm   LV E/e' lateral: 7.6 LV SV:         77 LV SV Index:   42 LVOT Area:     3.14 cm  RIGHT VENTRICLE             IVC RV S prime:     15.40 cm/s  IVC diam: 1.20 cm LEFT ATRIUM             Index        RIGHT ATRIUM           Index LA diam:        2.40 cm 1.30 cm/m   RA Area:  15.00 cm LA Vol (A2C):   32.2 ml 17.44 ml/m  RA Volume:   39.30 ml  21.28 ml/m LA Vol (A4C):   28.9 ml 15.65 ml/m LA Biplane Vol: 32.5 ml 17.60 ml/m  AORTIC VALVE                 PULMONIC VALVE AV Area (Vmax): 2.90 cm     PV Vmax:       0.89 m/s AV Vmax:        147.50 cm/s  PV Peak grad:  3.2 mmHg AV Peak Grad:   8.7 mmHg LVOT Vmax:      136.00 cm/s LVOT Vmean:     82.600 cm/s LVOT VTI:       0.246 m  AORTA Ao Root diam: 2.80 cm Ao Asc diam:  3.70 cm MITRAL VALVE               TRICUSPID VALVE MV Area (PHT): 2.96 cm    TR Peak grad:   21.7 mmHg MV Decel Time: 256 msec    TR Vmax:        233.00 cm/s MV E velocity:  63.60 cm/s MV A velocity: 89.30 cm/s  SHUNTS MV E/A ratio:  0.71        Systemic VTI:  0.25 m                            Systemic Diam: 2.00 cm Fransico Him MD Electronically signed by Fransico Him MD Signature Date/Time: 05/16/2022/1:05:32 PM    Final    DG CHEST PORT 1 VIEW  Result Date: 05/16/2022 CLINICAL DATA:  Increasing shortness of breath EXAM: PORTABLE CHEST 1 VIEW COMPARISON:  05/12/2022 FINDINGS: Heart size and pulmonary vascularity are normal. Patchy infiltrates in both mid and lower lung zones, increasing since prior study, likely multifocal pneumonia. Edema less likely. Probable small pleural effusions. No pneumothorax. Degenerative changes in the spine. Postoperative change in the right shoulder. IMPRESSION: Increasing patchy infiltration in both mid and lower lung zones, likely multifocal pneumonia. Edema less likely. Small pleural effusions. Electronically Signed   By: Lucienne Capers M.D.   On: 05/16/2022 01:31   MR LUMBAR SPINE WO CONTRAST  Result Date: 05/13/2022 CLINICAL DATA:  Low back pain, cauda equina syndrome suspected; Ataxia, nontraumatic, thoracic pathology suspected EXAM: MRI THORACIC AND LUMBAR SPINE WITHOUT CONTRAST TECHNIQUE: Multiplanar and multiecho pulse sequences of the thoracic and lumbar spine were obtained without intravenous contrast. COMPARISON:  CT 04/24/2017 FINDINGS: MRI THORACIC SPINE FINDINGS Alignment:  Somewhat exaggerated kyphosis.  No listhesis. Vertebrae:  No fracture, evidence of discitis, or bone lesion. Cord: No abnormal cord signal. Paraspinal and other soft tissues: Small bilateral pleural effusions. Disc levels: No large disc herniation. There is ligamentum flavum hypertrophy at T10-T11 and T11-T12 which results in mild spinal canal narrowing at T10-T11 and minimal left posterior canal narrowing at T11-T12. There is no significant neural foraminal stenosis at any level. MRI LUMBAR SPINE FINDINGS Segmentation:  Standard. Alignment:  Trace degenerative  retrolisthesis at L5-S1. Vertebrae: No fracture, evidence of discitis, or aggressive bone lesion. Conus medullaris and cauda equina: Conus extends to the L1 level. Conus and cauda equina appear normal. Paraspinal and other soft tissues: Partially visualized left renal cyst which appears simple on prior CT in 2018. Lower paraspinal muscle atrophy. Disc levels: T12-L1: No significant stenosis. L1-L2: Mild disc bulging and bilateral facet arthropathy. No significant stenosis. L2-L3: Minimal foraminal disc bulging and bilateral facet  arthropathy. No significant stenosis. L3-L4: Mild foraminal disc bulging, ligamentum hypertrophy and moderate bilateral facet arthropathy. There is moderate bilateral subarticular stenosis encroaching the descending L4 nerve roots (series 10, image 25). No significant neural foraminal stenosis. L4-L5: Mild disc bulging, ligamentum flavum hypertrophy and mild facet arthropathy. There is moderate, right greater than left subarticular stenosis, encroaching the bilateral descending L5 nerve roots (series 10, image 32). No significant neural foraminal stenosis. L5-S1: Severe disc height loss with partial bony fusion and posterior endplate spurring. Moderate bilateral facet arthropathy. There is mild to moderate right and moderate left neural foraminal stenosis. No spinal canal stenosis. IMPRESSION: MR THORACIC SPINE IMPRESSION Mild spinal canal narrowing at T10-T11 due to prominent ligamentum flavum thickening. No large disc herniation or significant neural foraminal stenosis at any level. Small bilateral pleural effusions. MR LUMBAR SPINE IMPRESSION Multilevel degenerative changes, most prominent at L3-L4, L4-5, and L5-S1: L3-L4: Moderate bilateral subarticular stenosis encroaching the descending L4 nerve roots. L4-L5: Moderate, right greater than left subarticular stenosis encroaching the descending L5 nerve roots. L5-S1: Mild to moderate right and moderate left neural foraminal stenosis. No  spinal canal stenosis. Electronically Signed   By: Maurine Simmering M.D.   On: 05/13/2022 08:46   MR THORACIC SPINE WO CONTRAST  Result Date: 05/13/2022 CLINICAL DATA:  Low back pain, cauda equina syndrome suspected; Ataxia, nontraumatic, thoracic pathology suspected EXAM: MRI THORACIC AND LUMBAR SPINE WITHOUT CONTRAST TECHNIQUE: Multiplanar and multiecho pulse sequences of the thoracic and lumbar spine were obtained without intravenous contrast. COMPARISON:  CT 04/24/2017 FINDINGS: MRI THORACIC SPINE FINDINGS Alignment:  Somewhat exaggerated kyphosis.  No listhesis. Vertebrae:  No fracture, evidence of discitis, or bone lesion. Cord: No abnormal cord signal. Paraspinal and other soft tissues: Small bilateral pleural effusions. Disc levels: No large disc herniation. There is ligamentum flavum hypertrophy at T10-T11 and T11-T12 which results in mild spinal canal narrowing at T10-T11 and minimal left posterior canal narrowing at T11-T12. There is no significant neural foraminal stenosis at any level. MRI LUMBAR SPINE FINDINGS Segmentation:  Standard. Alignment:  Trace degenerative retrolisthesis at L5-S1. Vertebrae: No fracture, evidence of discitis, or aggressive bone lesion. Conus medullaris and cauda equina: Conus extends to the L1 level. Conus and cauda equina appear normal. Paraspinal and other soft tissues: Partially visualized left renal cyst which appears simple on prior CT in 2018. Lower paraspinal muscle atrophy. Disc levels: T12-L1: No significant stenosis. L1-L2: Mild disc bulging and bilateral facet arthropathy. No significant stenosis. L2-L3: Minimal foraminal disc bulging and bilateral facet arthropathy. No significant stenosis. L3-L4: Mild foraminal disc bulging, ligamentum hypertrophy and moderate bilateral facet arthropathy. There is moderate bilateral subarticular stenosis encroaching the descending L4 nerve roots (series 10, image 25). No significant neural foraminal stenosis. L4-L5: Mild disc  bulging, ligamentum flavum hypertrophy and mild facet arthropathy. There is moderate, right greater than left subarticular stenosis, encroaching the bilateral descending L5 nerve roots (series 10, image 32). No significant neural foraminal stenosis. L5-S1: Severe disc height loss with partial bony fusion and posterior endplate spurring. Moderate bilateral facet arthropathy. There is mild to moderate right and moderate left neural foraminal stenosis. No spinal canal stenosis. IMPRESSION: MR THORACIC SPINE IMPRESSION Mild spinal canal narrowing at T10-T11 due to prominent ligamentum flavum thickening. No large disc herniation or significant neural foraminal stenosis at any level. Small bilateral pleural effusions. MR LUMBAR SPINE IMPRESSION Multilevel degenerative changes, most prominent at L3-L4, L4-5, and L5-S1: L3-L4: Moderate bilateral subarticular stenosis encroaching the descending L4 nerve roots. L4-L5: Moderate, right greater than  left subarticular stenosis encroaching the descending L5 nerve roots. L5-S1: Mild to moderate right and moderate left neural foraminal stenosis. No spinal canal stenosis. Electronically Signed   By: Maurine Simmering M.D.   On: 05/13/2022 08:46   DG Chest 2 View  Result Date: 05/12/2022 CLINICAL DATA:  Weakness. EXAM: CHEST - 2 VIEW COMPARISON:  Chest x-ray 03/03/2017 FINDINGS: The heart size and mediastinal contours are within normal limits. There are minimal patchy opacities in the left costophrenic angle. The lungs are otherwise clear. There is no pleural effusion or pneumothorax. Right shoulder arthroplasty is again seen. No acute fractures are identified. IMPRESSION: Minimal patchy airspace disease in the left lower lung worrisome for infection. Recommend follow-up PA and lateral chest x-ray in 4-6 weeks to confirm resolution. Electronically Signed   By: Ronney Asters M.D.   On: 05/12/2022 23:03    Microbiology: Results for orders placed or performed during the hospital  encounter of 05/12/22  Urine Culture     Status: None   Collection Time: 05/12/22 11:26 PM   Specimen: In/Out Cath Urine  Result Value Ref Range Status   Specimen Description   Final    IN/OUT CATH URINE Performed at [facility], [place] [address] [place], [place] [address]    Special Requests   Final    NONE Performed at [facility], [place] [address], [place], [place] [address]    Culture   Final    NO GROWTH Performed at [facility], [place] [address] [place], [place] [address]    Report Status 05/14/2022 FINAL  Final  Resp Panel by RT-PCR (Flu A&B, Covid) Anterior Nasal Swab     Status: None   Collection Time: 05/12/22 11:26 PM   Specimen: Anterior Nasal Swab  Result Value Ref Range Status   SARS Coronavirus 2 by RT PCR NEGATIVE NEGATIVE Final    Comment: (NOTE) SARS-CoV-2 target nucleic acids are NOT DETECTED.  The SARS-CoV-2 RNA is generally detectable in upper respiratory specimens during the acute phase of infection. The lowest concentration of SARS-CoV-2 viral copies this assay can detect is 138 copies/mL. A negative result does not preclude SARS-Cov-2 infection and should not be used as the sole basis for treatment or other patient management decisions. A negative result may occur with  improper specimen collection/handling, submission of specimen other than nasopharyngeal swab, presence of viral mutation(s) within the areas targeted by this assay, and inadequate number of viral copies(<138 copies/mL). A negative result must be combined with clinical observations, patient history, and epidemiological information. The expected result is Negative.  Fact Sheet for Patients:  EntrepreneurPulse.com.au  Fact Sheet for Healthcare Providers:  IncredibleEmployment.be  This test is no t yet approved or cleared by the [place] FDA and  has been authorized for detection and/or diagnosis of  SARS-CoV-2 by FDA under an Emergency Use Authorization (EUA). This EUA will remain  in effect (meaning this test can be used) for the duration of the COVID-19 declaration under Section 564(b)(1) of the Act, 21 U.S.C.section 360bbb-3(b)(1), unless the authorization is terminated  or revoked sooner.       Influenza A by PCR NEGATIVE NEGATIVE Final   Influenza B by PCR NEGATIVE NEGATIVE Final    Comment: (NOTE) The Xpert Xpress SARS-CoV-2/FLU/RSV plus assay is intended as an aid in the diagnosis of influenza from Nasopharyngeal swab specimens and should not be used as a sole basis for treatment. Nasal washings and aspirates are unacceptable for Xpert Xpress SARS-CoV-2/FLU/RSV  testing.  Fact Sheet for Patients: EntrepreneurPulse.com.au  Fact Sheet for Healthcare Providers: IncredibleEmployment.be  This test is not yet approved or cleared by the [place] FDA and has been authorized for detection and/or diagnosis of SARS-CoV-2 by FDA under an Emergency Use Authorization (EUA). This EUA will remain in effect (meaning this test can be used) for the duration of the COVID-19 declaration under Section 564(b)(1) of the Act, 21 U.S.C. section 360bbb-3(b)(1), unless the authorization is terminated or revoked.  Performed at [facility], [place] [address] [place] [address], [place], [place] [address]   Blood culture (routine x 2)     Status: None (Preliminary result)   Collection Time: 05/13/22  1:00 AM   Specimen: BLOOD  Result Value Ref Range Status   Specimen Description   Final    BLOOD RIGHT ANTECUBITAL Performed at [place] [address], [place], [place] [address]    Special Requests   Final    BOTTLES DRAWN AEROBIC AND ANAEROBIC Blood Culture results may not be optimal due to an excessive volume of blood received in culture bottles Performed at [place] [address] [place], [place]  [address]    Culture   Final    NO GROWTH 4 DAYS Performed at [facility], [place] [address], [place], [place] [address]    Report Status PENDING  Incomplete  Blood culture (routine x 2)     Status: None (Preliminary result)   Collection Time: 05/13/22  5:52 AM   Specimen: BLOOD  Result Value Ref Range Status   Specimen Description   Final    BLOOD RIGHT ANTECUBITAL Performed at [place] [address] [place], [place] [address]    Special Requests   Final    BOTTLES DRAWN AEROBIC AND ANAEROBIC Blood Culture adequate volume Performed at [place] [address] [place], [place] [address]    Culture   Final    NO GROWTH 4 DAYS Performed at [facility], [place] [address] [place],  [address]    Report Status PENDING  Incomplete  Respiratory (~20 pathogens) panel by PCR     Status: None   Collection Time: 05/15/22  2:01 PM   Specimen: Nasopharyngeal Swab; Respiratory  Result Value Ref Range Status   Adenovirus NOT DETECTED NOT DETECTED Final   Coronavirus 229E NOT DETECTED NOT DETECTED Final    Comment: (NOTE) The Coronavirus on the Respiratory Panel, DOES NOT test for the novel  Coronavirus (2019 nCoV)    Coronavirus HKU1 NOT DETECTED NOT DETECTED Final   Coronavirus NL63 NOT DETECTED NOT DETECTED Final   Coronavirus OC43 NOT DETECTED NOT DETECTED Final   Metapneumovirus NOT DETECTED NOT DETECTED Final   Rhinovirus / Enterovirus NOT DETECTED NOT DETECTED Final   Influenza A NOT DETECTED NOT DETECTED Final   Influenza B NOT DETECTED NOT DETECTED Final   Parainfluenza Virus 1 NOT DETECTED NOT DETECTED Final   Parainfluenza Virus 2 NOT DETECTED NOT DETECTED Final   Parainfluenza Virus 3 NOT DETECTED NOT DETECTED Final   Parainfluenza Virus 4 NOT DETECTED NOT DETECTED Final   Respiratory Syncytial Virus NOT DETECTED NOT DETECTED Final   Bordetella pertussis NOT DETECTED NOT DETECTED Final   Bordetella Parapertussis  NOT DETECTED NOT DETECTED Final   Chlamydophila pneumoniae NOT DETECTED NOT DETECTED Final   Mycoplasma pneumoniae NOT DETECTED NOT DETECTED Final    Comment: Performed at [facility], [place]. [address]  [place], [place] [address]  MRSA Next Gen by PCR, Nasal     Status: None   Collection Time: 05/16/22 10:52 AM   Specimen: Urine, Clean Catch; Nasal Swab  Result Value Ref Range Status   MRSA by PCR Next Gen NOT DETECTED NOT DETECTED Final    Comment: (NOTE) The GeneXpert MRSA Assay (FDA approved for NASAL specimens only), is one component of a comprehensive MRSA colonization surveillance program. It is not intended to diagnose MRSA infection nor to guide or monitor treatment for MRSA infections. Test performance is not FDA approved in patients less than 43 years old. Performed at [facility], [place] [address], [place], [place] [address]     Labs: CBC: Recent Labs  Lab 05/12/22 2326 05/13/22 0552 05/13/22 0619 05/15/22 1119 05/16/22 0326 05/17/22 1572  WBC 7.4 DUPLICATE REQUEST 7.2 7.3 8.0 7.9  NEUTROABS 5.4 DUPLICATE REQUEST 4.7  --   --   --   HGB 62.0* DUPLICATE REQUEST 35.5* 10.4* 10.9* 97.4*  HCT 16.3* DUPLICATE REQUEST 84.5* 30.9* 31.8* 32.7*  MCV 364.6* DUPLICATE REQUEST 803.2* 103.0* 101.3* 122.4*  PLT 825 DUPLICATE REQUEST 003 704 236 888   Basic Metabolic Panel: Recent Labs  Lab 05/12/22 2326 05/13/22 0552 05/15/22 1119 05/16/22 0326 05/17/22 0331  NA 138 138 133* 137 139  K 4.4 4.2 4.3 4.3 4.4  CL 102 103 97* 99 101  CO2 '27 27 27 29 27  '$ GLUCOSE 159* 120* 270* 198* 155*  BUN 41* 37* 34* 30* 31*  CREATININE 1.64* 1.62* 1.61* 1.57* 1.54*  CALCIUM 9.5 8.9 9.3 9.5 9.0  MG  --   --   --  1.7  --    Liver Function Tests: Recent Labs  Lab 05/12/22 2326 05/13/22 0552  AST 16 16  ALT 14 12  ALKPHOS 39 35*  BILITOT 0.7 0.6  PROT 6.0* 5.5*  ALBUMIN 3.1* 2.8*   CBG: Recent Labs  Lab 05/16/22 1140 05/16/22 1640 05/16/22 2143  05/17/22 0750 05/17/22 1208  GLUCAP 193* 150* 140* 165* 227*    Discharge time spent: greater than 30 minutes.  Signed: Elmarie Shiley, MD [facility] 05/18/2022

## 2022-05-18 NOTE — TOC Progression Note (Signed)
Transition of Care [facility]) - Progression Note    Patient Details  Name: George Barnett MRN: [id number] Date of Birth: 1933/03/08  Transition of Care [facility]) CM/SW Contact  Purcell Mouton, RN Phone Number: 05/18/2022, 10:20 AM  Clinical Narrative:     Spoke with AC at [facility]. SNF is unable to take pt back today related not having Oxygen that pt needs. Will take pt back on Monday.  Expected Discharge Plan: [place] Barriers to Discharge: Continued Medical Work up  Expected Discharge Plan and Services Expected Discharge Plan: [place]   Discharge Planning Services: CM Consult Post Acute Care Choice: [place] Living arrangements for the past 2 months: [place] Expected Discharge Date: 05/18/22                                     Social Determinants of Health (SDOH) Interventions    Readmission Risk Interventions     No data to display

## 2022-05-18 NOTE — Progress Notes (Signed)
Manufacturing engineer [facility]) Hospital Liaison Note   Received request from Transitions of Care Manager, Lindajo Royal., for hospice services at home after discharge. Chart and patient information under review by [facility] physician. Hospice eligibility approved.   Spoke with wife & daughter to initiate education related to hospice philosophy, services, and team approach to care. Both verbalized understanding of information given. Per discussion, the plan is for patient to discharge to [place] via EMS once cleared to [place].    Please send signed and completed DNR home with patient/family. Please provide prescriptions at discharge as needed to ensure ongoing symptom management.    [facility] information and contact numbers given to family & above information shared with TOC.   Please call with any questions/concerns.    Thank you for the opportunity to participate in this patient's care.   Daphene Calamity, MSW [facility] Liaison  [phone]

## 2022-05-18 NOTE — Progress Notes (Signed)
Pt and daughter refused bladder scan. Primo Fit in place and urine noted in cannister.

## 2022-05-18 NOTE — Progress Notes (Signed)
Daily Progress Note   Patient Name: George Barnett       Date: 05/18/2022 DOB: 09-Oct-1933  Age: 88 y.o. MRN#: [id number] Attending Physician: Elmarie Shiley, MD Primary Care Physician: Haydee Salter, MD Admit Date: 05/12/2022  Reason for Consultation/Follow-up: Establishing goals of care  Subjective:  Resting in bed, awakens easily, family at bedside, son daughter and wife.  Had fevers overnight.  Awaiting discharge.  .    Length of Stay: 4  Current Medications: Scheduled Meds:   acetaminophen  500 mg Oral Q6H   acitretin  10 mg Oral Q supper   clobetasol ointment  1 Application Topical BID   feeding supplement  237 mL Oral BID BM   fluticasone  1 spray Each Nare Daily   gabapentin  100 mg Oral TID   ipratropium-albuterol  3 mL Nebulization BID   mouth rinse  15 mL Mouth Rinse 4 times per day   pantoprazole (PROTONIX) IV  40 mg Intravenous Q12H   polyethylene glycol  17 g Oral Daily   polyvinyl alcohol  1 drop Both Eyes Daily   primidone  25 mg Oral Daily   tacrolimus  1 Application Topical BID   tamsulosin  0.4 mg Oral Daily   terbinafine   Topical Daily    Continuous Infusions:    PRN Meds: acetaminophen **OR** acetaminophen, ALPRAZolam, diphenhydrAMINE-zinc acetate, guaiFENesin-dextromethorphan, ipratropium-albuterol, lip balm, morphine injection, naphazoline-glycerin, mouth rinse, oxyCODONE, polyvinyl alcohol  Physical Exam         Appears weak Resting in bed Regular work of breathing Appears with fatigue/generalized weakness Has dressing on his nose Scalp noted  Vital Signs: BP (!) 145/66 (BP Location: Left Arm)   Pulse 74   Temp (!) 102 F (38.9 C) (Oral) Comment: PRN tylenol given  Resp (!) 23   Ht 6' (1.829 m)   Wt 64.8 kg   SpO2 96%   BMI 19.38  kg/m  SpO2: SpO2: 96 % O2 Device: O2 Device: Nasal Cannula O2 Flow Rate: O2 Flow Rate (L/min): 5 L/min  Intake/output summary:  Intake/Output Summary (Last 24 hours) at 05/18/2022 1056 Last data filed at 05/18/2022 0000 Gross per 24 hour  Intake 230 ml  Output 800 ml  Net -570 ml    LBM: Last BM Date :  (PTA) Baseline Weight: Weight: 65.3 kg  Most recent weight: Weight: 64.8 kg       Palliative Assessment/Data:      Patient Active Problem List   Diagnosis Date Noted   Palliative care by specialist    Goals of care, counseling/discussion    General weakness    Pressure injury of skin 05/16/2022   Fever 05/14/2022   CAP (community acquired pneumonia) 05/13/2022   Weakness 05/13/2022   Lower extremity weakness 05/13/2022   Squamous cell cancer of skin of nose 04/29/2022   Hyperlipidemia 01/17/2022   Basal cell carcinoma 10/29/2021   Squamous cell carcinoma of antihelix, right 11/02/2020   Prostate enlargement    History of myocardial infarction    Elbow effusion, left    Cataract    Stage 3a chronic kidney disease (CKD) ([place]) 09/08/2020   White coat syndrome with diagnosis of hypertension 03/10/2020   Stress and adjustment reaction 01/12/2020   Chronic head/ "scalp" pain 01/12/2020   Anemia due to chemical agent (HCC)-dermatologic medications most likely 12/08/2019   Erosive pustular dermatosis of scalp 12/08/2019   Benign essential tremor 12/08/2019   Vitamin D deficiency 12/08/2019   Recurrent right inguinal hernia 05/18/2019   Atypical chest pain 05/07/2019   Venous insufficiency of both lower extremities 11/27/2018   Tremor of both hands-  both wiht rest and activity; acutely worse 11/27/2018   Orthostatic hypotension 11/05/2018   Bilateral lower extremity edema 11/17/2017   Essential hypertension 08/05/2017   Microalbuminuria due to type 2 diabetes mellitus ([place]) 08/05/2017   RLS (restless legs syndrome) 06/17/2017   GERD (gastroesophageal reflux disease)  05/05/2017   Glossodynia 05/05/2017   Costochondritis 05/05/2017   Type 2 diabetes mellitus with stage 3 chronic kidney disease, without long-term current use of insulin ([place]) 05/05/2017   CAD (coronary artery disease) 03/25/2017   Snoring 03/25/2017   Urinary retention due to benign prostatic hyperplasia 01/25/2015   Primary osteoarthritis of right shoulder 01/24/2015    Palliative Care Assessment & Plan   Patient Profile:    Assessment: 87 year old gentleman admitted with suspected pneumonia, pustular dermatosis of scalp, has underlying stage III chronic kidney disease, history of diabetes and skin cancer.  Palliative medicine consulted for additional supportive care as well as continuation of goals of care discussions  Discussion/recommendations/Plan: Comfort measures only Continue current pain and known pain symptom management medications Continue comfort feedings Discharge back to facility with hospice once arrangements are made.    Goals of Care and Additional Recommendations: Comfort measures as of 05-17-22.   Code Status:    Code Status Orders  (From admission, onward)           Start     Ordered   05/15/22 1153  Do not attempt resuscitation (DNR)  Continuous       Question Answer Comment  In the event of cardiac or respiratory ARREST Do not call a "code blue"   In the event of cardiac or respiratory ARREST Do not perform Intubation, CPR, defibrillation or ACLS   In the event of cardiac or respiratory ARREST Use medication by any route, position, wound care, and other measures to relive pain and suffering. May use oxygen, suction and manual treatment of airway obstruction as needed for comfort.      05/15/22 1152           Code Status History     Date Active Date Inactive Code Status Order ID Comments User Context   05/13/2022 0619 05/15/2022 1152 Full Code [id number]  Rise Patience, MD Inpatient  05/18/2019 1040 05/19/2019 1213 Full Code [id number]   Erroll Luna, MD Inpatient   01/24/2015 1322 01/25/2015 1653 Full Code [id number]  Marchia Bond, MD Inpatient      Advance Directive Documentation    Flowsheet Row Most Recent Value  Type of Advance Directive Living will, Healthcare Power of Attorney  Pre-existing out of facility DNR order (yellow form or pink MOST form) --  "MOST" Form in Place? --       Prognosis:  2-3 weeks.   Discharge Planning: Back to facility with hospice,   Care plan was discussed with patient wife son and daughter, also discussed with TRH MD.   Thank you for allowing the Palliative Medicine Team to assist in the care of this patient.    MOD MDM.      Greater than 50%  of this time was spent counseling and coordinating care related to the above assessment and plan.  Loistine Chance, MD  Please contact Palliative Medicine Team phone at [phone] for questions and concerns.

## 2022-05-19 DIAGNOSIS — R531 Weakness: Secondary | ICD-10-CM | POA: Diagnosis not present

## 2022-05-19 DIAGNOSIS — L988 Other specified disorders of the skin and subcutaneous tissue: Secondary | ICD-10-CM | POA: Diagnosis not present

## 2022-05-19 DIAGNOSIS — Z515 Encounter for palliative care: Secondary | ICD-10-CM | POA: Diagnosis not present

## 2022-05-19 DIAGNOSIS — J189 Pneumonia, unspecified organism: Secondary | ICD-10-CM | POA: Diagnosis not present

## 2022-05-19 DIAGNOSIS — Z7189 Other specified counseling: Secondary | ICD-10-CM | POA: Diagnosis not present

## 2022-05-19 MED ORDER — GLYCOPYRROLATE 1 MG PO TABS
1.0000 mg | ORAL_TABLET | ORAL | Status: DC | PRN
  Administered 2022-05-19: 1 mg via ORAL
  Filled 2022-05-19 (×2): qty 1

## 2022-05-19 MED ORDER — GLYCOPYRROLATE 0.2 MG/ML IJ SOLN
0.2000 mg | INTRAMUSCULAR | Status: DC | PRN
  Filled 2022-05-19: qty 1

## 2022-05-19 MED ORDER — MORPHINE BOLUS VIA INFUSION
1.0000 mg | INTRAVENOUS | Status: DC | PRN
  Administered 2022-05-19 – 2022-05-20 (×4): 1 mg via INTRAVENOUS

## 2022-05-19 MED ORDER — GLYCOPYRROLATE 0.2 MG/ML IJ SOLN: 0.2000 mg | INTRAMUSCULAR | Status: DC | PRN

## 2022-05-19 MED ORDER — MORPHINE 100MG IN NS 100ML (1MG/ML) PREMIX INFUSION
3.0000 mg/h | INTRAVENOUS | Status: DC
  Administered 2022-05-19: 2 mg/h via INTRAVENOUS
  Filled 2022-05-19 (×2): qty 100

## 2022-05-19 NOTE — Progress Notes (Addendum)
Manufacturing engineer [facility]) Hospital Liaison Note   Following review of PMT provider/Dr. Rowe Pavy documentation. MSW inquired with [facility] IDT if it would appropriate to evaluate patient for [facility] transfer. Dr. Rowe Pavy in agreement for BP evaluation.   MSW contacted daughter/Tina and discussed above possibility of discharge. At this time, she would prefer for him to remain in the hospital and be a hospital death. She stated that he does not pass in the next 24-48 hours she would then like to discuss BP transfer if he is stable for transport  IDT notified of above information.   Please call with any questions/concerns.    Thank you for the opportunity to participate in this patient's care.   Daphene Calamity, MSW [facility] Liaison  [phone]

## 2022-05-19 NOTE — Progress Notes (Signed)
Daily Progress Note   Patient Name: George Barnett       Date: 05/19/2022 DOB: 1933/02/19  Age: 88 y.o. MRN#: [id number] Attending Physician: Elmarie Shiley, MD Primary Care Physician: Haydee Salter, MD Admit Date: 05/12/2022  Reason for Consultation/Follow-up: Establishing goals of care  Subjective:  Resting in bed, awakens easily, family at bedside, escalating symptom burden with cough, increased secretions and shortness of breath.    Length of Stay: 5  Current Medications: Scheduled Meds:   acetaminophen  500 mg Oral Q6H   acitretin  10 mg Oral Q supper   clobetasol ointment  1 Application Topical BID   feeding supplement  237 mL Oral BID BM   fluticasone  1 spray Each Nare Daily   gabapentin  100 mg Oral TID   mouth rinse  15 mL Mouth Rinse 4 times per day   pantoprazole (PROTONIX) IV  40 mg Intravenous Q12H   polyethylene glycol  17 g Oral Daily   polyvinyl alcohol  1 drop Both Eyes Daily   primidone  25 mg Oral Daily   tacrolimus  1 Application Topical BID   tamsulosin  0.4 mg Oral Daily   terbinafine   Topical Daily    Continuous Infusions:  morphine       PRN Meds: acetaminophen **OR** acetaminophen, ALPRAZolam, diphenhydrAMINE-zinc acetate, glycopyrrolate **OR** glycopyrrolate **OR** glycopyrrolate, guaiFENesin-dextromethorphan, ipratropium-albuterol, lip balm, morphine injection, morphine **AND** morphine, naphazoline-glycerin, mouth rinse, oxyCODONE, polyvinyl alcohol  Physical Exam         Appears weak Resting in bed Regular work of breathing Appears with more fatigue/generalized weakness Has dressing on his nose Scalp noted  Vital Signs: BP (!) 116/55   Pulse 68   Temp 97.8 F (36.6 C) (Oral)   Resp 18   Ht 6' (1.829 m)   Wt 64.8 kg   SpO2 (!)  88%   BMI 19.38 kg/m  SpO2: SpO2: (!) 88 % O2 Device: O2 Device: Nasal Cannula O2 Flow Rate: O2 Flow Rate (L/min): 5 L/min  Intake/output summary:  Intake/Output Summary (Last 24 hours) at 05/19/2022 0943 Last data filed at 05/19/2022 0430 Gross per 24 hour  Intake --  Output 1000 ml  Net -1000 ml    LBM: Last BM Date :  (PTA) Baseline Weight: Weight: 65.3 kg Most recent weight: Weight:  64.8 kg       Palliative Assessment/Data:      Patient Active Problem List   Diagnosis Date Noted   Palliative care by specialist    Goals of care, counseling/discussion    General weakness    Pressure injury of skin 05/16/2022   Fever 05/14/2022   CAP (community acquired pneumonia) 05/13/2022   Weakness 05/13/2022   Lower extremity weakness 05/13/2022   Squamous cell cancer of skin of nose 04/29/2022   Hyperlipidemia 01/17/2022   Basal cell carcinoma 10/29/2021   Squamous cell carcinoma of antihelix, right 11/02/2020   Prostate enlargement    History of myocardial infarction    Elbow effusion, left    Cataract    Stage 3a chronic kidney disease (CKD) ([place]) 09/08/2020   White coat syndrome with diagnosis of hypertension 03/10/2020   Stress and adjustment reaction 01/12/2020   Chronic head/ "scalp" pain 01/12/2020   Anemia due to chemical agent (HCC)-dermatologic medications most likely 12/08/2019   Erosive pustular dermatosis of scalp 12/08/2019   Benign essential tremor 12/08/2019   Vitamin D deficiency 12/08/2019   Recurrent right inguinal hernia 05/18/2019   Atypical chest pain 05/07/2019   Venous insufficiency of both lower extremities 11/27/2018   Tremor of both hands-  both wiht rest and activity; acutely worse 11/27/2018   Orthostatic hypotension 11/05/2018   Bilateral lower extremity edema 11/17/2017   Essential hypertension 08/05/2017   Microalbuminuria due to type 2 diabetes mellitus ([place]) 08/05/2017   RLS (restless legs syndrome) 06/17/2017   GERD (gastroesophageal  reflux disease) 05/05/2017   Glossodynia 05/05/2017   Costochondritis 05/05/2017   Type 2 diabetes mellitus with stage 3 chronic kidney disease, without long-term current use of insulin ([place]) 05/05/2017   CAD (coronary artery disease) 03/25/2017   Snoring 03/25/2017   Urinary retention due to benign prostatic hyperplasia 01/25/2015   Primary osteoarthritis of right shoulder 01/24/2015    Palliative Care Assessment & Plan   Patient Profile:    Assessment: 88 year old gentleman admitted with suspected pneumonia, pustular dermatosis of scalp, has underlying stage III chronic kidney disease, history of diabetes and skin cancer.  Palliative medicine consulted for additional supportive care as well as continuation of goals of care discussions  Discussion/recommendations/Plan: Comfort measures only Start Morphine infusion+bolus, agree with Robinul.  Continue comfort feedings, suspect PO intake is minimal to nil.  Ongoing decline, escalating symptom burden, anticipate hospital death.     Goals of Care and Additional Recommendations: Comfort measures as of 05-17-22.   Code Status:    Code Status Orders  (From admission, onward)           Start     Ordered   05/15/22 1153  Do not attempt resuscitation (DNR)  Continuous       Question Answer Comment  In the event of cardiac or respiratory ARREST Do not call a "code blue"   In the event of cardiac or respiratory ARREST Do not perform Intubation, CPR, defibrillation or ACLS   In the event of cardiac or respiratory ARREST Use medication by any route, position, wound care, and other measures to relive pain and suffering. May use oxygen, suction and manual treatment of airway obstruction as needed for comfort.      05/15/22 1152           Code Status History     Date Active Date Inactive Code Status Order ID Comments User Context   05/13/2022 0619 05/15/2022 1152 Full Code [id number]  Rise Patience, MD  Inpatient   05/18/2019  1040 05/19/2019 1213 Full Code [id number]  Erroll Luna, MD Inpatient   01/24/2015 1322 01/25/2015 1653 Full Code [id number]  Marchia Bond, MD Inpatient      Advance Directive Documentation    Flowsheet Row Most Recent Value  Type of Advance Directive Living will, Healthcare Power of Attorney  Pre-existing out of facility DNR order (yellow form or pink MOST form) --  "MOST" Form in Place? --       Prognosis:  2-3 weeks.   Discharge Planning: Anticipated hospital death.   Care plan was discussed with patient   and daughter, also discussed with TRH MD.   Thank you for allowing the Palliative Medicine Team to assist in the care of this patient.    MOD MDM.      Greater than 50%  of this time was spent counseling and coordinating care related to the above assessment and plan.  Loistine Chance, MD  Please contact Palliative Medicine Team phone at [phone] for questions and concerns.

## 2022-05-19 NOTE — Progress Notes (Addendum)
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  Neuro exam is nonfocal.  Chest x-ray showed left lower lobe atelectasis.  COVID and influenza negative.  Admitted with persistent fevers and symptoms.  Patient continued to spike fever despite being on Tylenol.  ID consulted for further evaluation of scalp dermatosis and concern for superimposed infection.  Patient develops Worsening respiratory failure overnight 6/29. He required up to 15 L oxygen. He received 2 doses of IV lasix which improved urine out put. Chest x ray is concerning with worsening multifocal PNA> Antibiotics change to Unasyn. Speech therapy evaluation. Oxygen requirement this morning has decreased to 6 L.   Patient desire for comfort care, he has been transition to hospice care. Patient having more cough, SOB. He was started on Morphine Gtt, full comfort care. Expect Hospital Death. Reassess in 24-48 for residential Hospice placement.     Assessment & Plan:   Principal Problem:   CAP (community acquired pneumonia) Active Problems:   Type 2 diabetes mellitus with stage 3 chronic kidney disease, without long-term current use of insulin (HCC)   Erosive pustular dermatosis of scalp   Tremor of both hands-  both wiht rest and activity; acutely worse   Stage 3a chronic kidney disease (CKD) (HCC)   Squamous cell cancer of skin of nose   Weakness   Lower extremity weakness   Fever   Pressure injury of skin   Palliative care by specialist   Goals of care, counseling/discussion   General weakness  1-Acute Hypoxic Respiratory failure. Aspiration PNA>  Suspect related to PNA, component of pulmonary  edema after he received IV fluids. More likely aspiration PNA> Continue to require 6 L oxygen.  ECHO normal EF.  Schedule Nebulizer BID>  Patient does not wishes to proceed with further procedure MBS concern for esophageal dysphagia. Retention fluid esophagus.  Transition to full comfort care.  Started robinul for secretions.  Started morphine gtt.  Expect hospital death, if stabilized in next 48 hours will evaluate for residential hospice.   2-Esophageal Dysphagia;  MBS concern of fluid in the esophagus.  Started IV Protonix BID>  Patient does not wishes to proceed with further procedure. He has transition to comfort care.   3-Multifocal PNA;  Also concern for superimposed cellulitis of the scalp Sepsis ruled out , patient was only febrile, no tachycardia, no tachypnea, no Hypotension. No leukocytosis.  ID consulted and following.  Azithromycin changed to doxycycline to cover for cellulitis of the scalp Blood cultures negative  Lactic acid at 2. . Received IV bolus 6/28..  Treated  with Unasyn.  CT chest: widespread ground glass and peripheral consolidative opacities.   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 He follows at [facility] with dermatologist. Attending physician on 6/27 spoke with dermatologist from [facility]. Recommendation was to continue acitretin, as this is less likely to contribute to current presentation. Plan to discontinue dapsone as it may cause weakness. She recommended continued topical treatment with tacrolimus and clobetasol. Patient does not wishes at this point to continue with topical treatment as it is very painful.  I offered oxycodone as needed for pain. ID consulted for evaluation of ?  Superimpose cellulitis. Azitro change to Doxycycline.  Wound care consulted appreciate recommendations.   4-Chronic iron deficiency anemia: Stable  Diabetes type 2: Continue with a  sliding scale.  Hold metformin and Jardiance.  Bilateral lower extremity weakness: Suspect related to debility and generalized weakness. He was able to ambulate in the hall.  No significant focal weakness. MRI thoracic and lumbar spine reviewed with neurosurgeon on-call, no significant nerve  encroachment, compression.  Continue with PT>   Prostatism;  He had some urinary retention. Started on Flomax.  Continue with bladder scan.   AKI on CKD stage IIIa Hold Cozaar. Prior cr per record at 1.4.  Strict I and . Bladder scan.  Renal function stable at 1.5  Hyponatremia; Received IV fluids.    Essential tremors, continue with primidone   Skin cancer: Has a skin graft procedure done, do not remove dressing. Wound care consulted Pressure injury right and left mid buttock  stage 1;present on admission; local care  Nutrition Problem: Increased nutrient needs Etiology: acute illness    Signs/Symptoms: estimated needs    Interventions: Ensure Enlive (each supplement provides 350kcal and 20 grams of protein), MVI, Liberalize Diet  Estimated body mass index is 19.38 kg/m as calculated from the following:   Height as of this encounter: 6' (1.829 m).   Weight as of this encounter: 64.8 kg.   DVT prophylaxis:  Code Status: DNR. Family Communication: Daughter at bedside.  Disposition Plan:  Status is: Inpatient Remains inpatient appropriate because: fever    Consultants:  ID  Procedures:  None  Antimicrobials:    Subjective: He has been having frequent secretion, daughter has had to suction him multiples times. He has been more SOB. Daughter worry about if facility will be able to provide care patient required, like morphine IV for worsening respiratory failure. Palliative will follow up on patient today  Objective: Vitals:   05/18/22 1000 05/18/22 1100 05/18/22 1516 05/19/22 1333  BP:   (!) 116/55 129/65  Pulse: 67 65 68 65  Resp: '18 16 18 18  '$ Temp:   97.8 F  (36.6 C) 99.4 F (37.4 C)  TempSrc:   Oral Axillary  SpO2: 93% 94% (!) 88% 94%  Weight:      Height:        Intake/Output Summary (Last 24 hours) at 05/19/2022 1620 Last data filed at 05/19/2022 1400 Gross per 24 hour  Intake --  Output 1550 ml  Net -1550 ml    Filed Weights   05/12/22 2203 05/13/22 0510 05/16/22 0213  Weight: 65.3 kg 63.2 kg 64.8 kg    Examination:  General exam: NAD Respiratory system: BL ronchus.  Cardiovascular system: S 1, S 2 RRR Gastrointestinal system: BS present, soft, nt Central nervous system: sleepy, wake up and says few words.  Data Reviewed: I have personally reviewed following labs and imaging studies  CBC: Recent Labs  Lab 05/12/22 2326 05/13/22 0552 05/13/22 0619 05/15/22 1119 05/16/22 0326 05/17/22 5465  WBC 7.4 DUPLICATE REQUEST 7.2 7.3 8.0 7.9  NEUTROABS 5.4 DUPLICATE REQUEST 4.7  --   --   --   HGB 68.1* DUPLICATE REQUEST 27.5* 10.4* 10.9* 17.0*  HCT 01.7* DUPLICATE REQUEST 49.4* 30.9* 31.8* 32.7*  MCV 496.7* DUPLICATE REQUEST 591.6* 103.0* 101.3* 384.6*  PLT 659 DUPLICATE REQUEST [phone]    Basic Metabolic Panel: Recent Labs  Lab 05/12/22 2326 05/13/22 0552 05/15/22 1119 05/16/22 0326 05/17/22 0331  NA 138 138 133* 137 139  K 4.4 4.2 4.3  4.3 4.4  CL 102 103 97* 99 101  CO2 '27 27 27 29 27  '$ GLUCOSE 159* 120* 270* 198* 155*  BUN 41* 37* 34* 30* 31*  CREATININE 1.64* 1.62* 1.61* 1.57* 1.54*  CALCIUM 9.5 8.9 9.3 9.5 9.0  MG  --   --   --  1.7  --     GFR: Estimated Creatinine Clearance: 31 mL/min (A) (by C-G formula based on SCr of 1.54 mg/dL (H)). Liver Function Tests: Recent Labs  Lab 05/12/22 2326 05/13/22 0552  AST 16 16  ALT 14 12  ALKPHOS 39 35*  BILITOT 0.7 0.6  PROT 6.0* 5.5*  ALBUMIN 3.1* 2.8*    No results for input(s): "LIPASE", "AMYLASE" in the last 168 hours. No results for input(s): "AMMONIA" in the last 168 hours. Coagulation Profile: Recent Labs  Lab 05/12/22 2326  INR 1.1     Cardiac Enzymes: Recent Labs  Lab 05/13/22 0552  CKTOTAL 32*    BNP (last 3 results) No results for input(s): "PROBNP" in the last 8760 hours. HbA1C: No results for input(s): "HGBA1C" in the last 72 hours. CBG: Recent Labs  Lab 05/16/22 1140 05/16/22 1640 05/16/22 2143 05/17/22 0750 05/17/22 1208  GLUCAP 193* 150* 140* 165* 227*    Lipid Profile: No results for input(s): "CHOL", "HDL", "LDLCALC", "TRIG", "CHOLHDL", "LDLDIRECT" in the last 72 hours. Thyroid Function Tests: No results for input(s): "TSH", "T4TOTAL", "FREET4", "T3FREE", "THYROIDAB" in the last 72 hours.  Anemia Panel: No results for input(s): "VITAMINB12", "FOLATE", "FERRITIN", "TIBC", "IRON", "RETICCTPCT" in the last 72 hours.  Sepsis Labs: Recent Labs  Lab 05/13/22 0100 05/13/22 0325 05/15/22 1119 05/15/22 1204 05/15/22 1532  PROCALCITON  --   --  0.14  --   --   LATICACIDVEN 1.1 1.0  --  2.0* 1.4     Recent Results (from the past 240 hour(s))  Urine Culture     Status: None   Collection Time: 05/12/22 11:26 PM   Specimen: In/Out Cath Urine  Result Value Ref Range Status   Specimen Description   Final    IN/OUT CATH URINE Performed at [facility], [place] [address], [place], [place] [address]    Special Requests   Final    NONE Performed at [facility], [place] [address], [place], [place] [address]    Culture   Final    NO GROWTH Performed at [facility], [place] [address], [place], [place] [address]    Report Status 05/14/2022 FINAL  Final  Resp Panel by RT-PCR (Flu A&B, Covid) Anterior Nasal Swab     Status: None   Collection Time: 05/12/22 11:26 PM   Specimen: Anterior Nasal Swab  Result Value Ref Range Status   SARS Coronavirus 2 by RT PCR NEGATIVE NEGATIVE Final    Comment: (NOTE) SARS-CoV-2 target nucleic acids are NOT DETECTED.  The SARS-CoV-2 RNA is generally detectable in upper respiratory specimens during the acute phase of  infection. The lowest concentration of SARS-CoV-2 viral copies this assay can detect is 138 copies/mL. A negative result does not preclude SARS-Cov-2 infection and should not be used as the sole basis for treatment or other patient management decisions. A negative result may occur with  improper specimen collection/handling, submission of specimen other than nasopharyngeal swab, presence of viral mutation(s) within the areas targeted by this assay, and inadequate number of viral copies(<138 copies/mL). A negative result must be combined with clinical observations, patient history, and epidemiological information. The expected  result is Negative.  Fact Sheet for Patients:  EntrepreneurPulse.com.au  Fact Sheet for Healthcare Providers:  IncredibleEmployment.be  This test is no t yet approved or cleared by the [place] FDA and  has been authorized for detection and/or diagnosis of SARS-CoV-2 by FDA under an Emergency Use Authorization (EUA). This EUA will remain  in effect (meaning this test can be used) for the duration of the COVID-19 declaration under Section 564(b)(1) of the Act, 21 U.S.C.section 360bbb-3(b)(1), unless the authorization is terminated  or revoked sooner.       Influenza A by PCR NEGATIVE NEGATIVE Final   Influenza B by PCR NEGATIVE NEGATIVE Final    Comment: (NOTE) The Xpert Xpress SARS-CoV-2/FLU/RSV plus assay is intended as an aid in the diagnosis of influenza from Nasopharyngeal swab specimens and should not be used as a sole basis for treatment. Nasal washings and aspirates are unacceptable for Xpert Xpress SARS-CoV-2/FLU/RSV testing.  Fact Sheet for Patients: EntrepreneurPulse.com.au  Fact Sheet for Healthcare Providers: IncredibleEmployment.be  This test is not yet approved or cleared by the [place] FDA and has been authorized for detection and/or diagnosis of SARS-CoV-2  by FDA under an Emergency Use Authorization (EUA). This EUA will remain in effect (meaning this test can be used) for the duration of the COVID-19 declaration under Section 564(b)(1) of the Act, 21 U.S.C. section 360bbb-3(b)(1), unless the authorization is terminated or revoked.  Performed at [facility], [place] [address], [place], [place] [address]   Blood culture (routine x 2)     Status: None   Collection Time: 05/13/22  1:00 AM   Specimen: BLOOD  Result Value Ref Range Status   Specimen Description   Final    BLOOD RIGHT ANTECUBITAL Performed at [facility] [address] [place], [place] [address]    Special Requests   Final    BOTTLES DRAWN AEROBIC AND ANAEROBIC Blood Culture results may not be optimal due to an excessive volume of blood received in culture bottles Performed at [facility] [address], [place], [place] [address]    Culture   Final    NO GROWTH 5 DAYS Performed at [facility], [place] [address], [place], [place] [address]    Report Status 05/18/2022 FINAL  Final  Blood culture (routine x 2)     Status: None   Collection Time: 05/13/22  5:52 AM   Specimen: BLOOD  Result Value Ref Range Status   Specimen Description   Final    BLOOD RIGHT ANTECUBITAL Performed at [facility] [address] [place], [place] [address]    Special Requests   Final    BOTTLES DRAWN AEROBIC AND ANAEROBIC Blood Culture adequate volume Performed at [facility] [address], [place], [place] [address]    Culture   Final    NO GROWTH 5 DAYS Performed at [facility], [place] [address], [place], [place] [address]    Report Status 05/18/2022 FINAL  Final  Respiratory (~20 pathogens) panel by PCR     Status: None   Collection Time: 05/15/22  2:01 PM   Specimen: Nasopharyngeal Swab; Respiratory  Result Value Ref Range Status   Adenovirus NOT DETECTED NOT DETECTED  Final   Coronavirus 229E NOT DETECTED NOT DETECTED Final    Comment: (NOTE) The Coronavirus on the Respiratory Panel, DOES NOT test for the novel  Coronavirus (2019 nCoV)    Coronavirus HKU1 NOT DETECTED NOT  DETECTED Final   Coronavirus NL63 NOT DETECTED NOT DETECTED Final   Coronavirus OC43 NOT DETECTED NOT DETECTED Final   Metapneumovirus NOT DETECTED NOT DETECTED Final   Rhinovirus / Enterovirus NOT DETECTED NOT DETECTED Final   Influenza A NOT DETECTED NOT DETECTED Final   Influenza B NOT DETECTED NOT DETECTED Final   Parainfluenza Virus 1 NOT DETECTED NOT DETECTED Final   Parainfluenza Virus 2 NOT DETECTED NOT DETECTED Final   Parainfluenza Virus 3 NOT DETECTED NOT DETECTED Final   Parainfluenza Virus 4 NOT DETECTED NOT DETECTED Final   Respiratory Syncytial Virus NOT DETECTED NOT DETECTED Final   Bordetella pertussis NOT DETECTED NOT DETECTED Final   Bordetella Parapertussis NOT DETECTED NOT DETECTED Final   Chlamydophila pneumoniae NOT DETECTED NOT DETECTED Final   Mycoplasma pneumoniae NOT DETECTED NOT DETECTED Final    Comment: Performed at [facility], [place] [address], [place], [place] [address]  MRSA Next Gen by PCR, Nasal     Status: None   Collection Time: 05/16/22 10:52 AM   Specimen: Urine, Clean Catch; Nasal Swab  Result Value Ref Range Status   MRSA by PCR Next Gen NOT DETECTED NOT DETECTED Final    Comment: (NOTE) The GeneXpert MRSA Assay (FDA approved for NASAL specimens only), is one component of a comprehensive MRSA colonization surveillance program. It is not intended to diagnose MRSA infection nor to guide or monitor treatment for MRSA infections. Test performance is not FDA approved in patients less than 63 years old. Performed at [facility], [place] [address], [place], [place] [address]          Radiology Studies: No results found.      Scheduled Meds:  acetaminophen  500 mg Oral Q6H   acitretin  10 mg Oral Q supper    clobetasol ointment  1 Application Topical BID   feeding supplement  237 mL Oral BID BM   fluticasone  1 spray Each Nare Daily   gabapentin  100 mg Oral TID   mouth rinse  15 mL Mouth Rinse 4 times per day   pantoprazole (PROTONIX) IV  40 mg Intravenous Q12H   polyethylene glycol  17 g Oral Daily   polyvinyl alcohol  1 drop Both Eyes Daily   primidone  25 mg Oral Daily   tacrolimus  1 Application Topical BID   tamsulosin  0.4 mg Oral Daily   terbinafine   Topical Daily   Continuous Infusions:  morphine 2 mg/hr (05/19/22 1249)     LOS: 5 days    Time spent: 35 minutes    Champ Keetch A Raliegh Scobie, MD [facility]   If 7PM-7AM, please contact night-coverage [email or web address]  05/19/2022, 4:20 PM

## 2022-05-20 DIAGNOSIS — J189 Pneumonia, unspecified organism: Secondary | ICD-10-CM | POA: Diagnosis not present

## 2022-05-20 DIAGNOSIS — Z7189 Other specified counseling: Secondary | ICD-10-CM | POA: Diagnosis not present

## 2022-05-20 DIAGNOSIS — L988 Other specified disorders of the skin and subcutaneous tissue: Secondary | ICD-10-CM | POA: Diagnosis not present

## 2022-05-20 DIAGNOSIS — R531 Weakness: Secondary | ICD-10-CM | POA: Diagnosis not present

## 2022-05-20 DIAGNOSIS — Z515 Encounter for palliative care: Secondary | ICD-10-CM | POA: Diagnosis not present

## 2022-05-20 NOTE — Discharge Summary (Signed)
Physician Discharge Summary   Patient: George Barnett MRN: [id number] DOB: 22-Mar-1933  Admit date:     05/12/2022  Discharge date: 05/20/22  Discharge Physician: Elmarie Shiley   PCP: Haydee Salter, MD   Recommendations at discharge:   Comfort care.  Transfer to [facility]idential Hospice.   Discharge Diagnoses: Principal Problem:   CAP (community acquired pneumonia) Active Problems:   Type 2 diabetes mellitus with stage 3 chronic kidney disease, without long-term current use of insulin (HCC)   Erosive pustular dermatosis of scalp   Tremor of both hands-  both wiht rest and activity; acutely worse   Stage 3a chronic kidney disease (CKD) (HCC)   Squamous cell cancer of skin of nose   Weakness   Lower extremity weakness   Fever   Pressure injury of skin   Palliative care by specialist   Goals of care, counseling/discussion   General weakness  Resolved Problems:   * No resolved hospital problems. [facility]: 88 year old with past medical history significant for diabetes, hypertension, CAD, chronic and recalcitrant erosive posterior dermatosis of the scalp on extensive treatment presented with increased weakness of both lower extremity and episode of fevers.  Neuro exam is nonfocal.  Chest x-ray showed left lower lobe atelectasis.  COVID and influenza negative.  Admitted with persistent fevers and symptoms.   Patient continued to spike fever despite being on Tylenol.  ID consulted for further evaluation of scalp dermatosis and concern for superimposed infection.   Patient develops Worsening respiratory failure overnight 6/29. He required up to 15 L oxygen. He received 2 doses of IV lasix which improved urine out put. Chest x ray is concerning with worsening multifocal PNA> Antibiotics change to Unasyn. Speech therapy evaluation. Oxygen requirement this morning has decreased to 6 L.     Patient desire for comfort care, he has been transition to hospice care. Plan to  discharge residential Hospice.   Assessment and Plan: 1-Acute Hypoxic Respiratory failure. Aspiration PNA>  Suspect related to PNA, component of pulmonary edema after he received IV fluids. More likely aspiration PNA>  Continue to require 6 L oxygen.  Hold lasix. Required IV bolus last night for hypotension.  ECHO normal EF.  Treated with   Unasyn. Now  off antibiotics, transition to comfort care.  Schedule Nebulizer BID>  MBS yesterday concern for esophageal dysphagia. Retention fluid esophagus.  Patient does not wishes to proceed with further procedure. Transition to comfort care.  Comfort care, on Morphine Gtt/ Transition to residential Hospice.   2-Esophageal Dysphagia;  MBS concern of fluid in the esophagus.  Started IV Protonix BID>  Explain to patient benefit of esophagogram to rule out stricture, mass, obstruction of esophagus, presby esophagus. Patient report feeling of getting tired of procedure and interventions, he was initially willing to proceed with esophagogram, but after discussion with GI, is better to hold on esophagogram for now until his respiratory status is more stable.  Patient does not wishes to proceed with further procedure. He has transition to comfort care.   3-Multifocal PNA;  Also concern for superimposed cellulitis of the scalp ID consulted and following.  Azithromycin changed to doxycycline to cover for cellulitis of the scalp Blood cultures negative  Lactic acid at 2. . Received IV bolus 6/28..  Treated with Unasyn. off antibiotics, comfort care.  CT chest: widespread ground glass and peripheral consolidative opacities.  Transition to comfort care.   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 He follows at [facility] with dermatologist. Attending physician on 6/27 spoke with dermatologist from [facility]. Recommendation was to continue acitretin, as this is less likely to  contribute to current presentation. Plan to discontinue dapsone as it may cause weakness. She recommended continued topical treatment with tacrolimus and clobetasol. Patient does not wishes at this point to continue with topical treatment as it is very painful.  I offered oxycodone as needed for pain. ID consulted for evaluation of ? Superimpose cellulitis. Azitro change to Doxycycline.  Wound care consulted appreciate recommendations.  -Patient has transition to comfort care. Antibiotics has been discontinue.   4-Chronic iron deficiency anemia: Stable   Diabetes type 2: Continue with a sliding scale.  Hold metformin and Jardiance.   Bilateral lower extremity weakness: Suspect related to debility and generalized weakness. He was able to ambulate in the hall.  No significant focal weakness. MRI thoracic and lumbar spine reviewed with neurosurgeon on-call, no significant nerve  encroachment, compression.  Continue with PT>    Prostatism;  He had some urinary retention. Started on Flomax.     AKI on CKD stage IIIa Hold Cozaar. Prior cr per record at 1.4.  Strict I and . Bladder scan.  Renal function stable at 1.5 Comfort care now.   Hyponatremia; Received IV fluids.     Essential tremors, continue with primidone    Skin cancer: Has a skin graft procedure done, do not remove dressing. Wound care consulted Wound care as patient allows. Comfort care now.   Pressure injury right and left mid buttock  stage 1;present on admission; local care   local care.  Nutrition Problem: Increased nutrient needs Etiology: acute illness              Consultants: palliative, ID Procedures performed: None Disposition: Long term care facility Diet recommendation:  Discharge Diet Orders (From admission, onward)     Start     Ordered   05/18/22 0000  Diet - low sodium heart healthy        05/18/22 0911           Full liquid diet DISCHARGE MEDICATION: Allergies as of 05/20/2022        Reactions   Exenatide Nausea And Vomiting   Byetta    Nadolol Other (See Comments)   Numbness in fingers   Sitagliptin Phosphate [sitagliptin] Other (See Comments)   Weight loss        Medication List     STOP taking these medications    aspirin 81 MG tablet   atorvastatin 10 MG tablet Commonly known as: LIPITOR   cephALEXin 500 MG capsule Commonly known as: KEFLEX   dapsone 25 MG tablet   folic acid 1 MG tablet Commonly known as: FOLVITE   furosemide 20 MG tablet Commonly known as: LASIX   glimepiride 1 MG tablet Commonly known as: AMARYL   ipratropium 0.03 % nasal spray Commonly known as: ATROVENT   Jardiance 25 MG Tabs tablet Generic drug: empagliflozin   losartan 25 MG tablet Commonly known as: COZAAR   metFORMIN 500 MG tablet Commonly known as: GLUCOPHAGE   methotrexate 2.5 MG tablet Commonly known as: RHEUMATREX   predniSONE 10 MG tablet Commonly known as: DELTASONE   PROBIOTIC DAILY PO   SYSTANE OP       TAKE these medications    acetaminophen 500 MG tablet Commonly known as: TYLENOL Take 1 tablet (500 mg total) by mouth every 6 (six) hours.  acetaminophen 325 MG tablet Commonly known as: TYLENOL Take 2 tablets (650 mg total) by mouth every 6 (six) hours as needed for mild pain, fever or headache.   acitretin 10 MG capsule Commonly known as: SORIATANE Take 10 mg by mouth daily before breakfast.   ALPRAZolam 0.25 MG tablet Commonly known as: XANAX Take 1 tablet (0.25 mg total) by mouth 3 (three) times daily as needed for anxiety.   clobetasol ointment 0.05 % Commonly known as: TEMOVATE 1 Application 2 (two) times daily.   fluticasone 50 MCG/ACT nasal spray Commonly known as: FLONASE Place 1 spray into both nostrils daily.   gabapentin 100 MG capsule Commonly known as: NEURONTIN Take 1 capsule (100 mg total) by mouth 3 (three) times daily. What changed: when to take this   guaiFENesin-dextromethorphan 100-10  MG/5ML syrup Commonly known as: ROBITUSSIN DM Take 5 mLs by mouth every 4 (four) hours as needed for cough (chest congestion).   ipratropium-albuterol 0.5-2.5 (3) MG/3ML Soln Commonly known as: DUONEB Take 3 mLs by nebulization every 6 (six) hours as needed.   oxyCODONE 5 MG immediate release tablet Commonly known as: Oxy IR/ROXICODONE Take 1 tablet (5 mg total) by mouth every 3 (three) hours as needed for moderate pain or breakthrough pain.   polyethylene glycol 17 g packet Commonly known as: MIRALAX / GLYCOLAX Take 17 g by mouth daily as needed for mild constipation.   polyvinyl alcohol 1.4 % ophthalmic solution Commonly known as: LIQUIFILM TEARS Place 1 drop into both eyes as needed for dry eyes.   primidone 50 MG tablet Commonly known as: MYSOLINE Take 1 tablet (50 mg total) by mouth daily. What changed: how much to take   tacrolimus 0.1 % ointment Commonly known as: PROTOPIC Apply 1 application topically 2 (two) times daily.   tamsulosin 0.4 MG Caps capsule Commonly known as: FLOMAX Take 1 capsule (0.4 mg total) by mouth daily.   terbinafine 1 % cream Commonly known as: LAMISIL Apply twice daily to lesions on the left leg until resolved.               Discharge Care Instructions  (From admission, onward)           Start     Ordered   05/20/22 0000  Discharge wound care:       Comments: Comfort care   05/20/22 1157   05/18/22 0000  Discharge wound care:       Comments: See above   05/18/22 0911            Discharge Exam: Filed Weights   05/12/22 2203 05/13/22 0510 05/16/22 0213  Weight: 65.3 kg 63.2 kg 64.8 kg   General; Alert, chronic ill appearing.  Lung; BL crackles.   Condition at discharge: fair  The results of significant diagnostics from this hospitalization (including imaging, microbiology, ancillary and laboratory) are listed below for reference.   Imaging Studies: DG Swallowing Func-Speech Pathology  Result Date:  05/17/2022 Table formatting from the original result was not included. Objective Swallowing Evaluation: Type of Study: MBS-Modified Barium Swallow Study  Patient Details Name: Collie Wernick MRN: [id number] Date of Birth: 1933/09/29 Today's Date: 05/16/2022 Time: SLP Start Time (ACUTE ONLY): 4098 -SLP Stop Time (ACUTE ONLY): 1354 SLP Time Calculation (min) (ACUTE ONLY): 26 min Past Medical History: Past Medical History: Diagnosis Date  Abdominal pain 03/25/2017  Anemia due to chemical agent (HCC)-dermatologic medications most likely 12/08/2019  Atypical chest pain 05/07/2019  Benign essential tremor 12/08/2019  Bilateral lower extremity  edema 11/17/2017  Brittle diabetes mellitus ([place]) 01/12/2020  CAD (coronary artery disease) 03/25/2017  Cancer [facility])   SKIN CANCER   Cataract   Chronic head/ "scalp" pain 01/12/2020  Per patient the pain of his scalp is like "bees stinging you all over on your head "he says the slightest touch to the area causes significant pain/discomfort  Costochondritis 05/05/2017  --->pt had mentioned costochondritis at his last office visit with his former PCP prior to transferring care to [place]:  [name], MD - 02/24/2017 10:30 AM EDT Formatting of this note may be different from the original. Jestin is a 88 y.o. male who is here for a follow up visit.  DM: He is taking metformin and glimepiride and tolerates these well. His A1C was 7.1 on recent labs.  HTN: He  Decreased activity 12/08/2019  Dehydration- mild 12/08/2019  Diabetes mellitus without complication (HCC)   Edema, peripheral 11/22/2014  Elbow effusion, left   Erosive pustular dermatosis of scalp 5/69/7948  Folliculitis- scalp; txed by Derm- Dr Jarome Matin 07/27/2018  Frequency of urination   GAD (generalized anxiety disorder) 12/10/2019  GERD (gastroesophageal reflux disease)   Glossodynia 05/05/2017  [name], MD - 11/22/2016 2:15 PM EST Formatting of this note may be different from the original. Otolaryngology Initial Consultation Note   Requesting Attending Physician: [name], MD Service Requesting Consult: No service for patient encounter.  History of Present Illness:   The patient is being seen in consultation with [name], MD for the evaluation of tongu  Heart attack [facility])   Heart disease   High risk medication use 01/15/2019  Hypertension   Hypertension associated with diabetes ([place]) 08/05/2017  Microalbuminuria due to type 2 diabetes mellitus ([place]) 08/05/2017  Mixed diabetic hyperlipidemia associated with type 2 diabetes mellitus ([place]) 08/05/2017  Orthostatic hypotension 11/05/2018  Primary osteoarthritis of right shoulder 01/24/2015  Prostate enlargement   Recurrent right inguinal hernia 05/18/2019  RLS (restless legs syndrome) 06/17/2017  Snoring 03/25/2017  Stage 3 chronic kidney disease ([place]) 04/22/2017  Stage 4 chronic kidney disease ([place]) 09/08/2020  Stress and adjustment reaction 01/12/2020  Tremor of both hands-  both wiht rest and activity; acutely worse 11/27/2018  Tremors of nervous system   Type 2 diabetes mellitus with stage 3 chronic kidney disease, without long-term current use of insulin ([place]) 05/05/2017  Urinary retention due to benign prostatic hyperplasia 01/25/2015  Venous insufficiency of both lower extremities 11/27/2018  White coat syndrome with diagnosis of hypertension 03/10/2020 Past Surgical History: Past Surgical History: Procedure Laterality Date  CARDIAC CATHETERIZATION    1999  cardio stint    HERNIA REPAIR    BILAT   INGUINAL HERNIA REPAIR  05/18/2019  INGUINAL HERNIA REPAIR Right 05/18/2019  Procedure: REPAIR RIGHT INGUINAL HERNIA WITH MESH;  Surgeon: Erroll Luna, MD;  Location: [place];  Service: General;  Laterality: Right;  MOHS SURGERY    BILAT EARS   PROSTATE BIOPSY    TOTAL SHOULDER ARTHROPLASTY Right 01/24/2015  Procedure: RIGHT TOTAL SHOULDER ARTHROPLASTY;  Surgeon: Marchia Bond, MD;  Location: [place];  Service: Orthopedics;  Laterality: Right; HPI: Per palliative note "88 year old  gentleman admitted with suspected pneumonia, pustular dermatosis of scalp, has underlying stage III chronic kidney disease, history of diabetes and skin cancer."  Pt with respiratory decline and required increased oxygen. Per notes, pt has h/o reporting soreness of the left side of his tongue, CT image negative.    CXR concerning as it shows "Increasing patchy infiltration in both  mid and lower lung zones,  likely multifocal pneumonia. Edema less likely. Small pleural effusions." Today pt on 5 Liters and stable per RN.  MBS was ordered by NP.  Subjective: pt awake in flouro chair  Recommendations for follow up therapy are one component of a multi-disciplinary discharge planning process, led by the attending physician.  Recommendations may be updated based on patient status, additional functional criteria and insurance authorization. Assessment / Plan / Recommendation   05/16/2022   2:22 PM Clinical Impressions Clinical Impression Patient presents with minimal oropharyngeal dysphagia but largely functional for his age.   No aspiration noted during MBS and pt was tested with sequential liquid boluses.  Lingual rocking observed with oral transiting of liquids.  Mild pharyngeal retention without pt awareness and intermittent trace laryngeal penetration observed due to decreased motility - epiglottic deflection, laryngeal closure.   Chin tuck posture tested to determine if helpful and he did not demonstrate penetration or aspiration with this posture.  Upon esophageal sweep, pt appeared with retention without awareness - causing SLP to suspect this may be pt's primary aspiration risk - especially given her reports issues with eructation and fullness "pointing to esophagus" x six months.   Suspect pt's DISH may cause obstructive pulmonary restriction, impairing ability for pt to cough and expectorate when/if he aspirates.  Pt advises that clears liquids are tolerated better than solids - and thus recommend full liquid  floor stock tonight and esophagram 05/17/2022 to rule out correctable source of dysphagia.  RN informed. SLP Visit Diagnosis Dysphagia, oropharyngeal phase (R13.12)     05/16/2022   2:22 PM Treatment Recommendations Treatment Recommendations Other (Comment)     05/16/2022   2:33 PM Prognosis Prognosis for Safe Diet Advancement Fair Barriers to Reach Goals Severity of deficits;Time post onset   05/16/2022   2:22 PM Diet Recommendations SLP Diet Recommendations Other (Comment) Liquid Administration via Cup;Straw Medication Administration Other (Comment) Compensations Slow rate;Small sips/bites;Other (Comment) Postural Changes Remain semi-upright after after feeds/meals (Comment);Seated upright at 90 degrees     05/16/2022   2:22 PM Other Recommendations Oral Care Recommendations Oral care QID Follow Up Recommendations Follow physician's recommendations for discharge plan and follow up therapies Assistance recommended at discharge Frequent or constant Supervision/Assistance Functional Status Assessment Patient has had a recent decline in their functional status and/or demonstrates limited ability to make significant improvements in function in a reasonable and predictable amount of time   05/16/2022   2:22 PM Frequency and Duration  Speech Therapy Frequency (ACUTE ONLY) min 1 x/week Treatment Duration 1 week     05/16/2022   2:06 PM Oral Phase Oral Phase Impaired Oral - Nectar Cup NT Oral - Nectar Straw WFL Oral - Thin Teaspoon WFL Oral - Thin Cup NT;Other (Comment) Oral - Thin Straw WFL Oral - Puree WFL Oral - Mech Soft [facility]    05/16/2022   2:12 PM Pharyngeal Phase Pharyngeal Phase Impaired Pharyngeal- Nectar Straw WFL Pharyngeal Material does not enter airway Pharyngeal- Thin Teaspoon Reduced epiglottic inversion;Pharyngeal residue - valleculae;Pharyngeal residue - pyriform;Reduced laryngeal elevation;Reduced airway/laryngeal closure Pharyngeal Material does not enter airway Pharyngeal- Thin Straw Reduced epiglottic  inversion;Reduced airway/laryngeal closure;Reduced tongue base retraction;Penetration/Aspiration during swallow;Reduced laryngeal elevation Pharyngeal Material enters airway, remains ABOVE vocal cords and not ejected out Pharyngeal- Puree WFL;Pharyngeal residue - valleculae Pharyngeal Material does not enter airway Pharyngeal- Mechanical Soft Pharyngeal residue - valleculae;WFL Pharyngeal Material does not enter airway Pharyngeal Comment Chin tuck tested to determine if prevents penetration *penetration was inconsistent*; Pt challenged  with sequential boluses of thin and did not aspirate. Cued effortful swallow completed and resulted in prolonged airway closure.  Of note pt did not cough before, during or after MBS.    05/16/2022   2:17 PM Cervical Esophageal Phase  Cervical Esophageal Phase Impaired Cervical Esophageal Comment Upon esophageal sweep, pt appeared with retention without awareness; Radiologist was not present to confirm.   Pt endorses sensing delayed clearance through his esophagus and frequent erucation. He reports this has been ongoing for approximately six months with a sudden onset.  Denies coughing associated with po intake. [name] 05/16/2022, 2:36 PM    Kathleen Lime, MS [facility] SLP Acute Rehab Services Office [phone] Pager [phone]                  CT CHEST WO CONTRAST  Result Date: 05/17/2022 CLINICAL DATA:  Fever of unknown origin. Community-acquired pneumonia. EXAM: CT CHEST WITHOUT CONTRAST TECHNIQUE: Multidetector CT imaging of the chest was performed following the standard protocol without IV contrast. RADIATION DOSE REDUCTION: This exam was performed according to the departmental dose-optimization program which includes automated exposure control, adjustment of the mA and/or kV according to patient size and/or use of iterative reconstruction technique. COMPARISON:  Chest radiograph 05/16/2022.  Chest CTA 03/07/2017. FINDINGS: Cardiovascular: Normal caliber of the thoracic  aorta. Left main, LAD, left circumflex, and right coronary artery atherosclerotic calcification. Normal heart size. No pericardial effusion. Mediastinum/Nodes: No enlarged axillary, mediastinal, or definite hilar lymph nodes are identified within limitations of noncontrast technique. Moderate gaseous distension of the proximal esophagus. Small amount of fluid in the mid esophagus. Unremarkable thyroid. Lungs/Pleura: Small bilateral pleural effusions. No pneumothorax. Hazy ground-glass opacities throughout both lungs as well as widespread patchy peripheral consolidation bilaterally. Patent central airways. Upper Abdomen: Partial coverage of a moderately distended gallbladder. 4 cm left renal cyst. Musculoskeletal: Right shoulder arthroplasty. Thoracic spondylosis. No suspicious osseous lesion. IMPRESSION: 1. Widespread ground-glass and peripheral consolidative opacities in both lungs consistent with pneumonia. 2. Small bilateral pleural effusions. Electronically Signed   By: Logan Bores M.D.   On: 05/17/2022 11:40   ECHOCARDIOGRAM COMPLETE  Result Date: 05/16/2022    ECHOCARDIOGRAM REPORT   Patient Name:   DARAN FAVARO Date of Exam: 05/16/2022 Medical Rec #:  [id number]       Height:       72.0 in Accession #:    [id number]      Weight:       142.9 lb Date of Birth:  1933-02-26       BSA:          1.847 m Patient Age:    63 years        BP:           117/42 mmHg Patient Gender: M               HR:           65 bpm. Exam Location:  Inpatient Procedure: 2D Echo, Cardiac Doppler and Color Doppler Indications:    Abnormal ECG  History:        Patient has prior history of Echocardiogram examinations, most                 recent 05/16/2021. CAD; Risk Factors:Diabetes.  Sonographer:    Jefferey Pica Referring Phys: [phone] Dean Goldner A Krystyne Tewksbury IMPRESSIONS  1. Left ventricular ejection fraction, by estimation, is 60 to 65%. The left ventricle has normal function. The left ventricle has no regional wall motion  abnormalities. Left ventricular diastolic parameters are consistent with Grade I diastolic dysfunction (impaired relaxation).  2. Right ventricular systolic function is normal. The right ventricular size is normal. There is normal pulmonary artery systolic pressure. The estimated right ventricular systolic pressure is 37.6 mmHg.  3. The mitral valve is degenerative. Trivial mitral valve regurgitation. No evidence of mitral stenosis.  4. The aortic valve is tricuspid. Aortic valve regurgitation is not visualized. Aortic valve sclerosis/calcification is present, without any evidence of aortic stenosis. Aortic valve Vmax measures 1.48 m/s.  5. Aortic dilatation noted. There is borderline dilatation of the ascending aorta, measuring 37 mm.  6. The inferior vena cava is normal in size with greater than 50% respiratory variability, suggesting right atrial pressure of 3 mmHg. FINDINGS  Left Ventricle: Left ventricular ejection fraction, by estimation, is 60 to 65%. The left ventricle has normal function. The left ventricle has no regional wall motion abnormalities. The left ventricular internal cavity size was normal in size. There is  no left ventricular hypertrophy. Left ventricular diastolic parameters are consistent with Grade I diastolic dysfunction (impaired relaxation). Normal left ventricular filling pressure. Right Ventricle: The right ventricular size is normal. No increase in right ventricular wall thickness. Right ventricular systolic function is normal. There is normal pulmonary artery systolic pressure. The tricuspid regurgitant velocity is 2.33 m/s, and  with an assumed right atrial pressure of 3 mmHg, the estimated right ventricular systolic pressure is 28.3 mmHg. Left Atrium: Left atrial size was normal in size. Right Atrium: Right atrial size was normal in size. Pericardium: There is no evidence of pericardial effusion. Mitral Valve: The mitral valve is degenerative in appearance. There is mild  calcification of the anterior mitral valve leaflet(s). Trivial mitral valve regurgitation. No evidence of mitral valve stenosis. Tricuspid Valve: The tricuspid valve is normal in structure. Tricuspid valve regurgitation is trivial. No evidence of tricuspid stenosis. Aortic Valve: The aortic valve is tricuspid. Aortic valve regurgitation is not visualized. Aortic valve sclerosis/calcification is present, without any evidence of aortic stenosis. Aortic valve peak gradient measures 8.7 mmHg. Pulmonic Valve: The pulmonic valve was normal in structure. Pulmonic valve regurgitation is not visualized. No evidence of pulmonic stenosis. Aorta: Aortic dilatation noted. There is borderline dilatation of the ascending aorta, measuring 37 mm. Venous: The inferior vena cava is normal in size with greater than 50% respiratory variability, suggesting right atrial pressure of 3 mmHg. IAS/Shunts: No atrial level shunt detected by color flow Doppler.  LEFT VENTRICLE PLAX 2D LVIDd:         4.10 cm   Diastology LVIDs:         1.30 cm   LV e' medial:    6.64 cm/s LV PW:         0.90 cm   LV E/e' medial:  9.6 LV IVS:        0.80 cm   LV e' lateral:   8.38 cm/s LVOT diam:     2.00 cm   LV E/e' lateral: 7.6 LV SV:         77 LV SV Index:   42 LVOT Area:     3.14 cm  RIGHT VENTRICLE             IVC RV S prime:     15.40 cm/s  IVC diam: 1.20 cm LEFT ATRIUM             Index        RIGHT ATRIUM  Index LA diam:        2.40 cm 1.30 cm/m   RA Area:     15.00 cm LA Vol (A2C):   32.2 ml 17.44 ml/m  RA Volume:   39.30 ml  21.28 ml/m LA Vol (A4C):   28.9 ml 15.65 ml/m LA Biplane Vol: 32.5 ml 17.60 ml/m  AORTIC VALVE                 PULMONIC VALVE AV Area (Vmax): 2.90 cm     PV Vmax:       0.89 m/s AV Vmax:        147.50 cm/s  PV Peak grad:  3.2 mmHg AV Peak Grad:   8.7 mmHg LVOT Vmax:      136.00 cm/s LVOT Vmean:     82.600 cm/s LVOT VTI:       0.246 m  AORTA Ao Root diam: 2.80 cm Ao Asc diam:  3.70 cm MITRAL VALVE                TRICUSPID VALVE MV Area (PHT): 2.96 cm    TR Peak grad:   21.7 mmHg MV Decel Time: 256 msec    TR Vmax:        233.00 cm/s MV E velocity: 63.60 cm/s MV A velocity: 89.30 cm/s  SHUNTS MV E/A ratio:  0.71        Systemic VTI:  0.25 m                            Systemic Diam: 2.00 cm Fransico Him MD Electronically signed by Fransico Him MD Signature Date/Time: 05/16/2022/1:05:32 PM    Final    DG CHEST PORT 1 VIEW  Result Date: 05/16/2022 CLINICAL DATA:  Increasing shortness of breath EXAM: PORTABLE CHEST 1 VIEW COMPARISON:  05/12/2022 FINDINGS: Heart size and pulmonary vascularity are normal. Patchy infiltrates in both mid and lower lung zones, increasing since prior study, likely multifocal pneumonia. Edema less likely. Probable small pleural effusions. No pneumothorax. Degenerative changes in the spine. Postoperative change in the right shoulder. IMPRESSION: Increasing patchy infiltration in both mid and lower lung zones, likely multifocal pneumonia. Edema less likely. Small pleural effusions. Electronically Signed   By: Lucienne Capers M.D.   On: 05/16/2022 01:31   MR LUMBAR SPINE WO CONTRAST  Result Date: 05/13/2022 CLINICAL DATA:  Low back pain, cauda equina syndrome suspected; Ataxia, nontraumatic, thoracic pathology suspected EXAM: MRI THORACIC AND LUMBAR SPINE WITHOUT CONTRAST TECHNIQUE: Multiplanar and multiecho pulse sequences of the thoracic and lumbar spine were obtained without intravenous contrast. COMPARISON:  CT 04/24/2017 FINDINGS: MRI THORACIC SPINE FINDINGS Alignment:  Somewhat exaggerated kyphosis.  No listhesis. Vertebrae:  No fracture, evidence of discitis, or bone lesion. Cord: No abnormal cord signal. Paraspinal and other soft tissues: Small bilateral pleural effusions. Disc levels: No large disc herniation. There is ligamentum flavum hypertrophy at T10-T11 and T11-T12 which results in mild spinal canal narrowing at T10-T11 and minimal left posterior canal narrowing at T11-T12. There  is no significant neural foraminal stenosis at any level. MRI LUMBAR SPINE FINDINGS Segmentation:  Standard. Alignment:  Trace degenerative retrolisthesis at L5-S1. Vertebrae: No fracture, evidence of discitis, or aggressive bone lesion. Conus medullaris and cauda equina: Conus extends to the L1 level. Conus and cauda equina appear normal. Paraspinal and other soft tissues: Partially visualized left renal cyst which appears simple on prior CT in 2018. Lower paraspinal muscle atrophy. Disc levels: T12-L1:  No significant stenosis. L1-L2: Mild disc bulging and bilateral facet arthropathy. No significant stenosis. L2-L3: Minimal foraminal disc bulging and bilateral facet arthropathy. No significant stenosis. L3-L4: Mild foraminal disc bulging, ligamentum hypertrophy and moderate bilateral facet arthropathy. There is moderate bilateral subarticular stenosis encroaching the descending L4 nerve roots (series 10, image 25). No significant neural foraminal stenosis. L4-L5: Mild disc bulging, ligamentum flavum hypertrophy and mild facet arthropathy. There is moderate, right greater than left subarticular stenosis, encroaching the bilateral descending L5 nerve roots (series 10, image 32). No significant neural foraminal stenosis. L5-S1: Severe disc height loss with partial bony fusion and posterior endplate spurring. Moderate bilateral facet arthropathy. There is mild to moderate right and moderate left neural foraminal stenosis. No spinal canal stenosis. IMPRESSION: MR THORACIC SPINE IMPRESSION Mild spinal canal narrowing at T10-T11 due to prominent ligamentum flavum thickening. No large disc herniation or significant neural foraminal stenosis at any level. Small bilateral pleural effusions. MR LUMBAR SPINE IMPRESSION Multilevel degenerative changes, most prominent at L3-L4, L4-5, and L5-S1: L3-L4: Moderate bilateral subarticular stenosis encroaching the descending L4 nerve roots. L4-L5: Moderate, right greater than left  subarticular stenosis encroaching the descending L5 nerve roots. L5-S1: Mild to moderate right and moderate left neural foraminal stenosis. No spinal canal stenosis. Electronically Signed   By: Maurine Simmering M.D.   On: 05/13/2022 08:46   MR THORACIC SPINE WO CONTRAST  Result Date: 05/13/2022 CLINICAL DATA:  Low back pain, cauda equina syndrome suspected; Ataxia, nontraumatic, thoracic pathology suspected EXAM: MRI THORACIC AND LUMBAR SPINE WITHOUT CONTRAST TECHNIQUE: Multiplanar and multiecho pulse sequences of the thoracic and lumbar spine were obtained without intravenous contrast. COMPARISON:  CT 04/24/2017 FINDINGS: MRI THORACIC SPINE FINDINGS Alignment:  Somewhat exaggerated kyphosis.  No listhesis. Vertebrae:  No fracture, evidence of discitis, or bone lesion. Cord: No abnormal cord signal. Paraspinal and other soft tissues: Small bilateral pleural effusions. Disc levels: No large disc herniation. There is ligamentum flavum hypertrophy at T10-T11 and T11-T12 which results in mild spinal canal narrowing at T10-T11 and minimal left posterior canal narrowing at T11-T12. There is no significant neural foraminal stenosis at any level. MRI LUMBAR SPINE FINDINGS Segmentation:  Standard. Alignment:  Trace degenerative retrolisthesis at L5-S1. Vertebrae: No fracture, evidence of discitis, or aggressive bone lesion. Conus medullaris and cauda equina: Conus extends to the L1 level. Conus and cauda equina appear normal. Paraspinal and other soft tissues: Partially visualized left renal cyst which appears simple on prior CT in 2018. Lower paraspinal muscle atrophy. Disc levels: T12-L1: No significant stenosis. L1-L2: Mild disc bulging and bilateral facet arthropathy. No significant stenosis. L2-L3: Minimal foraminal disc bulging and bilateral facet arthropathy. No significant stenosis. L3-L4: Mild foraminal disc bulging, ligamentum hypertrophy and moderate bilateral facet arthropathy. There is moderate bilateral  subarticular stenosis encroaching the descending L4 nerve roots (series 10, image 25). No significant neural foraminal stenosis. L4-L5: Mild disc bulging, ligamentum flavum hypertrophy and mild facet arthropathy. There is moderate, right greater than left subarticular stenosis, encroaching the bilateral descending L5 nerve roots (series 10, image 32). No significant neural foraminal stenosis. L5-S1: Severe disc height loss with partial bony fusion and posterior endplate spurring. Moderate bilateral facet arthropathy. There is mild to moderate right and moderate left neural foraminal stenosis. No spinal canal stenosis. IMPRESSION: MR THORACIC SPINE IMPRESSION Mild spinal canal narrowing at T10-T11 due to prominent ligamentum flavum thickening. No large disc herniation or significant neural foraminal stenosis at any level. Small bilateral pleural effusions. MR LUMBAR SPINE IMPRESSION Multilevel degenerative changes, most  prominent at L3-L4, L4-5, and L5-S1: L3-L4: Moderate bilateral subarticular stenosis encroaching the descending L4 nerve roots. L4-L5: Moderate, right greater than left subarticular stenosis encroaching the descending L5 nerve roots. L5-S1: Mild to moderate right and moderate left neural foraminal stenosis. No spinal canal stenosis. Electronically Signed   By: Maurine Simmering M.D.   On: 05/13/2022 08:46   DG Chest 2 View  Result Date: 05/12/2022 CLINICAL DATA:  Weakness. EXAM: CHEST - 2 VIEW COMPARISON:  Chest x-ray 03/03/2017 FINDINGS: The heart size and mediastinal contours are within normal limits. There are minimal patchy opacities in the left costophrenic angle. The lungs are otherwise clear. There is no pleural effusion or pneumothorax. Right shoulder arthroplasty is again seen. No acute fractures are identified. IMPRESSION: Minimal patchy airspace disease in the left lower lung worrisome for infection. Recommend follow-up PA and lateral chest x-ray in 4-6 weeks to confirm resolution.  Electronically Signed   By: Ronney Asters M.D.   On: 05/12/2022 23:03    Microbiology: Results for orders placed or performed during the hospital encounter of 05/12/22  Urine Culture     Status: None   Collection Time: 05/12/22 11:26 PM   Specimen: In/Out Cath Urine  Result Value Ref Range Status   Specimen Description   Final    IN/OUT CATH URINE Performed at [facility], [place] [address] [place], [place] [address]    Special Requests   Final    NONE Performed at [facility], [place] [address], [place], [place] [address]    Culture   Final    NO GROWTH Performed at [facility], [place] [address] [place], [place] [address]    Report Status 05/14/2022 FINAL  Final  Resp Panel by RT-PCR (Flu A&B, Covid) Anterior Nasal Swab     Status: None   Collection Time: 05/12/22 11:26 PM   Specimen: Anterior Nasal Swab  Result Value Ref Range Status   SARS Coronavirus 2 by RT PCR NEGATIVE NEGATIVE Final    Comment: (NOTE) SARS-CoV-2 target nucleic acids are NOT DETECTED.  The SARS-CoV-2 RNA is generally detectable in upper respiratory specimens during the acute phase of infection. The lowest concentration of SARS-CoV-2 viral copies this assay can detect is 138 copies/mL. A negative result does not preclude SARS-Cov-2 infection and should not be used as the sole basis for treatment or other patient management decisions. A negative result may occur with  improper specimen collection/handling, submission of specimen other than nasopharyngeal swab, presence of viral mutation(s) within the areas targeted by this assay, and inadequate number of viral copies(<138 copies/mL). A negative result must be combined with clinical observations, patient history, and epidemiological information. The expected result is Negative.  Fact Sheet for Patients:  EntrepreneurPulse.com.au  Fact Sheet for Healthcare Providers:   IncredibleEmployment.be  This test is no t yet approved or cleared by the [place] FDA and  has been authorized for detection and/or diagnosis of SARS-CoV-2 by FDA under an Emergency Use Authorization (EUA). This EUA will remain  in effect (meaning this test can be used) for the duration of the COVID-19 declaration under Section 564(b)(1) of the Act, 21 U.S.C.section 360bbb-3(b)(1), unless the authorization is terminated  or revoked sooner.       Influenza A by PCR NEGATIVE NEGATIVE Final   Influenza B by PCR NEGATIVE NEGATIVE Final    Comment: (NOTE) The Xpert Xpress SARS-CoV-2/FLU/RSV plus assay is intended as an aid in the diagnosis of influenza from Nasopharyngeal swab  specimens and should not be used as a sole basis for treatment. Nasal washings and aspirates are unacceptable for Xpert Xpress SARS-CoV-2/FLU/RSV testing.  Fact Sheet for Patients: EntrepreneurPulse.com.au  Fact Sheet for Healthcare Providers: IncredibleEmployment.be  This test is not yet approved or cleared by the [place] FDA and has been authorized for detection and/or diagnosis of SARS-CoV-2 by FDA under an Emergency Use Authorization (EUA). This EUA will remain in effect (meaning this test can be used) for the duration of the COVID-19 declaration under Section 564(b)(1) of the Act, 21 U.S.C. section 360bbb-3(b)(1), unless the authorization is terminated or revoked.  Performed at [facility], [place] [address], [place], [place] [address]   Blood culture (routine x 2)     Status: None   Collection Time: 05/13/22  1:00 AM   Specimen: BLOOD  Result Value Ref Range Status   Specimen Description   Final    BLOOD RIGHT ANTECUBITAL Performed at [place] [address] [place], [place] [address]    Special Requests   Final    BOTTLES DRAWN AEROBIC AND ANAEROBIC Blood Culture results may not be  optimal due to an excessive volume of blood received in culture bottles Performed at [place] [address] [place], [place] [address]    Culture   Final    NO GROWTH 5 DAYS Performed at [facility], [place] [address], [place], [place] [address]    Report Status 05/18/2022 FINAL  Final  Blood culture (routine x 2)     Status: None   Collection Time: 05/13/22  5:52 AM   Specimen: BLOOD  Result Value Ref Range Status   Specimen Description   Final    BLOOD RIGHT ANTECUBITAL Performed at [place] [address], [place], [place] [address]    Special Requests   Final    BOTTLES DRAWN AEROBIC AND ANAEROBIC Blood Culture adequate volume Performed at [place] [address], [place], [place] [address]    Culture   Final    NO GROWTH 5 DAYS Performed at [facility], [place] [address] [place], [place] [address]    Report Status 05/18/2022 FINAL  Final  Respiratory (~20 pathogens) panel by PCR     Status: None   Collection Time: 05/15/22  2:01 PM   Specimen: Nasopharyngeal Swab; Respiratory  Result Value Ref Range Status   Adenovirus NOT DETECTED NOT DETECTED Final   Coronavirus 229E NOT DETECTED NOT DETECTED Final    Comment: (NOTE) The Coronavirus on the Respiratory Panel, DOES NOT test for the novel  Coronavirus (2019 nCoV)    Coronavirus HKU1 NOT DETECTED NOT DETECTED Final   Coronavirus NL63 NOT DETECTED NOT DETECTED Final   Coronavirus OC43 NOT DETECTED NOT DETECTED Final   Metapneumovirus NOT DETECTED NOT DETECTED Final   Rhinovirus / Enterovirus NOT DETECTED NOT DETECTED Final   Influenza A NOT DETECTED NOT DETECTED Final   Influenza B NOT DETECTED NOT DETECTED Final   Parainfluenza Virus 1 NOT DETECTED NOT DETECTED Final   Parainfluenza Virus 2 NOT DETECTED NOT DETECTED Final   Parainfluenza Virus 3 NOT DETECTED NOT DETECTED Final   Parainfluenza Virus 4 NOT DETECTED NOT DETECTED Final    Respiratory Syncytial Virus NOT DETECTED NOT DETECTED Final   Bordetella pertussis NOT DETECTED NOT DETECTED Final   Bordetella Parapertussis NOT DETECTED NOT DETECTED Final   Chlamydophila pneumoniae NOT DETECTED NOT DETECTED Final   Mycoplasma  pneumoniae NOT DETECTED NOT DETECTED Final    Comment: Performed at [facility], [place] [address], [place], [place] [address]  MRSA Next Gen by PCR, Nasal     Status: None   Collection Time: 05/16/22 10:52 AM   Specimen: Urine, Clean Catch; Nasal Swab  Result Value Ref Range Status   MRSA by PCR Next Gen NOT DETECTED NOT DETECTED Final    Comment: (NOTE) The GeneXpert MRSA Assay (FDA approved for NASAL specimens only), is one component of a comprehensive MRSA colonization surveillance program. It is not intended to diagnose MRSA infection nor to guide or monitor treatment for MRSA infections. Test performance is not FDA approved in patients less than 72 years old. Performed at [facility], [place] [address], [place], [place] [address]     Labs: CBC: Recent Labs  Lab 05/15/22 1119 05/16/22 0326 05/17/22 0331  WBC 7.3 8.0 7.9  HGB 10.4* 10.9* 11.1*  HCT 30.9* 31.8* 32.7*  MCV 103.0* 101.3* 102.2*  PLT 209 236 622   Basic Metabolic Panel: Recent Labs  Lab 05/15/22 1119 05/16/22 0326 05/17/22 0331  NA 133* 137 139  K 4.3 4.3 4.4  CL 97* 99 101  CO2 '27 29 27  '$ GLUCOSE 270* 198* 155*  BUN 34* 30* 31*  CREATININE 1.61* 1.57* 1.54*  CALCIUM 9.3 9.5 9.0  MG  --  1.7  --    Liver Function Tests: No results for input(s): "AST", "ALT", "ALKPHOS", "BILITOT", "PROT", "ALBUMIN" in the last 168 hours.  CBG: Recent Labs  Lab 05/16/22 1140 05/16/22 1640 05/16/22 2143 05/17/22 0750 05/17/22 1208  GLUCAP 193* 150* 140* 165* 227*    Discharge time spent: greater than 30 minutes.  Signed: Elmarie Shiley, MD [facility] 05/20/2022

## 2022-05-20 NOTE — Progress Notes (Addendum)
Daily Progress Note   Patient Name: George Barnett       Date: 05/20/2022 DOB: [birth year]  Age: 88 y.o. MRN#: [id number] Attending Physician: Elmarie Shiley, MD Primary Care Physician: Haydee Salter, MD Admit Date: 05/12/2022  Reason for Consultation/Follow-up: Establishing goals of care  Subjective:  Resting in bed, in mild resp distress, wife and daughter at bedside, worsening dyspnea, non verbal gestures of distress/discomfort noted.      Length of Stay: 6  Current Medications: Scheduled Meds:   acetaminophen  500 mg Oral Q6H   acitretin  10 mg Oral Q supper   clobetasol ointment  1 Application Topical BID   feeding supplement  237 mL Oral BID BM   fluticasone  1 spray Each Nare Daily   gabapentin  100 mg Oral TID   mouth rinse  15 mL Mouth Rinse 4 times per day   pantoprazole (PROTONIX) IV  40 mg Intravenous Q12H   polyethylene glycol  17 g Oral Daily   polyvinyl alcohol  1 drop Both Eyes Daily   primidone  25 mg Oral Daily   tacrolimus  1 Application Topical BID   tamsulosin  0.4 mg Oral Daily   terbinafine   Topical Daily    Continuous Infusions:  morphine 2 mg/hr (05/19/22 1249)     PRN Meds: acetaminophen **OR** acetaminophen, ALPRAZolam, diphenhydrAMINE-zinc acetate, glycopyrrolate **OR** glycopyrrolate **OR** glycopyrrolate, guaiFENesin-dextromethorphan, ipratropium-albuterol, lip balm, morphine injection, morphine **AND** morphine, naphazoline-glycerin, mouth rinse, oxyCODONE, polyvinyl alcohol  Physical Exam         Appears weak Resting in bed Mildly labored work of breathing.  Appears with more fatigue/generalized weakness  No coolness no mottling  Vital Signs: BP 128/72 (BP Location: Right Arm)   Pulse 73   Temp 98.5 F (36.9 C) (Oral)   Resp 16    Ht 6' (1.829 m)   Wt 64.8 kg   SpO2 94%   BMI 19.38 kg/m  SpO2: SpO2: 94 % O2 Device: O2 Device: Nasal Cannula O2 Flow Rate: O2 Flow Rate (L/min): 5 L/min  Intake/output summary:  Intake/Output Summary (Last 24 hours) at 05/20/2022 1032 Last data filed at 05/20/2022 0600 Gross per 24 hour  Intake 0 ml  Output 450 ml  Net -450 ml    LBM: Last BM Date :  (PTA) Baseline Weight:  Weight: 65.3 kg Most recent weight: Weight: 64.8 kg       Palliative Assessment/Data:      Patient Active Problem List   Diagnosis Date Noted   Palliative care by specialist    Goals of care, counseling/discussion    General weakness    Pressure injury of skin 05/16/2022   Fever 05/14/2022   CAP (community acquired pneumonia) 05/13/2022   Weakness 05/13/2022   Lower extremity weakness 05/13/2022   Squamous cell cancer of skin of nose 04/29/2022   Hyperlipidemia 01/17/2022   Basal cell carcinoma 10/29/2021   Squamous cell carcinoma of antihelix, right 11/02/2020   Prostate enlargement    History of myocardial infarction    Elbow effusion, left    Cataract    Stage 3a chronic kidney disease (CKD) ([place]) 09/08/2020   White coat syndrome with diagnosis of hypertension 03/10/2020   Stress and adjustment reaction 01/12/2020   Chronic head/ "scalp" pain 01/12/2020   Anemia due to chemical agent (HCC)-dermatologic medications most likely 12/08/2019   Erosive pustular dermatosis of scalp 12/08/2019   Benign essential tremor 12/08/2019   Vitamin D deficiency 12/08/2019   Recurrent right inguinal hernia 05/18/2019   Atypical chest pain 05/07/2019   Venous insufficiency of both lower extremities 11/27/2018   Tremor of both hands-  both wiht rest and activity; acutely worse 11/27/2018   Orthostatic hypotension 11/05/2018   Bilateral lower extremity edema 11/17/2017   Essential hypertension 08/05/2017   Microalbuminuria due to type 2 diabetes mellitus ([place]) 08/05/2017   RLS (restless legs syndrome)  06/17/2017   GERD (gastroesophageal reflux disease) 05/05/2017   Glossodynia 05/05/2017   Costochondritis 05/05/2017   Type 2 diabetes mellitus with stage 3 chronic kidney disease, without long-term current use of insulin ([place]) 05/05/2017   CAD (coronary artery disease) 03/25/2017   Snoring 03/25/2017   Urinary retention due to benign prostatic hyperplasia 01/25/2015   Primary osteoarthritis of right shoulder 01/24/2015    Palliative Care Assessment & Plan   Patient Profile:    Assessment: 88 year old gentleman admitted with suspected pneumonia, pustular dermatosis of scalp, has underlying stage III chronic kidney disease, history of diabetes and skin cancer.  Palliative medicine consulted for additional supportive care as well as continuation of goals of care discussions  Discussion/recommendations/Plan: Comfort measures only Start Morphine infusion+bolus, agree with Robinul. Uptitrate comfort medications as needed, monitor for non verbal gestures of distress/discomfort such as wincing grimacing furrowing of brow and restless movements.  Continue comfort feedings, suspect PO intake is minimal to nil.  Ongoing decline, escalating symptom burden, anticipate hospital death vs residential hospice.     Goals of Care and Additional Recommendations: Comfort measures as of 05-17-22.   Code Status:    Code Status Orders  (From admission, onward)           Start     Ordered   05/15/22 1153  Do not attempt resuscitation (DNR)  Continuous       Question Answer Comment  In the event of cardiac or respiratory ARREST Do not call a "code blue"   In the event of cardiac or respiratory ARREST Do not perform Intubation, CPR, defibrillation or ACLS   In the event of cardiac or respiratory ARREST Use medication by any route, position, wound care, and other measures to relive pain and suffering. May use oxygen, suction and manual treatment of airway obstruction as needed for comfort.       05/15/22 1152  Code Status History     Date Active Date Inactive Code Status Order ID Comments User Context   05/13/2022 0619 05/15/2022 1152 Full Code [id number]  Rise Patience, MD Inpatient   05/18/2019 1040 05/19/2019 1213 Full Code [id number]  Erroll Luna, MD Inpatient   01/24/2015 1322 01/25/2015 1653 Full Code [id number]  Marchia Bond, MD Inpatient      Advance Directive Documentation    Flowsheet Row Most Recent Value  Type of Advance Directive Living will, Healthcare Power of Attorney  Pre-existing out of facility DNR order (yellow form or pink MOST form) --  "MOST" Form in Place? --       Prognosis:  Few Days.   Discharge Planning: Anticipated hospital death versus residential hospice.   Care plan was discussed with wife  and daughter, also discussed with [facility] MD TOC and hospice and RN.    Thank you for allowing the Palliative Medicine Team to assist in the care of this patient.   MOD MDM.      Greater than 50%  of this time was spent counseling and coordinating care related to the above assessment and plan.  Loistine Chance, MD  Please contact Palliative Medicine Team phone at [phone] for questions and concerns.

## 2022-05-20 NOTE — Progress Notes (Signed)
[facility] arrived to transport patient to [facility] place, '1mg'$  of Morpine IV Push was administered before transport> Morphine drip was stopped. Patient transferred from bed to stretcher with minimal discomfort. Family at patient's bedside.

## 2022-05-20 NOTE — Progress Notes (Signed)
[facility] arrived to transport patient to [facility], family at the bedside. '1mg'$  of IV Morphine was administered before transport. Morphine drip was stopped. Patient was transferred from bed to stretcher with minimal discomfort.

## 2022-05-20 NOTE — Progress Notes (Signed)
Manufacturing engineer [facility]) Hospital Liaison Note  Bed offered and accepted for transfer to [facility] today. Unit RN please call report to [phone] prior to patient leaving the unit. Please send signed DRN and paperwork with patient.   Please call with any questions or concerns. Thank you   Roselee Nova, [place] [facility] Liaison [phone]

## 2022-05-20 NOTE — TOC Transition Note (Signed)
Transition of Care [facility]) - CM/SW Discharge Note   Patient Details  Name: George Barnett MRN: [id number] Date of Birth: December 27, 1932  [facility] of [facility]) CM/SW Contact:  Lennart Pall, LCSW Phone Number: 05/20/2022, 4:27 PM   Clinical Narrative:     Pt accepted for hospice bed at [facility] and bed ready today.  PT[facility] called.  RN to call report to [phone].  No further TOC needs.  Final next level of care: [place] Barriers to Discharge: Barriers Resolved   Patient Goals and CMS Choice Patient states their goals for this hospitalization and ongoing recovery are:: Rehab      Discharge Placement                Patient to be transferred to facility by: PTAR      Discharge Plan and Services   Discharge Planning Services: CM Consult Post Acute Care Choice: [place]          DME Arranged: N/A DME Agency: NA                  Social Determinants of Health (SDOH) Interventions     Readmission Risk Interventions     No data to display
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
## 2022-05-20 NOTE — Progress Notes (Signed)
PTAR was called for update on pickup time and family was informed. Report was given to Myra at [facility].

## 2022-05-26 ENCOUNTER — Encounter: Payer: Self-pay | Admitting: Cardiology

## 2022-05-26 ENCOUNTER — Encounter: Payer: Self-pay | Admitting: Family Medicine

## 2022-06-18 DEATH — deceased

## 2022-07-30 ENCOUNTER — Ambulatory Visit: Payer: Medicare Other | Admitting: Family Medicine

## 2023-04-16 ENCOUNTER — Ambulatory Visit: Payer: Medicare Other | Admitting: Family Medicine
